# Patient Record
Sex: Male | Born: 1935 | Race: White | Hispanic: No | Marital: Married | Smoking: Former smoker
Health system: Southern US, Community
[De-identification: ages and names within clinical notes are randomized; demographics above are authoritative.]

## PROBLEM LIST (undated history)

## (undated) DIAGNOSIS — M545 Low back pain, unspecified: Secondary | ICD-10-CM

## (undated) DIAGNOSIS — M1990 Unspecified osteoarthritis, unspecified site: Secondary | ICD-10-CM

## (undated) DIAGNOSIS — I447 Left bundle-branch block, unspecified: Secondary | ICD-10-CM

## (undated) DIAGNOSIS — I5030 Unspecified diastolic (congestive) heart failure: Secondary | ICD-10-CM

## (undated) DIAGNOSIS — F329 Major depressive disorder, single episode, unspecified: Secondary | ICD-10-CM

## (undated) DIAGNOSIS — I779 Disorder of arteries and arterioles, unspecified: Secondary | ICD-10-CM

## (undated) DIAGNOSIS — I739 Peripheral vascular disease, unspecified: Secondary | ICD-10-CM

## (undated) DIAGNOSIS — C4490 Unspecified malignant neoplasm of skin, unspecified: Secondary | ICD-10-CM

## (undated) DIAGNOSIS — M67919 Unspecified disorder of synovium and tendon, unspecified shoulder: Secondary | ICD-10-CM

## (undated) DIAGNOSIS — R001 Bradycardia, unspecified: Secondary | ICD-10-CM

## (undated) DIAGNOSIS — K219 Gastro-esophageal reflux disease without esophagitis: Secondary | ICD-10-CM

## (undated) DIAGNOSIS — G5600 Carpal tunnel syndrome, unspecified upper limb: Secondary | ICD-10-CM

## (undated) DIAGNOSIS — R269 Unspecified abnormalities of gait and mobility: Secondary | ICD-10-CM

## (undated) DIAGNOSIS — N179 Acute kidney failure, unspecified: Secondary | ICD-10-CM

## (undated) DIAGNOSIS — I5032 Chronic diastolic (congestive) heart failure: Secondary | ICD-10-CM

## (undated) DIAGNOSIS — Z952 Presence of prosthetic heart valve: Secondary | ICD-10-CM

## (undated) DIAGNOSIS — E7800 Pure hypercholesterolemia, unspecified: Secondary | ICD-10-CM

## (undated) DIAGNOSIS — Z9889 Other specified postprocedural states: Secondary | ICD-10-CM

## (undated) DIAGNOSIS — M109 Gout, unspecified: Secondary | ICD-10-CM

## (undated) DIAGNOSIS — F0390 Unspecified dementia without behavioral disturbance: Secondary | ICD-10-CM

## (undated) DIAGNOSIS — G8929 Other chronic pain: Secondary | ICD-10-CM

## (undated) DIAGNOSIS — I10 Essential (primary) hypertension: Secondary | ICD-10-CM

## (undated) DIAGNOSIS — Z8673 Personal history of transient ischemic attack (TIA), and cerebral infarction without residual deficits: Secondary | ICD-10-CM

## (undated) DIAGNOSIS — R350 Frequency of micturition: Secondary | ICD-10-CM

## (undated) DIAGNOSIS — E559 Vitamin D deficiency, unspecified: Secondary | ICD-10-CM

## (undated) DIAGNOSIS — J439 Emphysema, unspecified: Secondary | ICD-10-CM

## (undated) DIAGNOSIS — F32A Depression, unspecified: Secondary | ICD-10-CM

## (undated) DIAGNOSIS — Z87891 Personal history of nicotine dependence: Secondary | ICD-10-CM

## (undated) DIAGNOSIS — I2510 Atherosclerotic heart disease of native coronary artery without angina pectoris: Secondary | ICD-10-CM

## (undated) DIAGNOSIS — R413 Other amnesia: Secondary | ICD-10-CM

## (undated) DIAGNOSIS — T4145XA Adverse effect of unspecified anesthetic, initial encounter: Secondary | ICD-10-CM

## (undated) HISTORY — PX: CARDIAC VALVE REPLACEMENT: SHX585

## (undated) HISTORY — PX: JOINT REPLACEMENT: SHX530

## (undated) HISTORY — DX: Presence of prosthetic heart valve: Z95.2

## (undated) HISTORY — DX: Disorder of arteries and arterioles, unspecified: I77.9

## (undated) HISTORY — DX: Peripheral vascular disease, unspecified: I73.9

## (undated) HISTORY — PX: BACK SURGERY: SHX140

## (undated) HISTORY — PX: ANTERIOR CERVICAL DECOMP/DISCECTOMY FUSION: SHX1161

---
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
## 1991-05-03 HISTORY — PX: MECHANICAL AORTIC VALVE REPLACEMENT: SHX2013

## 1997-09-04 ENCOUNTER — Encounter: Admission: RE | Admit: 1997-09-04 | Discharge: 1997-09-04 | Payer: Self-pay | Admitting: Family Medicine

## 1999-05-11 ENCOUNTER — Encounter: Admission: RE | Admit: 1999-05-11 | Discharge: 1999-05-11 | Payer: Self-pay | Admitting: Sports Medicine

## 1999-06-07 ENCOUNTER — Encounter: Admission: RE | Admit: 1999-06-07 | Discharge: 1999-06-07 | Payer: Self-pay | Admitting: Family Medicine

## 2001-10-27 ENCOUNTER — Emergency Department (HOSPITAL_COMMUNITY): Admission: EM | Admit: 2001-10-27 | Discharge: 2001-10-27 | Payer: Self-pay | Admitting: Emergency Medicine

## 2001-10-29 ENCOUNTER — Encounter: Admission: RE | Admit: 2001-10-29 | Discharge: 2001-10-29 | Payer: Self-pay | Admitting: Family Medicine

## 2001-11-05 ENCOUNTER — Encounter: Admission: RE | Admit: 2001-11-05 | Discharge: 2001-11-05 | Payer: Self-pay | Admitting: Family Medicine

## 2002-02-01 ENCOUNTER — Encounter: Admission: RE | Admit: 2002-02-01 | Discharge: 2002-02-01 | Payer: Self-pay | Admitting: Family Medicine

## 2002-04-12 ENCOUNTER — Encounter: Admission: RE | Admit: 2002-04-12 | Discharge: 2002-04-12 | Payer: Self-pay | Admitting: Family Medicine

## 2002-10-21 ENCOUNTER — Encounter: Admission: RE | Admit: 2002-10-21 | Discharge: 2002-10-21 | Payer: Self-pay | Admitting: Sports Medicine

## 2002-10-21 ENCOUNTER — Encounter: Admission: RE | Admit: 2002-10-21 | Discharge: 2002-10-21 | Payer: Self-pay | Admitting: Family Medicine

## 2002-10-21 ENCOUNTER — Encounter: Payer: Self-pay | Admitting: Sports Medicine

## 2002-11-15 ENCOUNTER — Encounter: Admission: RE | Admit: 2002-11-15 | Discharge: 2002-11-15 | Payer: Self-pay | Admitting: Family Medicine

## 2002-11-20 ENCOUNTER — Encounter: Admission: RE | Admit: 2002-11-20 | Discharge: 2002-11-20 | Payer: Self-pay | Admitting: Sports Medicine

## 2002-12-20 ENCOUNTER — Encounter: Admission: RE | Admit: 2002-12-20 | Discharge: 2002-12-20 | Payer: Self-pay | Admitting: Family Medicine

## 2002-12-30 ENCOUNTER — Encounter: Admission: RE | Admit: 2002-12-30 | Discharge: 2002-12-30 | Payer: Self-pay | Admitting: Family Medicine

## 2003-01-01 HISTORY — PX: CARPAL TUNNEL RELEASE: SHX101

## 2003-01-07 ENCOUNTER — Encounter: Admission: RE | Admit: 2003-01-07 | Discharge: 2003-01-07 | Payer: Self-pay | Admitting: Family Medicine

## 2003-01-14 ENCOUNTER — Encounter: Admission: RE | Admit: 2003-01-14 | Discharge: 2003-01-14 | Payer: Self-pay | Admitting: Family Medicine

## 2003-01-28 ENCOUNTER — Encounter: Payer: Self-pay | Admitting: Orthopedic Surgery

## 2003-01-28 ENCOUNTER — Encounter: Admission: RE | Admit: 2003-01-28 | Discharge: 2003-01-28 | Payer: Self-pay | Admitting: Orthopedic Surgery

## 2003-01-30 ENCOUNTER — Ambulatory Visit (HOSPITAL_BASED_OUTPATIENT_CLINIC_OR_DEPARTMENT_OTHER): Admission: RE | Admit: 2003-01-30 | Discharge: 2003-01-30 | Payer: Self-pay | Admitting: Orthopedic Surgery

## 2003-01-30 ENCOUNTER — Ambulatory Visit (HOSPITAL_COMMUNITY): Admission: RE | Admit: 2003-01-30 | Discharge: 2003-01-30 | Payer: Self-pay | Admitting: Orthopedic Surgery

## 2003-02-21 ENCOUNTER — Encounter: Admission: RE | Admit: 2003-02-21 | Discharge: 2003-02-21 | Payer: Self-pay | Admitting: Family Medicine

## 2003-05-05 ENCOUNTER — Encounter: Admission: RE | Admit: 2003-05-05 | Discharge: 2003-05-05 | Payer: Self-pay | Admitting: Sports Medicine

## 2003-05-05 ENCOUNTER — Encounter: Admission: RE | Admit: 2003-05-05 | Discharge: 2003-05-05 | Payer: Self-pay | Admitting: Pediatrics

## 2003-06-09 ENCOUNTER — Encounter: Admission: RE | Admit: 2003-06-09 | Discharge: 2003-06-09 | Payer: Self-pay | Admitting: Family Medicine

## 2003-08-29 ENCOUNTER — Encounter: Admission: RE | Admit: 2003-08-29 | Discharge: 2003-08-29 | Payer: Self-pay | Admitting: Family Medicine

## 2003-09-05 ENCOUNTER — Encounter: Admission: RE | Admit: 2003-09-05 | Discharge: 2003-09-05 | Payer: Self-pay | Admitting: Family Medicine

## 2003-09-15 ENCOUNTER — Encounter: Admission: RE | Admit: 2003-09-15 | Discharge: 2003-09-15 | Payer: Self-pay | Admitting: Family Medicine

## 2003-09-24 ENCOUNTER — Inpatient Hospital Stay (HOSPITAL_COMMUNITY): Admission: RE | Admit: 2003-09-24 | Discharge: 2003-09-27 | Payer: Self-pay | Admitting: Neurosurgery

## 2003-09-28 ENCOUNTER — Emergency Department (HOSPITAL_COMMUNITY): Admission: EM | Admit: 2003-09-28 | Discharge: 2003-09-28 | Payer: Self-pay | Admitting: Emergency Medicine

## 2003-09-30 ENCOUNTER — Encounter: Admission: RE | Admit: 2003-09-30 | Discharge: 2003-09-30 | Payer: Self-pay | Admitting: Family Medicine

## 2003-10-06 ENCOUNTER — Encounter: Admission: RE | Admit: 2003-10-06 | Discharge: 2003-10-06 | Payer: Self-pay | Admitting: Family Medicine

## 2003-10-28 ENCOUNTER — Emergency Department (HOSPITAL_COMMUNITY): Admission: EM | Admit: 2003-10-28 | Discharge: 2003-10-29 | Payer: Self-pay | Admitting: Emergency Medicine

## 2003-11-14 ENCOUNTER — Encounter: Admission: RE | Admit: 2003-11-14 | Discharge: 2003-12-16 | Payer: Self-pay | Admitting: Neurosurgery

## 2004-02-16 ENCOUNTER — Ambulatory Visit: Payer: Self-pay | Admitting: Family Medicine

## 2004-03-02 ENCOUNTER — Encounter: Admission: RE | Admit: 2004-03-02 | Discharge: 2004-03-30 | Payer: Self-pay | Admitting: Family Medicine

## 2004-03-03 ENCOUNTER — Ambulatory Visit: Payer: Self-pay | Admitting: *Deleted

## 2004-03-12 ENCOUNTER — Ambulatory Visit: Payer: Self-pay | Admitting: Cardiology

## 2004-04-02 ENCOUNTER — Ambulatory Visit: Payer: Self-pay | Admitting: Cardiology

## 2004-04-05 ENCOUNTER — Ambulatory Visit: Payer: Self-pay | Admitting: Family Medicine

## 2004-04-30 ENCOUNTER — Ambulatory Visit: Payer: Self-pay | Admitting: Internal Medicine

## 2004-05-10 ENCOUNTER — Ambulatory Visit: Payer: Self-pay | Admitting: Family Medicine

## 2004-05-28 ENCOUNTER — Ambulatory Visit: Payer: Self-pay | Admitting: Cardiology

## 2004-06-16 ENCOUNTER — Ambulatory Visit: Payer: Self-pay | Admitting: Cardiovascular Disease

## 2004-06-30 ENCOUNTER — Ambulatory Visit: Payer: Self-pay | Admitting: Cardiology

## 2004-07-09 ENCOUNTER — Ambulatory Visit: Payer: Self-pay | Admitting: Family Medicine

## 2004-07-21 ENCOUNTER — Ambulatory Visit: Payer: Self-pay | Admitting: Cardiology

## 2004-08-06 ENCOUNTER — Ambulatory Visit: Payer: Self-pay | Admitting: Family Medicine

## 2004-08-11 ENCOUNTER — Ambulatory Visit: Payer: Self-pay | Admitting: Internal Medicine

## 2004-08-25 ENCOUNTER — Ambulatory Visit: Payer: Self-pay | Admitting: Cardiology

## 2004-09-03 ENCOUNTER — Ambulatory Visit: Payer: Self-pay | Admitting: Family Medicine

## 2004-09-14 ENCOUNTER — Ambulatory Visit: Payer: Self-pay | Admitting: Family Medicine

## 2004-09-22 ENCOUNTER — Ambulatory Visit: Payer: Self-pay | Admitting: *Deleted

## 2004-10-20 ENCOUNTER — Ambulatory Visit: Payer: Self-pay | Admitting: *Deleted

## 2004-11-10 ENCOUNTER — Ambulatory Visit: Payer: Self-pay | Admitting: Cardiology

## 2004-12-08 ENCOUNTER — Ambulatory Visit: Payer: Self-pay | Admitting: Cardiology

## 2004-12-29 ENCOUNTER — Ambulatory Visit: Payer: Self-pay | Admitting: *Deleted

## 2005-01-11 ENCOUNTER — Ambulatory Visit: Payer: Self-pay | Admitting: Cardiology

## 2005-01-26 ENCOUNTER — Ambulatory Visit: Payer: Self-pay | Admitting: Cardiology

## 2005-01-31 ENCOUNTER — Encounter: Admission: RE | Admit: 2005-01-31 | Discharge: 2005-01-31 | Payer: Self-pay | Admitting: Family Medicine

## 2005-01-31 ENCOUNTER — Ambulatory Visit: Payer: Self-pay | Admitting: Family Medicine

## 2005-02-23 ENCOUNTER — Ambulatory Visit: Payer: Self-pay | Admitting: Cardiology

## 2005-03-23 ENCOUNTER — Ambulatory Visit: Payer: Self-pay | Admitting: Cardiology

## 2005-03-23 ENCOUNTER — Ambulatory Visit: Payer: Self-pay | Admitting: Family Medicine

## 2005-04-06 ENCOUNTER — Ambulatory Visit: Payer: Self-pay | Admitting: *Deleted

## 2005-04-15 ENCOUNTER — Ambulatory Visit: Payer: Self-pay | Admitting: Family Medicine

## 2005-04-27 ENCOUNTER — Ambulatory Visit: Payer: Self-pay | Admitting: *Deleted

## 2005-05-25 ENCOUNTER — Ambulatory Visit: Payer: Self-pay | Admitting: Cardiology

## 2005-06-21 ENCOUNTER — Ambulatory Visit: Payer: Self-pay | Admitting: Cardiology

## 2005-06-22 ENCOUNTER — Ambulatory Visit: Payer: Self-pay | Admitting: Cardiology

## 2005-07-06 ENCOUNTER — Ambulatory Visit: Payer: Self-pay | Admitting: Internal Medicine

## 2005-07-19 ENCOUNTER — Encounter: Admission: RE | Admit: 2005-07-19 | Discharge: 2005-07-19 | Payer: Self-pay | Admitting: Neurology

## 2005-07-27 ENCOUNTER — Ambulatory Visit: Payer: Self-pay | Admitting: *Deleted

## 2005-08-24 ENCOUNTER — Ambulatory Visit: Payer: Self-pay | Admitting: *Deleted

## 2005-09-21 ENCOUNTER — Ambulatory Visit: Payer: Self-pay | Admitting: Cardiology

## 2005-10-05 ENCOUNTER — Ambulatory Visit: Payer: Self-pay | Admitting: Cardiology

## 2005-10-07 ENCOUNTER — Encounter: Admission: RE | Admit: 2005-10-07 | Discharge: 2005-10-07 | Payer: Self-pay | Admitting: Sports Medicine

## 2005-10-07 ENCOUNTER — Ambulatory Visit: Payer: Self-pay | Admitting: Family Medicine

## 2005-11-01 ENCOUNTER — Ambulatory Visit: Payer: Self-pay | Admitting: Internal Medicine

## 2005-11-14 ENCOUNTER — Ambulatory Visit: Payer: Self-pay | Admitting: Cardiology

## 2005-11-30 ENCOUNTER — Ambulatory Visit: Payer: Self-pay | Admitting: *Deleted

## 2005-12-28 ENCOUNTER — Ambulatory Visit: Payer: Self-pay | Admitting: Cardiology

## 2006-01-11 ENCOUNTER — Ambulatory Visit: Payer: Self-pay | Admitting: Cardiovascular Disease

## 2006-01-13 ENCOUNTER — Ambulatory Visit: Payer: Self-pay | Admitting: Family Medicine

## 2006-02-01 ENCOUNTER — Ambulatory Visit: Payer: Self-pay | Admitting: Internal Medicine

## 2006-03-01 ENCOUNTER — Ambulatory Visit: Payer: Self-pay | Admitting: Cardiology

## 2006-03-15 ENCOUNTER — Ambulatory Visit: Payer: Self-pay | Admitting: Cardiology

## 2006-03-21 ENCOUNTER — Ambulatory Visit (HOSPITAL_COMMUNITY): Admission: RE | Admit: 2006-03-21 | Discharge: 2006-03-21 | Payer: Self-pay | Admitting: Family Medicine

## 2006-03-21 ENCOUNTER — Ambulatory Visit: Payer: Self-pay | Admitting: Family Medicine

## 2006-03-29 ENCOUNTER — Ambulatory Visit: Payer: Self-pay | Admitting: Cardiovascular Disease

## 2006-04-17 ENCOUNTER — Ambulatory Visit: Payer: Self-pay | Admitting: Cardiology

## 2006-04-21 ENCOUNTER — Ambulatory Visit: Payer: Self-pay | Admitting: Family Medicine

## 2006-05-01 ENCOUNTER — Ambulatory Visit: Payer: Self-pay | Admitting: Cardiovascular Disease

## 2006-05-11 ENCOUNTER — Ambulatory Visit: Payer: Self-pay | Admitting: Cardiology

## 2006-05-31 ENCOUNTER — Ambulatory Visit: Payer: Self-pay | Admitting: Cardiology

## 2006-06-01 ENCOUNTER — Ambulatory Visit: Payer: Self-pay | Admitting: Cardiology

## 2006-06-01 ENCOUNTER — Ambulatory Visit: Payer: Self-pay

## 2006-06-01 ENCOUNTER — Encounter: Payer: Self-pay | Admitting: Cardiology

## 2006-06-19 ENCOUNTER — Ambulatory Visit: Payer: Self-pay | Admitting: Family Medicine

## 2006-06-19 ENCOUNTER — Encounter: Admission: RE | Admit: 2006-06-19 | Discharge: 2006-06-19 | Payer: Self-pay | Admitting: Family Medicine

## 2006-06-29 ENCOUNTER — Ambulatory Visit: Payer: Self-pay | Admitting: *Deleted

## 2006-06-29 DIAGNOSIS — M719 Bursopathy, unspecified: Secondary | ICD-10-CM

## 2006-06-29 DIAGNOSIS — G20 Parkinson's disease: Secondary | ICD-10-CM

## 2006-06-29 DIAGNOSIS — M545 Low back pain, unspecified: Secondary | ICD-10-CM

## 2006-06-29 DIAGNOSIS — M67919 Unspecified disorder of synovium and tendon, unspecified shoulder: Secondary | ICD-10-CM | POA: Insufficient documentation

## 2006-06-29 DIAGNOSIS — C4490 Unspecified malignant neoplasm of skin, unspecified: Secondary | ICD-10-CM

## 2006-06-29 DIAGNOSIS — K219 Gastro-esophageal reflux disease without esophagitis: Secondary | ICD-10-CM

## 2006-06-29 DIAGNOSIS — Z87891 Personal history of nicotine dependence: Secondary | ICD-10-CM

## 2006-06-29 DIAGNOSIS — I10 Essential (primary) hypertension: Secondary | ICD-10-CM

## 2006-06-29 DIAGNOSIS — E7800 Pure hypercholesterolemia, unspecified: Secondary | ICD-10-CM

## 2006-06-29 DIAGNOSIS — E669 Obesity, unspecified: Secondary | ICD-10-CM | POA: Insufficient documentation

## 2006-06-29 DIAGNOSIS — G5600 Carpal tunnel syndrome, unspecified upper limb: Secondary | ICD-10-CM

## 2006-06-29 DIAGNOSIS — N400 Enlarged prostate without lower urinary tract symptoms: Secondary | ICD-10-CM

## 2006-06-29 DIAGNOSIS — M109 Gout, unspecified: Secondary | ICD-10-CM

## 2006-06-29 HISTORY — DX: Low back pain, unspecified: M54.50

## 2006-06-29 HISTORY — DX: Personal history of nicotine dependence: Z87.891

## 2006-06-29 HISTORY — DX: Carpal tunnel syndrome, unspecified upper limb: G56.00

## 2006-06-29 HISTORY — DX: Gastro-esophageal reflux disease without esophagitis: K21.9

## 2006-06-29 HISTORY — DX: Gout, unspecified: M10.9

## 2006-06-29 HISTORY — DX: Unspecified disorder of synovium and tendon, unspecified shoulder: M67.919

## 2006-06-29 HISTORY — DX: Unspecified malignant neoplasm of skin, unspecified: C44.90

## 2006-07-20 ENCOUNTER — Ambulatory Visit: Payer: Self-pay | Admitting: Cardiology

## 2006-07-25 ENCOUNTER — Ambulatory Visit: Payer: Self-pay | Admitting: Cardiology

## 2006-07-25 ENCOUNTER — Encounter: Admission: RE | Admit: 2006-07-25 | Discharge: 2006-07-25 | Payer: Self-pay | Admitting: Sports Medicine

## 2006-08-01 ENCOUNTER — Ambulatory Visit: Payer: Self-pay | Admitting: *Deleted

## 2006-08-09 ENCOUNTER — Ambulatory Visit: Payer: Self-pay | Admitting: Internal Medicine

## 2006-08-23 ENCOUNTER — Ambulatory Visit: Payer: Self-pay | Admitting: *Deleted

## 2006-09-18 ENCOUNTER — Ambulatory Visit: Payer: Self-pay | Admitting: Family Medicine

## 2006-09-18 DIAGNOSIS — M159 Polyosteoarthritis, unspecified: Secondary | ICD-10-CM

## 2006-09-20 ENCOUNTER — Encounter (INDEPENDENT_AMBULATORY_CARE_PROVIDER_SITE_OTHER): Payer: Self-pay | Admitting: Family Medicine

## 2006-09-20 ENCOUNTER — Ambulatory Visit: Payer: Self-pay | Admitting: Cardiology

## 2006-09-27 ENCOUNTER — Encounter: Admission: RE | Admit: 2006-09-27 | Discharge: 2006-10-31 | Payer: Self-pay | Admitting: Family Medicine

## 2006-10-11 ENCOUNTER — Ambulatory Visit: Payer: Self-pay | Admitting: Cardiology

## 2006-10-25 ENCOUNTER — Ambulatory Visit: Payer: Self-pay | Admitting: Internal Medicine

## 2006-11-02 ENCOUNTER — Encounter: Payer: Self-pay | Admitting: Family Medicine

## 2006-11-15 ENCOUNTER — Ambulatory Visit: Payer: Self-pay | Admitting: Internal Medicine

## 2006-11-17 ENCOUNTER — Telehealth: Payer: Self-pay | Admitting: *Deleted

## 2006-12-04 ENCOUNTER — Ambulatory Visit: Payer: Self-pay | Admitting: Internal Medicine

## 2006-12-15 ENCOUNTER — Ambulatory Visit: Payer: Self-pay | Admitting: Internal Medicine

## 2006-12-28 ENCOUNTER — Ambulatory Visit: Payer: Self-pay | Admitting: Cardiology

## 2007-01-25 ENCOUNTER — Ambulatory Visit: Payer: Self-pay | Admitting: Cardiology

## 2007-02-12 ENCOUNTER — Telehealth: Payer: Self-pay | Admitting: Family Medicine

## 2007-02-15 ENCOUNTER — Ambulatory Visit: Payer: Self-pay | Admitting: Cardiology

## 2007-03-14 ENCOUNTER — Ambulatory Visit: Payer: Self-pay | Admitting: Cardiology

## 2007-03-16 ENCOUNTER — Encounter: Payer: Self-pay | Admitting: Family Medicine

## 2007-04-06 ENCOUNTER — Ambulatory Visit: Payer: Self-pay | Admitting: Family Medicine

## 2007-04-06 LAB — CONVERTED CEMR LAB
Alkaline Phosphatase: 53 units/L (ref 39–117)
BUN: 41 mg/dL — ABNORMAL HIGH (ref 6–23)
CO2: 23 meq/L (ref 19–32)
Cholesterol: 163 mg/dL (ref 0–200)
Creatinine, Ser: 1.38 mg/dL (ref 0.40–1.50)
Glucose, Bld: 97 mg/dL (ref 70–99)
HCT: 43.5 % (ref 39.0–52.0)
HDL: 34 mg/dL — ABNORMAL LOW (ref >39)
Hemoglobin: 14.4 g/dL (ref 13.0–17.0)
MCHC: 33.1 g/dL (ref 30.0–36.0)
MCV: 95.4 fL (ref 78.0–100.0)
RBC: 4.56 M/uL (ref 4.22–5.81)
Total Bilirubin: 0.8 mg/dL (ref 0.3–1.2)
Total CHOL/HDL Ratio: 4.8
Total Protein: 7.4 g/dL (ref 6.0–8.3)
Triglycerides: 253 mg/dL — ABNORMAL HIGH (ref <150)
VLDL: 51 mg/dL — ABNORMAL HIGH (ref 0–40)
WBC: 7.6 10*3/uL (ref 4.0–10.5)

## 2007-04-11 ENCOUNTER — Ambulatory Visit: Payer: Self-pay | Admitting: Cardiovascular Disease

## 2007-05-10 ENCOUNTER — Ambulatory Visit: Payer: Self-pay | Admitting: Cardiology

## 2007-05-10 ENCOUNTER — Ambulatory Visit: Payer: Self-pay | Admitting: Internal Medicine

## 2007-05-14 ENCOUNTER — Telehealth: Payer: Self-pay | Admitting: Family Medicine

## 2007-05-16 ENCOUNTER — Ambulatory Visit: Payer: Self-pay | Admitting: Cardiology

## 2007-05-16 LAB — CONVERTED CEMR LAB
Albumin: 3.8 g/dL (ref 3.5–5.2)
Alkaline Phosphatase: 46 units/L (ref 39–117)
BUN: 30 mg/dL — ABNORMAL HIGH (ref 6–23)
Basophils Absolute: 0.1 10*3/uL (ref 0.0–0.1)
Creatinine, Ser: 1.4 mg/dL (ref 0.4–1.5)
Eosinophils Absolute: 0.5 10*3/uL (ref 0.0–0.6)
GFR calc Af Amer: 64 mL/min
GFR calc non Af Amer: 53 mL/min
HDL: 25.4 mg/dL — ABNORMAL LOW (ref >39.0)
Hemoglobin: 13.9 g/dL (ref 13.0–17.0)
LDL Cholesterol: 55 mg/dL (ref 0–99)
MCHC: 34.2 g/dL (ref 30.0–36.0)
MCV: 94.4 fL (ref 78.0–100.0)
Monocytes Absolute: 0.8 10*3/uL — ABNORMAL HIGH (ref 0.2–0.7)
Monocytes Relative: 10.5 % (ref 3.0–11.0)
Potassium: 5.2 meq/L — ABNORMAL HIGH (ref 3.5–5.1)
RDW: 13.6 % (ref 11.5–14.6)
Sodium: 138 meq/L (ref 135–145)
Total Bilirubin: 0.9 mg/dL (ref 0.3–1.2)
Total CHOL/HDL Ratio: 4.3

## 2007-05-25 ENCOUNTER — Ambulatory Visit: Payer: Self-pay | Admitting: Family Medicine

## 2007-05-25 DIAGNOSIS — F0390 Unspecified dementia without behavioral disturbance: Secondary | ICD-10-CM

## 2007-06-01 ENCOUNTER — Encounter: Payer: Self-pay | Admitting: Family Medicine

## 2007-06-07 ENCOUNTER — Ambulatory Visit: Payer: Self-pay | Admitting: Cardiology

## 2007-06-22 ENCOUNTER — Ambulatory Visit: Payer: Self-pay | Admitting: Family Medicine

## 2007-07-04 ENCOUNTER — Telehealth: Payer: Self-pay | Admitting: *Deleted

## 2007-07-05 ENCOUNTER — Ambulatory Visit: Payer: Self-pay | Admitting: Cardiovascular Disease

## 2007-07-30 ENCOUNTER — Encounter: Payer: Self-pay | Admitting: Family Medicine

## 2007-08-02 ENCOUNTER — Ambulatory Visit: Payer: Self-pay | Admitting: Internal Medicine

## 2007-08-17 ENCOUNTER — Telehealth: Payer: Self-pay | Admitting: Family Medicine

## 2007-08-30 ENCOUNTER — Ambulatory Visit: Payer: Self-pay | Admitting: Cardiology

## 2007-09-25 ENCOUNTER — Encounter: Payer: Self-pay | Admitting: Family Medicine

## 2007-09-26 ENCOUNTER — Encounter: Payer: Self-pay | Admitting: Family Medicine

## 2007-09-27 ENCOUNTER — Ambulatory Visit: Payer: Self-pay | Admitting: Cardiology

## 2007-10-08 ENCOUNTER — Ambulatory Visit: Payer: Self-pay | Admitting: Cardiology

## 2007-11-05 ENCOUNTER — Ambulatory Visit: Payer: Self-pay | Admitting: Internal Medicine

## 2007-11-13 LAB — CONVERTED CEMR LAB
PSA: 0.67 ng/mL
PSA: 0.67 ng/mL
PSA: 0.67 ng/mL
PSA: 0.67 ng/mL

## 2007-11-19 ENCOUNTER — Telehealth: Payer: Self-pay | Admitting: *Deleted

## 2007-12-03 ENCOUNTER — Ambulatory Visit: Payer: Self-pay | Admitting: Cardiology

## 2007-12-05 ENCOUNTER — Ambulatory Visit: Payer: Self-pay | Admitting: Family Medicine

## 2007-12-14 ENCOUNTER — Encounter: Payer: Self-pay | Admitting: Family Medicine

## 2007-12-14 LAB — CONVERTED CEMR LAB
OCCULT 1: NEGATIVE
OCCULT 2: NEGATIVE

## 2007-12-31 ENCOUNTER — Ambulatory Visit: Payer: Self-pay | Admitting: Cardiology

## 2008-01-15 ENCOUNTER — Ambulatory Visit: Payer: Self-pay | Admitting: Cardiology

## 2008-01-17 ENCOUNTER — Encounter: Payer: Self-pay | Admitting: Family Medicine

## 2008-01-22 ENCOUNTER — Encounter: Payer: Self-pay | Admitting: Family Medicine

## 2008-01-22 ENCOUNTER — Ambulatory Visit: Payer: Self-pay

## 2008-01-28 ENCOUNTER — Ambulatory Visit: Payer: Self-pay | Admitting: Cardiovascular Disease

## 2008-02-07 ENCOUNTER — Encounter: Admission: RE | Admit: 2008-02-07 | Discharge: 2008-02-07 | Payer: Self-pay | Admitting: General Surgery

## 2008-02-15 ENCOUNTER — Ambulatory Visit: Payer: Self-pay | Admitting: Cardiology

## 2008-02-25 ENCOUNTER — Ambulatory Visit: Payer: Self-pay | Admitting: Cardiology

## 2008-03-04 ENCOUNTER — Ambulatory Visit: Payer: Self-pay | Admitting: Internal Medicine

## 2008-03-20 ENCOUNTER — Encounter: Payer: Self-pay | Admitting: Family Medicine

## 2008-04-01 ENCOUNTER — Ambulatory Visit: Payer: Self-pay | Admitting: Cardiovascular Disease

## 2008-04-01 ENCOUNTER — Telehealth: Payer: Self-pay | Admitting: Family Medicine

## 2008-04-29 ENCOUNTER — Ambulatory Visit: Payer: Self-pay | Admitting: Cardiovascular Disease

## 2008-05-23 ENCOUNTER — Telehealth: Payer: Self-pay | Admitting: *Deleted

## 2008-05-27 ENCOUNTER — Ambulatory Visit: Payer: Self-pay | Admitting: Cardiology

## 2008-06-11 ENCOUNTER — Ambulatory Visit: Payer: Self-pay | Admitting: Family Medicine

## 2008-06-12 LAB — CONVERTED CEMR LAB
AST: 23 units/L (ref 0–37)
Albumin: 4.4 g/dL (ref 3.5–5.2)
Alkaline Phosphatase: 49 units/L (ref 39–117)
Glucose, Bld: 89 mg/dL (ref 70–99)
LDL Cholesterol: 167 mg/dL — ABNORMAL HIGH (ref 0–99)
MCHC: 33.9 g/dL (ref 30.0–36.0)
MCV: 93.1 fL (ref 78.0–100.0)
Platelets: 205 10*3/uL (ref 150–400)
Potassium: 5.4 meq/L — ABNORMAL HIGH (ref 3.5–5.3)
RDW: 14.3 % (ref 11.5–15.5)
Sodium: 138 meq/L (ref 135–145)
Total Bilirubin: 1 mg/dL (ref 0.3–1.2)
Total Protein: 7.5 g/dL (ref 6.0–8.3)
Triglycerides: 325 mg/dL — ABNORMAL HIGH (ref <150)
VLDL: 65 mg/dL — ABNORMAL HIGH (ref 0–40)

## 2008-06-24 ENCOUNTER — Ambulatory Visit: Payer: Self-pay | Admitting: Cardiovascular Disease

## 2008-07-08 ENCOUNTER — Ambulatory Visit: Payer: Self-pay | Admitting: Cardiology

## 2008-07-08 ENCOUNTER — Telehealth: Payer: Self-pay | Admitting: Family Medicine

## 2008-07-15 ENCOUNTER — Telehealth: Payer: Self-pay | Admitting: Family Medicine

## 2008-07-24 ENCOUNTER — Ambulatory Visit: Payer: Self-pay | Admitting: Family Medicine

## 2008-07-24 ENCOUNTER — Telehealth: Payer: Self-pay | Admitting: Family Medicine

## 2008-07-24 ENCOUNTER — Encounter: Payer: Self-pay | Admitting: Family Medicine

## 2008-07-25 LAB — CONVERTED CEMR LAB
CO2: 19 meq/L (ref 19–32)
Calcium: 9 mg/dL (ref 8.4–10.5)
Chloride: 108 meq/L (ref 96–112)
Creatinine, Ser: 1.63 mg/dL — ABNORMAL HIGH (ref 0.40–1.50)
Sodium: 138 meq/L (ref 135–145)

## 2008-07-29 ENCOUNTER — Ambulatory Visit: Payer: Self-pay | Admitting: Cardiology

## 2008-08-06 ENCOUNTER — Ambulatory Visit: Payer: Self-pay | Admitting: Family Medicine

## 2008-08-06 DIAGNOSIS — N183 Chronic kidney disease, stage 3 (moderate): Secondary | ICD-10-CM

## 2008-08-07 LAB — CONVERTED CEMR LAB
BUN: 46 mg/dL — ABNORMAL HIGH (ref 6–23)
CO2: 19 meq/L (ref 19–32)
Chloride: 110 meq/L (ref 96–112)
Creatinine, Ser: 1.87 mg/dL — ABNORMAL HIGH (ref 0.40–1.50)
Potassium: 5 meq/L (ref 3.5–5.3)

## 2008-08-12 ENCOUNTER — Ambulatory Visit: Payer: Self-pay | Admitting: Cardiology

## 2008-08-21 ENCOUNTER — Ambulatory Visit: Payer: Self-pay | Admitting: Internal Medicine

## 2008-08-27 ENCOUNTER — Ambulatory Visit: Payer: Self-pay | Admitting: Family Medicine

## 2008-08-27 LAB — CONVERTED CEMR LAB
BUN: 24 mg/dL — ABNORMAL HIGH (ref 6–23)
Chloride: 109 meq/L (ref 96–112)
Creatinine, Ser: 1.42 mg/dL (ref 0.40–1.50)
Glucose, Bld: 93 mg/dL (ref 70–99)
Potassium: 5.3 meq/L (ref 3.5–5.3)

## 2008-09-04 ENCOUNTER — Ambulatory Visit: Payer: Self-pay | Admitting: Cardiology

## 2008-09-18 ENCOUNTER — Ambulatory Visit: Payer: Self-pay | Admitting: Internal Medicine

## 2008-09-30 ENCOUNTER — Encounter: Payer: Self-pay | Admitting: *Deleted

## 2008-10-08 ENCOUNTER — Ambulatory Visit: Payer: Self-pay | Admitting: Family Medicine

## 2008-10-09 ENCOUNTER — Ambulatory Visit: Payer: Self-pay | Admitting: Cardiology

## 2008-10-20 ENCOUNTER — Encounter (INDEPENDENT_AMBULATORY_CARE_PROVIDER_SITE_OTHER): Payer: Self-pay | Admitting: Cardiology

## 2008-10-20 ENCOUNTER — Ambulatory Visit: Payer: Self-pay | Admitting: Internal Medicine

## 2008-10-20 LAB — CONVERTED CEMR LAB: POC INR: 1.9

## 2008-11-04 ENCOUNTER — Telehealth: Payer: Self-pay | Admitting: Family Medicine

## 2008-11-05 ENCOUNTER — Encounter: Payer: Self-pay | Admitting: *Deleted

## 2008-11-10 ENCOUNTER — Encounter (INDEPENDENT_AMBULATORY_CARE_PROVIDER_SITE_OTHER): Payer: Self-pay | Admitting: Cardiology

## 2008-11-10 ENCOUNTER — Ambulatory Visit: Payer: Self-pay | Admitting: Cardiology

## 2008-11-10 LAB — CONVERTED CEMR LAB
POC INR: 1.8
Prothrombin Time: 16.5 s

## 2008-11-24 ENCOUNTER — Ambulatory Visit: Payer: Self-pay | Admitting: Cardiology

## 2008-11-24 LAB — CONVERTED CEMR LAB
POC INR: 2
Prothrombin Time: 17.3 s

## 2008-12-05 ENCOUNTER — Ambulatory Visit: Payer: Self-pay | Admitting: Cardiology

## 2008-12-05 LAB — CONVERTED CEMR LAB: Prothrombin Time: 24.4 s

## 2008-12-19 ENCOUNTER — Ambulatory Visit: Payer: Self-pay | Admitting: Cardiology

## 2008-12-31 DIAGNOSIS — Z9889 Other specified postprocedural states: Secondary | ICD-10-CM

## 2008-12-31 HISTORY — DX: Other specified postprocedural states: Z98.890

## 2009-01-01 ENCOUNTER — Ambulatory Visit: Payer: Self-pay | Admitting: Cardiology

## 2009-01-09 ENCOUNTER — Ambulatory Visit: Payer: Self-pay | Admitting: Internal Medicine

## 2009-01-21 ENCOUNTER — Ambulatory Visit: Payer: Self-pay | Admitting: Family Medicine

## 2009-02-06 ENCOUNTER — Ambulatory Visit: Payer: Self-pay | Admitting: Internal Medicine

## 2009-02-10 ENCOUNTER — Telehealth: Payer: Self-pay | Admitting: *Deleted

## 2009-02-11 ENCOUNTER — Ambulatory Visit: Payer: Self-pay | Admitting: Family Medicine

## 2009-03-05 ENCOUNTER — Ambulatory Visit: Payer: Self-pay | Admitting: Internal Medicine

## 2009-03-05 LAB — CONVERTED CEMR LAB: POC INR: 2.9

## 2009-04-02 ENCOUNTER — Encounter (INDEPENDENT_AMBULATORY_CARE_PROVIDER_SITE_OTHER): Payer: Self-pay | Admitting: Cardiology

## 2009-04-02 ENCOUNTER — Ambulatory Visit: Payer: Self-pay | Admitting: Cardiovascular Disease

## 2009-04-14 ENCOUNTER — Encounter: Payer: Self-pay | Admitting: Family Medicine

## 2009-04-30 ENCOUNTER — Ambulatory Visit: Payer: Self-pay | Admitting: Internal Medicine

## 2009-05-27 ENCOUNTER — Ambulatory Visit: Payer: Self-pay | Admitting: Family Medicine

## 2009-05-28 ENCOUNTER — Ambulatory Visit: Payer: Self-pay | Admitting: Internal Medicine

## 2009-05-28 LAB — CONVERTED CEMR LAB
AST: 21 units/L (ref 0–37)
Albumin: 4.8 g/dL (ref 3.5–5.2)
BUN: 24 mg/dL — ABNORMAL HIGH (ref 6–23)
CO2: 24 meq/L (ref 19–32)
Calcium: 9.6 mg/dL (ref 8.4–10.5)
Chloride: 103 meq/L (ref 96–112)
Cholesterol: 140 mg/dL (ref 0–200)
Glucose, Bld: 92 mg/dL (ref 70–99)
HDL: 39 mg/dL — ABNORMAL LOW (ref >39)
POC INR: 3.1
Potassium: 4.8 meq/L (ref 3.5–5.3)

## 2009-06-25 ENCOUNTER — Ambulatory Visit: Payer: Self-pay | Admitting: Cardiology

## 2009-06-25 ENCOUNTER — Encounter: Payer: Self-pay | Admitting: Family Medicine

## 2009-06-26 ENCOUNTER — Telehealth (INDEPENDENT_AMBULATORY_CARE_PROVIDER_SITE_OTHER): Payer: Self-pay | Admitting: *Deleted

## 2009-07-16 ENCOUNTER — Ambulatory Visit: Payer: Self-pay | Admitting: Internal Medicine

## 2009-08-13 ENCOUNTER — Ambulatory Visit: Payer: Self-pay | Admitting: Internal Medicine

## 2009-08-28 ENCOUNTER — Ambulatory Visit: Payer: Self-pay | Admitting: Family Medicine

## 2009-09-10 ENCOUNTER — Ambulatory Visit: Payer: Self-pay | Admitting: Internal Medicine

## 2009-09-10 LAB — CONVERTED CEMR LAB: POC INR: 3

## 2009-09-14 ENCOUNTER — Telehealth: Payer: Self-pay | Admitting: *Deleted

## 2009-09-30 ENCOUNTER — Encounter: Payer: Self-pay | Admitting: Family Medicine

## 2009-10-08 ENCOUNTER — Ambulatory Visit: Payer: Self-pay | Admitting: Internal Medicine

## 2009-10-08 LAB — CONVERTED CEMR LAB: POC INR: 2.9

## 2009-10-23 ENCOUNTER — Ambulatory Visit: Payer: Self-pay | Admitting: Family Medicine

## 2009-11-05 ENCOUNTER — Ambulatory Visit: Payer: Self-pay | Admitting: Internal Medicine

## 2009-11-05 LAB — CONVERTED CEMR LAB: POC INR: 3.3

## 2009-11-09 ENCOUNTER — Telehealth: Payer: Self-pay | Admitting: Family Medicine

## 2009-11-11 ENCOUNTER — Ambulatory Visit: Payer: Self-pay | Admitting: Family Medicine

## 2009-11-20 ENCOUNTER — Ambulatory Visit: Payer: Self-pay | Admitting: Family Medicine

## 2009-11-20 DIAGNOSIS — M7660 Achilles tendinitis, unspecified leg: Secondary | ICD-10-CM

## 2009-12-03 ENCOUNTER — Ambulatory Visit: Payer: Self-pay | Admitting: Cardiovascular Disease

## 2009-12-31 ENCOUNTER — Ambulatory Visit: Payer: Self-pay | Admitting: Internal Medicine

## 2009-12-31 LAB — CONVERTED CEMR LAB: POC INR: 2.4

## 2010-01-11 ENCOUNTER — Encounter: Payer: Self-pay | Admitting: Family Medicine

## 2010-01-13 ENCOUNTER — Encounter: Payer: Self-pay | Admitting: Family Medicine

## 2010-01-18 ENCOUNTER — Encounter: Payer: Self-pay | Admitting: Cardiology

## 2010-01-19 ENCOUNTER — Encounter: Payer: Self-pay | Admitting: Cardiology

## 2010-01-20 ENCOUNTER — Ambulatory Visit: Payer: Self-pay | Admitting: Cardiology

## 2010-01-20 ENCOUNTER — Ambulatory Visit: Payer: Self-pay

## 2010-01-20 ENCOUNTER — Ambulatory Visit (HOSPITAL_COMMUNITY): Admission: RE | Admit: 2010-01-20 | Discharge: 2010-01-20 | Payer: Self-pay | Admitting: Cardiology

## 2010-01-20 ENCOUNTER — Encounter: Payer: Self-pay | Admitting: Cardiology

## 2010-01-20 DIAGNOSIS — I714 Abdominal aortic aneurysm, without rupture: Secondary | ICD-10-CM

## 2010-01-21 ENCOUNTER — Encounter: Payer: Self-pay | Admitting: Cardiology

## 2010-01-21 ENCOUNTER — Telehealth: Payer: Self-pay | Admitting: Cardiology

## 2010-01-25 ENCOUNTER — Telehealth (INDEPENDENT_AMBULATORY_CARE_PROVIDER_SITE_OTHER): Payer: Self-pay | Admitting: *Deleted

## 2010-01-28 ENCOUNTER — Ambulatory Visit: Payer: Self-pay | Admitting: Cardiology

## 2010-02-01 ENCOUNTER — Ambulatory Visit (HOSPITAL_BASED_OUTPATIENT_CLINIC_OR_DEPARTMENT_OTHER): Admission: RE | Admit: 2010-02-01 | Discharge: 2010-02-02 | Payer: Self-pay | Admitting: Urology

## 2010-02-01 HISTORY — PX: TRANSURETHRAL RESECTION OF PROSTATE: SHX73

## 2010-02-10 ENCOUNTER — Ambulatory Visit: Payer: Self-pay | Admitting: Cardiology

## 2010-02-24 ENCOUNTER — Ambulatory Visit: Payer: Self-pay | Admitting: Internal Medicine

## 2010-02-25 ENCOUNTER — Encounter: Payer: Self-pay | Admitting: Family Medicine

## 2010-03-10 ENCOUNTER — Ambulatory Visit: Payer: Self-pay | Admitting: Cardiovascular Disease

## 2010-03-10 LAB — CONVERTED CEMR LAB: POC INR: 2.9

## 2010-03-17 ENCOUNTER — Ambulatory Visit: Payer: Self-pay | Admitting: Family Medicine

## 2010-04-01 ENCOUNTER — Ambulatory Visit: Payer: Self-pay | Admitting: Cardiovascular Disease

## 2010-04-05 ENCOUNTER — Telehealth: Payer: Self-pay | Admitting: *Deleted

## 2010-04-14 ENCOUNTER — Encounter: Payer: Self-pay | Admitting: Family Medicine

## 2010-04-22 ENCOUNTER — Encounter: Payer: Self-pay | Admitting: Cardiology

## 2010-04-27 ENCOUNTER — Ambulatory Visit: Payer: Self-pay | Admitting: Cardiology

## 2010-04-29 ENCOUNTER — Ambulatory Visit: Payer: Self-pay | Admitting: Cardiology

## 2010-05-05 ENCOUNTER — Telehealth: Payer: Self-pay | Admitting: Family Medicine

## 2010-05-06 ENCOUNTER — Encounter: Payer: Self-pay | Admitting: Family Medicine

## 2010-05-11 ENCOUNTER — Observation Stay (HOSPITAL_COMMUNITY)
Admission: RE | Admit: 2010-05-11 | Discharge: 2010-05-12 | Payer: Self-pay | Source: Home / Self Care | Attending: Orthopedic Surgery | Admitting: Orthopedic Surgery

## 2010-05-11 ENCOUNTER — Encounter: Payer: Self-pay | Admitting: Cardiology

## 2010-05-17 ENCOUNTER — Ambulatory Visit: Admission: RE | Admit: 2010-05-17 | Discharge: 2010-05-17 | Payer: Self-pay | Source: Home / Self Care

## 2010-05-17 LAB — PROTIME-INR
INR: 1.2 (ref 0.00–1.49)
Prothrombin Time: 15.4 seconds — ABNORMAL HIGH (ref 11.6–15.2)

## 2010-05-17 LAB — APTT: aPTT: 44 seconds — ABNORMAL HIGH (ref 24–37)

## 2010-05-24 NOTE — Discharge Summary (Addendum)
[name], [name]:  [id number]  MEDICAL RECORD NO.:  [id number]          PATIENT TYPE:  INP  LOCATION:  1319                         FACILITY:  [facility]  PHYSICIAN:  Georges Lynch. Laronn Devonshire, M.D.DATE OF BIRTH:  December 17, 1931  DATE OF ADMISSION:  05/11/2010 DATE OF DISCHARGE:  05/12/2010                              DISCHARGE SUMMARY   ADMITTING DIAGNOSES:  Severely retracted left rotator cuff tear.  DISCHARGE DIAGNOSES:  Left rotator cuff tear, status post repair of left rotator cuff.  LABORATORY DATA:  Preoperative CBC revealed white count 11.7, hemoglobin 13.9, hematocrit 42.2, platelet count of 306.  Preoperative INR is 2.63. The patient was on Coumadin preoperatively.  Preoperative chemistry panel was unremarkable. Preoperative urinalysis was positive for urinary tract infection.  He has large leukocyte esterase, white blood cells too numerous to count, and few bacteria.  Preoperative PCR for MRSA and Staph aureus were both negative.  PROCEDURE:  On May 11, 2010, Juan Li was taken to the operating room by surgeon, Dr. Ranee Gosselin; assistant, Rozell Searing PA-C.  He underwent open repair of complex retracted degenerative rotator cuff tendon on the left as well as use of TissueMend graft on the left shoulder with 2 Stryker anchor as well as open acromionectomy of the left shoulder and closed manipulation of the left shoulder while the patient was under general anesthesia.  This procedure was performed under general anesthesia.  Routine orthopedic prep and drape were carried out.  The patient received 1 g of IV Ancef preoperatively. There were no complications with the procedure and he was returned to the recovery room in satisfactory condition.  HOSPITAL COURSE:  On May 11, 2010, Juan Li was admitted to [facility].  He underwent the above-stated procedure without complication.  After adequate time in the recovery room, he was  taken to the 5th floor for further recovery.  He was started back on his Coumadin, immediately following surgery.  He was placed on PCA, reduced dose Dilaudid, as well as muscle relaxant for pain control, and he was then in shoulder immobilizer.  On postoperative day #1, the patient was resting comfortably.  No complaints.  He states the pain was well controlled and he was able to rest the night before.  His Foley, his IV, his PCA were all discontinued.  On postoperative day #1 dressing was changed.  Wound was well approximated, no signs of infection.  He was evaluated by Physical Therapy and they discussed use of sling at all times.  Juan Li was discharged to home on postoperative day #1, May 12, 2010.  DISPOSITION:  To home on May 12, 2010.  MEDICATIONS ON DISCHARGE: 1. Tamsulosin. 2. Amlodipine. 3. Fish oil. 4. Namenda. 5. Metoprolol. 6. Omeprazole. 7. Hydrocodone/acetaminophen, he will discontinue. 8. Donepezil. 9. Cyclobenzaprine, he will discontinue. 10.Flomax. 11.Enalapril. 12.Warfarin. 13.Vytorin. 14.Avodart. 15.Lovenox. 16.Percocet. 17.Robaxin.  SPECIAL INSTRUCTIONS:  He was given an 80 mg injection, he will discontinue Lovenox and resume his Coumadin.  ACTIVITY:  He should wear the sling at all times.  He is not to move his shoulder.  He should only take it after shower  and even then should not lift the arm dropped to his side.  DIET:  No restrictions.  WOUND CARE:  Daily dressing change and for the first couple of days, he needs to cover the wound with saran wrap while in the shower, then put on a clean dressing following shower.  FOLLOWUP:  He will follow up with Dr. Darrelyn Li in the office 2 weeks from the day of surgery.  He should contact the office at [phone] to schedule this appointment.  CONDITION ON DISCHARGE:  Improving.     Rozell Searing, PAC   ______________________________ Georges Lynch Juan Li, M.D.    LD/MEDQ  D:   05/23/2010  T:  05/24/2010  Job:  [id number]  Electronically Signed by Rozell Searing  on 05/24/2010 09:20:50 AM Electronically Signed by Ranee Gosselin M.D. on 05/24/2010 11:50:45 AM

## 2010-05-25 ENCOUNTER — Encounter: Payer: Self-pay | Admitting: Family Medicine

## 2010-05-30 LAB — CONVERTED CEMR LAB
BUN: 23 mg/dL (ref 6–23)
CO2: 28 meq/L (ref 19–32)
Calcium: 9 mg/dL (ref 8.4–10.5)
Creatinine, Ser: 1.2 mg/dL (ref 0.4–1.5)
GFR calc non Af Amer: 62.72 mL/min (ref >60)
Glucose, Bld: 81 mg/dL (ref 70–99)

## 2010-06-01 NOTE — Assessment & Plan Note (Signed)
Summary: knee prob,df   Vital Signs:  Patient profile:   77 year old male Weight:      197.2 pounds BMI:     31.94 Temp:     97.9 degrees F Pulse rate:   77 / minute BP sitting:   148 / 59  (right arm)  Vitals Entered By: Doralee Albino MD (November 11, 2009 3:35 PM) CC: knee inj Is Patient Diabetic? No Pain Assessment Patient in pain? yes     Location: knee Intensity: 5   Primary Care Provider:  Doralee Albino MD  CC:  knee inj.  History of Present Illness: Osteoarthritis diffuse but particularly troubling to Rt knee.  Wants injection.  Never had knee injection, has had shoulders.  Habits & Providers  Alcohol-Tobacco-Diet     Tobacco Status: quit     Year Quit: 2001  Current Medications (verified): 1)  Avodart 0.5 Mg Caps (Dutasteride) .... Take 1 Capsule By Mouth Every Night 2)  Cyclobenzaprine Hcl 5 Mg Tabs (Cyclobenzaprine Hcl) .Marland Kitchen.. 1 Tablet By Mouth Every Night 3)  Enalapril Maleate 20 Mg Tabs (Enalapril Maleate) .... Take 1 Tablet By Mouth Twice A Day 4)  Hydrocodone-Acetaminophen 10-500 Mg Tabs (Hydrocodone-Acetaminophen) .... One By Mouth Four Times Daily. 5)  Metoprolol Tartrate 50 Mg  Tabs (Metoprolol Tartrate) .... One Tab By Mouth  Two Times A Day 6)  Vytorin 10-40 Mg Tabs (Ezetimibe-Simvastatin) .... Take 1 Tablet By Mouth Once A Day 7)  Omeprazole 20 Mg Cpdr (Omeprazole) .... One Cap Daily 8)  Flomax 0.4 Mg Cp24 (Tamsulosin Hcl) .... Take 1 Capsule By Mouth Daily 9)  Colchicine 0.6 Mg Tabs (Colchicine) .... One By Mouth Daily 10)  Amlodipine Besylate 5 Mg Tabs (Amlodipine Besylate) .... One By Mouth Daily 11)  Aricept 5 Mg Tabs (Donepezil Hcl) .... One By Mouth Daily 12)  Namenda 10 Mg Tabs (Memantine Hcl) .... One By Mouth Bid 13)  Coumadin 5 Mg Tabs (Warfarin Sodium) .... Use As Directed By Anticoagulation Clinic 14)  Capsaicin 0.025 % Crea (Capsaicin) .... Apply Twice Daily  Allergies (verified): No Known Drug Allergies  Social History: Smoking  Status:  quit  Physical Exam  General:  Well-developed,well-nourished,in no acute distress; alert,appropriate and cooperative throughout examination Extremities:  Rt knee crepitus, no effusion.  Ligaments intact. Injected with 40 mg Kenalog and 3 cc xylocaine. Patient tolerated well   Impression & Recommendations:  Problem # 1:  OSTEOARTHROSIS, GENERALIZED, MULTIPLE SITES (ICD-715.09)  Rt. Knee steroid injection His updated medication list for this problem includes:    Hydrocodone-acetaminophen 10-500 Mg Tabs (Hydrocodone-acetaminophen) ..... One by mouth four times daily.  Orders: [facility]- Est Level  3 ([address]) Injection, large joint- [facility] ([address])  Complete Medication List: 1)  Avodart 0.5 Mg Caps (Dutasteride) .... Take 1 capsule by mouth every night 2)  Cyclobenzaprine Hcl 5 Mg Tabs (Cyclobenzaprine hcl) .Marland Kitchen.. 1 tablet by mouth every night 3)  Enalapril Maleate 20 Mg Tabs (Enalapril maleate) .... Take 1 tablet by mouth twice a day 4)  Hydrocodone-acetaminophen 10-500 Mg Tabs (Hydrocodone-acetaminophen) .... One by mouth four times daily. 5)  Metoprolol Tartrate 50 Mg Tabs (Metoprolol tartrate) .... One tab by mouth  two times a day 6)  Vytorin 10-40 Mg Tabs (Ezetimibe-simvastatin) .... Take 1 tablet by mouth once a day 7)  Omeprazole 20 Mg Cpdr (Omeprazole) .... One cap daily 8)  Flomax 0.4 Mg Cp24 (Tamsulosin hcl) .... Take 1 capsule by mouth daily 9)  Colchicine 0.6 Mg Tabs (Colchicine) .... One by mouth  daily 10)  Amlodipine Besylate 5 Mg Tabs (Amlodipine besylate) .... One by mouth daily 11)  Aricept 5 Mg Tabs (Donepezil hcl) .... One by mouth daily 12)  Namenda 10 Mg Tabs (Memantine hcl) .... One by mouth bid 13)  Coumadin 5 Mg Tabs (Warfarin sodium) .... Use as directed by anticoagulation clinic 14)  Capsaicin 0.025 % Crea (Capsaicin) .... Apply twice daily

## 2010-06-01 NOTE — Miscellaneous (Signed)
Summary: Orders Update  Clinical Lists Changes  Problems: Added new problem of AAA (ICD-441.4) Orders: Added new Test order of Abdominal Aorta Duplex (Abd Aorta Duplex) - Signed 

## 2010-06-01 NOTE — Progress Notes (Signed)
Summary: refill  Phone Note Refill Request Call back at Home Phone [phone] Message from:  Patient  Refills Requested: Medication #1:  HYDROCODONE-ACETAMINOPHEN 10-500 MG TABS one by mouth four times daily.  Medication #2:  NAMENDA 10 MG TABS one by mouth bid  Medication #3:  AMLODIPINE BESYLATE 5 MG TABS one by mouth daily needs written rx to mail to pharmacy - pls call when ready  Initial call taken by: De Nurse,  Sep 14, 2009 11:00 AM  Follow-up for Phone Call        Done and Rxes place up front.  Please notify patient Follow-up by: Doralee Albino MD,  Sep 14, 2009 1:38 PM  Additional Follow-up for Phone Call Additional follow up Details #1::        pt notified Additional Follow-up by: Loralee Pacas CMA,  Sep 15, 2009 11:44 AM    New/Updated Medications: HYDROCODONE-ACETAMINOPHEN 10-500 MG TABS (HYDROCODONE-ACETAMINOPHEN) one by mouth four times daily. Prescriptions: AMLODIPINE BESYLATE 5 MG TABS (AMLODIPINE BESYLATE) one by mouth daily  #90 x 3   Entered and Authorized by:   Doralee Albino MD   Signed by:   Doralee Albino MD on 09/14/2009   Method used:   Print then Give to Patient   RxID:   [id number] NAMENDA 10 MG TABS (MEMANTINE HCL) one by mouth bid  #180 x 3   Entered and Authorized by:   Doralee Albino MD   Signed by:   Doralee Albino MD on 09/14/2009   Method used:   Print then Give to Patient   RxID:   [id number] HYDROCODONE-ACETAMINOPHEN 10-500 MG TABS (HYDROCODONE-ACETAMINOPHEN) one by mouth four times daily.  #360 x 0   Entered and Authorized by:   Doralee Albino MD   Signed by:   Doralee Albino MD on 09/14/2009   Method used:   Print then Give to Patient   RxID:   [id number]

## 2010-06-01 NOTE — Medication Information (Signed)
Summary: rov/sp  Anticoagulant Therapy  Managed by: Cloyde Reams, RN, BSN Referring MD: Charlies Constable MD PCP: Doralee Albino MD Supervising MD: Clifton James MD, Cristal Deer Indication 1: Aortic Valve Replacement (ICD-V43.3) Indication 2: St. Jude Valve Type (ICD-SJV) Lab Used: [facility] Site: [facility] INR POC 3.0 INR RANGE 2.5 - 3.5  Dietary changes: no    Health status changes: no    Bleeding/hemorrhagic complications: no    Recent/future hospitalizations: no    Any changes in medication regimen? no    Recent/future dental: no  Any missed doses?: no       Is patient compliant with meds? yes       Allergies: No Known Drug Allergies  Anticoagulation Management History:      The patient is taking warfarin and comes in today for a routine follow up visit.  Positive risk factors for bleeding include an age of 75 years or older.  The bleeding index is 'intermediate risk'.  Positive CHADS2 values include History of HTN and Age > 33 years old.  The start date was 09/04/1997.  Anticoagulation responsible provider: Clifton James MD, Cristal Deer.  INR POC: 3.0.  Cuvette Lot#: [id number].  Exp: 01/2011.    Anticoagulation Management Assessment/Plan:      The patient's current anticoagulation dose is Coumadin 5 mg tabs: Use as directed by anticoagulation clinic.  The target INR is 2.5 - 3.5.  The next INR is due 12/31/2009.  Anticoagulation instructions were given to patient.  Results were reviewed/authorized by Cloyde Reams, RN, BSN.  He was notified by Cloyde Reams RN.         Prior Anticoagulation Instructions: INR 3.3  Continue same dose of 1 tablet every day except 1/2 tablet on Friday.   Current Anticoagulation Instructions: INR 3.0  Continue on same dosage 1 tablet daily except 1/2 tablet on Fridays.  Recheck in 4 weeks.

## 2010-06-01 NOTE — Miscellaneous (Signed)
Summary: Vytorin rx changed  Clinical Lists Changes Received fax from [facility] re: simvasatin and amlodipine.  Rx for Vytorin 10/20 [facility].  Sarah Swaziland MD  January 13, 2010 11:55 AM  Medications: Added new medication of VYTORIN 10-20 MG TABS (EZETIMIBE-SIMVASTATIN) 1 by mouth at night for cholesterol.  Take instead of your higher dose of Vytorin. - Signed Rx of VYTORIN 10-20 MG TABS (EZETIMIBE-SIMVASTATIN) 1 by mouth at night for cholesterol.  Take instead of your higher dose of Vytorin.;  #30 x 0;  Signed;  Entered by: Sarah Swaziland MD;  Authorized by: Sarah Swaziland MD;  Method used: [facility]  [id number] [address] [place], [place]  [address], Ph: [phone] or [phone], Fax: [phone]    Prescriptions: VYTORIN 10-20 MG TABS (EZETIMIBE-SIMVASTATIN) 1 by mouth at night for cholesterol.  Take instead of your higher dose of Vytorin.  #30 x 0   Entered and Authorized by:   Sarah Swaziland MD   Signed by:   Sarah Swaziland MD on 01/13/2010   Method used:   [facility]  [address] [id number] ([facility]       [address]       [place], [place]  [address]       Ph: [phone] or [phone]       Fax: [phone]   RxID:   [id number]

## 2010-06-01 NOTE — Medication Information (Signed)
Summary: rov/ewj  Anticoagulant Therapy  Managed by: Bethena Midget, RN, BSN Referring MD: Charlies Constable MD PCP: Doralee Albino MD Supervising MD: Johney Frame MD, Fayrene Fearing Indication 1: Aortic Valve Replacement (ICD-V43.3) Indication 2: St. Jude Valve Type (ICD-SJV) Lab Used: [facility] Site: [facility] INR POC 3.0 INR RANGE 2.5 - 3.5  Dietary changes: no    Health status changes: no    Bleeding/hemorrhagic complications: no    Recent/future hospitalizations: no    Any changes in medication regimen? no    Recent/future dental: no  Any missed doses?: no       Is patient compliant with meds? yes       Allergies: No Known Drug Allergies  Anticoagulation Management History:      The patient is taking warfarin and comes in today for a routine follow up visit.  Positive risk factors for bleeding include an age of 75 years or older.  The bleeding index is 'intermediate risk'.  Positive CHADS2 values include History of HTN and Age > 64 years old.  The start date was 09/04/1997.  Anticoagulation responsible provider: Eliane Hammersmith MD, Fayrene Fearing.  INR POC: 3.0.  Cuvette Lot#: [id number].  Exp: 12/2010.    Anticoagulation Management Assessment/Plan:      The patient's current anticoagulation dose is Coumadin 5 mg tabs: Use as directed by anticoagulation clinic.  The target INR is 2.5 - 3.5.  The next INR is due 10/08/2009.  Anticoagulation instructions were given to patient.  Results were reviewed/authorized by Bethena Midget, RN, BSN.  He was notified by Bethena Midget, RN, BSN.         Prior Anticoagulation Instructions: INR 3.1  Continue on same dosage 1 tablet daily except 1/2 tablet on Fridays.  Recheck in 4 weeks.    Current Anticoagulation Instructions: INR 3.0 Continue 5mg s everyday except 2.5mg s on Fridays. Recheck in 4 weeks.

## 2010-06-01 NOTE — Medication Information (Signed)
Summary: rov/ewj  Anticoagulant Therapy  Managed by: Cloyde Reams, RN, BSN Referring MD: Charlies Constable MD PCP: Doralee Albino MD Supervising MD: Shirlee Latch MD, Dalton Indication 1: Aortic Valve Replacement (ICD-V43.3) Indication 2: St. Jude Valve Type (ICD-SJV) Lab Used: [facility] Site: [facility] INR POC 2.1 INR RANGE 2.5 - 3.5  Dietary changes: no    Health status changes: no    Bleeding/hemorrhagic complications: no    Recent/future hospitalizations: no    Any changes in medication regimen? no    Recent/future dental: no  Any missed doses?: yes     Details: Holding Coumadin at present, last dose 01/26/10 for procedure 02/01/10.   Is patient compliant with meds? yes      Comments: Lovenox teching done and instructions given.  1st dosage of 90mg  Lovenox given today in office. Senica Crall RN  January 28, 2010 10:55 AM   Allergies: No Known Drug Allergies  Anticoagulation Management History:      The patient is taking warfarin and comes in today for a routine follow up visit.  Positive risk factors for bleeding include an age of 75 years or older.  The bleeding index is 'intermediate risk'.  Positive CHADS2 values include History of HTN and Age > 61 years old.  The start date was 09/04/1997.  Anticoagulation responsible Paisley Grajeda: Shirlee Latch MD, Dalton.  INR POC: 2.1.  Cuvette Lot#: [id number].  Exp: 03/2011.    Anticoagulation Management Assessment/Plan:      The patient's current anticoagulation dose is Coumadin 5 mg tabs: Use as directed by anticoagulation clinic.  The target INR is 2.5 - 3.5.  The next INR is due 02/15/2010.  Anticoagulation instructions were given to patient.  Results were reviewed/authorized by Cloyde Reams, RN, BSN.  He was notified by Cloyde Reams RN.         Prior Anticoagulation Instructions: INR 2.4  Take 1.5 tablets today, then resume same dosage 1 tablet daily except 1/2 tablet on Fridays.  Recheck in 4 weeks.    Current Anticoagulation  Instructions: 9/27- Last dose of Coumadin 9/28- No Coumadin or Lovenox 9/29- Start Lovenox 90mg  subcutaneously two times a day (pt has appt scheduled at 10:30 to instruct on injection technique) 10/2- Take last lovenox injection in AM 10/3- Procedure Pt will resume Coumadin as directed by MD.    Medication Administration  Injection # 1:    Medication: Lovenox 90mg  Inj.    Diagnosis: HEART VALVE REPLACEMENT, HX OF (ICD-V15.1)    Route: SQ    Site: L abdomen    Exp Date: 07/2012    Lot #: [id number]    Mfr: sanofi Adventis    Patient tolerated injection without complications    Given by: Cloyde Reams RN (January 28, 2010 10:57 AM)

## 2010-06-01 NOTE — Medication Information (Signed)
Summary: rov/eac  Anticoagulant Therapy  Managed by: Cloyde Reams, RN, BSN Referring MD: Charlies Constable MD PCP: Doralee Albino MD Supervising MD: Gala Romney MD, Reuel Boom Indication 1: Aortic Valve Replacement (ICD-V43.3) Indication 2: St. Jude Valve Type (ICD-SJV) Lab Used: [facility] Site: [facility] INR POC 3.1 INR RANGE 2.5 - 3.5  Dietary changes: no    Health status changes: no    Bleeding/hemorrhagic complications: no    Recent/future hospitalizations: no    Any changes in medication regimen? no    Recent/future dental: no  Any missed doses?: no       Is patient compliant with meds? yes       Allergies (verified): No Known Drug Allergies  Anticoagulation Management History:      The patient is taking warfarin and comes in today for a routine follow up visit.  Positive risk factors for bleeding include an age of 61 years or older.  The bleeding index is 'intermediate risk'.  Positive CHADS2 values include History of HTN and Age > 20 years old.  The start date was 09/04/1997.  Anticoagulation responsible provider: Angeleigh Chiasson MD, Reuel Boom.  INR POC: 3.1.  Cuvette Lot#: [id number].  Exp: 08/2010.    Anticoagulation Management Assessment/Plan:      The patient's current anticoagulation dose is Coumadin 5 mg tabs: Use as directed by anticoagulation clinic.  The target INR is 2.5 - 3.5.  The next INR is due 09/10/2009.  Anticoagulation instructions were given to patient.  Results were reviewed/authorized by Cloyde Reams, RN, BSN.  He was notified by Cloyde Reams RN.         Prior Anticoagulation Instructions: INR 3.3  Continue taking 1/2 tablet on Friday and  1 tablet all other days.  Return to clinic in 4 weeks.   Current Anticoagulation Instructions: INR 3.1  Continue on same dosage 1 tablet daily except 1/2 tablet on Fridays.  Recheck in 4 weeks.

## 2010-06-01 NOTE — Miscellaneous (Signed)
Summary: avodart refill  Clinical Lists Changes Refilled via fax request Medications: Rx of AVODART 0.5 MG CAPS (DUTASTERIDE) Take 1 capsule by mouth every night;  #90 x 3;  Signed;  Entered by: Doralee Albino MD;  Authorized by: Doralee Albino MD;  Method used: Handwritten    Prescriptions: AVODART 0.5 MG CAPS (DUTASTERIDE) Take 1 capsule by mouth every night  #90 x 3   Entered and Authorized by:   Doralee Albino MD   Signed by:   Doralee Albino MD on 09/30/2009   Method used:   Handwritten   RxID:   [id number]

## 2010-06-01 NOTE — Assessment & Plan Note (Signed)
Summary: problem with heel,tcb   Vital Signs:  Patient profile:   77 year old male Height:      66 inches Weight:      199.1 pounds BMI:     32.25 Temp:     98.2 degrees F oral Pulse rate:   55 / minute BP sitting:   131 / 71  (left arm) Cuff size:   regular  Vitals Entered By: Loralee Pacas CMA (October 23, 2009 2:30 PM) CC: pain on right heel Pain Assessment Patient in pain? yes     Location: right heel   Primary Care Provider:  Doralee Albino MD  CC:  pain on right heel.  History of Present Illness: Rt achilles tendon pain.  Changed gait and is now using quad cane in Left hand.   also CO bilateral knee pain. On the good side, shoulders are OK right now.  Habits & Providers  Alcohol-Tobacco-Diet     Tobacco Status: never  Current Medications (verified): 1)  Avodart 0.5 Mg Caps (Dutasteride) .... Take 1 Capsule By Mouth Every Night 2)  Cyclobenzaprine Hcl 5 Mg Tabs (Cyclobenzaprine Hcl) .Marland Kitchen.. 1 Tablet By Mouth Every Night 3)  Enalapril Maleate 20 Mg Tabs (Enalapril Maleate) .... Take 1 Tablet By Mouth Twice A Day 4)  Hydrocodone-Acetaminophen 10-500 Mg Tabs (Hydrocodone-Acetaminophen) .... One By Mouth Four Times Daily. 5)  Metoprolol Tartrate 50 Mg  Tabs (Metoprolol Tartrate) .... One Tab By Mouth  Two Times A Day 6)  Vytorin 10-40 Mg Tabs (Ezetimibe-Simvastatin) .... Take 1 Tablet By Mouth Once A Day 7)  Omeprazole 20 Mg Cpdr (Omeprazole) .... One Cap Daily 8)  Flomax 0.4 Mg Cp24 (Tamsulosin Hcl) .... Take 1 Capsule By Mouth Daily 9)  Colchicine 0.6 Mg Tabs (Colchicine) .... One By Mouth Daily 10)  Amlodipine Besylate 5 Mg Tabs (Amlodipine Besylate) .... One By Mouth Daily 11)  Aricept 5 Mg Tabs (Donepezil Hcl) .... One By Mouth Daily 12)  Namenda 10 Mg Tabs (Memantine Hcl) .... One By Mouth Bid 13)  Coumadin 5 Mg Tabs (Warfarin Sodium) .... Use As Directed By Anticoagulation Clinic 14)  Capsaicin 0.025 % Crea (Capsaicin) .... Apply Twice Daily  Allergies  (verified): No Known Drug Allergies  Past History:  Past medical, surgical, family and social histories (including risk factors) reviewed, and no changes noted (except as noted below).  Past Medical History: Reviewed history from 12/31/2008 and no changes required. 1. mini mental status=29.5/30 on 04/05/04 2.  minimal CAD by cath 1993 3. needs SBE prophylaxis 4. 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 TUNA for BPH 8/05 & 7/09 6. Lumbar spine disease 7.Hyperlipidemia 8. Hypertension 9. Valvular heart disease status post St. Jude aortic valve       replacement for aortic stenosis in 1993.  10. Status post cervical spine surgery.  Past Surgical History: Reviewed history from 06/16/2008 and no changes required. Aortic valve replacement -  C spine surg - 08/31/2003  Lt carpal tunnel surg - 01/01/2003  Rt. Knee arthroscopy -  Family History: Reviewed history from 06/16/2008 and no changes required. - DM, Ca, HBP, + CAD, ETOHism The patient's father died at 65 of probable ruptured   abdominal aortic aneurysm.  His mother died at age 61 from a stroke.   Social History: Reviewed history from 06/16/2008 and no changes required. smokes- quit 4/05, restarted: quit 3/06; no regular exercise; works part time in [employer] Retired  Married   Physical Exam  General:  Well-developed,well-nourished,in no acute distress; alert,appropriate and cooperative  throughout examination Extremities:  Knees no effusion.  Ligaments appear intact.  Some crepitis with movement. Rt lower post leg tenderness near junction of achilles tendon and gastroc.  Poor calf flexibility.   Impression & Recommendations:  Problem # 1:  OSTEOARTHROSIS, GENERALIZED, MULTIPLE SITES (ICD-715.09)  Explained cannot do cortisone injection of achilles.  I can do knee injections but we must limit total annual steroid injections.  He has never used capsecium cream and will try.  Could try NSAID but would need to coordination with [place] for  coumadin dosing.  Also recommended calf stretching for his achilles tendonitis. His updated medication list for this problem includes:    Hydrocodone-acetaminophen 10-500 Mg Tabs (Hydrocodone-acetaminophen) ..... One by mouth four times daily.  Orders: [facility]- Est Level  3 (29562)  Complete Medication List: 1)  Avodart 0.5 Mg Caps (Dutasteride) .... Take 1 capsule by mouth every night 2)  Cyclobenzaprine Hcl 5 Mg Tabs (Cyclobenzaprine hcl) .Marland Kitchen.. 1 tablet by mouth every night 3)  Enalapril Maleate 20 Mg Tabs (Enalapril maleate) .... Take 1 tablet by mouth twice a day 4)  Hydrocodone-acetaminophen 10-500 Mg Tabs (Hydrocodone-acetaminophen) .... One by mouth four times daily. 5)  Metoprolol Tartrate 50 Mg Tabs (Metoprolol tartrate) .... One tab by mouth  two times a day 6)  Vytorin 10-40 Mg Tabs (Ezetimibe-simvastatin) .... Take 1 tablet by mouth once a day 7)  Omeprazole 20 Mg Cpdr (Omeprazole) .... One cap daily 8)  Flomax 0.4 Mg Cp24 (Tamsulosin hcl) .... Take 1 capsule by mouth daily 9)  Colchicine 0.6 Mg Tabs (Colchicine) .... One by mouth daily 10)  Amlodipine Besylate 5 Mg Tabs (Amlodipine besylate) .... One by mouth daily 11)  Aricept 5 Mg Tabs (Donepezil hcl) .... One by mouth daily 12)  Namenda 10 Mg Tabs (Memantine hcl) .... One by mouth bid 13)  Coumadin 5 Mg Tabs (Warfarin sodium) .... Use as directed by anticoagulation clinic 14)  Capsaicin 0.025 % Crea (Capsaicin) .... Apply twice daily  Patient Instructions: 1)  Use capsecium cream twice daily on the same joint.  Buy over the counter and buy the roll on type.

## 2010-06-01 NOTE — Miscellaneous (Signed)
Summary: med refill via fax request  Clinical Lists Changes  Medications: Changed medication from ENALAPRIL MALEATE 20 MG TABS (ENALAPRIL MALEATE) Take 1 tablet by mouth twice a day to ENALAPRIL MALEATE 20 MG TABS (ENALAPRIL MALEATE) Take 1 tablet by mouth twice a day - Signed Changed medication from HYDROCODONE-ACETAMINOPHEN 10-500 MG TABS (HYDROCODONE-ACETAMINOPHEN) one by mouth four times daily. to HYDROCODONE-ACETAMINOPHEN 10-500 MG TABS (HYDROCODONE-ACETAMINOPHEN) one by mouth four times daily. - Signed Changed medication from VYTORIN 10-40 MG TABS (EZETIMIBE-SIMVASTATIN) Take 1 tablet by mouth once a day to VYTORIN 10-40 MG TABS (EZETIMIBE-SIMVASTATIN) Take 1 tablet by mouth once a day - Signed Rx of ENALAPRIL MALEATE 20 MG TABS (ENALAPRIL MALEATE) Take 1 tablet by mouth twice a day;  #180 x 3;  Signed;  Entered by: Doralee Albino MD;  Authorized by: Doralee Albino MD;  Method used: Handwritten Rx of HYDROCODONE-ACETAMINOPHEN 10-500 MG TABS (HYDROCODONE-ACETAMINOPHEN) one by mouth four times daily.;  #360 x 3;  Signed;  Entered by: Doralee Albino MD;  Authorized by: Doralee Albino MD;  Method used: Handwritten Rx of VYTORIN 10-40 MG TABS (EZETIMIBE-SIMVASTATIN) Take 1 tablet by mouth once a day;  #90 x 3;  Signed;  Entered by: Doralee Albino MD;  Authorized by: Doralee Albino MD;  Method used: Handwritten    Prescriptions: VYTORIN 10-40 MG TABS (EZETIMIBE-SIMVASTATIN) Take 1 tablet by mouth once a day  #90 x 3   Entered and Authorized by:   Doralee Albino MD   Signed by:   Doralee Albino MD on 01/11/2010   Method used:   Handwritten   RxID:   [id number] HYDROCODONE-ACETAMINOPHEN 10-500 MG TABS (HYDROCODONE-ACETAMINOPHEN) one by mouth four times daily.  #360 x 3   Entered and Authorized by:   Doralee Albino MD   Signed by:   Doralee Albino MD on 01/11/2010   Method used:   Handwritten   RxID:   [id number] ENALAPRIL MALEATE 20 MG TABS (ENALAPRIL MALEATE) Take 1 tablet by  mouth twice a day  #180 x 3   Entered and Authorized by:   Doralee Albino MD   Signed by:   Doralee Albino MD on 01/11/2010   Method used:   Handwritten   RxID:   [id number]

## 2010-06-01 NOTE — Medication Information (Signed)
Summary: rov/sp  Anticoagulant Therapy  Managed by: Weston Brass, PharmD Referring MD: Charlies Constable MD PCP: Doralee Albino MD Supervising MD: Ladona Ridgel MD, Sharlot Gowda Indication 1: Aortic Valve Replacement (ICD-V43.3) Indication 2: St. Jude Valve Type (ICD-SJV) Lab Used: [facility] Site: [facility] INR POC 3.3 INR RANGE 2.5 - 3.5  Dietary changes: no    Health status changes: no    Bleeding/hemorrhagic complications: no    Recent/future hospitalizations: no    Any changes in medication regimen? no    Recent/future dental: no  Any missed doses?: no       Is patient compliant with meds? yes       Allergies: No Known Drug Allergies  Anticoagulation Management History:      The patient is taking warfarin and comes in today for a routine follow up visit.  Positive risk factors for bleeding include an age of 4 years or older.  The bleeding index is 'intermediate risk'.  Positive CHADS2 values include History of HTN and Age > 12 years old.  The start date was 09/04/1997.  Anticoagulation responsible provider: Ladona Ridgel MD, Sharlot Gowda.  INR POC: 3.3.  Cuvette Lot#: [id number].  Exp: 01/2011.    Anticoagulation Management Assessment/Plan:      The patient's current anticoagulation dose is Coumadin 5 mg tabs: Use as directed by anticoagulation clinic.  The target INR is 2.5 - 3.5.  The next INR is due 12/03/2009.  Anticoagulation instructions were given to patient.  Results were reviewed/authorized by Weston Brass, PharmD.  He was notified by Weston Brass PharmD.         Prior Anticoagulation Instructions: INR 2.9  Continue same dose of 1 tablet every day except 1/2 tablet on Friday.    Current Anticoagulation Instructions: INR 3.3  Continue same dose of 1 tablet every day except 1/2 tablet on Friday.

## 2010-06-01 NOTE — Assessment & Plan Note (Signed)
Summary: f/up,tcb   Vital Signs:  Patient profile:   77 year old male Height:      66 inches Weight:      200.6 pounds BMI:     32.49 Temp:     97.8 degrees F oral Pulse rate:   55 / minute BP sitting:   140 / 61  (left arm) Cuff size:   regular  Vitals Entered By: Gladstone Pih (August 28, 2009 2:53 PM) CC: F/U Is Patient Diabetic? No Pain Assessment Patient in pain? yes     Location: knee Intensity: 5 Type: aching Onset of pain  Chronic   Primary Care Provider:  Doralee Albino MD  CC:  F/U.  History of Present Illness: Rt knee pain.  Fell x 3.  Falling is typically a combination of getting up quickly, balance problem and knee pain/giving out on him.  He is using cane incorrectly and does not always have it with him  Habits & Providers  Alcohol-Tobacco-Diet     Tobacco Status: never  Current Medications (verified): 1)  Avodart 0.5 Mg Caps (Dutasteride) .... Take 1 Capsule By Mouth Every Night 2)  Cyclobenzaprine Hcl 5 Mg Tabs (Cyclobenzaprine Hcl) .Marland Kitchen.. 1 Tablet By Mouth Every Night 3)  Enalapril Maleate 20 Mg Tabs (Enalapril Maleate) .... Take 1 Tablet By Mouth Twice A Day 4)  Hydrocodone-Acetaminophen 10-500 Mg Tabs (Hydrocodone-Acetaminophen) .... One By Mouth Four Times Daily. 5)  Metoprolol Tartrate 50 Mg  Tabs (Metoprolol Tartrate) .... One Tab By Mouth  Two Times A Day 6)  Vytorin 10-40 Mg Tabs (Ezetimibe-Simvastatin) .... Take 1 Tablet By Mouth Once A Day 7)  Omeprazole 20 Mg Cpdr (Omeprazole) .... One Cap Daily 8)  Flomax 0.4 Mg Cp24 (Tamsulosin Hcl) .... Take 1 Capsule By Mouth Daily 9)  Colchicine 0.6 Mg Tabs (Colchicine) .... One By Mouth Daily 10)  Amlodipine Besylate 5 Mg Tabs (Amlodipine Besylate) .... One By Mouth Daily 11)  Aricept 5 Mg Tabs (Donepezil Hcl) .... One By Mouth Daily 12)  Namenda 10 Mg Tabs (Memantine Hcl) .... One By Mouth Bid 13)  Coumadin 5 Mg Tabs (Warfarin Sodium) .... Use As Directed By Anticoagulation Clinic  Allergies  (verified): No Known Drug Allergies  Past History:  Past medical, surgical, family and social histories (including risk factors) reviewed, and no changes noted (except as noted below).  Past Medical History: Reviewed history from 12/31/2008 and no changes required. 1. mini mental status=29.5/30 on 04/05/04 2.  minimal CAD by cath 1993 3. needs SBE prophylaxis 4. 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 TUNA for BPH 8/05 & 7/09 6. Lumbar spine disease 7.Hyperlipidemia 8. Hypertension 9. Valvular heart disease status post St. Jude aortic valve       replacement for aortic stenosis in 1993.  10. Status post cervical spine surgery.  Past Surgical History: Reviewed history from 06/16/2008 and no changes required. Aortic valve replacement -  C spine surg - 08/31/2003  Lt carpal tunnel surg - 01/01/2003  Rt. Knee arthroscopy -  Family History: Reviewed history from 06/16/2008 and no changes required. - DM, Ca, HBP, + CAD, ETOHism The patient's father died at 54 of probable ruptured   abdominal aortic aneurysm.  His mother died at age 50 from a stroke.   Social History: Reviewed history from 06/16/2008 and no changes required. smokes- quit 4/05, restarted: quit 3/06; no regular exercise; works part time in [employer] Retired  Married   Physical Exam  General:  Well-developed,well-nourished,in no acute distress; alert,appropriate and cooperative throughout  examination Extremities:  Rt. knee ligaments intact to provocative  testing. Neurologic:  gait is narrow and fast.  a bit reminiscent of the small steps of Parkinsons.     Impression & Recommendations:  Problem # 1:  OSTEOARTHROSIS, GENERALIZED, MULTIPLE SITES (ICD-715.09)  Osteo of Rt. knee, compounded by poor judgment (early dementia) and known lingering effects of cervical cord compression.  Major interventions were to discuss proper use of cane, slow gait down.  Offered PT but he decline.  "They have never helped me much." His updated  medication list for this problem includes:    Hydrocodone-acetaminophen 10-500 Mg Tabs (Hydrocodone-acetaminophen) ..... One by mouth four times daily.  Orders: [facility]- Est Level  3 (62130)  Complete Medication List: 1)  Avodart 0.5 Mg Caps (Dutasteride) .... Take 1 capsule by mouth every night 2)  Cyclobenzaprine Hcl 5 Mg Tabs (Cyclobenzaprine hcl) .Marland Kitchen.. 1 tablet by mouth every night 3)  Enalapril Maleate 20 Mg Tabs (Enalapril maleate) .... Take 1 tablet by mouth twice a day 4)  Hydrocodone-acetaminophen 10-500 Mg Tabs (Hydrocodone-acetaminophen) .... One by mouth four times daily. 5)  Metoprolol Tartrate 50 Mg Tabs (Metoprolol tartrate) .... One tab by mouth  two times a day 6)  Vytorin 10-40 Mg Tabs (Ezetimibe-simvastatin) .... Take 1 tablet by mouth once a day 7)  Omeprazole 20 Mg Cpdr (Omeprazole) .... One cap daily 8)  Flomax 0.4 Mg Cp24 (Tamsulosin hcl) .... Take 1 capsule by mouth daily 9)  Colchicine 0.6 Mg Tabs (Colchicine) .... One by mouth daily 10)  Amlodipine Besylate 5 Mg Tabs (Amlodipine besylate) .... One by mouth daily 11)  Aricept 5 Mg Tabs (Donepezil hcl) .... One by mouth daily 12)  Namenda 10 Mg Tabs (Memantine hcl) .... One by mouth bid 13)  Coumadin 5 Mg Tabs (Warfarin sodium) .... Use as directed by anticoagulation clinic  Other Orders: Tdap => 23yrs IM (86578) Admin 1st Vaccine (46962)   Prevention & Chronic Care Immunizations   Influenza vaccine: Fluvax MCR  (02/11/2009)   Influenza vaccine due: 06/11/2009    Tetanus booster: 08/28/2009: Tdap   Tetanus booster due: 06/02/2009    Pneumococcal vaccine: Done.  (04/01/2001)   Pneumococcal vaccine due: None    H. zoster vaccine: Not documented  Colorectal Screening   Hemoccult: normal  (12/14/2007)   Hemoccult action/deferral: Not indicated  (05/27/2009)   Hemoccult due: 12/13/2008    Colonoscopy: Not documented   Colonoscopy due: Not Indicated  Other Screening   PSA: 0.67  (11/13/2007)   PSA due due:  Not Indicated   Smoking status: never  (08/28/2009)  Lipids   Total Cholesterol: 140  (05/27/2009)   LDL: 68  (05/27/2009)   LDL Direct: Not documented   HDL: 39  (05/27/2009)   Triglycerides: 163  (05/27/2009)    SGOT (AST): 21  (05/27/2009)   SGPT (ALT): 15  (05/27/2009)   Alkaline phosphatase: 53  (05/27/2009)   Total bilirubin: 0.8  (05/27/2009)    Lipid flowsheet reviewed?: Yes   Progress toward LDL goal: At goal  Hypertension   Last Blood Pressure: 140 / 61  (08/28/2009)   Serum creatinine: 1.24  (05/27/2009)   Serum potassium 4.8  (05/27/2009)    Hypertension flowsheet reviewed?: Yes   Progress toward BP goal: At goal  Self-Management Support :   Personal Goals (by the next clinic visit) :      Personal blood pressure goal: 140/90  (01/21/2009)     Personal LDL goal: 100  (01/21/2009)  Hypertension self-management support: Written self-care plan  (05/27/2009)    Lipid self-management support: Written self-care plan  (05/27/2009)    Nursing Instructions: Give tetanus booster today    Immunizations Administered:  Tetanus Vaccine:    Vaccine Type: Tdap    Site: right deltoid    Mfr: GlaxoSmithKline    Dose: 0.5 ml    Route: IM    Given by: Gladstone Pih    Exp. Date: 07/25/2011    Lot #: [id number]    VIS given: 03/20/07 version given August 28, 2009.    Physician counseled: yes

## 2010-06-01 NOTE — Progress Notes (Signed)
Summary: Referral  Phone Note Call from Patient Call back at Home Phone [phone]   Reason for Call: Talk to Doctor Summary of Call: pt wants to be referred to an ortho doctor, he is having shoulder pain Initial call taken by: Knox Royalty,  April 05, 2010 11:25 AM  Follow-up for Phone Call        spoke with patint and he states pain is getting worse and he  has difficulty lifting arm now. will forward to Dr. Leveda Anna. Follow-up by: Theresia Lo RN,  April 05, 2010 12:11 PM  Additional Follow-up for Phone Call Additional follow up Details #1::        Called and LM that Ortho referral OK.  I will have nurse arrange Additional Follow-up by: Doralee Albino MD,  April 06, 2010 2:27 PM    Additional Follow-up for Phone Call Additional follow up Details #2::    pt anxious to be seen soon, he is having pain Follow-up by: Knox Royalty,  April 08, 2010 2:48 PM  Additional Follow-up for Phone Call Additional follow up Details #3:: Details for Additional Follow-up Action Taken: referral faxed to [facility] [phone] they will call and schedule appt with for pt.  spoke with his wife [place] and she will relate this information to him Additional Follow-up by: Loralee Pacas CMA,  April 09, 2010 9:12 AM

## 2010-06-01 NOTE — Letter (Signed)
Summary: [facility] - Office Note  [facility] - Office Note   Imported By: Marylou Mccoy 02/08/2010 07:43:27  _____________________________________________________________________  External Attachment:    Type:   Image     Comment:   External Document

## 2010-06-01 NOTE — Miscellaneous (Signed)
Summary: Med refill via fax request  Clinical Lists Changes  Medications: Changed medication from METOPROLOL TARTRATE 50 MG  TABS (METOPROLOL TARTRATE) one tab by mouth  two times a day to METOPROLOL TARTRATE 50 MG  TABS (METOPROLOL TARTRATE) one tab by mouth  two times a day - Signed Added new medication of CYCLOBENZAPRINE HCL 5 MG TABS (CYCLOBENZAPRINE HCL) one by mouth at bedtime - Signed Rx of METOPROLOL TARTRATE 50 MG  TABS (METOPROLOL TARTRATE) one tab by mouth  two times a day;  #180 x 3;  Signed;  Entered by: Doralee Albino MD;  Authorized by: Doralee Albino MD;  Method used: Handwritten Rx of CYCLOBENZAPRINE HCL 5 MG TABS (CYCLOBENZAPRINE HCL) one by mouth at bedtime;  #90 x 3;  Signed;  Entered by: Doralee Albino MD;  Authorized by: Doralee Albino MD;  Method used: Handwritten    Prescriptions: CYCLOBENZAPRINE HCL 5 MG TABS (CYCLOBENZAPRINE HCL) one by mouth at bedtime  #90 x 3   Entered and Authorized by:   Doralee Albino MD   Signed by:   Doralee Albino MD on 02/25/2010   Method used:   Handwritten   RxID:   [id number] METOPROLOL TARTRATE 50 MG  TABS (METOPROLOL TARTRATE) one tab by mouth  two times a day  #180 x 3   Entered and Authorized by:   Doralee Albino MD   Signed by:   Doralee Albino MD on 02/25/2010   Method used:   Handwritten   RxID:   [id number]

## 2010-06-01 NOTE — Medication Information (Signed)
Summary: rov/sel  Anticoagulant Therapy  Managed by: Bethena Midget, RN, BSN Referring MD: Charlies Constable MD PCP: Doralee Albino MD Supervising MD: Eden Emms MD, Theron Arista Indication 1: Aortic Valve Replacement (ICD-V43.3) Indication 2: St. Jude Valve Type (ICD-SJV) Lab Used: [facility] Site: [facility] INR POC 2.9 INR RANGE 2.5 - 3.5  Dietary changes: no    Health status changes: no    Bleeding/hemorrhagic complications: no    Recent/future hospitalizations: no    Any changes in medication regimen? no    Recent/future dental: no  Any missed doses?: no       Is patient compliant with meds? yes       Allergies: No Known Drug Allergies  Anticoagulation Management History:      The patient is taking warfarin and comes in today for a routine follow up visit.  Positive risk factors for bleeding include an age of 75 years or older.  The bleeding index is 'intermediate risk'.  Positive CHADS2 values include History of HTN and Age > 49 years old.  The start date was 09/04/1997.  Anticoagulation responsible provider: Eden Emms MD, Theron Arista.  INR POC: 2.9.  Cuvette Lot#: [id number].  Exp: 03/2011.    Anticoagulation Management Assessment/Plan:      The patient's current anticoagulation dose is Coumadin 5 mg tabs: Use as directed by anticoagulation clinic.  The target INR is 2.5 - 3.5.  The next INR is due 04/01/2010.  Anticoagulation instructions were given to patient.  Results were reviewed/authorized by Bethena Midget, RN, BSN.  He was notified by Bethena Midget, RN, BSN.         Prior Anticoagulation Instructions: INR 2.2  Take an extra 1/2 tablet today, then continue same dose of 1 tablet everyday except 1/2 tablet on Friday. Recheck in 2 weeks.   Current Anticoagulation Instructions: INR 2.9 Continue 5mg s everyday except 2.5mg s on Fridays. Recheck in 3 weeks.

## 2010-06-01 NOTE — Assessment & Plan Note (Signed)
Summary: not feeling well/have had a couple of falls/bmc   Vital Signs:  Patient profile:   77 year old male Weight:      195 pounds Temp:     98.5 degrees F oral Pulse rate:   61 / minute Pulse rhythm:   regular BP sitting:   145 / 75  (left arm) Cuff size:   regular  Vitals Entered By: Loralee Pacas CMA (March 17, 2010 2:51 PM) CC: falling,not felling well   Primary Care Provider:  Doralee Albino MD  CC:  falling and not felling well.  History of Present Illness: Larey Seat , various pains Lt shoulder worst.  Hx of rotator cuff syndrome in both shoulders. Rt. Knee seems to be giving out on him.   He is not very coordinated with cane and uses in wrong hand.  Instructed again but may need to go to a walker.  Habits & Providers  Alcohol-Tobacco-Diet     Tobacco Status: quit     Year Quit: 2001  Current Medications (verified): 1)  Avodart 0.5 Mg Caps (Dutasteride) .... Take 1 Capsule By Mouth Every Night 2)  Enalapril Maleate 20 Mg Tabs (Enalapril Maleate) .... Take 1 Tablet By Mouth Twice A Day 3)  Hydrocodone-Acetaminophen 10-500 Mg Tabs (Hydrocodone-Acetaminophen) .... One By Mouth Four Times Daily. 4)  Metoprolol Tartrate 50 Mg  Tabs (Metoprolol Tartrate) .... One Tab By Mouth  Two Times A Day 5)  Omeprazole 20 Mg Cpdr (Omeprazole) .... One Cap Daily 6)  Flomax 0.4 Mg Cp24 (Tamsulosin Hcl) .... Take 1 Capsule By Mouth Daily 7)  Amlodipine Besylate 5 Mg Tabs (Amlodipine Besylate) .... One By Mouth Daily 8)  Aricept 5 Mg Tabs (Donepezil Hcl) .... One By Mouth Daily 9)  Namenda 10 Mg Tabs (Memantine Hcl) .... One By Mouth Bid 10)  Coumadin 5 Mg Tabs (Warfarin Sodium) .... Use As Directed By Anticoagulation Clinic 11)  Vytorin 10-20 Mg Tabs (Ezetimibe-Simvastatin) .Marland Kitchen.. 1 By Mouth At Night For Cholesterol.  Take Instead of Your Higher Dose of Vytorin. 12)  Enoxaparin Sodium 100 Mg/ml Soln (Enoxaparin Sodium) .... Inject 90mg  Subcutaneously Two Times A Day As Directed 13)   Cyclobenzaprine Hcl 5 Mg Tabs (Cyclobenzaprine Hcl) .... One By Mouth At Bedtime  Allergies (verified): No Known Drug Allergies  Past History:  Past medical, surgical, family and social histories (including risk factors) reviewed, and no changes noted (except as noted below).  Past Medical History: Reviewed history from 01/19/2010 and no changes required. 1. mini mental status=29.5/30 on 04/05/04 2.  minimal CAD by cath 1993 3. needs SBE prophylaxis 4. 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 TUNA for BPH 8/05 & 7/09 6. Lumbar spine disease 7.Hyperlipidemia 8. Hypertension 9. Valvular heart disease status post St. Jude aortic valve       replacement for aortic stenosis in 1993.  10. Status post cervical spine surgery. 11.Gout 12.Gait disturbance  Past Surgical History: Aortic valve replacement -  C spine surg - 08/31/2003  Lt carpal tunnel surg - 01/01/2003  Rt. Knee arthroscopy -  CT HEAD WITHOUT CONTRAST prostrate surg TURP 10/11    Family History: Reviewed history from 01/19/2010 and no changes required.  The patient's father died at 47 of probable ruptured   abdominal aortic aneurysm.  His mother died at age 36 from a stroke.      Social History: Reviewed history from 01/19/2010 and no changes required.  The patient is married and has three children, but one  of his children passed away.  He is a previous smoker.  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   Physical Exam  General:  Well-developed,well-nourished,in no acute distress; alert,appropriate and cooperative throughout examination Msk:  Rt knee ligaments appear intact.  He has considerable crepitis.  Lt shoulder + impingement.  Injection done with 40 mg Kenalog and 3cc xylocaine with fair immediate relief.    Impression & Recommendations:  Problem # 1:  OSTEOARTHROSIS, GENERALIZED, MULTIPLE SITES (ICD-715.09)  Knee giving out causing falls.  Walker, may need ortho referral His updated medication list for this problem  includes:    Hydrocodone-acetaminophen 10-500 Mg Tabs (Hydrocodone-acetaminophen) ..... One by mouth four times daily.  Orders: [facility]- Est Level  3 (16109)  Problem # 2:  ROTATOR CUFF TENDONITIS (ICD-726.10)  Left shoulder injected.  Hopefully, he will be able to use cane in proper hand to support Rt. Knee  Orders: [facility]- Est Level  3 (60454) Injection, large joint- [facility] (09811)  Complete Medication List: 1)  Avodart 0.5 Mg Caps (Dutasteride) .... Take 1 capsule by mouth every night 2)  Enalapril Maleate 20 Mg Tabs (Enalapril maleate) .... Take 1 tablet by mouth twice a day 3)  Hydrocodone-acetaminophen 10-500 Mg Tabs (Hydrocodone-acetaminophen) .... One by mouth four times daily. 4)  Metoprolol Tartrate 50 Mg Tabs (Metoprolol tartrate) .... One tab by mouth  two times a day 5)  Omeprazole 20 Mg Cpdr (Omeprazole) .... One cap daily 6)  Flomax 0.4 Mg Cp24 (Tamsulosin hcl) .... Take 1 capsule by mouth daily 7)  Amlodipine Besylate 5 Mg Tabs (Amlodipine besylate) .... One by mouth daily 8)  Aricept 5 Mg Tabs (Donepezil hcl) .... One by mouth daily 9)  Namenda 10 Mg Tabs (Memantine hcl) .... One by mouth bid 10)  Coumadin 5 Mg Tabs (Warfarin sodium) .... Use as directed by anticoagulation clinic 11)  Vytorin 10-20 Mg Tabs (Ezetimibe-simvastatin) .Marland Kitchen.. 1 by mouth at night for cholesterol.  take instead of your higher dose of vytorin. 12)  Enoxaparin Sodium 100 Mg/ml Soln (Enoxaparin sodium) .... Inject 90mg  subcutaneously two times a day as directed 13)  Cyclobenzaprine Hcl 5 Mg Tabs (Cyclobenzaprine hcl) .... One by mouth at bedtime  Other Orders: Influenza Vaccine MCR [phone])   Orders Added: 1)  Influenza Vaccine MCR [00025] 2)  [facility]- Est Level  3 [29562] 3)  Injection, large joint- [facility] [20610]   Immunizations Administered:  Influenza Vaccine # 1:    Vaccine Type: Fluvax MCR    Site: right deltoid    Mfr: GlaxoSmithKline    Dose: 0.5 ml    Route: IM    Given by: Loralee Pacas  CMA    Exp. Date: 10/30/2010    Lot #: [id number]    VIS given: 11/24/09 version given March 17, 2010.  Flu Vaccine Consent Questions:    Do you have a history of severe allergic reactions to this vaccine? no    Any prior history of allergic reactions to egg and/or gelatin? no    Do you have a sensitivity to the preservative Thimersol? no    Do you have a past history of Guillan-Barre Syndrome? no    Do you currently have an acute febrile illness? no    Have you ever had a severe reaction to latex? no    Vaccine information given and explained to patient? yes   Immunizations Administered:  Influenza Vaccine # 1:    Vaccine Type: Fluvax MCR    Site: right deltoid    Mfr: GlaxoSmithKline  Dose: 0.5 ml    Route: IM    Given by: Loralee Pacas CMA    Exp. Date: 10/30/2010    Lot #: [id number]    VIS given: 11/24/09 version given March 17, 2010.

## 2010-06-01 NOTE — Medication Information (Signed)
Summary: rov-tp  Anticoagulant Therapy  Managed by: Bethena Midget, RN, BSN Referring MD: Charlies Constable MD PCP: Doralee Albino MD Supervising MD: Antoine Poche MD, Fayrene Fearing Indication 1: Aortic Valve Replacement (ICD-V43.3) Indication 2: St. Jude Valve Type (ICD-SJV) Lab Used: [facility] Site: [facility] INR POC 3.7 INR RANGE 2.5 - 3.5  Dietary changes: no    Health status changes: no    Bleeding/hemorrhagic complications: no    Recent/future hospitalizations: no    Any changes in medication regimen? no    Recent/future dental: no  Any missed doses?: no       Is patient compliant with meds? yes       Allergies: No Known Drug Allergies  Anticoagulation Management History:      The patient is taking warfarin and comes in today for a routine follow up visit.  Positive risk factors for bleeding include an age of 75 years or older.  The bleeding index is 'intermediate risk'.  Positive CHADS2 values include History of HTN and Age > 68 years old.  The start date was 09/04/1997.  Anticoagulation responsible provider: Antoine Poche MD, Fayrene Fearing.  INR POC: 3.7.  Cuvette Lot#: [id number].  Exp: 08/2010.    Anticoagulation Management Assessment/Plan:      The patient's current anticoagulation dose is Coumadin 5 mg tabs: Use as directed by anticoagulation clinic.  The target INR is 2.5 - 3.5.  The next INR is due 07/16/2009.  Anticoagulation instructions were given to patient.  Results were reviewed/authorized by Bethena Midget, RN, BSN.  He was notified by Bethena Midget, RN, BSN.         Prior Anticoagulation Instructions: Continue 5mg  daily except 2.5mg  on Fridays.  Current Anticoagulation Instructions: INR 3.7 Today take 2.5mg s, then resume 5mg s everyday except 2.5mg s on Fridays. Recheck in 3 weeks.

## 2010-06-01 NOTE — Medication Information (Signed)
Summary: rov/tm  Anticoagulant Therapy  Managed by: Eda Keys, PharmD Referring MD: Charlies Constable MD PCP: Doralee Albino MD Supervising MD: Ladona Ridgel MD, Sharlot Gowda Indication 1: Aortic Valve Replacement (ICD-V43.3) Indication 2: St. Jude Valve Type (ICD-SJV) Lab Used: [facility] Site: [facility] INR RANGE 2.5 - 3.5  Dietary changes: no    Health status changes: no    Bleeding/hemorrhagic complications: no    Recent/future hospitalizations: no    Any changes in medication regimen? no    Recent/future dental: no  Any missed doses?: no       Is patient compliant with meds? yes       Allergies: No Known Drug Allergies  Anticoagulation Management History:      The patient is taking warfarin and comes in today for a routine follow up visit.  Positive risk factors for bleeding include an age of 75 years or older.  The bleeding index is 'intermediate risk'.  Positive CHADS2 values include History of HTN and Age > 75 years old.  The start date was 09/04/1997.  Anticoagulation responsible provider: Ladona Ridgel MD, Sharlot Gowda.  Cuvette Lot#: [id number].  Exp: 08/2010.    Anticoagulation Management Assessment/Plan:      The patient's current anticoagulation dose is Coumadin 5 mg tabs: Use as directed by anticoagulation clinic.  The target INR is 2.5 - 3.5.  The next INR is due 08/13/2009.  Anticoagulation instructions were given to patient.  Results were reviewed/authorized by Eda Keys, PharmD.  He was notified by Eda Keys.         Prior Anticoagulation Instructions: INR 3.7 Today take 2.5mg s, then resume 5mg s everyday except 2.5mg s on Fridays. Recheck in 3 weeks.   Current Anticoagulation Instructions: INR 3.3  Continue taking 1/2 tablet on Friday and  1 tablet all other days.  Return to clinic in 4 weeks.

## 2010-06-01 NOTE — Progress Notes (Signed)
Summary: dose questions  Phone Note Call from Patient Call back at Home Phone [phone]   Caller: ;pt Reason for Call: Talk to Nurse Summary of Call: pt needs to speak with a nurse about what dose he needs to be on. Initial call taken by: Faythe Ghee,  January 25, 2010 10:44 AM  Follow-up for Phone Call        Pt taking hydrocodone/APAP for pain.  Wanted to know if he could continue taking this with Lovenox.  Told pt this would be okay.  Follow-up by: Weston Brass PharmD,  January 25, 2010 10:53 AM

## 2010-06-01 NOTE — Miscellaneous (Signed)
Summary: med refill  Clinical Lists Changes  Medications: Changed medication from HYDROCODONE-ACETAMINOPHEN 5-500 MG TABS (HYDROCODONE-ACETAMINOPHEN) Take 1 tablet by mouth four times a day to HYDROCODONE-ACETAMINOPHEN 5-500 MG TABS (HYDROCODONE-ACETAMINOPHEN) Take 1 tablet by mouth four times a day - Signed Rx of HYDROCODONE-ACETAMINOPHEN 5-500 MG TABS (HYDROCODONE-ACETAMINOPHEN) Take 1 tablet by mouth four times a day;  #360 x 3;  Signed;  Entered by: Doralee Albino MD;  Authorized by: Doralee Albino MD;  Method used: Handwritten    Prescriptions: HYDROCODONE-ACETAMINOPHEN 5-500 MG TABS (HYDROCODONE-ACETAMINOPHEN) Take 1 tablet by mouth four times a day  #360 x 3   Entered and Authorized by:   Doralee Albino MD   Signed by:   Doralee Albino MD on 04/14/2009   Method used:   Handwritten   RxID:   [id number]

## 2010-06-01 NOTE — Letter (Signed)
Summary: [facility] - Notification of Surgery  [facility] - Notification of Surgery   Imported By: Marylou Mccoy 02/08/2010 07:41:36  _____________________________________________________________________  External Attachment:    Type:   Image     Comment:   External Document

## 2010-06-01 NOTE — Medication Information (Signed)
Summary: rov/sel  Anticoagulant Therapy  Managed by: Weston Brass, PharmD Referring MD: Charlies Constable MD PCP: Doralee Albino MD Supervising MD: Ladona Ridgel MD, Sharlot Gowda Indication 1: Aortic Valve Replacement (ICD-V43.3) Indication 2: St. Jude Valve Type (ICD-SJV) Lab Used: [facility] Site: [facility] INR POC 2.2 INR RANGE 2.5 - 3.5  Dietary changes: no    Health status changes: no    Bleeding/hemorrhagic complications: no    Recent/future hospitalizations: no    Any changes in medication regimen? no    Recent/future dental: no  Any missed doses?: no       Is patient compliant with meds? yes       Allergies: No Known Drug Allergies  Anticoagulation Management History:      The patient is taking warfarin and comes in today for a routine follow up visit.  Positive risk factors for bleeding include an age of 20 years or older.  The bleeding index is 'intermediate risk'.  Positive CHADS2 values include History of HTN and Age > 69 years old.  The start date was 09/04/1997.  Anticoagulation responsible provider: Ladona Ridgel MD, Sharlot Gowda.  INR POC: 2.2.  Cuvette Lot#: [id number].  Exp: 03/2011.    Anticoagulation Management Assessment/Plan:      The patient's current anticoagulation dose is Coumadin 5 mg tabs: Use as directed by anticoagulation clinic.  The target INR is 2.5 - 3.5.  The next INR is due 03/10/2010.  Anticoagulation instructions were given to patient.  Results were reviewed/authorized by Weston Brass, PharmD.  He was notified by Ilean Skill D candidate.         Prior Anticoagulation Instructions: INR 1.2  Take 1 1/2 tablet today and tomorrow. Then continue taking same dose of 1 tablet everyday except 1/2 tablet on Friday. Recheck in 2 weeks.   Current Anticoagulation Instructions: INR 2.2  Take an extra 1/2 tablet today, then continue same dose of 1 tablet everyday except 1/2 tablet on Friday. Recheck in 2 weeks.

## 2010-06-01 NOTE — Progress Notes (Signed)
Summary: FYI (flag sent to [facility])  Phone Note From Other Clinic   Caller: Nurse Summary of Call: PER SELITA PT SURGERY IS SCHEDULED FOR OCT 3 @ 11:00 [facility] Initial call taken by: Edman Circle,  January 21, 2010 11:21 AM  Follow-up for Phone Call        Noted. Flag sent to Weston Brass- pharm D to assist with lovenox bridging. Sherri Rad, RN, BSN  January 21, 2010 5:03 PM   Additional Follow-up for Phone Call Additional follow up Details #1::        Per Dr. Juanda Chance office note, pt will be bridged prior to procedure but will not bridge afterwards (Having TURP- high bleeding risk).  Pt's weight- 89kg, CrCl- 64.    Spoke with pt.  He is aware of the following instructions:   9/27- Last dose of Coumadin 9/28- No Coumadin or Lovenox 9/29- Start Lovenox 90mg  subcutaneously two times a day (pt has appt scheduled at 10:30 to instruct on injection technique) 10/2- Take last lovenox injection in AM 10/3- Procedure Pt will resume Coumadin as directed by MD.  Additional Follow-up by: Weston Brass PharmD,  January 25, 2010 10:05 AM    New/Updated Medications: ENOXAPARIN SODIUM 100 MG/ML SOLN (ENOXAPARIN SODIUM) Inject 90mg  subcutaneously two times a day as directed Prescriptions: ENOXAPARIN SODIUM 100 MG/ML SOLN (ENOXAPARIN SODIUM) Inject 90mg  subcutaneously two times a day as directed  #7 syringes x 0   Entered by:   Weston Brass PharmD   Authorized by:   Lenoria Farrier, MD, [facility]   Signed by:   Weston Brass PharmD on 01/25/2010   Method used:   Electronically to        [facility]  [address] [id number] ([facility])       [address]       [place], [place]  [address]       Ph: [phone] or [phone]       Fax: [phone]   RxID:   [id number]

## 2010-06-01 NOTE — Assessment & Plan Note (Signed)
Summary: f/u visit/b mc   Vital Signs:  Patient profile:   77 year old male Height:      66 inches Weight:      194 pounds BMI:     31.43 Temp:     98.1 degrees F Pulse rate:   57 / minute BP sitting:   130 / 63  (left arm) Cuff size:   regular  Vitals Entered By: Dennison Nancy RN (November 20, 2009 3:09 PM) CC: right achilles tendon pain Is Patient Diabetic? No Pain Assessment Patient in pain? yes     Location: r. achielles tendon Intensity: 7   Primary Care Provider:  Doralee Albino MD  CC:  right achilles tendon pain.  History of Present Illness: Lt knee painful.  Known osteoarthritis.  Recently had rt knee cortisone injection and he was quite pleased with the results.  He now wants left knee injected. Rt. Achilles painful.  Has tried stretching exercises.  Habits & Providers  Alcohol-Tobacco-Diet     Tobacco Status: quit     Year Quit: 2001  Current Medications (verified): 1)  Avodart 0.5 Mg Caps (Dutasteride) .... Take 1 Capsule By Mouth Every Night 2)  Cyclobenzaprine Hcl 5 Mg Tabs (Cyclobenzaprine Hcl) .Marland Kitchen.. 1 Tablet By Mouth Every Night 3)  Enalapril Maleate 20 Mg Tabs (Enalapril Maleate) .... Take 1 Tablet By Mouth Twice A Day 4)  Hydrocodone-Acetaminophen 10-500 Mg Tabs (Hydrocodone-Acetaminophen) .... One By Mouth Four Times Daily. 5)  Metoprolol Tartrate 50 Mg  Tabs (Metoprolol Tartrate) .... One Tab By Mouth  Two Times A Day 6)  Vytorin 10-40 Mg Tabs (Ezetimibe-Simvastatin) .... Take 1 Tablet By Mouth Once A Day 7)  Omeprazole 20 Mg Cpdr (Omeprazole) .... One Cap Daily 8)  Flomax 0.4 Mg Cp24 (Tamsulosin Hcl) .... Take 1 Capsule By Mouth Daily 9)  Colchicine 0.6 Mg Tabs (Colchicine) .... One By Mouth Daily 10)  Amlodipine Besylate 5 Mg Tabs (Amlodipine Besylate) .... One By Mouth Daily 11)  Aricept 5 Mg Tabs (Donepezil Hcl) .... One By Mouth Daily 12)  Namenda 10 Mg Tabs (Memantine Hcl) .... One By Mouth Bid 13)  Coumadin 5 Mg Tabs (Warfarin Sodium) .... Use  As Directed By Anticoagulation Clinic 14)  Capsaicin 0.025 % Crea (Capsaicin) .... Apply Twice Daily  Allergies (verified): No Known Drug Allergies  Past History:  Past medical, surgical, family and social histories (including risk factors) reviewed, and no changes noted (except as noted below).  Past Medical History: Reviewed history from 12/31/2008 and no changes required. 1. mini mental status=29.5/30 on 04/05/04 2.  minimal CAD by cath 1993 3. needs SBE prophylaxis 4. 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 TUNA for BPH 8/05 & 7/09 6. Lumbar spine disease 7.Hyperlipidemia 8. Hypertension 9. Valvular heart disease status post St. Jude aortic valve       replacement for aortic stenosis in 1993.  10. Status post cervical spine surgery.  Past Surgical History: Reviewed history from 06/16/2008 and no changes required. Aortic valve replacement -  C spine surg - 08/31/2003  Lt carpal tunnel surg - 01/01/2003  Rt. Knee arthroscopy -  Family History: Reviewed history from 06/16/2008 and no changes required. - DM, Ca, HBP, + CAD, ETOHism The patient's father died at 71 of probable ruptured   abdominal aortic aneurysm.  His mother died at age 17 from a stroke.   Social History: Reviewed history from 06/16/2008 and no changes required. smokes- quit 4/05, restarted: quit 3/06; no regular exercise; works part time in [employer]  Retired  Married   Physical Exam  General:  Well-developed,well-nourished,in no acute distress; alert,appropriate and cooperative throughout examination Msk:  Lt knee crepitis.  No effusion.  Ligaments intact.  Injected with 40 mg Kenalog and 3 cc xylocaine.  Patient tolerated well  Rt achilles minor swelling 4 cc above insertion.  No bruising or skin changes.     Impression & Recommendations:  Problem # 1:  OSTEOARTHROSIS, GENERALIZED, MULTIPLE SITES (ICD-715.09)  His updated medication list for this problem includes:    Hydrocodone-acetaminophen 10-500 Mg Tabs  (Hydrocodone-acetaminophen) ..... One by mouth four times daily.  Orders: [facility]- Est Level  3 (16109) Injection, large joint- [facility] (60454)  Problem # 2:  ACHILLES TENDINITIS (ICD-726.71)  Cont stretching exercises and pain meds.  Add capsecium cream.  Orders: [facility]- Est Level  3 (09811)  Complete Medication List: 1)  Avodart 0.5 Mg Caps (Dutasteride) .... Take 1 capsule by mouth every night 2)  Cyclobenzaprine Hcl 5 Mg Tabs (Cyclobenzaprine hcl) .Marland Kitchen.. 1 tablet by mouth every night 3)  Enalapril Maleate 20 Mg Tabs (Enalapril maleate) .... Take 1 tablet by mouth twice a day 4)  Hydrocodone-acetaminophen 10-500 Mg Tabs (Hydrocodone-acetaminophen) .... One by mouth four times daily. 5)  Metoprolol Tartrate 50 Mg Tabs (Metoprolol tartrate) .... One tab by mouth  two times a day 6)  Vytorin 10-40 Mg Tabs (Ezetimibe-simvastatin) .... Take 1 tablet by mouth once a day 7)  Omeprazole 20 Mg Cpdr (Omeprazole) .... One cap daily 8)  Flomax 0.4 Mg Cp24 (Tamsulosin hcl) .... Take 1 capsule by mouth daily 9)  Colchicine 0.6 Mg Tabs (Colchicine) .... One by mouth daily 10)  Amlodipine Besylate 5 Mg Tabs (Amlodipine besylate) .... One by mouth daily 11)  Aricept 5 Mg Tabs (Donepezil hcl) .... One by mouth daily 12)  Namenda 10 Mg Tabs (Memantine hcl) .... One by mouth bid 13)  Coumadin 5 Mg Tabs (Warfarin sodium) .... Use as directed by anticoagulation clinic 14)  Capsaicin 0.025 % Crea (Capsaicin) .... Apply twice daily

## 2010-06-01 NOTE — Letter (Signed)
Summary: [facility] - Request for Surgical Clearance  [facility] - Request for Surgical Clearance   Imported By: Marylou Mccoy 02/08/2010 07:47:01  _____________________________________________________________________  External Attachment:    Type:   Image     Comment:   External Document

## 2010-06-01 NOTE — Progress Notes (Signed)
Summary: phn msg  Phone Note Call from Patient Call back at Home Phone [phone]   Caller: Patient Summary of Call: still having problems w/ knee and thinks he needs shot - made appt for Wed 7/13 Initial call taken by: De Nurse,  November 09, 2009 10:35 AM  Follow-up for Phone Call        noted and agree Follow-up by: Doralee Albino MD,  November 09, 2009 12:16 PM

## 2010-06-01 NOTE — Medication Information (Signed)
Summary: rov/tm  Anticoagulant Therapy  Managed by: Weston Brass, PharmD Referring MD: Charlies Constable MD PCP: Doralee Albino MD Supervising MD: Daleen Squibb MD, Maisie Fus Indication 1: Aortic Valve Replacement (ICD-V43.3) Indication 2: St. Jude Valve Type (ICD-SJV) Lab Used: [facility] Site: [facility] INR POC 1.2 INR RANGE 2.5 - 3.5  Dietary changes: no    Health status changes: yes       Details: Prostate surgery a week ago  Bleeding/hemorrhagic complications: yes       Details: blood in urine secondary to surgery  Recent/future hospitalizations: yes       Details: surgery  Any changes in medication regimen? yes       Details: Took a 3 day course of Cipro   Recent/future dental: no  Any missed doses?: yes     Details: Missed about 8 doses due to surgery.   Is patient compliant with meds? yes       Allergies: No Known Drug Allergies  Anticoagulation Management History:      The patient is taking warfarin and comes in today for a routine follow up visit.  Positive risk factors for bleeding include an age of 75 years or older.  The bleeding index is 'intermediate risk'.  Positive CHADS2 values include History of HTN and Age > 75 years old.  The start date was 09/04/1997.  Anticoagulation responsible provider: Daleen Squibb MD, Maisie Fus.  INR POC: 1.2.  Cuvette Lot#: [id number].  Exp: 03/2011.    Anticoagulation Management Assessment/Plan:      The patient's current anticoagulation dose is Coumadin 5 mg tabs: Use as directed by anticoagulation clinic.  The target INR is 2.5 - 3.5.  The next INR is due 02/24/2010.  Anticoagulation instructions were given to patient.  Results were reviewed/authorized by Weston Brass, PharmD.  He was notified by Ilean Skill D candidate.         Prior Anticoagulation Instructions: 9/27- Last dose of Coumadin 9/28- No Coumadin or Lovenox 9/29- Start Lovenox 90mg  subcutaneously two times a day (pt has appt scheduled at 10:30 to instruct on injection technique) 10/2-  Take last lovenox injection in AM 10/3- Procedure Pt will resume Coumadin as directed by MD.   Current Anticoagulation Instructions: INR 1.2  Take 1 1/2 tablet today and tomorrow. Then continue taking same dose of 1 tablet everyday except 1/2 tablet on Friday. Recheck in 2 weeks.

## 2010-06-01 NOTE — Assessment & Plan Note (Signed)
Summary: leg/shoulder pain,df   Vital Signs:  Patient profile:   77 year old male Height:      66 inches Weight:      206.8 pounds BMI:     33.50 Temp:     97.7 degrees F oral Pulse rate:   64 / minute BP sitting:   168 / 68  (left arm) Cuff size:   regular  Vitals Entered By: Gladstone Pih (May 27, 2009 3:12 PM) CC: C/O Bilat leg pain and left shoulder pain, arthritis Is Patient Diabetic? No Pain Assessment Patient in pain? yes      Intensity: 6 Type: dull Onset of pain  chronic arthritis pain   Primary Care Provider:  Doralee Albino MD  CC:  C/O Bilat leg pain and left shoulder pain and arthritis.  History of Present Illness: C/O pain in both knees and Lt shoulder. Also new skin lesions on arms Note wt increase.  Not exercising as much due to knee pain.  Habits & Providers  Alcohol-Tobacco-Diet     Tobacco Status: never  Current Medications (verified): 1)  Avodart 0.5 Mg Caps (Dutasteride) .... Take 1 Capsule By Mouth Every Night 2)  Cyclobenzaprine Hcl 5 Mg Tabs (Cyclobenzaprine Hcl) .Marland Kitchen.. 1 Tablet By Mouth Every Night 3)  Enalapril Maleate 20 Mg Tabs (Enalapril Maleate) .... Take 1 Tablet By Mouth Twice A Day 4)  Hydrocodone-Acetaminophen 10-500 Mg Tabs (Hydrocodone-Acetaminophen) .... One By Mouth Four Times Daily. 5)  Metoprolol Tartrate 50 Mg  Tabs (Metoprolol Tartrate) .... One Tab By Mouth  Two Times A Day 6)  Vytorin 10-40 Mg Tabs (Ezetimibe-Simvastatin) .... Take 1 Tablet By Mouth Once A Day 7)  Omeprazole 20 Mg Cpdr (Omeprazole) .... One Cap Daily 8)  Flomax 0.4 Mg Cp24 (Tamsulosin Hcl) .... Take 1 Capsule By Mouth Daily 9)  Colchicine 0.6 Mg Tabs (Colchicine) .... One By Mouth Daily 10)  Amlodipine Besylate 5 Mg Tabs (Amlodipine Besylate) .... One By Mouth Daily 11)  Aricept 5 Mg Tabs (Donepezil Hydrochloride) .Marland Kitchen.. 1 By Mouth Daily 12)  Namenda 10 Mg Tabs (Memantine Hcl) .... One By Mouth Bid 13)  Coumadin 5 Mg Tabs (Warfarin Sodium) .... Use As  Directed By Anticoagulation Clinic  Allergies (verified): No Known Drug Allergies  Past History:  Past medical, surgical, family and social histories (including risk factors) reviewed, and no changes noted (except as noted below).  Past Medical History: Reviewed history from 12/31/2008 and no changes required. 1. mini mental status=29.5/30 on 04/05/04 2.  minimal CAD by cath 1993 3. needs SBE prophylaxis 4. 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 TUNA for BPH 8/05 & 7/09 6. Lumbar spine disease 7.Hyperlipidemia 8. Hypertension 9. Valvular heart disease status post St. Jude aortic valve       replacement for aortic stenosis in 1993.  10. Status post cervical spine surgery.  Past Surgical History: Reviewed history from 06/16/2008 and no changes required. Aortic valve replacement -  C spine surg - 08/31/2003  Lt carpal tunnel surg - 01/01/2003  Rt. Knee arthroscopy -  Family History: Reviewed history from 06/16/2008 and no changes required. - DM, Ca, HBP, + CAD, ETOHism The patient's father died at 65 of probable ruptured   abdominal aortic aneurysm.  His mother died at age 66 from a stroke.   Social History: Reviewed history from 06/16/2008 and no changes required. smokes- quit 4/05, restarted: quit 3/06; no regular exercise; works part time in [employer] Retired  Married  Smoking Status:  never  Physical Exam  General:  Well-developed,well-nourished,in no acute distress; alert,appropriate and cooperative throughout examination Msk:  + crepitis both knees Lt shoulder pos impingement.   Impression & Recommendations:  Problem # 1:  OSTEOARTHROSIS, GENERALIZED, MULTIPLE SITES (ICD-715.09) Assessment Deteriorated  Increase pain med so he can be more active His updated medication list for this problem includes:    Hydrocodone-acetaminophen 10-500 Mg Tabs (Hydrocodone-acetaminophen) ..... One by mouth four times daily.  Orders: [facility]- Est  Level 4 (99214)  Problem # 2:  DEMENTIA  (ICD-294.8) Assessment: Improved  Mentating better on current meds  Orders: [facility]- Est  Level 4 (16109)  Problem # 3:  ROTATOR CUFF TENDONITIS (ICD-726.10) Assessment: Deteriorated  Inject shoulder.  Good immediate response  Orders: Injection, large joint- [facility] (20610)  Problem # 4:  HYPERTENSION, BENIGN SYSTEMIC (ICD-401.1) Assessment: Deteriorated  Increase activity and lose wt.  He has been successful before - see last visit. His updated medication list for this problem includes:    Enalapril Maleate 20 Mg Tabs (Enalapril maleate) .Marland Kitchen... Take 1 tablet by mouth twice a day    Metoprolol Tartrate 50 Mg Tabs (Metoprolol tartrate) ..... One tab by mouth  two times a day    Amlodipine Besylate 5 Mg Tabs (Amlodipine besylate) ..... One by mouth daily  Orders: [facility]- Est  Level 4 (99214)  Problem # 5:  HYPERCHOLESTEROLEMIA (ICD-272.0) Assessment: Unchanged Due for check His updated medication list for this problem includes:    Vytorin 10-40 Mg Tabs (Ezetimibe-simvastatin) .Marland Kitchen... Take 1 tablet by mouth once a day  Orders: Lipid-[facility] (60454-09811) Comp Met-[facility] (91478-29562) [facility]- Est  Level 4 (13086)  Problem # 6:  CHRONIC KIDNEY DISEASE STAGE II (MILD) (ICD-585.2)  Check creat.  Orders: [facility]- Est  Level 4 (99214)  Complete Medication List: 1)  Avodart 0.5 Mg Caps (Dutasteride) .... Take 1 capsule by mouth every night 2)  Cyclobenzaprine Hcl 5 Mg Tabs (Cyclobenzaprine hcl) .Marland Kitchen.. 1 tablet by mouth every night 3)  Enalapril Maleate 20 Mg Tabs (Enalapril maleate) .... Take 1 tablet by mouth twice a day 4)  Hydrocodone-acetaminophen 10-500 Mg Tabs (Hydrocodone-acetaminophen) .... One by mouth four times daily. 5)  Metoprolol Tartrate 50 Mg Tabs (Metoprolol tartrate) .... One tab by mouth  two times a day 6)  Vytorin 10-40 Mg Tabs (Ezetimibe-simvastatin) .... Take 1 tablet by mouth once a day 7)  Omeprazole 20 Mg Cpdr (Omeprazole) .... One cap daily 8)  Flomax 0.4 Mg Cp24 (Tamsulosin  hcl) .... Take 1 capsule by mouth daily 9)  Colchicine 0.6 Mg Tabs (Colchicine) .... One by mouth daily 10)  Amlodipine Besylate 5 Mg Tabs (Amlodipine besylate) .... One by mouth daily 11)  Aricept 5 Mg Tabs (Donepezil hydrochloride) .Marland Kitchen.. 1 by mouth daily 12)  Namenda 10 Mg Tabs (Memantine hcl) .... One by mouth bid 13)  Coumadin 5 Mg Tabs (Warfarin sodium) .... Use as directed by anticoagulation clinic Prescriptions: HYDROCODONE-ACETAMINOPHEN 10-500 MG TABS (HYDROCODONE-ACETAMINOPHEN) one by mouth four times daily.  #120 x 1   Entered and Authorized by:   Doralee Albino MD   Signed by:   Doralee Albino MD on 05/27/2009   Method used:   Handwritten   RxID:   [id number]    Prevention & Chronic Care Immunizations   Influenza vaccine: Fluvax MCR  (02/11/2009)   Influenza vaccine due: 06/11/2009    Tetanus booster: 06/03/1999: Done.   Tetanus booster due: 06/02/2009    Pneumococcal vaccine: Done.  (04/01/2001)   Pneumococcal vaccine due: None    H. zoster vaccine:  Not documented  Colorectal Screening   Hemoccult: normal  (12/14/2007)   Hemoccult action/deferral: Not indicated  (05/27/2009)   Hemoccult due: 12/13/2008    Colonoscopy: Not documented   Colonoscopy due: Not Indicated  Other Screening   PSA: 0.67  (11/13/2007)   PSA due due: Not Indicated   Smoking status: never  (05/27/2009)  Lipids   Total Cholesterol: 267  (06/11/2008)   LDL: 167  (06/11/2008)   LDL Direct: Not documented   HDL: 35  (06/11/2008)   Triglycerides: 325  (06/11/2008)    SGOT (AST): 23  (06/11/2008)   SGPT (ALT): 16  (06/11/2008) CMP ordered    Alkaline phosphatase: 49  (06/11/2008)   Total bilirubin: 1.0  (06/11/2008)    Lipid flowsheet reviewed?: Yes   Progress toward LDL goal: Deteriorated  Hypertension   Last Blood Pressure: 168 / 68  (05/27/2009)   Serum creatinine: 1.2  (01/01/2009)   Serum potassium 5.2  (01/01/2009) CMP ordered     Hypertension flowsheet reviewed?:  Yes   Progress toward BP goal: Deteriorated  Self-Management Support :   Personal Goals (by the next clinic visit) :      Personal blood pressure goal: 140/90  (01/21/2009)     Personal LDL goal: 100  (01/21/2009)    Hypertension self-management support: Written self-care plan  (05/27/2009)   Hypertension self-care plan printed.    Lipid self-management support: Written self-care plan  (05/27/2009)   Lipid self-care plan printed.

## 2010-06-01 NOTE — Medication Information (Signed)
Summary: rov/tm  Anticoagulant Therapy  Managed by: Weston Brass, PharmD Referring MD: Charlies Constable MD PCP: Doralee Albino MD Supervising MD: Tenny Craw MD, Gunnar Fusi Indication 1: Aortic Valve Replacement (ICD-V43.3) Indication 2: St. Jude Valve Type (ICD-SJV) Lab Used: [facility] Site: [facility] INR POC 2.9 INR RANGE 2.5 - 3.5  Dietary changes: no    Health status changes: no    Bleeding/hemorrhagic complications: no    Recent/future hospitalizations: no    Any changes in medication regimen? no    Recent/future dental: no  Any missed doses?: no       Is patient compliant with meds? yes       Allergies: No Known Drug Allergies  Anticoagulation Management History:      The patient is taking warfarin and comes in today for a routine follow up visit.  Positive risk factors for bleeding include an age of 40 years or older.  The bleeding index is 'intermediate risk'.  Positive CHADS2 values include History of HTN and Age > 28 years old.  The start date was 09/04/1997.  Anticoagulation responsible provider: Tenny Craw MD, Gunnar Fusi.  INR POC: 2.9.  Cuvette Lot#: [id number].  Exp: 12/2010.    Anticoagulation Management Assessment/Plan:      The patient's current anticoagulation dose is Coumadin 5 mg tabs: Use as directed by anticoagulation clinic.  The target INR is 2.5 - 3.5.  The next INR is due 11/05/2009.  Anticoagulation instructions were given to patient.  Results were reviewed/authorized by Weston Brass, PharmD.  He was notified by Weston Brass PharmD.         Prior Anticoagulation Instructions: INR 3.0 Continue 5mg s everyday except 2.5mg s on Fridays. Recheck in 4 weeks.   Current Anticoagulation Instructions: INR 2.9  Continue same dose of 1 tablet every day except 1/2 tablet on Friday.

## 2010-06-01 NOTE — Miscellaneous (Signed)
Summary: med list   Clinical Lists Changes  Medications: Changed medication from ARICEPT 5 MG TABS (DONEPEZIL HYDROCHLORIDE) 1 by mouth daily to ARICEPT 5 MG TABS (DONEPEZIL HCL) one by mouth daily

## 2010-06-01 NOTE — Miscellaneous (Signed)
  Clinical Lists Changes 

## 2010-06-01 NOTE — Progress Notes (Signed)
Summary: Rx Ques  Phone Note Call from Patient Call back at Home Phone [phone]   Caller: Patient Summary of Call: Says that the Aricept was denied and was checking on this.   Initial call taken by: Clydell Hakim,  June 26, 2009 12:09 PM  Follow-up for Phone Call        pharmacy states Rx is there ready to pick up. notified wife. she also was just wondering since he is taking the namenda twice daily should he take  aricept twice . explained that different meds  have certain ways they are to be taken and Aricept is once daily med. she voices understanding. Follow-up by: Theresia Lo RN,  June 26, 2009 12:38 PM

## 2010-06-01 NOTE — Medication Information (Signed)
Summary: rov/tm  Anticoagulant Therapy  Managed by: Bethena Midget, RN, BSN Referring MD: Charlies Constable MD PCP: Doralee Albino MD Supervising MD: Eden Emms MD, Theron Arista Indication 1: Aortic Valve Replacement (ICD-V43.3) Indication 2: St. Jude Valve Type (ICD-SJV) Lab Used: [facility] Site: [facility] INR POC 3.4 INR RANGE 2.5 - 3.5  Dietary changes: no    Health status changes: no    Bleeding/hemorrhagic complications: no    Recent/future hospitalizations: no    Any changes in medication regimen? no    Recent/future dental: no  Any missed doses?: no       Is patient compliant with meds? yes      Comments: Pt having alot of pain in left shoulder, saw PCP 2 weeks ago received Cortisone shot, MD stated if not better come back. Pt states he fell on shoulder twice in one week, that's how he injuried but not better thus will follow with PCP.   Allergies: No Known Drug Allergies  Anticoagulation Management History:      The patient is taking warfarin and comes in today for a routine follow up visit.  Positive risk factors for bleeding include an age of 75 years or older.  The bleeding index is 'intermediate risk'.  Positive CHADS2 values include History of HTN and Age > 46 years old.  The start date was 09/04/1997.  Anticoagulation responsible provider: Eden Emms MD, Theron Arista.  INR POC: 3.4.  Cuvette Lot#: [id number].  Exp: 04/2011.    Anticoagulation Management Assessment/Plan:      The patient's current anticoagulation dose is Coumadin 5 mg tabs: Use as directed by anticoagulation clinic.  The target INR is 2.5 - 3.5.  The next INR is due 04/29/2010.  Anticoagulation instructions were given to patient.  Results were reviewed/authorized by Bethena Midget, RN, BSN.  He was notified by Bethena Midget, RN, BSN.         Prior Anticoagulation Instructions: INR 2.9 Continue 5mg s everyday except 2.5mg s on Fridays. Recheck in 3 weeks.   Current Anticoagulation Instructions: INR 3.4 Continue 5mg s  everyday except 2.5mg s on Fridays. Recheck in4 weeks.

## 2010-06-01 NOTE — Medication Information (Signed)
Summary: ROV/LB  Anticoagulant Therapy  Managed by: Charolotte Eke, PharmD Referring MD: Charlies Constable MD PCP: Doralee Albino MD Supervising MD: Graciela Husbands MD, Viviann Spare Indication 1: Aortic Valve Replacement (ICD-V43.3) Indication 2: St. Jude Valve Type (ICD-SJV) Lab Used: [facility] Site: [facility] INR POC 3.1 INR RANGE 2.5 - 3.5  Dietary changes: no    Health status changes: no    Bleeding/hemorrhagic complications: no    Recent/future hospitalizations: no    Any changes in medication regimen? no    Recent/future dental: no  Any missed doses?: no       Is patient compliant with meds? yes       Current Medications (verified): 1)  Avodart 0.5 Mg Caps (Dutasteride) .... Take 1 Capsule By Mouth Every Night 2)  Cyclobenzaprine Hcl 5 Mg Tabs (Cyclobenzaprine Hcl) .Marland Kitchen.. 1 Tablet By Mouth Every Night 3)  Enalapril Maleate 20 Mg Tabs (Enalapril Maleate) .... Take 1 Tablet By Mouth Twice A Day 4)  Hydrocodone-Acetaminophen 10-500 Mg Tabs (Hydrocodone-Acetaminophen) .... One By Mouth Four Times Daily. 5)  Metoprolol Tartrate 50 Mg  Tabs (Metoprolol Tartrate) .... One Tab By Mouth  Two Times A Day 6)  Vytorin 10-40 Mg Tabs (Ezetimibe-Simvastatin) .... Take 1 Tablet By Mouth Once A Day 7)  Omeprazole 20 Mg Cpdr (Omeprazole) .... One Cap Daily 8)  Flomax 0.4 Mg Cp24 (Tamsulosin Hcl) .... Take 1 Capsule By Mouth Daily 9)  Colchicine 0.6 Mg Tabs (Colchicine) .... One By Mouth Daily 10)  Amlodipine Besylate 5 Mg Tabs (Amlodipine Besylate) .... One By Mouth Daily 11)  Aricept 5 Mg Tabs (Donepezil Hydrochloride) .Marland Kitchen.. 1 By Mouth Daily 12)  Namenda 10 Mg Tabs (Memantine Hcl) .... One By Mouth Bid 13)  Coumadin 5 Mg Tabs (Warfarin Sodium) .... Use As Directed By Anticoagulation Clinic  Allergies (verified): No Known Drug Allergies  Anticoagulation Management History:      The patient is taking warfarin and comes in today for a routine follow up visit.  Positive risk factors for bleeding  include an age of 14 years or older.  The bleeding index is 'intermediate risk'.  Positive CHADS2 values include History of HTN and Age > 90 years old.  The start date was 09/04/1997.  Anticoagulation responsible provider: Graciela Husbands MD, Viviann Spare.  INR POC: 3.1.  Cuvette Lot#: [id number].  Exp: 08/2010.    Anticoagulation Management Assessment/Plan:      The patient's current anticoagulation dose is Coumadin 5 mg tabs: Use as directed by anticoagulation clinic.  The target INR is 2.5 - 3.5.  The next INR is due 06/25/2009.  Anticoagulation instructions were given to patient.  Results were reviewed/authorized by Charolotte Eke, PharmD.  He was notified by Charolotte Eke, PharmD.         Prior Anticoagulation Instructions: INR 3.0  CONTINUE TAKING 1 TABLET DAILY EXCEPT TAKE 0.5 TABLETS ON FRIDAYS.  RECHECK IN 4 WEEKS.  Current Anticoagulation Instructions: Continue 5mg  daily except 2.5mg  on Fridays.

## 2010-06-01 NOTE — Medication Information (Signed)
Summary: rov/ewj  Anticoagulant Therapy  Managed by: Cloyde Reams, RN, BSN Referring MD: Charlies Constable MD PCP: Doralee Albino MD Supervising MD: Graciela Husbands MD, Viviann Spare Indication 1: Aortic Valve Replacement (ICD-V43.3) Indication 2: St. Jude Valve Type (ICD-SJV) Lab Used: [facility] Site: [facility] INR POC 2.4 INR RANGE 2.5 - 3.5  Dietary changes: no    Health status changes: no    Bleeding/hemorrhagic complications: no    Recent/future hospitalizations: no    Any changes in medication regimen? no    Recent/future dental: no  Any missed doses?: no       Is patient compliant with meds? yes       Allergies: No Known Drug Allergies  Anticoagulation Management History:      The patient is taking warfarin and comes in today for a routine follow up visit.  Positive risk factors for bleeding include an age of 75 years or older.  The bleeding index is 'intermediate risk'.  Positive CHADS2 values include History of HTN and Age > 75 years old.  The start date was 09/04/1997.  Anticoagulation responsible provider: Graciela Husbands MD, Viviann Spare.  INR POC: 2.4.  Cuvette Lot#: [id number].  Exp: 01/2011.    Anticoagulation Management Assessment/Plan:      The patient's current anticoagulation dose is Coumadin 5 mg tabs: Use as directed by anticoagulation clinic.  The target INR is 2.5 - 3.5.  The next INR is due 01/28/2010.  Anticoagulation instructions were given to patient.  Results were reviewed/authorized by Cloyde Reams, RN, BSN.  He was notified by Cloyde Reams RN.         Prior Anticoagulation Instructions: INR 3.0  Continue on same dosage 1 tablet daily except 1/2 tablet on Fridays.  Recheck in 4 weeks.    Current Anticoagulation Instructions: INR 2.4  Take 1.5 tablets today, then resume same dosage 1 tablet daily except 1/2 tablet on Fridays.  Recheck in 4 weeks.

## 2010-06-03 NOTE — Medication Information (Signed)
Summary: rov/tm  Anticoagulant Therapy  Managed by: Bethena Midget, RN, BSN Referring MD: Charlies Constable MD PCP: Doralee Albino MD Supervising MD: Jens Som MD, Arlys Christoph Indication 1: Aortic Valve Replacement (ICD-V43.3) Indication 2: St. Jude Valve Type (ICD-SJV) Lab Used: [facility] Site: [facility] INR POC 2.3 INR RANGE 2.5 - 3.5  Dietary changes: no    Health status changes: yes       Details: Left shoulder Rotator Cuff repair s/c for 05/11/10 Dr Cathlean Cower  Bleeding/hemorrhagic complications: no    Recent/future hospitalizations: no    Any changes in medication regimen? no    Recent/future dental: no  Any missed doses?: yes     Details: he states after fall he probably did get his dose mixed up  Is patient compliant with meds? yes      Comments: Fell at home approx 2 wks ago, that's when he tore rotator cuff.   Allergies: No Known Drug Allergies  Anticoagulation Management History:      The patient is taking warfarin and comes in today for a routine follow up visit.  Positive risk factors for bleeding include an age of 75 years or older.  The bleeding index is 'intermediate risk'.  Positive CHADS2 values include History of HTN and Age > 62 years old.  The start date was 09/04/1997.  Anticoagulation responsible provider: Jens Som MD, Arlys Jassen.  INR POC: 2.3.  Cuvette Lot#: [id number].  Exp: 06/2010.    Anticoagulation Management Assessment/Plan:      The patient's current anticoagulation dose is Coumadin 5 mg tabs: Use as directed by anticoagulation clinic.  The target INR is 2.5 - 3.5.  The next INR is due 05/17/2010.  Anticoagulation instructions were given to patient.  Results were reviewed/authorized by Bethena Midget, RN, BSN.  He was notified by Bethena Midget, RN, BSN.         Prior Anticoagulation Instructions: INR 3.4 Continue 5mg s everyday except 2.5mg s on Fridays. Recheck in4 weeks.   Current Anticoagulation Instructions: INR 2.3 Today take 1.5 pills then resume 1 pill  everyday except 1/2 pill on Friday.   Take last dose on 05/06/10 of coumadin, no more til after surgery!!! 05/07/10- No coumadin or Lovenox 05/08/10- Take 1 Lovenox 80mg  injection  at 9am, Then take Lovenox 80mg  injection at 9pm.  05/09/10-  Take 1 Lovenox 80mg  injection  at 9am, Then take Lovenox 80mg  injection at 9pm. 05/10/10- Take last Lovenox injection at 9am..... 05/11/10- Date of Surgery.  After Surgery doctor should give instruction about when to restart Coumadin and Lovenox.  Prescriptions: ENOXAPARIN SODIUM 80 MG/0.8ML SOLN (ENOXAPARIN SODIUM) Take 1 injection (80mg ) every 12 hours  #20 x 0   Entered by:   Bethena Midget, RN, BSN   Authorized by:   [name], MD   Signed by:   Bethena Midget, RN, BSN on 04/29/2010   Method used:   Electronically to        [facility]  [address] [id number] (retail)       [address]       [place], [place]  [address]       Ph: [phone] or [phone]       Fax: [phone]   RxID:   [id number]

## 2010-06-03 NOTE — Miscellaneous (Signed)
Summary: med refill via fax request  Clinical Lists Changes  Medications: Changed medication from HYDROCODONE-ACETAMINOPHEN 10-500 MG TABS (HYDROCODONE-ACETAMINOPHEN) one by mouth four times daily. to HYDROCODONE-ACETAMINOPHEN 10-500 MG TABS (HYDROCODONE-ACETAMINOPHEN) one by mouth four times daily. - Signed Rx of HYDROCODONE-ACETAMINOPHEN 10-500 MG TABS (HYDROCODONE-ACETAMINOPHEN) one by mouth four times daily.;  #360 x 3;  Signed;  Entered by: Doralee Albino MD;  Authorized by: Doralee Albino MD;  Method used: Handwritten    Prescriptions: HYDROCODONE-ACETAMINOPHEN 10-500 MG TABS (HYDROCODONE-ACETAMINOPHEN) one by mouth four times daily.  #360 x 3   Entered and Authorized by:   Doralee Albino MD   Signed by:   Doralee Albino MD on 05/06/2010   Method used:   Handwritten   RxID:   [id number]

## 2010-06-03 NOTE — Progress Notes (Signed)
  Phone Note Call from Patient   Caller: Spouse Call For: [phone] Summary of Call: Mrs. Ndiaye want to know why Mr. Eichinger is taking 3 bp meds.  Enalapril 20mg , Metroprolol 50mg  and Amlodipine 10mg .  Dr. Juanda Chance increased the latter from 72m to 10mg .  Vytorin will be okay to change to whatever you deem appropriate. Initial call taken by: Abundio Miu,  May 05, 2010 12:03 PM  Follow-up for Phone Call        Called and explained he needs the three meds to control his BP.  Given recent excellent cholesterol, will switch to simvastatin alone and recheck chol in three months. Follow-up by: Doralee Albino MD,  May 05, 2010 12:20 PM    New/Updated Medications: SIMVASTATIN 20 MG TABS (SIMVASTATIN) one by mouth at bedtime (for high cholesterol, replaces vytorin) Prescriptions: SIMVASTATIN 20 MG TABS (SIMVASTATIN) one by mouth at bedtime (for high cholesterol, replaces vytorin)  #90 x 3   Entered and Authorized by:   Doralee Albino MD   Signed by:   Doralee Albino MD on 05/05/2010   Method used:   [facility]  [id number]       [address]       [place], [place]  [address]       Ph: [phone] or [phone]       Fax: [phone]   RxID:   [id number]

## 2010-06-03 NOTE — Medication Information (Signed)
Summary: rov/tm  Anticoagulant Therapy  Managed by: Weston Brass, PharmD Referring MD: Charlies Constable MD PCP: Doralee Albino MD Supervising MD: Daleen Squibb MD, Maisie Fus Indication 1: Aortic Valve Replacement (ICD-V43.3) Indication 2: St. Jude Valve Type (ICD-SJV) Lab Used: [facility] Site: [facility] INR POC 2.1 INR RANGE 2.5 - 3.5  Dietary changes: no    Health status changes: no    Bleeding/hemorrhagic complications: no    Recent/future hospitalizations: yes       Details: Surgery on rotator cuff on 1/10, resumed coumadin on 1/11  Any changes in medication regimen? no    Recent/future dental: no  Any missed doses?: no       Is patient compliant with meds? yes       Allergies: No Known Drug Allergies  Anticoagulation Management History:      The patient is taking warfarin and comes in today for a routine follow up visit.  Positive risk factors for bleeding include an age of 75 years or older.  The bleeding index is 'intermediate risk'.  Positive CHADS2 values include History of HTN and Age > 14 years old.  The start date was 09/04/1997.  Anticoagulation responsible provider: Daleen Squibb MD, Maisie Fus.  INR POC: 2.1.  Cuvette Lot#: [id number].  Exp: 06/2011.    Anticoagulation Management Assessment/Plan:      The patient's current anticoagulation dose is Coumadin 5 mg tabs: Use as directed by anticoagulation clinic.  The target INR is 2.5 - 3.5.  The next INR is due 06/07/2010.  Anticoagulation instructions were given to patient.  Results were reviewed/authorized by Weston Brass, PharmD.  He was notified by Stephannie Peters, PharmD Candidate .         Prior Anticoagulation Instructions: INR 2.3 Today take 1.5 pills then resume 1 pill everyday except 1/2 pill on Friday.   Take last dose on 05/06/10 of coumadin, no more til after surgery!!! 05/07/10- No coumadin or Lovenox 05/08/10- Take 1 Lovenox 80mg  injection  at 9am, Then take Lovenox 80mg  injection at 9pm.  05/09/10-  Take 1 Lovenox 80mg  injection  at  9am, Then take Lovenox 80mg  injection at 9pm. 05/10/10- Take last Lovenox injection at 9am..... 05/11/10- Date of Surgery.  After Surgery doctor should give instruction about when to restart Coumadin and Lovenox.   Current Anticoagulation Instructions: INR 2.1  Coumadin 5 mg tablets - Continue 1 tablet every day except 1/2 tablet on Fridays

## 2010-06-03 NOTE — Assessment & Plan Note (Signed)
Summary: surgical clearance   Visit Type:  Follow-up Primary Provider:  Doralee Albino MD   History of Present Illness: The patient is 77 years and then returned today for a preoperative evaluation prior to shoulder surgery and for management of his prosthetic aortic valve. In 1993 he had a St. Jude aortic valve replacement. He had nonobstructive coronary disease at that time. He is done well since that time has had no recent chest pain shortness of breath or palpitations.  I saw him in  preop consultation for a TURP in September of 2011 and we did an echocardiogram after that visit.    We bridged him  with Lovenox for that surgery.  His other problems include hypertension and hyperlipidemia. He also has a positive family history for abdominal aortic aneurysm and had a ultrasound today which was negative.  His shoulder injury occurred as a result of the recent fall.  He has had no recent chest pain shortness of breath or palpitations.  Current Medications (verified): 1)  Avodart 0.5 Mg Caps (Dutasteride) .... Take 1 Capsule By Mouth Every Night 2)  Enalapril Maleate 20 Mg Tabs (Enalapril Maleate) .... Take 1 Tablet By Mouth Twice A Day 3)  Hydrocodone-Acetaminophen 10-500 Mg Tabs (Hydrocodone-Acetaminophen) .... One By Mouth Four Times Daily. 4)  Metoprolol Tartrate 50 Mg  Tabs (Metoprolol Tartrate) .... One Tab By Mouth  Two Times A Day 5)  Omeprazole 20 Mg Cpdr (Omeprazole) .... One Cap Daily 6)  Flomax 0.4 Mg Cp24 (Tamsulosin Hcl) .... Take 1 Capsule By Mouth Daily 7)  Amlodipine Besylate 5 Mg Tabs (Amlodipine Besylate) .... One By Mouth Daily 8)  Aricept 5 Mg Tabs (Donepezil Hcl) .... One By Mouth Daily 9)  Namenda 10 Mg Tabs (Memantine Hcl) .... One By Mouth Bid 10)  Coumadin 5 Mg Tabs (Warfarin Sodium) .... Use As Directed By Anticoagulation Clinic 11)  Vytorin 10-20 Mg Tabs (Ezetimibe-Simvastatin) .Marland Kitchen.. 1 By Mouth At Night For Cholesterol.  Take Instead of Your Higher Dose of  Vytorin. 12)  Enoxaparin Sodium 100 Mg/ml Soln (Enoxaparin Sodium) .... Inject 90mg  Subcutaneously Two Times A Day As Directed 13)  Cyclobenzaprine Hcl 5 Mg Tabs (Cyclobenzaprine Hcl) .... One By Mouth At Bedtime  Allergies (verified): No Known Drug Allergies  Past History:  Past Medical History: Reviewed history from 01/19/2010 and no changes required. 1. mini mental status=29.5/30 on 04/05/04 2.  minimal CAD by cath 1993 3. needs SBE prophylaxis 4. 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 TUNA for BPH 8/05 & 7/09 6. Lumbar spine disease 7.Hyperlipidemia 8. Hypertension 9. Valvular heart disease status post St. Jude aortic valve       replacement for aortic stenosis in 1993.  10. Status post cervical spine surgery. 11.Gout 12.Gait disturbance  Review of Systems       ROS is negative except as outlined in HPI.   Vital Signs:  Patient profile:   77 year old male Height:      66 inches Weight:      190 pounds BMI:     30.78 Pulse rate:   76 / minute BP sitting:   186 / 82  (left arm)  Vitals Entered By: Laurance Flatten CMA (April 22, 2010 3:22 PM)  Physical Exam  Additional Exam:  Gen. Well-nourished, in no distress   Neck: No JVD, thyroid not enlarged, no carotid bruits Lungs: No tachypnea, clear without rales, rhonchi or wheezes Cardiovascular: Rhythm regular, PMI not displaced,  normal prosthetic closure sound  normal, no murmurs or  gallops, no peripheral edema, pulses normal in all 4 extremities. Abdomen: BS normal, abdomen soft and non-tender without masses or organomegaly, no hepatosplenomegaly. MS: No deformities, no cyanosis or clubbing   Neuro:  No focal sns   Skin:  no lesions    Impression & Recommendations:  Problem # 1:  PREOPERATIVE EXAMINATION (ICD-V72.84) He is scheduled for shoulder surgery in the near future. His cardiac condition has been stable and I think he should be okay for surgery. He recently went through another surgery for his prostate. He bridging  with Lovenox because he has a prosthetic aortic valve. We will arrange this through our Coumadin clinic as soon as we know the date of surgery.  Problem # 2:  HEART VALVE REPLACEMENT, HX OF (ICD-V15.1) He Is status post aortic valve replacement with a St. Jude's valve. This is stable but does require bridging with Lovenox when we stop his Coumadin for his upcoming surgery.  Problem # 3:  HYPERTENSION, BENIGN SYSTEMIC (ICD-401.1) His blood pressure is quite elevated today. Will increase his amlodipine to 10 mg daily. We will recheck his blood pressure when he comes to the office for a protime check. His updated medication list for this problem includes:    Enalapril Maleate 20 Mg Tabs (Enalapril maleate) .Marland Kitchen... Take 1 tablet by mouth twice a day    Metoprolol Tartrate 50 Mg Tabs (Metoprolol tartrate) ..... One tab by mouth  two times a day    Amlodipine Besylate 10 Mg Tabs (Amlodipine besylate) .Marland Kitchen... Take one tablet by mouth daily  Patient Instructions: 1)  You are being cleared for your surgery and we will let Dr. Darrelyn Hillock know this. 2)  Please let us know when your surgery is scheduled for so we can set you up for lovenox bridging. 3)  Increase amlodipine to 10mg  once daily. 4)  You will need a blood pressure check with your next coumadin check on 04/29/10. Prescriptions: AMLODIPINE BESYLATE 10 MG TABS (AMLODIPINE BESYLATE) Take one tablet by mouth daily  #30 x 11   Entered by:   Sherri Rad, RN, BSN   Authorized by:   Lenoria Farrier, MD, [facility]   Signed by:   Sherri Rad, RN, BSN on 04/22/2010   Method used:   [facility]  [address] [id number]       [address]       [place], [place]  [address]       Ph: [phone] or [phone]       Fax: [phone]   RxID:   [id number]

## 2010-06-03 NOTE — Consult Note (Signed)
Summary: [facility]   Imported By: Bradly Bienenstock 04/30/2010 09:10:42  _____________________________________________________________________  External Attachment:    Type:   Image     Comment:   External Document

## 2010-06-03 NOTE — Assessment & Plan Note (Signed)
Summary: B2W- surgical clearance   Visit Type:  1 year follow up Primary Provider:  Doralee Albino MD  CC:  Surgical clearance. Prostate.  History of Present Illness: The patient is 77 years and then returned today for a preoperative evaluation prior to prostate surgery and for management of his prosthetic aortic valve. In 1993 he had a St. Jude aortic valve replacement. He had nonobstructive coronary disease at that time. He is done well since that time has had no recent chest pain shortness of breath or palpitations.  His other problems include hypertension and hyperlipidemia. He also has a positive family history for abdominal aortic aneurysm and had a ultrasound today which was negative.  He is refractory to medical therapy with BPH and is in need of a TURP. He is here for clearance today.  Current Medications (verified): 1)  Avodart 0.5 Mg Caps (Dutasteride) .... Take 1 Capsule By Mouth Every Night 2)  Enalapril Maleate 20 Mg Tabs (Enalapril Maleate) .... Take 1 Tablet By Mouth Twice A Day 3)  Hydrocodone-Acetaminophen 10-500 Mg Tabs (Hydrocodone-Acetaminophen) .... One By Mouth Four Times Daily. 4)  Metoprolol Tartrate 50 Mg  Tabs (Metoprolol Tartrate) .... One Tab By Mouth  Two Times A Day 5)  Omeprazole 20 Mg Cpdr (Omeprazole) .... One Cap Daily 6)  Flomax 0.4 Mg Cp24 (Tamsulosin Hcl) .... Take 1 Capsule By Mouth Daily 7)  Amlodipine Besylate 5 Mg Tabs (Amlodipine Besylate) .... One By Mouth Daily 8)  Aricept 5 Mg Tabs (Donepezil Hcl) .... One By Mouth Daily 9)  Namenda 10 Mg Tabs (Memantine Hcl) .... One By Mouth Bid 10)  Coumadin 5 Mg Tabs (Warfarin Sodium) .... Use As Directed By Anticoagulation Clinic 11)  Vytorin 10-20 Mg Tabs (Ezetimibe-Simvastatin) .Marland Kitchen.. 1 By Mouth At Night For Cholesterol.  Take Instead of Your Higher Dose of Vytorin.  Allergies (verified): No Known Drug Allergies  Past History:  Past Medical History: Reviewed history from 12/31/2008 and no changes  required. 1. mini mental status=29.5/30 on 04/05/04 2.  minimal CAD by cath 1993 3. needs SBE prophylaxis 4. 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 TUNA for BPH 8/05 & 7/09 6. Lumbar spine disease 7.Hyperlipidemia 8. Hypertension 9. Valvular heart disease status post St. Jude aortic valve       replacement for aortic stenosis in 1993.  10. Status post cervical spine surgery.  Family History: Reviewed history from 06/16/2008 and no changes required. - DM, Ca, HBP, + CAD, ETOHism The patient's father died at 65 of probable ruptured   abdominal aortic aneurysm.  His mother died at age 52 from a stroke.   Social History: Reviewed history from 06/16/2008 and no changes required. 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 Well-nourished, in no distress   Neck: No JVD, thyroid not enlarged, no carotid bruits Lungs: No tachypnea, clear without rales, rhonchi or wheezes Cardiovascular: Rhythm regular, PMI not displaced,  heart sounds  normal, no  murmurs or gallops, no peripheral edema, pulses normal in all 4 extremities. Abdomen: BS normal, abdomen soft and non-tender without masses or organomegaly, no hepatosplenomegaly. MS: No deformities, no cyanosis or clubbing   Neuro:  No focal sns   Skin:  no lesions    Impression & Recommendations:  Problem # 1:  PREOPERATIVE EXAMINATION (ICD-V72.84) Dr. Isabel Caprice has recommended a TURP. His cardiac condition is stable and has had no recent chest pain shortness of breath or palpitations. He has a St. Jude aortic prosthesis and the need to be off  Coumadin. He should return with Lovenox prior to surgery but after surgery he will probably need to be off for 7-10 days. He would be at some risk bowel thrombosis on with a St. Jude's valve and risk is much less than that of other valves. He understands this risk in her diet to proceed with the surgery.  Problem # 2:  HEART VALVE REPLACEMENT, HX OF (ICD-V15.1) He has a Paediatric nurse replacement. He has had no cardiac symptoms and this appears stable. He had an echocardiogram today. By my interpretation he had normal chamber sizes and normal LV function. His left ventricular wall thickness appeared slightly increased.  Problem # 3:  HYPERTENSION, BENIGN SYSTEMIC (ICD-401.1) This is well controlled on current medications. His updated medication list for this problem includes:    Enalapril Maleate 20 Mg Tabs (Enalapril maleate) .Marland Kitchen... Take 1 tablet by mouth twice a day    Metoprolol Tartrate 50 Mg Tabs (Metoprolol tartrate) ..... One tab by mouth  two times a day    Amlodipine Besylate 5 Mg Tabs (Amlodipine besylate) ..... One by mouth daily  Orders: EKG w/ Interpretation (93000)  Patient Instructions: 1)  We will send a note of clearance to Dr. Isabel Caprice. 2)  Please call us with your surgery date as soon as you find out. We will need to bridge you with lovenox prior to surgery. 3)  Your physician wants you to follow-up in: 1 year with Dr. Shirlee Latch.  You will receive a reminder letter in the mail two months in advance. If you don't receive a letter, please call our office to schedule the follow-up appointment. 4)  Your physician recommends that you continue on your current medications as directed. Please refer to the Current Medication list given to you today.

## 2010-06-03 NOTE — Letter (Signed)
[facility]: [facility]   Imported By: Marylou Mccoy 05/21/2010 16:00:49  _____________________________________________________________________  External Attachment:    Type:   Image     Comment:   External Document

## 2010-06-09 ENCOUNTER — Encounter: Payer: Self-pay | Admitting: Internal Medicine

## 2010-06-09 ENCOUNTER — Encounter (INDEPENDENT_AMBULATORY_CARE_PROVIDER_SITE_OTHER): Payer: Medicare Other

## 2010-06-09 DIAGNOSIS — I359 Nonrheumatic aortic valve disorder, unspecified: Secondary | ICD-10-CM

## 2010-06-09 DIAGNOSIS — Z7901 Long term (current) use of anticoagulants: Secondary | ICD-10-CM

## 2010-06-17 NOTE — Medication Information (Signed)
Summary: Coumadin Clinic  Anticoagulant Therapy  Managed by: Weston Brass, PharmD Referring MD: Charlies Constable MD PCP: Doralee Albino MD Supervising MD: Daleen Squibb MD, Maisie Fus Indication 1: Aortic Valve Replacement (ICD-V43.3) Indication 2: St. Jude Valve Type (ICD-SJV) Lab Used: [facility] Site: [facility] INR POC 2.9 INR RANGE 2.5 - 3.5  Dietary changes: no    Health status changes: no    Bleeding/hemorrhagic complications: no    Recent/future hospitalizations: no    Any changes in medication regimen? no    Recent/future dental: no  Any missed doses?: no       Is patient compliant with meds? yes       Allergies: No Known Drug Allergies  Anticoagulation Management History:      The patient is taking warfarin and comes in today for a routine follow up visit.  Positive risk factors for bleeding include an age of 67 years or older.  The bleeding index is 'intermediate risk'.  Positive CHADS2 values include History of HTN and Age > 16 years old.  The start date was 09/04/1997.  Anticoagulation responsible provider: Daleen Squibb MD, Maisie Fus.  INR POC: 2.9.  Cuvette Lot#: [id number].  Exp: 05/2011.    Anticoagulation Management Assessment/Plan:      The patient's current anticoagulation dose is Coumadin 5 mg tabs: Use as directed by anticoagulation clinic.  The target INR is 2.5 - 3.5.  The next INR is due 06/30/2010.  Anticoagulation instructions were given to patient.  Results were reviewed/authorized by Weston Brass, PharmD.  He was notified by Margot Chimes PharmD Candidate.         Prior Anticoagulation Instructions: INR 2.1  Coumadin 5 mg tablets - Continue 1 tablet every day except 1/2 tablet on Fridays   Current Anticoagulation Instructions: INR 2.9   Continue your current Coumadin dose of 1 tablet everyday except 1/2 tablet on Fridays.  Recheck INR in 3 weeks.

## 2010-06-17 NOTE — Consult Note (Signed)
[facility]   Imported By: De Nurse 05/28/2010 15:36:25  _____________________________________________________________________  External Attachment:    Type:   Image     Comment:   External Document

## 2010-06-28 DIAGNOSIS — Z952 Presence of prosthetic heart valve: Secondary | ICD-10-CM

## 2010-06-28 DIAGNOSIS — Z7901 Long term (current) use of anticoagulants: Secondary | ICD-10-CM

## 2010-06-28 DIAGNOSIS — I359 Nonrheumatic aortic valve disorder, unspecified: Secondary | ICD-10-CM | POA: Insufficient documentation

## 2010-06-28 DIAGNOSIS — I350 Nonrheumatic aortic (valve) stenosis: Secondary | ICD-10-CM

## 2010-06-30 ENCOUNTER — Encounter (INDEPENDENT_AMBULATORY_CARE_PROVIDER_SITE_OTHER): Payer: Medicare Other

## 2010-06-30 ENCOUNTER — Encounter: Payer: Self-pay | Admitting: Cardiovascular Disease

## 2010-06-30 DIAGNOSIS — Z7901 Long term (current) use of anticoagulants: Secondary | ICD-10-CM

## 2010-06-30 DIAGNOSIS — I359 Nonrheumatic aortic valve disorder, unspecified: Secondary | ICD-10-CM

## 2010-07-08 NOTE — Medication Information (Signed)
Summary: rov/cb  Anticoagulant Therapy  Managed by: Bethena Midget, RN, BSN Referring MD: Charlies Constable MD PCP: Doralee Albino MD Supervising MD: Excell Seltzer MD, Casimiro Needle Indication 1: Aortic Valve Replacement (ICD-V43.3) Indication 2: St. Jude Valve Type (ICD-SJV) Lab Used: [facility] Site: [facility] INR POC 1.8 INR RANGE 2.5 - 3.5  Dietary changes: no    Health status changes: no    Bleeding/hemorrhagic complications: no    Recent/future hospitalizations: no    Any changes in medication regimen? no    Recent/future dental: no  Any missed doses?: yes     Details: Possible miss dose last week.   Is patient compliant with meds? yes       Allergies: No Known Drug Allergies  Anticoagulation Management History:      The patient is taking warfarin and comes in today for a routine follow up visit.  Positive risk factors for bleeding include an age of 75 years or older.  The bleeding index is 'intermediate risk'.  Positive CHADS2 values include History of HTN and Age > 74 years old.  The start date was 09/04/1997.  Anticoagulation responsible provider: Excell Seltzer MD, Casimiro Needle.  INR POC: 1.8.  Cuvette Lot#: [id number].  Exp: 05/2011.    Anticoagulation Management Assessment/Plan:      The patient's current anticoagulation dose is Coumadin 5 mg tabs: Use as directed by anticoagulation clinic.  The target INR is 2.5 - 3.5.  The next INR is due 07/16/2010.  Anticoagulation instructions were given to patient.  Results were reviewed/authorized by Bethena Midget, RN, BSN.  He was notified by Bethena Midget, RN, BSN.         Prior Anticoagulation Instructions: INR 2.9   Continue your current Coumadin dose of 1 tablet everyday except 1/2 tablet on Fridays.  Recheck INR in 3 weeks.    Current Anticoagulation Instructions: INR 1.8 Today take extra 1/2 pill, on Tomorrow take 1.5 pills then resume 1 pill everyday except 1/2 pill on Fridays. Recheck in 2-3 weeks.

## 2010-07-12 LAB — COMPREHENSIVE METABOLIC PANEL
AST: 22 U/L (ref 0–37)
CO2: 27 mEq/L (ref 19–32)
Calcium: 9.7 mg/dL (ref 8.4–10.5)
Creatinine, Ser: 1.33 mg/dL (ref 0.4–1.5)
GFR calc Af Amer: >60 mL/min (ref >60)
GFR calc non Af Amer: 52 mL/min — ABNORMAL LOW (ref >60)
Glucose, Bld: 93 mg/dL (ref 70–99)
Total Protein: 7.8 g/dL (ref 6.0–8.3)

## 2010-07-12 LAB — URINALYSIS, ROUTINE W REFLEX MICROSCOPIC
Bilirubin Urine: NEGATIVE
Glucose, UA: NEGATIVE mg/dL
Ketones, ur: NEGATIVE mg/dL
Nitrite: NEGATIVE
Protein, ur: NEGATIVE mg/dL
Specific Gravity, Urine: 1.021 (ref 1.005–1.030)
Urobilinogen, UA: 0.2 mg/dL (ref 0.0–1.0)
pH: 5 (ref 5.0–8.0)

## 2010-07-12 LAB — DIFFERENTIAL
Lymphocytes Relative: 18 % (ref 12–46)
Lymphs Abs: 2.1 10*3/uL (ref 0.7–4.0)
Monocytes Relative: 9 % (ref 3–12)
Neutrophils Relative %: 70 % (ref 43–77)

## 2010-07-12 LAB — CBC
Hemoglobin: 13.9 g/dL (ref 13.0–17.0)
MCH: 31.1 pg (ref 26.0–34.0)
MCHC: 32.9 g/dL (ref 30.0–36.0)
RDW: 13.9 % (ref 11.5–15.5)

## 2010-07-12 LAB — URINE MICROSCOPIC-ADD ON

## 2010-07-12 LAB — PROTIME-INR
INR: 2.63 — ABNORMAL HIGH (ref 0.00–1.49)
Prothrombin Time: 28.2 seconds — ABNORMAL HIGH (ref 11.6–15.2)

## 2010-07-12 LAB — SURGICAL PCR SCREEN: Staphylococcus aureus: NEGATIVE

## 2010-07-12 LAB — APTT: aPTT: 57 seconds — ABNORMAL HIGH (ref 24–37)

## 2010-07-13 ENCOUNTER — Encounter: Payer: Self-pay | Admitting: Home Health Services

## 2010-07-14 LAB — DIFFERENTIAL
Basophils Absolute: 0 10*3/uL (ref 0.0–0.1)
Lymphocytes Relative: 13 % (ref 12–46)
Lymphs Abs: 1.1 10*3/uL (ref 0.7–4.0)
Monocytes Absolute: 0.4 10*3/uL (ref 0.1–1.0)
Neutro Abs: 7.4 10*3/uL (ref 1.7–7.7)

## 2010-07-14 LAB — CBC
HCT: 38.2 % — ABNORMAL LOW (ref 39.0–52.0)
Hemoglobin: 12.9 g/dL — ABNORMAL LOW (ref 13.0–17.0)
RBC: 3.89 MIL/uL — ABNORMAL LOW (ref 4.22–5.81)
RDW: 15.2 % (ref 11.5–15.5)
WBC: 9.1 10*3/uL (ref 4.0–10.5)

## 2010-07-14 LAB — POCT I-STAT, CHEM 8
Chloride: 108 mEq/L (ref 96–112)
HCT: 47 % (ref 39.0–52.0)
Hemoglobin: 16 g/dL (ref 13.0–17.0)
Potassium: 4.3 mEq/L (ref 3.5–5.1)
Sodium: 141 mEq/L (ref 135–145)

## 2010-07-14 LAB — BASIC METABOLIC PANEL
BUN: 14 mg/dL (ref 6–23)
GFR calc Af Amer: >60 mL/min (ref >60)
GFR calc non Af Amer: >60 mL/min (ref >60)
Potassium: 4.1 mEq/L (ref 3.5–5.1)
Sodium: 138 mEq/L (ref 135–145)

## 2010-07-16 ENCOUNTER — Encounter (INDEPENDENT_AMBULATORY_CARE_PROVIDER_SITE_OTHER): Payer: Medicare Other

## 2010-07-16 ENCOUNTER — Encounter: Payer: Self-pay | Admitting: Cardiology

## 2010-07-16 DIAGNOSIS — Z7901 Long term (current) use of anticoagulants: Secondary | ICD-10-CM

## 2010-07-16 DIAGNOSIS — Z954 Presence of other heart-valve replacement: Secondary | ICD-10-CM

## 2010-07-16 DIAGNOSIS — I359 Nonrheumatic aortic valve disorder, unspecified: Secondary | ICD-10-CM

## 2010-07-20 NOTE — Medication Information (Signed)
Summary: rov/tm  Anticoagulant Therapy  Managed by: Bethena Midget, RN, BSN Referring MD: Charlies Constable MD PCP: Doralee Albino MD Supervising MD: Daleen Squibb MD, Maisie Fus Indication 1: Aortic Valve Replacement (ICD-V43.3) Indication 2: St. Jude Valve Type (ICD-SJV) Lab Used: [facility] Site: [facility] INR POC 2.8 INR RANGE 2.5 - 3.5  Dietary changes: no    Health status changes: no    Bleeding/hemorrhagic complications: no    Recent/future hospitalizations: no    Any changes in medication regimen? no    Recent/future dental: no  Any missed doses?: no       Is patient compliant with meds? yes       Allergies: No Known Drug Allergies  Anticoagulation Management History:      The patient is taking warfarin and comes in today for a routine follow up visit.  Positive risk factors for bleeding include an age of 75 years or older.  The bleeding index is 'intermediate risk'.  Positive CHADS2 values include History of HTN and Age > 75 years old.  The start date was 09/04/1997.  Anticoagulation responsible provider: Daleen Squibb MD, Maisie Fus.  INR POC: 2.8.  Cuvette Lot#: [id number].  Exp: 07/2011.    Anticoagulation Management Assessment/Plan:      The patient's current anticoagulation dose is Coumadin 5 mg tabs: Use as directed by anticoagulation clinic.  The target INR is 2.5 - 3.5.  The next INR is due 08/13/2010.  Anticoagulation instructions were given to patient.  Results were reviewed/authorized by Bethena Midget, RN, BSN.  He was notified by Bethena Midget, RN, BSN.         Prior Anticoagulation Instructions: INR 1.8 Today take extra 1/2 pill, on Tomorrow take 1.5 pills then resume 1 pill everyday except 1/2 pill on Fridays. Recheck in 2-3 weeks.   Current Anticoagulation Instructions: INR 2.8 Continue 1 pill everyday except 1/2 pill on Fridays. Recheck in 4 weeks.

## 2010-08-10 ENCOUNTER — Encounter: Payer: Self-pay | Admitting: Home Health Services

## 2010-08-10 ENCOUNTER — Ambulatory Visit (INDEPENDENT_AMBULATORY_CARE_PROVIDER_SITE_OTHER): Payer: Medicare Other | Admitting: Home Health Services

## 2010-08-10 VITALS — BP 104/70 | HR 55 | Temp 98.0°F | Ht 65.5 in | Wt 184.0 lb

## 2010-08-10 DIAGNOSIS — Z0000 Encounter for general adult medical examination without abnormal findings: Secondary | ICD-10-CM

## 2010-08-10 NOTE — Progress Notes (Signed)
Patient here for annual wellness visit, patient reports: Risk Factors/Conditions needing evaluation or treatment: Patient is concerned about constant dizziness and has a fear of falling. Home Safety: Patient lives with wife.  Patient reports having smoke detectors and does not have adaptive equipment in the bathrooms.  Other Information: Corrective lens: Patient wears corrective lens for reading and reports seeing eye doctor as needed. Dentures: Patient has a partial and reports seeing dentist as needed. Memory: Patient denies memory loss.  Balance max value patientvalue  Sitting balance 1 1  Arise 2 1  Attempts to arise 2 2  Immediate standing balance 2 1  Standing balance 1 0  Nudge 2 1  Eyes closed 1 0  360 degree turn 1 1  Sitting down 2 1   Gait max value patient value  Initiation of gait 1 0  Step length-left 1 0  Step length-right 1 0  Step height-left 1 0  Step height-right 1 0  Step symmetry 1 1  Step continuity 1 1  Path 2 2  Trunk 2 1  Walking stance 1 0   Balance/Gait Score:12/26    Annual Wellness Visit Requirements Recorded Today In  Medical, family, social history Past Medical, Family, Social History Section  Current providers Care team  Current medications Medications  Wt, BP, Ht, BMI Vital signs  Hearing assessment (welcome visit) Declined  Tobacco, alcohol, illicit drug use History  ADL Nurse Assessment  Depression Screening Nurse Assessment  Cognitive impairment/Mini Mental Status Nurse Assessment/ Flowsheet  Fall Risk Nurse Assessment  Home Safety Progress Note  End of Life Planning (welcome visit) Social Documentation  Medicare preventative services Progress Note  Risk factors/conditions needing evaluation/treatment Progress Note  Personalized health advice Patient Instructions, goals, letter  Diet & Exercise Social Documentation  Emergency Contact Social Documentation  Seat Belts Social Documentation  Sun exposure/protection Social  Documentation    Medicare Prevention Plan: Recommended patient contact pharmacy for shingles vaccine.   Recommended Medicare Prevention Screenings Men over 45 Test For Frequency Date of Last- BOLD if needed  Colorectal Cancer 1-10 yrs Recommended if patient has symptoms  Prostate Cancer Never or yearly 7/09  Aortic Aneurysm Once if 65-75 with hx of smoking Pt is under care of cardiologist  Cholesterol 5 yrs 1/11  Diabetes yearly Non diabetic  HIV yearly declined  Influenza Shot yearly 11/11  Pneumonia Shot once 12/02  Zostavax Shot once recommended

## 2010-08-10 NOTE — Progress Notes (Signed)
  Subjective:    Patient ID: Juan Li, male    DOB: 1931/08/25, 77 y.o.   MRN: [id number]  HPI symptoms of dizziness and BP is quite low.  Will DC amlodipine. Reviewed and discussed with Arlys Marcin.  Agree with her documentation    Review of Systems     Objective:   Physical Exam        Assessment & Plan:

## 2010-08-10 NOTE — Patient Instructions (Addendum)
1. Eat vegetables 3-4 times a day. 2. Increase your physical activity around your home including bycycling and chair exercises. 3. Stop taking amlodipine (Norvasc) and monitor your blood pressure 2-3 times a week.  If blood pressure rises above 140/90 notify Dr. Leveda Anna.  4. Call pharmacy for shingles vaccine.  5. Start using riding lawnmower instead of push mower.

## 2010-08-13 ENCOUNTER — Ambulatory Visit (INDEPENDENT_AMBULATORY_CARE_PROVIDER_SITE_OTHER): Payer: Medicare Other | Admitting: *Deleted

## 2010-08-13 DIAGNOSIS — I359 Nonrheumatic aortic valve disorder, unspecified: Secondary | ICD-10-CM

## 2010-08-13 DIAGNOSIS — I350 Nonrheumatic aortic (valve) stenosis: Secondary | ICD-10-CM

## 2010-08-13 DIAGNOSIS — Z954 Presence of other heart-valve replacement: Secondary | ICD-10-CM

## 2010-08-13 DIAGNOSIS — Z952 Presence of prosthetic heart valve: Secondary | ICD-10-CM

## 2010-08-13 DIAGNOSIS — Z7901 Long term (current) use of anticoagulants: Secondary | ICD-10-CM

## 2010-09-01 ENCOUNTER — Other Ambulatory Visit: Payer: Self-pay | Admitting: Family Medicine

## 2010-09-01 MED ORDER — WARFARIN SODIUM 5 MG PO TABS: 5.0000 mg | ORAL_TABLET | ORAL | Status: DC

## 2010-09-01 MED ORDER — OMEPRAZOLE 20 MG PO CPDR: 20.0000 mg | DELAYED_RELEASE_CAPSULE | Freq: Every day | ORAL | Status: DC

## 2010-09-01 NOTE — Telephone Encounter (Signed)
Refilled via fax request. 

## 2010-09-03 ENCOUNTER — Ambulatory Visit (INDEPENDENT_AMBULATORY_CARE_PROVIDER_SITE_OTHER): Payer: Medicare Other | Admitting: *Deleted

## 2010-09-03 DIAGNOSIS — Z7901 Long term (current) use of anticoagulants: Secondary | ICD-10-CM

## 2010-09-03 DIAGNOSIS — I359 Nonrheumatic aortic valve disorder, unspecified: Secondary | ICD-10-CM

## 2010-09-03 DIAGNOSIS — I350 Nonrheumatic aortic (valve) stenosis: Secondary | ICD-10-CM

## 2010-09-03 DIAGNOSIS — Z952 Presence of prosthetic heart valve: Secondary | ICD-10-CM

## 2010-09-03 LAB — POCT INR: INR: 2.5

## 2010-09-14 NOTE — Assessment & Plan Note (Signed)
[facility]                            CARDIOLOGY OFFICE NOTE   Juan Li, Juan Li                      MRN:          [id number]  DATE:05/10/2007                            DOB:          1931/07/15    PRIMARY CARE PHYSICIAN:  Juan Li, M.D.   CLINICAL HISTORY:  This person is 77 years old and returns for follow-up  management of his valvular heart disease.  He had St. Jude aortic valve  replacement for aortic stenosis in 1993 and was found to have  nonobstructive coronary disease at that time.  He also has hypertension.   He says he has been doing quite well from the standpoint of his heart.  He has had no chest pain, shortness breath or palpitations.  His biggest  problem is related to gait disturbance.  He seen Dr. Jeral Li regarding  this and he has seen a neurologist at [facility] and had a CT scan  of MR of his neck and head,  but apparently no definite diagnosis has  been found.  He said they had some question whether he might have  Parkinson disease.   PAST MEDICAL HISTORY:  1. Hypertension.  2. Hyperlipidemia.  3. Gout.   CURRENT MEDICATIONS:  Coumadin, diclofenac enalapril, Avodart,  colchicine, Vytorin, aspirin, hydrochlorothiazide, and doxycycline.  I  am not sure of the reason for the doxycycline, but I think it may have  been started for prostatitis in the past.   PHYSICAL EXAMINATION:  VITAL SIGNS:  On examination, blood pressure  157/78 and pulse 80 and regular.  NECK:  There is no venous distention.  The carotid pulses were full  without bruits.  There was a scar in the left anterior neck from his  previous neck surgery.  CHEST:  Clear without rales or rhonchi.  CARDIAC:  Current rhythm was regular.  No murmurs or gallops.  There is  a normal prosthetic closure sound of the St. Jude aortic prosthesis.  ABDOMEN:  Soft with normal bowel sounds.  There is no  hepatosplenomegaly.  EXTREMITIES:  Peripheral pulses  are full.  Trace peripheral edema.   Electrocardiogram showed left axis deviation; was otherwise normal.   IMPRESSION:  1. Status post St. Jude aortic valve replacement for aortic stenosis      in 1993, stable.  2. Nonobstructive coronary artery disease with catheterization in      1993.  3. Hypertension not optimal control.  4. Maximal hyperlipidemia.  5. Her status post cervical spine surgery.  6. Lumbar spine disease.  7. Ataxia of uncertain etiology, status post workup at [facility].   RECOMMENDATIONS:  It seems Juan Li is doing well from a cardiac  standpoint.  His blood pressure is somewhat elevated.  Will plan to have  him come in for lab tests tomorrow.  Repeat blood pressure at that time  and decide if we need another medication.  He is on maximal doses of  enalapril and he is on hydrochlorothiazide.  Probably would add  amlodipine.  He was on Toprol in the past  but this was discontinued and  I have to check on the reasons for this.  Will stop doxycycline.  I will  plan to see him back in a year.  Will not make any changes unless we  need to adjust for his blood pressure lapse.   ADDENDUM:  He was on Toprol-XL at the time of his visit a year ago, but  this was discontinued some time in the interim and the reasons are not  clear.     Juan Elvera Lennox Juanda Chance, MD, [facility]  Electronically Signed    BRB/MedQ  DD: 05/10/2007  DT: 05/10/2007  Job #: [id number]   cc:   Juan Li, M.D.

## 2010-09-14 NOTE — Assessment & Plan Note (Signed)
[facility]                            CARDIOLOGY OFFICE NOTE   CLAUDELL, WOHLER                      MRN:          [id number]  DATE:01/15/2008                            DOB:          1931/08/05    REFERRING PHYSICIAN:  Lennie Muckle, MD   PRIMARY CARE PHYSICIAN:  Santiago Bumpers. Leveda Anna, MD with [facility].   CLINICAL HISTORY:  Juan Li is 77 years old and came today for  preoperative evaluation prior to removal of a probable basal cell cancer  under general anesthesia.   He has a St. Jude aortic valve replacement for aortic stenosis in 1993  and had nonobstructive coronary disease at that time.  He has done quite  well from a cardiac standpoint.  He has had no recent chest pain or  palpitations.  He does say that he gives out more easily than he used to  over the past 1-2 years.   PAST MEDICAL HISTORY:  Significant for hypertension, hyperlipidemia, and  gout.  He has also had a gait disturbance and has seen Dr. Jeral Fruit and he  has also seen a neurologist at [facility], and has had CTs and MRs  of his neck and head without a definitive diagnosis.   He was recently found to have a probable basal cell cancer in scalp and  there are plans to remove it with a probable skin graft under general  anesthesia by Dr. Freida Busman.   SOCIAL HISTORY:  The patient is married and has three children, but one  of his children passed away.  He is a previous smoker.  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   FAMILY HISTORY:  The patient's father died at 43 of probable ruptured  abdominal aortic aneurysm.  His mother died at age 44 from a stroke.   REVIEW OF SYSTEMS:  Positive for symptoms related to arthritis and  decreased exercise tolerance.   CURRENT MEDICATIONS:  1. Coumadin.  2. Enalapril 20 mg b.i.d.  3. Avodart 1 capsule daily.  4. Colchicine 0.6 daily.  5. Folic acid.  6. Vytorin 10/40 daily.  7. Hydrochlorothiazide 25 mg daily.  8.  Flomax.   PHYSICAL EXAMINATION:  VITALS:  Blood pressure is 160/70, pulse 61 and  regular.  NECK:  There was no venous distension.  The carotid pulses were full and  there were bilateral transmitted bruits.  CHEST:  Clear without rales or rhonchi.  HEART:  Cardiac rhythm was regular.  There were no murmurs or gallops.  ABDOMEN:  Soft with normal bowel sounds.  There was no  hepatosplenomegaly.  EXTREMITIES:  Peripheral pulses were full with no peripheral edema.  MUSCULOSKELETAL:  No deformities.  SKIN:  Warm and dry.  NEUROLOGIC:  No focal neurological signs.   An electrocardiogram today showed sinus rhythm with left axis deviation  and minimal voltage for LVH.   IMPRESSION:  1. Preoperative evaluation prior to resection of a probable basal cell      under general anesthesia.  2. Valvular heart disease status post St. Jude aortic valve  replacement for aortic stenosis in 1993.  3. Nonobstructive coronary disease at catheterization 1993.  4. Hypertension under optimal control.  5. Hyperlipidemia.  6. Status post cervical spine surgery.  7. Lumbar spine disease.   RECOMMENDATIONS:  There are several issues regarding Mr. Seeling  surgery.  Since this will be done under general anesthesia, I think we  need to evaluate him further for his cardiac risk and we have arranged  for him to have an adenosine rest stress Myoview scan.  He has a high-  risk profile for vascular disease and some of his exertional symptoms  could be related to an ischemic equivalent.  He also is on Coumadin for  his prosthetic St. Jude aortic valve.  I think with this valve, we can  stop Coumadin 4-5 days before the surgery without overlap and resume it  shortly after surgery.  With his prosthetic valve, I think he will need  subacute bacterial endocarditis prophylaxis.  We will check regarding  the appropriate antibiotics for surgery of the skin and make those  recommendations.     Bruce Elvera Lennox  Juanda Chance, MD, [facility]  Electronically Signed    BRB/MedQ  DD: 01/15/2008  DT: 01/16/2008  Job #: [id number]   cc:   Lennie Muckle, MD  Santiago Bumpers. Leveda Anna, M.D.

## 2010-09-17 NOTE — Assessment & Plan Note (Signed)
[facility]                            CARDIOLOGY OFFICE NOTE   BREN, STEERS                      MRN:          [id number]  DATE:05/31/2006                            DOB:          1931-07-23    PRIMARY CARE PHYSICIAN:  Santiago Bumpers. Leveda Anna, M.D.   CLINICAL HISTORY:  Mr. Merrick is now 77 years old and had a St. Jude  aortic valve replacement for aortic stenosis in 1993.  He was found to  have nonobstructive coronary artery disease at catheterization at that  time.  He also has hypertension.  He has done quite well with his heart  and has had no recent chest pain, shortness of breath or palpitations.   PAST MEDICAL HISTORY:  1. Hypertension.  2. Hyperlipidemia.  3. Gout.   Recently he has had difficulty with pain in his right groin and hip  area, mostly with walking.  He is told this is due to arthritis, but  does not have a clear definition of the problem.  When I saw him last  year, he was having trouble with his gait and we referred him to  neurology.  He was seen by Dr. Sunny Schlein. Sethi, but I do not have a  final report, and he said there was no specific treatment recommended.   CURRENT MEDICATIONS:  1. Coumadin.  2. Diclofenac.  3. Enalapril.  4. Toprol.  5. Avodart.  6. Colchicine.  7. Folic acid.  8. Vytorin.  9. Aspirin.  10.Famotidine.  11.Hydrochlorothiazide.  12.Doxycycline.   PHYSICAL EXAMINATION:  VITAL SIGNS:  Blood pressure 142/78, pulse 60 and  regular.  NECK:  No jugular venous distention.  Carotid pulses were full without  bruit s.  CHEST:  Clear without rales or rhonchi.  HEART:  Rhythm was regular.  There is a normal prosthetic closure sound.  I could hear no systolic or diastolic murmurs.  ABDOMEN:  Soft, normal bowel sounds.  There is no hepatosplenomegaly.  EXTREMITIES:  I checked the right femoral area and the right femoral  pulse was full and there were no bruits.  There is no peripheral edema.  The  pedal pulses are equal.   An electrocardiogram showed left axis deviation and was otherwise  normal.   IMPRESSION:  1. Status post St. Jude aortic valve replacement for aortic stenosis      in 1993, stable.  2. Nonobstructive coronary artery disease at catheterization in 1993.  3. Hypertension.  4. Hyperlipidemia.  5. Status post cervical spine surgery.  6. Lumbar spine disease.  7. Right groin pain, of uncertain etiology.   RECOMMENDATIONS:  I think that Mr. Canoy is doing well from the  standpoint of his heart.  I checked his right femoral pulse and  peripheral pulses, to see if his right groin pain might possibly be  related to circulation, but I do not think so.  He has not had an  echocardiogram since 1993, so we will plan to do an echocardiogram to re-  evaluate his prosthetic valve and LV function.   I will plan to see him back  in followup in one year.  Dr.  Leveda Anna is  managing his lipids and blood pressure.     Bruce Elvera Lennox Juanda Chance, MD, [facility]  Electronically Signed    BRB/MedQ  DD: 05/31/2006  DT: 05/31/2006  Job #: [id number]

## 2010-09-17 NOTE — H&P (Signed)
NAME:  Juan Li, PICHON                         ACCOUNT NO.:  [id number]   MEDICAL RECORD NO.:  [id number]                   PATIENT TYPE:  INP   LOCATION:  2892                                 FACILITY:  [facility]   PHYSICIAN:  Hilda Lias, M.D.                DATE OF BIRTH:  06-13-1931   DATE OF ADMISSION:  09/24/2003  DATE OF DISCHARGE:                                HISTORY & PHYSICAL   HISTORY OF PRESENT ILLNESS:  The patient is a gentleman who was seen in my  office a few days ago because of difficulty walking.  He had been  complaining of numbness all over his body and difficulty walking in a  straight line.  The patient had open heart surgery with a valve implant and  since then he has been on Coumadin.  He feels that the weakness is getting  worse.  He had MRI of the brain, MRI of the cervical spine, and he was sent  to [place] for evaluation.  He had carpal tunnel surgery done by Katy Fitch.  Sypher, M.D. a few months ago.   PAST MEDICAL HISTORY:  Valve replacement in the heart and carpal tunnel  surgery.   ALLERGIES:  No known drug allergies.   SOCIAL HISTORY:  Negative.   FAMILY HISTORY:  Unremarkable.   REVIEW OF SYSTEMS:  High blood pressure, increased urination, and neck pain.   PHYSICAL EXAMINATION:  GENERAL:  The patient presented to my office and he  was walking with a sure step. He has a tendency to hold himself against the  wall to prevent falling.  HEENT:  Normal.  NECK:  He is able to flex.  Extension __________.  LUNGS:  Clear.  HEART:  Heart sounds normal. There is a midline scar from previous surgery.  EXTREMITIES:  Grade I edema in the lower extremities.  NEUROLOGY:  Mental status normal.  Cranial nerves II-XII grossly intact.  Strength; he has weakness of the deltoid, biceps, and wrist extensor.  He  has increased tone, in the upper and lower muscles.  Reflexes; hyperreflexia  basically on the right side.   MRI showed that this gentleman has cervical  stenosis at the level of 3-4  with a foraminal stenosis at the level of 4-5 and 5-1.   IMPRESSION:  1. Cervical myelopathy with a large herniated disk and stenosis at the level     of 3-4.  2. Cervical spondylosis at multiple levels.   RECOMMENDATIONS:  The patient is being admitted for one-level anterior  cervical diskectomy.  The risks were explained to him and his wife which  include paralysis, no improvement whatsoever, need for further surgery,  hematoma because of the history of Coumadin, damage to the vocal cords,  damage to the esophagus, stroke, and all the risks associated with his  heart, especially being off Coumadin.  After surgery, the patient will be  going to the  intensive care unit.                                                Hilda Lias, M.D.    EB/MEDQ  D:  09/24/2003  T:  09/25/2003  Job:  [id number]

## 2010-09-17 NOTE — Discharge Summary (Signed)
NAME:  Juan Li, Juan Li                         ACCOUNT NO.:  [id number]   MEDICAL RECORD NO.:  [id number]                   PATIENT TYPE:  INP   LOCATION:  3039                                 FACILITY:  [facility]   PHYSICIAN:  Coletta Memos, M.D.                  DATE OF BIRTH:  1931-12-08   DATE OF ADMISSION:  09/24/2003  DATE OF DISCHARGE:  09/27/2003                                 DISCHARGE SUMMARY   ADMITTING DIAGNOSIS:  Cervical myelopathy secondary to cervical stenosis at  C3-4, C4-5.   PROCEDURE:  Anterior cervical diskectomy, C3-4, C4-5 for cervical  radiculopathy.   COMPLICATIONS:  None.   SURGEON:  Hilda Lias, M.D.   DISCHARGE DESTINATION:  Home.   The patient's incision is clean and dry without signs infection.  He has  good strength in the upper extremities and myelopathic gait, but he is  walking while here.  He will be discharged home and he was given medication  for pain.  He takes Coumadin per protocol and was given instructions by Dr.  Jeral Fruit when he should restart his Coumadin.  He will call the office and  make a followup appointment with Dr. Jeral Fruit.                                                Coletta Memos, M.D.    KC/MEDQ  D:  09/27/2003  T:  09/28/2003  Job:  [id number]

## 2010-09-17 NOTE — Op Note (Signed)
NAME:  ROSHAWN, AYALA                         ACCOUNT NO.:  [id number]   MEDICAL RECORD NO.:  [id number]                   PATIENT TYPE:  AMB   LOCATION:  DSC                                  FACILITY:  [facility]   PHYSICIAN:  Katy Fitch. Naaman Plummer., M.D.          DATE OF BIRTH:  [birth year]   DATE OF PROCEDURE:  01/30/2003  DATE OF DISCHARGE:                                 OPERATIVE REPORT   PREOPERATIVE DIAGNOSIS:  Entrapment neuropathy median nerve, left carpal  tunnel, with background history of valvular heart disease and chronic  Coumadin anticoagulation.   POSTOPERATIVE DIAGNOSIS:  Entrapment neuropathy median nerve, left carpal  tunnel, with background history of valvular heart disease and chronic  Coumadin anticoagulation.   OPERATION:  Release of left transverse carpal ligament.   SURGEON:  Katy Fitch. Sypher, M.D.   ASSISTANT:  None.   ANESTHESIA:  General by LMA.   ANESTHESIOLOGIST:  Dr. Jean Rosenthal   INDICATIONS FOR PROCEDURE:  Ersel Enslin is a 77 year old gentleman who was  referred for evaluation and management of a painful and numb left hand.  He  has a history of valvular heart disease status post replacement and chronic  Coumadin anticoagulation.  Clinical examination revealed signs of  significant entrapment neuropathy on the left at the level of the transverse  carpal ligament.  Electrodiagnostic studies confirm significant carpal  tunnel syndrome.  Due to a failure to respond to nonoperative measures, he  is brought to the operating room at this time for release of his left  transverse carpal ligament.  His Coumadin was discontinued six days prior to  surgery.  A prothrombin time obtained on January 28, 2003, revealed an INR  of 1.9.  His other laboratory studies were acceptable.  He was seen by Dr.  Juanda Chance and his colleagues for preoperative myocardial perfusion imaging  study and on January 22, 2003, was noted to have a low risk scan for  proceeding with  surgery.  He was released by the cardiologist to proceed  with either general or regional anesthesia at this time.  A follow up PT  today revealed that his INR had corrected.   PROCEDURE:  Romie Tay is brought to the operating room and placed on  supine position on the operating table.  Following induction of general  anesthesia, the left arm was prepped with Betadine solution and sterilely  draped.  A pneumatic tourniquet was applied to the proximal brachium.  Following exsanguination of the left arm with an Esmarch bandage, an  arterial tourniquet is inflated to 230 mmHg.  The procedure commenced with a  short incision in the line of the ring finger in the palm.  The subcutaneous  tissues were carefully divided revealing the palmar fascia.  This was split  longitudinally to the common sensory branches of the median nerve.  These  were followed back to the transverse carpal ligament which was carefully  isolated from  the median nerve.  The ligament was released along its ulnar  border extending into the distal forearm.  This widely opened the carpal  canal.  Bleeding  points along the margins of the released ligament were electrocauterized  with bipolar current followed by repair of the skin with intradermal 3-0  Prolene suture.  A compressive dressing was applied with a volar plaster  splint with the wrist in 5 degrees dorsiflexion.                                               Katy Fitch Naaman Plummer., M.D.    RVS/MEDQ  D:  01/30/2003  T:  01/30/2003  Job:  [id number]   cc:   Charlies Constable, M.D.   William A. [name], M.D.  [address] [place]  [place] [address]  Fax: [phone]

## 2010-09-17 NOTE — Op Note (Signed)
NAME:  Juan Li, Juan Li                         ACCOUNT NO.:  [id number]   MEDICAL RECORD NO.:  [id number]                   PATIENT TYPE:  INP   LOCATION:  3316                                 FACILITY:  [facility]   PHYSICIAN:  Hilda Lias, M.D.                DATE OF BIRTH:  1932-04-03   DATE OF PROCEDURE:  09/24/2003  DATE OF DISCHARGE:                                 OPERATIVE REPORT   PREOPERATIVE DIAGNOSIS:  C3-C4 and C4-C5 stenosis with cervical  radiculopathy, gliosis within the spinal cord, quadriparesis.   POSTOPERATIVE DIAGNOSIS:  C3-C4 and C4-C5 stenosis with cervical  radiculopathy, gliosis within the spinal cord, quadriparesis.   PROCEDURE:  Decompression at the level of C3-C4 and C4-C5, allograft and  plate from C3 to C%, microscope.   SURGEON:  Hilda Lias, M.D.   ASSISTANT:  Cristi Loron, M.D.   CLINICAL HISTORY:  The patient was admitted because of weakness and  difficult with balance.  X-ray showed that he has stenosis at the level of  C3-C4 and C4-C5.  Unfortunately, the patient was scheduled for one level  procedure but, according to him, wife, and my record, he needed two level.  The surgery was fully explained to him and his wife prior to surgery.   PROCEDURE:  The patient was taken to the OR and after intubation, which was  done in the neutral position, the neck was prepped with Betadine.  A  longitudinal incision was made down to the cervical spine.  X-rays showed  that we were at the level of 4-5.  The anterior ligament was opened at the  level of 4-5 and 5-6.  We brought the microscope into the area.  The patient  had quite a bit of degenerative disc disease with some calcifications of the  posterior ligament.  The incision was made and decompression of the spinal  cord at 3-4 and 4-5 was done.  Foraminotomy was achieved.  We could see the  spinal cord, especially at the level of 3-4, was pushed backward.  At the  end, the endplate was drilled  and allograft 7 mm in height was inserted.  This was followed by a plate using five screws.  Lateral C-spine showed good  position of the bone graft and the plate.  A drain was placed in the  precervical area.  The patient had been on Coumadin for a long time because  of heart disease.  Having good hemostasis, the area was irrigated and closed  with Vicryl and Steri-Strips.                                               Hilda Lias, M.D.    EB/MEDQ  D:  09/24/2003  T:  09/24/2003  Job:  [id number]

## 2010-09-20 ENCOUNTER — Other Ambulatory Visit: Payer: Self-pay | Admitting: Family Medicine

## 2010-09-20 NOTE — Telephone Encounter (Signed)
Refill request

## 2010-09-21 ENCOUNTER — Other Ambulatory Visit: Payer: Self-pay | Admitting: Family Medicine

## 2010-09-21 MED ORDER — TAMSULOSIN HCL 0.4 MG PO CAPS: 0.4000 mg | ORAL_CAPSULE | Freq: Every day | ORAL | Status: DC

## 2010-09-24 ENCOUNTER — Ambulatory Visit (INDEPENDENT_AMBULATORY_CARE_PROVIDER_SITE_OTHER): Payer: Medicare Other | Admitting: *Deleted

## 2010-09-24 DIAGNOSIS — I359 Nonrheumatic aortic valve disorder, unspecified: Secondary | ICD-10-CM

## 2010-09-24 DIAGNOSIS — Z952 Presence of prosthetic heart valve: Secondary | ICD-10-CM

## 2010-09-24 DIAGNOSIS — Z7901 Long term (current) use of anticoagulants: Secondary | ICD-10-CM

## 2010-09-24 DIAGNOSIS — I350 Nonrheumatic aortic (valve) stenosis: Secondary | ICD-10-CM

## 2010-09-24 LAB — POCT INR: INR: 2.9

## 2010-10-15 ENCOUNTER — Other Ambulatory Visit: Payer: Self-pay | Admitting: Family Medicine

## 2010-10-15 DIAGNOSIS — M545 Low back pain: Secondary | ICD-10-CM

## 2010-10-15 MED ORDER — CYCLOBENZAPRINE HCL 5 MG PO TABS: 5.0000 mg | ORAL_TABLET | Freq: Every day | ORAL | Status: DC

## 2010-10-15 MED ORDER — DUTASTERIDE 0.5 MG PO CAPS: 0.5000 mg | ORAL_CAPSULE | Freq: Every evening | ORAL | Status: DC

## 2010-10-15 NOTE — Assessment & Plan Note (Signed)
Refilled via fax request. 

## 2010-10-15 NOTE — Telephone Encounter (Signed)
Refilled via mail order fax request.

## 2010-10-22 ENCOUNTER — Other Ambulatory Visit: Payer: Self-pay | Admitting: Family Medicine

## 2010-10-22 ENCOUNTER — Encounter: Payer: Medicare Other | Admitting: *Deleted

## 2010-10-22 NOTE — Telephone Encounter (Signed)
Pt requesting rx for hydrocodone, MD is on vacation so pt is asking if another attending can write it?

## 2010-10-25 ENCOUNTER — Ambulatory Visit (INDEPENDENT_AMBULATORY_CARE_PROVIDER_SITE_OTHER): Payer: Medicare Other | Admitting: *Deleted

## 2010-10-25 DIAGNOSIS — Z952 Presence of prosthetic heart valve: Secondary | ICD-10-CM

## 2010-10-25 DIAGNOSIS — Z7901 Long term (current) use of anticoagulants: Secondary | ICD-10-CM

## 2010-10-25 DIAGNOSIS — I350 Nonrheumatic aortic (valve) stenosis: Secondary | ICD-10-CM

## 2010-10-25 DIAGNOSIS — I359 Nonrheumatic aortic valve disorder, unspecified: Secondary | ICD-10-CM

## 2010-10-25 MED ORDER — HYDROCODONE-ACETAMINOPHEN 10-500 MG PO TABS: 1.0000 | ORAL_TABLET | Freq: Four times a day (QID) | ORAL | Status: DC

## 2010-10-25 NOTE — Telephone Encounter (Signed)
Called, wants written Rx for 3 month supply because fills by mail order.  Rx left at front desk.

## 2010-11-19 ENCOUNTER — Ambulatory Visit (INDEPENDENT_AMBULATORY_CARE_PROVIDER_SITE_OTHER): Payer: Medicare Other | Admitting: *Deleted

## 2010-11-19 DIAGNOSIS — I350 Nonrheumatic aortic (valve) stenosis: Secondary | ICD-10-CM

## 2010-11-19 DIAGNOSIS — Z7901 Long term (current) use of anticoagulants: Secondary | ICD-10-CM

## 2010-11-19 DIAGNOSIS — I359 Nonrheumatic aortic valve disorder, unspecified: Secondary | ICD-10-CM

## 2010-11-19 DIAGNOSIS — Z952 Presence of prosthetic heart valve: Secondary | ICD-10-CM

## 2010-11-26 ENCOUNTER — Encounter: Payer: Medicare Other | Admitting: *Deleted

## 2010-12-02 ENCOUNTER — Other Ambulatory Visit: Payer: Self-pay | Admitting: Family Medicine

## 2010-12-02 DIAGNOSIS — I10 Essential (primary) hypertension: Secondary | ICD-10-CM

## 2010-12-02 DIAGNOSIS — F068 Other specified mental disorders due to known physiological condition: Secondary | ICD-10-CM

## 2010-12-02 MED ORDER — METOPROLOL TARTRATE 50 MG PO TABS: 50.0000 mg | ORAL_TABLET | Freq: Two times a day (BID) | ORAL | Status: DC

## 2010-12-02 MED ORDER — MEMANTINE HCL 10 MG PO TABS: 10.0000 mg | ORAL_TABLET | Freq: Two times a day (BID) | ORAL | Status: DC

## 2010-12-02 NOTE — Assessment & Plan Note (Signed)
Refilled via fax request. 

## 2010-12-17 ENCOUNTER — Ambulatory Visit (INDEPENDENT_AMBULATORY_CARE_PROVIDER_SITE_OTHER): Payer: Medicare Other | Admitting: *Deleted

## 2010-12-17 DIAGNOSIS — I350 Nonrheumatic aortic (valve) stenosis: Secondary | ICD-10-CM

## 2010-12-17 DIAGNOSIS — Z952 Presence of prosthetic heart valve: Secondary | ICD-10-CM

## 2010-12-17 DIAGNOSIS — Z7901 Long term (current) use of anticoagulants: Secondary | ICD-10-CM

## 2010-12-17 DIAGNOSIS — I359 Nonrheumatic aortic valve disorder, unspecified: Secondary | ICD-10-CM

## 2010-12-17 LAB — POCT INR: INR: 2.6

## 2010-12-20 ENCOUNTER — Other Ambulatory Visit: Payer: Self-pay | Admitting: Family Medicine

## 2010-12-20 NOTE — Telephone Encounter (Signed)
Refill request

## 2010-12-29 ENCOUNTER — Other Ambulatory Visit: Payer: Self-pay | Admitting: Family Medicine

## 2010-12-29 DIAGNOSIS — I10 Essential (primary) hypertension: Secondary | ICD-10-CM

## 2011-01-05 ENCOUNTER — Ambulatory Visit (INDEPENDENT_AMBULATORY_CARE_PROVIDER_SITE_OTHER): Payer: Medicare Other | Admitting: Family Medicine

## 2011-01-05 ENCOUNTER — Encounter: Payer: Self-pay | Admitting: Family Medicine

## 2011-01-05 VITALS — BP 174/68 | HR 55 | Temp 99.1°F | Ht 65.5 in | Wt 195.4 lb

## 2011-01-05 DIAGNOSIS — I10 Essential (primary) hypertension: Secondary | ICD-10-CM

## 2011-01-05 DIAGNOSIS — M159 Polyosteoarthritis, unspecified: Secondary | ICD-10-CM

## 2011-01-05 MED ORDER — HYDROCHLOROTHIAZIDE 12.5 MG PO TABS: 12.5000 mg | ORAL_TABLET | Freq: Every day | ORAL | Status: DC

## 2011-01-05 MED ORDER — ZOSTER VACCINE LIVE 19400 UNT/0.65ML ~~LOC~~ SOLR: 0.6500 mL | Freq: Once | SUBCUTANEOUS | Status: DC

## 2011-01-06 NOTE — Progress Notes (Signed)
  Subjective:    Patient ID: Juan Li, male    DOB: 1931-06-15, 77 y.o.   MRN: [id number]  HPI Patient with Rt knee pain.  Chronic issue and know DJD worsened by the abnormal gait which is a residual of his cervical myelopathy.  Unfortunately, due to Lt arm weakness, he is only able to use his cane in his Rt. Hand.  Does have central obesity and has had some wt loss over last few years which has helped.  Unfortunately, in the last 6 months, he has regained wt. BP noted to be up    Review of Systems     Objective:   Physical Exam Rt. Knee, some crepitus.  Ligaments intact.  After informed consent, injected knee with 2cc xylocaine and 2cc kenalog (40mg /cc) with good initial relief        Assessment & Plan:

## 2011-01-06 NOTE — Assessment & Plan Note (Signed)
First time I have done a knee injection on him.  He knows to watch for how long and how much improvement so as to gauge whether repeat injections are an option.

## 2011-01-06 NOTE — Assessment & Plan Note (Signed)
Not at goal.  Encouraged wt loss and added HCTZ

## 2011-01-14 ENCOUNTER — Encounter: Payer: Self-pay | Admitting: Family Medicine

## 2011-01-14 ENCOUNTER — Ambulatory Visit (INDEPENDENT_AMBULATORY_CARE_PROVIDER_SITE_OTHER): Payer: Medicare Other | Admitting: *Deleted

## 2011-01-14 DIAGNOSIS — Z952 Presence of prosthetic heart valve: Secondary | ICD-10-CM

## 2011-01-14 DIAGNOSIS — I350 Nonrheumatic aortic (valve) stenosis: Secondary | ICD-10-CM

## 2011-01-14 DIAGNOSIS — I359 Nonrheumatic aortic valve disorder, unspecified: Secondary | ICD-10-CM

## 2011-01-14 DIAGNOSIS — Z7901 Long term (current) use of anticoagulants: Secondary | ICD-10-CM

## 2011-01-14 NOTE — Progress Notes (Signed)
  Subjective:    Patient ID: Juan Li, male    DOB: 09-Oct-1931, 77 y.o.   MRN: [id number]  HPI Received uro consult from 01/13/11.  Stable from uro standpoint.  Clear urine and infection.  Does has some lower abd pain which I may need to investigate if persists.   Review of Systems     Objective:   Physical Exam        Assessment & Plan:

## 2011-02-11 ENCOUNTER — Ambulatory Visit (INDEPENDENT_AMBULATORY_CARE_PROVIDER_SITE_OTHER): Payer: Medicare Other | Admitting: *Deleted

## 2011-02-11 DIAGNOSIS — I359 Nonrheumatic aortic valve disorder, unspecified: Secondary | ICD-10-CM

## 2011-02-11 DIAGNOSIS — I350 Nonrheumatic aortic (valve) stenosis: Secondary | ICD-10-CM

## 2011-02-11 DIAGNOSIS — Z7901 Long term (current) use of anticoagulants: Secondary | ICD-10-CM

## 2011-02-11 DIAGNOSIS — Z23 Encounter for immunization: Secondary | ICD-10-CM

## 2011-02-11 DIAGNOSIS — Z952 Presence of prosthetic heart valve: Secondary | ICD-10-CM

## 2011-03-07 ENCOUNTER — Other Ambulatory Visit: Payer: Self-pay | Admitting: Family Medicine

## 2011-03-07 NOTE — Telephone Encounter (Signed)
Refill request

## 2011-03-11 ENCOUNTER — Ambulatory Visit (INDEPENDENT_AMBULATORY_CARE_PROVIDER_SITE_OTHER): Payer: Medicare Other | Admitting: *Deleted

## 2011-03-11 DIAGNOSIS — I359 Nonrheumatic aortic valve disorder, unspecified: Secondary | ICD-10-CM

## 2011-03-11 DIAGNOSIS — Z7901 Long term (current) use of anticoagulants: Secondary | ICD-10-CM

## 2011-03-21 ENCOUNTER — Other Ambulatory Visit: Payer: Self-pay | Admitting: Family Medicine

## 2011-03-21 MED ORDER — HYDROCODONE-ACETAMINOPHEN 10-500 MG PO TABS: 1.0000 | ORAL_TABLET | Freq: Four times a day (QID) | ORAL | Status: DC

## 2011-03-21 NOTE — Telephone Encounter (Signed)
Refilled with [facility] via fax request

## 2011-03-30 ENCOUNTER — Other Ambulatory Visit: Payer: Self-pay | Admitting: Family Medicine

## 2011-03-30 MED ORDER — HYDROCODONE-ACETAMINOPHEN 10-500 MG PO TABS: 1.0000 | ORAL_TABLET | Freq: Four times a day (QID) | ORAL | Status: DC | PRN

## 2011-03-30 NOTE — Telephone Encounter (Signed)
Pt wants to know if MD would be willing to write an rx for a few days supply of hydrocodone? Says he normally gets it by mail but it hasn't arrived yet and he is about out.

## 2011-03-30 NOTE — Telephone Encounter (Signed)
done

## 2011-04-04 ENCOUNTER — Ambulatory Visit: Payer: Medicare Other | Admitting: Cardiology

## 2011-04-20 ENCOUNTER — Encounter: Payer: Self-pay | Admitting: Cardiology

## 2011-04-20 ENCOUNTER — Ambulatory Visit (INDEPENDENT_AMBULATORY_CARE_PROVIDER_SITE_OTHER): Payer: Medicare Other | Admitting: Cardiology

## 2011-04-20 ENCOUNTER — Ambulatory Visit (INDEPENDENT_AMBULATORY_CARE_PROVIDER_SITE_OTHER): Payer: Medicare Other | Admitting: *Deleted

## 2011-04-20 VITALS — BP 136/68 | HR 52 | Ht 66.0 in | Wt 195.1 lb

## 2011-04-20 DIAGNOSIS — Z7901 Long term (current) use of anticoagulants: Secondary | ICD-10-CM

## 2011-04-20 DIAGNOSIS — I10 Essential (primary) hypertension: Secondary | ICD-10-CM

## 2011-04-20 DIAGNOSIS — I359 Nonrheumatic aortic valve disorder, unspecified: Secondary | ICD-10-CM

## 2011-04-20 DIAGNOSIS — E7800 Pure hypercholesterolemia, unspecified: Secondary | ICD-10-CM

## 2011-04-20 DIAGNOSIS — I350 Nonrheumatic aortic (valve) stenosis: Secondary | ICD-10-CM

## 2011-04-20 DIAGNOSIS — Z952 Presence of prosthetic heart valve: Secondary | ICD-10-CM

## 2011-04-20 LAB — HEPATIC FUNCTION PANEL
Alkaline Phosphatase: 58 U/L (ref 39–117)
Total Protein: 7.5 g/dL (ref 6.0–8.3)

## 2011-04-20 LAB — POCT INR: INR: 3.3

## 2011-04-20 LAB — BASIC METABOLIC PANEL
BUN: 24 mg/dL — ABNORMAL HIGH (ref 6–23)
Calcium: 9 mg/dL (ref 8.4–10.5)
GFR: 60.02 mL/min (ref >60.00)
Glucose, Bld: 93 mg/dL (ref 70–99)
Sodium: 140 mEq/L (ref 135–145)

## 2011-04-20 LAB — LIPID PANEL
Cholesterol: 251 mg/dL — ABNORMAL HIGH (ref 0–200)
HDL: 36.3 mg/dL — ABNORMAL LOW (ref >39.00)
Total CHOL/HDL Ratio: 7
VLDL: 46.6 mg/dL — ABNORMAL HIGH (ref 0.0–40.0)

## 2011-04-20 NOTE — Patient Instructions (Signed)
Take aspirin 81mg  daily.  Your physician recommends that you return for a FASTING lipid profile /liver profile/BMET 401.1  424.1  Your physician wants you to follow-up in: 1 year with Dr Shirlee Latch. Edsel Petrin 2013). You will receive a reminder letter in the mail two months in advance. If you don't receive a letter, please call our office to schedule the follow-up appointment.

## 2011-04-20 NOTE — Assessment & Plan Note (Signed)
No lipids recently.  He stopped Vytorin due to myalgias.  I will check his lipids today (essentially fasting) along with a BMET.

## 2011-04-20 NOTE — Assessment & Plan Note (Signed)
BP seems to be under reasonable control.

## 2011-04-20 NOTE — Assessment & Plan Note (Signed)
Mechanical aortic valve, well-seated on last echo.  No significant exertional dyspnea or chest pain.  No bleeding problems on coumadin.  According to most recent guidelines, he should be on ASA 81 mg daily in addition to warfarin with his mechanical valve.  I will have him start this.

## 2011-04-20 NOTE — Progress Notes (Signed)
PCP: Dr. Leveda Anna  77 yo with history of AS s/p St Jude AVR presents for cardiology followup.  He has been seen by Dr. Juanda Chance in the past and is seen by me for the first time today.  His valve was replaced in 1993.  Echo in 9/11 showed normal EF and a well-seated mechanical valve.  Cardiac cath in 1993 showed nonobstructive disease.  He has been started on Namendo and Aricept for some degree of dementia.  His wife says he has gotten lost when out driving locally before.  From a cardiac standpoint, he has been doing well.  He has a number of orthopedic limitations.  He walks with a cane because of knee and back pain.  He denies exertional dyspnea though he is not very active.  No chest pain.  No problems with bleeding on coumadin.    ECG: NSR at 52, inferior TW inversions  Labs (1/12): K 4.8, creatinine 1.3  PMH: 1. Dementia: On Aricept and Namenda.  Wife has not told him about this diagnosis.   2. Hyperlipidemia: myalgias with Vytorin 3. BPH 4. Osteoarthritis 5. Cervical myelopathy  6. Obesity 7. HTN 8. Aortic stenosis: St. Jude mechanical aortic valve placed in 1993. Echo (9/11) with EF 55-60%, mild LV hypertrophy, normally functioning mechanical aortic valve, moderate diastolic dysfunction, normal RV.   9. Cardiac cath in 1993 with nonobstructive disease.  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  11. Abdominal ultrasound in 12/11 did not show AAA 12. Gout 13. 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  Married.  Does not smoke.   FH: No premature CAD.   ROS: All systems reviewed and negative except as per HPI.   Current Outpatient Prescriptions  Medication Sig Dispense Refill  . amLODipine (NORVASC) 5 MG tablet Take 5 mg by mouth daily.        . cyclobenzaprine (FLEXERIL) 5 MG tablet Take 1 tablet (5 mg total) by mouth at bedtime.  90 tablet  3  . donepezil (ARICEPT) 5 MG tablet ONE BY MOUTH DAILY  90 tablet  0  . dutasteride (AVODART) 0.5 MG capsule Take 1 capsule (0.5 mg  total) by mouth every evening.  90 capsule  3  . enalapril (VASOTEC) 20 MG tablet TAKE 1 TABLET TWICE A DAY  180 tablet  3  . HYDROcodone-acetaminophen (LORTAB) 10-500 MG per tablet Take 1 tablet by mouth 4 (four) times daily.  360 tablet  1  . memantine (NAMENDA) 10 MG tablet Take 1 tablet (10 mg total) by mouth 2 (two) times daily.  180 tablet  3  . metoprolol (LOPRESSOR) 50 MG tablet Take 1 tablet (50 mg total) by mouth 2 (two) times daily.  180 tablet  3  . omeprazole (PRILOSEC) 20 MG capsule Take 1 capsule (20 mg total) by mouth daily.  90 capsule  3  . Tamsulosin HCl (FLOMAX) 0.4 MG CAPS Take 1 capsule (0.4 mg total) by mouth daily.  90 capsule  3  . warfarin (COUMADIN) 5 MG tablet Take 1 tablet (5 mg total) by mouth as directed. by anticoag clinic  100 tablet  3  . aspirin EC 81 MG tablet Take 1 tablet (81 mg total) by mouth daily.      Marland Kitchen HYDROcodone-acetaminophen (LORTAB 10) 10-500 MG per tablet Take 1 tablet by mouth every 6 (six) hours as needed for pain.  30 tablet  0  . DISCONTD: enalapril (VASOTEC) 5 MG tablet Take 5 mg by mouth 2 (two) times daily.  BP 136/68  Pulse 52  Ht 5\' 6"  (1.676 m)  Wt 88.506 kg (195 lb 1.9 oz)  BMI 31.49 kg/m2  SpO2 95% General: NAD, obese Neck: No JVD, no thyromegaly or thyroid nodule.  Lungs: Clear to auscultation bilaterally with normal respiratory effort. CV: Nondisplaced PMI.  Heart regular S1/S2, mechanical S2, no S3/S4, 2/6 SEM RUSB.  Trace ankle edema.  No carotid bruit.  Normal pedal pulses.  Abdomen: Soft, nontender, no hepatosplenomegaly, no distention.  Neurologic: Alert and oriented x 3.  Psych: Normal affect. Extremities: No clubbing or cyanosis.

## 2011-04-21 LAB — LDL CHOLESTEROL, DIRECT: Direct LDL: 176.5 mg/dL

## 2011-04-22 ENCOUNTER — Encounter: Payer: Medicare Other | Admitting: *Deleted

## 2011-04-25 ENCOUNTER — Telehealth: Payer: Self-pay | Admitting: *Deleted

## 2011-04-25 MED ORDER — PRAVASTATIN SODIUM 40 MG PO TABS: 40.0000 mg | ORAL_TABLET | Freq: Every evening | ORAL | Status: DC

## 2011-04-25 NOTE — Telephone Encounter (Signed)
Juan, Li - 04/20/11 More Detail >>      Juan Ancona, MD        Sent: Juan Li April 24, 2011 11:18 PM    To: Juan Krauss, RN          Result Note     LDL is very high.  He had myalgias with Vytorin.  Would suggest trying pravastatin 40 with coenzyme Q10 200 mg daily.     Attached to     LIPID PANEL (Order# [id number]); HEPATIC FUNCTION PANEL (Order# [id number]); BASIC METABOLIC PANEL (Order# [id number]); LDL CHOLESTEROL, DIRECT (Order# [id number])         Lipid panel      Status: Final result   MyChart: Not Released   Next appt with me: None   Dx: Essential hypertension, benign        Notes Recorded by Juan Krauss, RN on 04/25/2011 at 1:02 PM He agreed to start pravastatin 40 and coenzyme Q10 200mg  daily. Notes Recorded by Juan Krauss, RN on 04/25/2011 at 1:00 PM Discussed with pt. He is aware of Dr Juan Li recommendations. Notes Recorded by Juan Ancona, MD on 04/24/2011 at 11:18 PM LDL is very high.  He had myalgias with Vytorin.  Would suggest trying pravastatin 40 with coenzyme Q10 200 mg daily. Notes Recorded by Juan Krauss, RN on 04/21/2011 at 11:17 AM Preliminarily reviewed by Triage. Awaiting MD review and signature.         Range    5d ago    29yr ago    66yr ago    16yr ago    28yr ago        Cholesterol 0 - 200 mg/dL 846 (H)    962X, CM   528 (H)R, CM   110R, CM   163R, CM      Comments:        ATP III Classification       Desirable:  < 200 mg/dL               Borderline High:  200 - 239 mg/dL          High:  > = 413 mg/dL         Triglycerides 0.0 - 149.0 mg/dL 244.0 (H)    102 (H)R   325 (H)R   150 (H)R, CM   253 (H)R      Comments:        Normal:  <150 mg/dLBorderline High:  150 - 199 mg/dL         HDL >72.53 mg/dL 66.44 (L)    39 (L)R   35 (L)R   25.4 (L)R   34 (L)R       VLDL 0.0 - 40.0 mg/dL 03.4 (H)    74Q   65 (H)R   30R   51 (H)R       Total CHOL/HDL Ratio  7    3.6 Ratio   7.6 Ratio   4.3 CALC   4.8 Ratio      Comments:                       Men          Women1/2 Average Risk     3.4          3.3Average Risk          5.0          4.42X Average Risk  9.6          7.13X Average Risk          15.0          11.0                            Resulting Agency                Lab Flowsheet    Order Details View Encounter Lab and Collection Details Routing Result History      Specimen Collected: 04/20/11  3:01 PM Last Resulted: 04/20/11  5:46 PM        CM=Additional comments  R=Reference range differs from displayed range          Hepatic function panel      Status: Final result   MyChart: Not Released   Next appt with me: None   Dx: Aortic valve disorders; Essential hyp...        Notes Recorded by Juan Krauss, RN on 04/25/2011 at 1:02 PM He agreed to start pravastatin 40 and coenzyme Q10 200mg  daily. Notes Recorded by Juan Krauss, RN on 04/25/2011 at 1:00 PM Discussed with pt. He is aware of Dr Juan Li recommendations. Notes Recorded by Juan Ancona, MD on 04/24/2011 at 11:18 PM LDL is very high.  He had myalgias with Vytorin.  Would suggest trying pravastatin 40 with coenzyme Q10 200 mg daily. Notes Recorded by Juan Krauss, RN on 04/21/2011 at 11:17 AM Preliminarily reviewed by Triage. Awaiting MD review and signature.         Range    5d ago    7mo ago    69yr ago    15yr ago    20yr ago    32yr ago        Total Bilirubin 0.3 - 1.2 mg/dL 1.0    0.8   4.0N   0.2V   0.9R   0.8R       Bilirubin, Direct 0.0 - 0.3 mg/dL 0.1             2.5D          Alkaline Phosphatase 39 - 117 U/L 58    60   53R   49R   46R   53R       AST 0 - 37 U/L 25    22   21R   23R   25R   24R       ALT 0 - 53 U/L 17    19   15R   16R   21R   25R       Total Protein 6.0 - 8.3 g/dL 7.5    7.8   6.6Y   4.0H   7.1R   7.4R       Albumin 3.5 - 5.2 g/dL 4.1    3.5   4.7Q   2.5Z   3.8R   4.5R      Resulting Agency                  Lab Flowsheet    Order Details View Encounter Lab and Collection Details Routing  Result History      Specimen Collected: 04/20/11  3:01 PM Last Resulted: 04/20/11  5:46 PM        R=Reference range differs from displayed range          Basic metabolic panel  Status: Final result   MyChart: Not Released   Next appt with me: None   Dx: Aortic valve disorders; Essential hyp...        Notes Recorded by Juan Krauss, RN on 04/25/2011 at 1:02 PM He agreed to start pravastatin 40 and coenzyme Q10 200mg  daily. Notes Recorded by Juan Krauss, RN on 04/25/2011 at 1:00 PM Discussed with pt. He is aware of Dr Juan Li recommendations. Notes Recorded by Juan Ancona, MD on 04/24/2011 at 11:18 PM LDL is very high.  He had myalgias with Vytorin.  Would suggest trying pravastatin 40 with coenzyme Q10 200 mg daily. Notes Recorded by Juan Krauss, RN on 04/21/2011 at 11:17 AM Preliminarily reviewed by Triage. Awaiting MD review and signature.         Range    5d ago (04/20/11)    22mo ago (05/04/10)    85yr ago (02/02/10)    105yr ago (02/01/10)    33yr ago (05/27/09)    23yr ago (01/01/09)    39yr ago (08/27/08)        Sodium 135 - 145 mEq/L 140    138   138   141   138R   138R   141R       Potassium 3.5 - 5.1 mEq/L 4.5    4.8   4.1   4.3   4.8R   5.2 (H)R   5.3R       Chloride 96 - 112 mEq/L 108    104   109   108   103R   106R   109R       CO2 19 - 32 mEq/L 24    27   24       24R   28R   23R       Glucose, Bld 70 - 99 mg/dL 93    93   161 (H)   99   92R   81R   93R       BUN 6 - 23 mg/dL 24 (H)    20   14   24  (H)   24 (H)R   23R   24 (H)R       Creatinine, Ser 0.4 - 1.5 mg/dL 1.2    0.96   0.45   1.3   1.24R   1.2R   1.42R       Calcium 8.4 - 10.5 mg/dL 9.0    9.7   8.6      4.0J   9.0R   9.4R       GFR >60.00 mL/min 60.02                         Resulting Agency                    Lab Flowsheet    Order Details View Encounter Lab and Collection Details Routing Result History      Specimen Collected: 04/20/11  3:01 PM Last Resulted: 04/20/11  5:47 PM         R=Reference range differs from displayed range          LDL cholesterol, direct      Status: Final result   MyChart: Not Released   Next appt with me: None        Notes Recorded by Juan Krauss, RN on 04/25/2011 at 1:02 PM He agreed to start pravastatin 40 and coenzyme Q10 200mg   daily. Notes Recorded by Juan Krauss, RN on 04/25/2011 at 1:00 PM Discussed with pt. He is aware of Dr Juan Li recommendations. Notes Recorded by Juan Ancona, MD on 04/24/2011 at 11:18 PM LDL is very high.  He had myalgias with Vytorin.  Would suggest trying pravastatin 40 with coenzyme Q10 200 mg daily. Notes Recorded by Juan Krauss, RN on 04/21/2011 at 11:17 AM Preliminarily reviewed by Triage. Awaiting MD review and signature.         Value Range      Direct LDL 176.5 mg/dL    Comments:    Optimal:  <100 mg/dLNear or Above Optimal:  100-129 mg/dLBorderline High:  130-159 mg/dLHigh:  160-189 mg/dLVery High:  >190 mg/dL    Resulting Agency  [facility]      Lab Flowsheet    Order Details View Encounter Lab and Collection Details Routing Result History      Specimen Collected: 04/20/11  3:01 PM Last Resulted: 04/21/11 11:16 AM               Status of Other Orders           Lab Status Result Date Provider Status      EKG 12-Lead  Final result 04/20/2011 Open

## 2011-06-01 ENCOUNTER — Ambulatory Visit (INDEPENDENT_AMBULATORY_CARE_PROVIDER_SITE_OTHER): Payer: Medicare Other | Admitting: *Deleted

## 2011-06-01 DIAGNOSIS — I359 Nonrheumatic aortic valve disorder, unspecified: Secondary | ICD-10-CM

## 2011-06-01 DIAGNOSIS — Z954 Presence of other heart-valve replacement: Secondary | ICD-10-CM

## 2011-06-01 DIAGNOSIS — Z7901 Long term (current) use of anticoagulants: Secondary | ICD-10-CM

## 2011-06-01 DIAGNOSIS — I350 Nonrheumatic aortic (valve) stenosis: Secondary | ICD-10-CM

## 2011-06-01 DIAGNOSIS — Z952 Presence of prosthetic heart valve: Secondary | ICD-10-CM

## 2011-06-09 ENCOUNTER — Other Ambulatory Visit: Payer: Self-pay | Admitting: Family Medicine

## 2011-06-09 NOTE — Telephone Encounter (Signed)
Refill request

## 2011-06-15 ENCOUNTER — Other Ambulatory Visit: Payer: Self-pay | Admitting: *Deleted

## 2011-06-15 MED ORDER — PRAVASTATIN SODIUM 40 MG PO TABS: 40.0000 mg | ORAL_TABLET | Freq: Every evening | ORAL | Status: DC

## 2011-07-04 ENCOUNTER — Ambulatory Visit (INDEPENDENT_AMBULATORY_CARE_PROVIDER_SITE_OTHER): Payer: Medicare Other | Admitting: Family Medicine

## 2011-07-04 ENCOUNTER — Encounter: Payer: Self-pay | Admitting: Family Medicine

## 2011-07-04 VITALS — BP 152/60 | HR 60 | Temp 98.2°F | Ht 65.5 in | Wt 198.2 lb

## 2011-07-04 DIAGNOSIS — J069 Acute upper respiratory infection, unspecified: Secondary | ICD-10-CM

## 2011-07-04 DIAGNOSIS — I10 Essential (primary) hypertension: Secondary | ICD-10-CM | POA: Diagnosis not present

## 2011-07-04 NOTE — Progress Notes (Signed)
  Subjective:    Patient ID: Juan Li, male    DOB: [birth year], 79 y.o.   MRN: [id number]  HPI Discussed and examined patient with MS3, agree with documentation  Work in appt for 5 days of sore throat with rhinorrhea, cough.  No dyspnea, fever, emesis, diarrhea, chest pain, abdominal pain.  Has been taking OTC cold medications without relief.    HTN:  Unsure if he took his BP meds this morning, but likely did as he states his wife fills his pill boxes.  No chest pain, dyspnea.   Review of Systemssee HPI     Objective:   Physical Exam GEN: Alert & Oriented, No acute distress HEENT: [place]/AT. EOMI, PERRLA, no conjunctival injection or scleral icterus.  Bilateral tympanic membranes intact without erythema or effusion.  .  Nares without edema or rhinorrhea.  Oropharynx is with minimal erythema, no edema or exudates.  Small vesicle noted on left posterior orpharync. No anterior or posterior cervical lymphadenopathy. CV:  Regular Rate & Rhythm, no murmur Respiratory:  Normal work of breathing, CTAB         Assessment & Plan:

## 2011-07-04 NOTE — Progress Notes (Signed)
  Subjective:    Patient ID: Juan Li, male    DOB: [birth year], 78 y.o.   MRN: [id number]  HPI Juan Li is a 78 yo M with PMHx of htn and GERD to presents to PCP with complaints of sore throat for one week.  Pt reports that he has had irritated throat and ear congestion since the middle of last week.  Denies fever, chills, n/v/d, rash, cough, rhinorrhea, myalgias, hemoptysis, headache.  Denies SOB, wheezing, CP.  Pt reports that throat spray and throat lozenges do not help reduce throat pain.  Pt reports that he had a flu shot earlier this season.   Review of Systems  All other systems reviewed and are negative.       Objective:   Physical Exam  Constitutional: He appears well-developed. No distress.  HENT:  Right Ear: External ear and ear canal normal.  Left Ear: External ear and ear canal normal.  Nose: No mucosal edema or rhinorrhea.  Mouth/Throat: Mucous membranes are normal. No oropharyngeal exudate or posterior oropharyngeal edema.       Small vesicle on left posterior oropharynx  Eyes: Conjunctivae and EOM are normal. Pupils are equal, round, and reactive to light.  Neck: Normal range of motion. Neck supple.  Cardiovascular: Normal rate, regular rhythm and normal heart sounds.  Exam reveals no gallop and no friction rub.   No murmur heard. Pulmonary/Chest: Effort normal and breath sounds normal. He has no wheezes. He has no rales. He exhibits no tenderness.  Lymphadenopathy:    He has no cervical adenopathy.  Skin: Skin is warm and dry.   .Blood pressure 152/60, pulse 60, temperature 98.2 F (36.8 C), temperature source Oral, height 5' 5.5" (1.664 m), weight 198 lb 3.2 oz (89.903 kg).      Assessment & Plan:

## 2011-07-04 NOTE — Assessment & Plan Note (Addendum)
Elevated blood pressure, significantly lower on recheck., still above goal.  On metoprolol, lisinopril, and ??? HCTZ.  amlodipine discontinued due to low blood pressure and dizziness last year.  Advised follow-up with pcp once uri resolved for further monitoring of BP, advised not to take OTC decongestants, bring meds to all visits.

## 2011-07-04 NOTE — Patient Instructions (Signed)
  You have a viral respiratory infection. It should resolve on its own in about one week. You may take over the counter pain-relievers like Tylenol or Ibuprofen to help with throat pain. You may also gargle with saltwater, use throat lozenges, or throat spray. Seek medical attention if symptoms get worse. Please follow-up with your primary care provider in two weeks for blood pressure check.   Upper Respiratory Infection, Adult An upper respiratory infection (URI) is also known as the common cold. It is often caused by a type of germ (virus). Colds are easily spread (contagious). You can pass it to others by kissing, coughing, sneezing, or drinking out of the same glass. Usually, you get better in 1 or 2 weeks.  HOME CARE   Only take medicine as told by your doctor.   Use a warm mist humidifier or breathe in steam from a hot shower.   Drink enough water and fluids to keep your pee (urine) clear or pale yellow.   Get plenty of rest.   Return to work when your temperature is back to normal or as told by your doctor. You may use a face mask and wash your hands to stop your cold from spreading.  GET HELP RIGHT AWAY IF:   After the first few days, you feel you are getting worse.   You have questions about your medicine.   You have chills, shortness of breath, or brown or red spit (mucus).   You have yellow or brown snot (nasal discharge) or pain in the face, especially when you bend forward.   You have a fever, puffy (swollen) neck, pain when you swallow, or white spots in the back of your throat.   You have a bad headache, ear pain, sinus pain, or chest pain.   You have a high-pitched whistling sound when you breathe in and out (wheezing).   You have a lasting cough or cough up blood.   You have sore muscles or a stiff neck.  MAKE SURE YOU:   Understand these instructions.   Will watch your condition.   Will get help right away if you are not doing well or get worse.    Document Released: 10/05/2007 Document Revised: 04/07/2011 Document Reviewed: 08/23/2010 [facility] Patient Information 2012 [place], [place].

## 2011-07-04 NOTE — Assessment & Plan Note (Addendum)
Sore throat approx 5 days with no evidence of edema or bacterial infection.  In setting of viral uri- ? Coxsackievirus given one single small possible vesicle.  Advised supportive care, discussed red flags for return.

## 2011-07-12 DIAGNOSIS — M169 Osteoarthritis of hip, unspecified: Secondary | ICD-10-CM | POA: Diagnosis not present

## 2011-07-12 DIAGNOSIS — M1710 Unilateral primary osteoarthritis, unspecified knee: Secondary | ICD-10-CM | POA: Diagnosis not present

## 2011-07-13 ENCOUNTER — Ambulatory Visit (INDEPENDENT_AMBULATORY_CARE_PROVIDER_SITE_OTHER): Payer: Medicare Other | Admitting: Pharmacist

## 2011-07-13 DIAGNOSIS — Z954 Presence of other heart-valve replacement: Secondary | ICD-10-CM

## 2011-07-13 DIAGNOSIS — I350 Nonrheumatic aortic (valve) stenosis: Secondary | ICD-10-CM

## 2011-07-13 DIAGNOSIS — I359 Nonrheumatic aortic valve disorder, unspecified: Secondary | ICD-10-CM

## 2011-07-13 DIAGNOSIS — Z952 Presence of prosthetic heart valve: Secondary | ICD-10-CM

## 2011-07-13 DIAGNOSIS — Z7901 Long term (current) use of anticoagulants: Secondary | ICD-10-CM

## 2011-07-13 LAB — POCT INR: INR: 3.2

## 2011-07-15 ENCOUNTER — Telehealth: Payer: Self-pay | Admitting: Pharmacist

## 2011-07-15 NOTE — Telephone Encounter (Signed)
Message copied by Velda Shell on Fri Jul 15, 2011  4:50 PM ------      Message from: Laurey Morale      Created: Wed Jul 13, 2011 11:42 AM      Regarding: RE: Approval to come off coumadin       He does not have risk factors in addition to mechanical AVR, so by guidelines would not have to be bridged. Minimize time off coumadin.  Continue ASA 81      ----- Message -----         From: Mariane Masters, PHARMD         Sent: 07/13/2011  11:17 AM           To: Laurey Morale, MD      Subject: Approval to come off coumadin                            Dr. Shirlee Latch,            Mr. Bunte is scheduled to have steroid shots on Wed, March 20th with Dr. Homero Fellers [facility]) & was told that he would need to come off his coumadin.  Will he need to be bridged?

## 2011-07-15 NOTE — Telephone Encounter (Signed)
LMOM for him to hold Coumadin as directed by Dr. Daun Peacock.

## 2011-07-20 ENCOUNTER — Ambulatory Visit: Payer: Medicare Other | Admitting: Family Medicine

## 2011-07-20 DIAGNOSIS — M169 Osteoarthritis of hip, unspecified: Secondary | ICD-10-CM | POA: Diagnosis not present

## 2011-07-20 DIAGNOSIS — Z7901 Long term (current) use of anticoagulants: Secondary | ICD-10-CM | POA: Diagnosis not present

## 2011-07-27 ENCOUNTER — Ambulatory Visit (INDEPENDENT_AMBULATORY_CARE_PROVIDER_SITE_OTHER): Payer: Medicare Other | Admitting: Pharmacist

## 2011-07-27 DIAGNOSIS — Z952 Presence of prosthetic heart valve: Secondary | ICD-10-CM

## 2011-07-27 DIAGNOSIS — Z954 Presence of other heart-valve replacement: Secondary | ICD-10-CM | POA: Diagnosis not present

## 2011-07-27 DIAGNOSIS — I359 Nonrheumatic aortic valve disorder, unspecified: Secondary | ICD-10-CM

## 2011-07-27 DIAGNOSIS — Z7901 Long term (current) use of anticoagulants: Secondary | ICD-10-CM | POA: Diagnosis not present

## 2011-07-27 DIAGNOSIS — I350 Nonrheumatic aortic (valve) stenosis: Secondary | ICD-10-CM

## 2011-07-27 LAB — POCT INR: INR: 1.9

## 2011-08-09 ENCOUNTER — Encounter: Payer: Self-pay | Admitting: Home Health Services

## 2011-08-17 ENCOUNTER — Ambulatory Visit (INDEPENDENT_AMBULATORY_CARE_PROVIDER_SITE_OTHER): Payer: Medicare Other | Admitting: *Deleted

## 2011-08-17 DIAGNOSIS — Z7901 Long term (current) use of anticoagulants: Secondary | ICD-10-CM | POA: Diagnosis not present

## 2011-08-17 DIAGNOSIS — Z954 Presence of other heart-valve replacement: Secondary | ICD-10-CM

## 2011-08-17 DIAGNOSIS — I350 Nonrheumatic aortic (valve) stenosis: Secondary | ICD-10-CM

## 2011-08-17 DIAGNOSIS — I359 Nonrheumatic aortic valve disorder, unspecified: Secondary | ICD-10-CM

## 2011-08-17 DIAGNOSIS — Z952 Presence of prosthetic heart valve: Secondary | ICD-10-CM

## 2011-08-23 DIAGNOSIS — M1710 Unilateral primary osteoarthritis, unspecified knee: Secondary | ICD-10-CM | POA: Diagnosis not present

## 2011-08-26 ENCOUNTER — Other Ambulatory Visit: Payer: Self-pay | Admitting: Family Medicine

## 2011-08-29 ENCOUNTER — Other Ambulatory Visit: Payer: Self-pay | Admitting: Family Medicine

## 2011-08-29 DIAGNOSIS — M159 Polyosteoarthritis, unspecified: Secondary | ICD-10-CM

## 2011-08-29 MED ORDER — HYDROCODONE-ACETAMINOPHEN 10-500 MG PO TABS: 1.0000 | ORAL_TABLET | Freq: Four times a day (QID) | ORAL | Status: DC

## 2011-08-29 NOTE — Assessment & Plan Note (Signed)
Refilled pain med to [facility]

## 2011-09-02 ENCOUNTER — Ambulatory Visit (INDEPENDENT_AMBULATORY_CARE_PROVIDER_SITE_OTHER): Payer: Medicare Other | Admitting: Family Medicine

## 2011-09-02 ENCOUNTER — Encounter: Payer: Self-pay | Admitting: Family Medicine

## 2011-09-02 VITALS — BP 133/58 | HR 58 | Temp 97.5°F | Ht 65.5 in | Wt 199.6 lb

## 2011-09-02 DIAGNOSIS — R269 Unspecified abnormalities of gait and mobility: Secondary | ICD-10-CM | POA: Diagnosis not present

## 2011-09-02 DIAGNOSIS — E7800 Pure hypercholesterolemia, unspecified: Secondary | ICD-10-CM | POA: Diagnosis not present

## 2011-09-02 HISTORY — DX: Unspecified abnormalities of gait and mobility: R26.9

## 2011-09-02 NOTE — Progress Notes (Signed)
  Subjective:    Patient ID: Juan Li, male    DOB: [birth year], 78 y.o.   MRN: [id number]  HPI Patient feeling well at his baseline.  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    His visit was mainly to ask about an article he read about Enbrel injections (epidural?) to treat the residual effects of strokes.  He will be visiting his son in [place] near the physician who is doing this.  Reviewed the newspaper article and did a quick search.  This is an off label use of Enbrel which at the present stage must be called experimental.  Also, the treated group (no control group) all had strokes, not spinal cord injuries.  As such I could not recommend.         Review of Systems     Objective:   Physical Exam        Assessment & Plan:

## 2011-09-02 NOTE — Assessment & Plan Note (Signed)
From old c spine injury.  Could not recommend experimental use of enbrel.

## 2011-09-02 NOTE — Patient Instructions (Signed)
The injection in the paper is experimental for stroke.  You have not had a stroke, your damage was to your spinal cord.  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 Make sure you are taking all your meds.  Check the attached list with your prescriptions. I checked your cholesterol again.  Last time it was quite high.  Especially check that you are taking your pravastatin which lowers cholesterol. You have never received the shingles vaccine - zostavax.  Regular medicare does not pay but part D Medicare may pay.  I do recommend this vaccine but it costs about $250 if you have to pay yourself.  Please check into this and let me know if you want me to prescribe.

## 2011-09-02 NOTE — Assessment & Plan Note (Signed)
He thinks he is back on his meds.  Will check LDL and ask wife to verify med list.

## 2011-09-05 ENCOUNTER — Encounter: Payer: Self-pay | Admitting: Family Medicine

## 2011-09-14 ENCOUNTER — Ambulatory Visit (INDEPENDENT_AMBULATORY_CARE_PROVIDER_SITE_OTHER): Payer: Medicare Other | Admitting: *Deleted

## 2011-09-14 DIAGNOSIS — Z954 Presence of other heart-valve replacement: Secondary | ICD-10-CM

## 2011-09-14 DIAGNOSIS — Z7901 Long term (current) use of anticoagulants: Secondary | ICD-10-CM | POA: Diagnosis not present

## 2011-09-14 DIAGNOSIS — I359 Nonrheumatic aortic valve disorder, unspecified: Secondary | ICD-10-CM

## 2011-09-14 DIAGNOSIS — I350 Nonrheumatic aortic (valve) stenosis: Secondary | ICD-10-CM

## 2011-09-14 DIAGNOSIS — Z952 Presence of prosthetic heart valve: Secondary | ICD-10-CM

## 2011-09-14 LAB — POCT INR: INR: 2.6

## 2011-09-20 DIAGNOSIS — M1710 Unilateral primary osteoarthritis, unspecified knee: Secondary | ICD-10-CM | POA: Diagnosis not present

## 2011-09-21 ENCOUNTER — Other Ambulatory Visit: Payer: Self-pay | Admitting: Family Medicine

## 2011-09-27 DIAGNOSIS — M1710 Unilateral primary osteoarthritis, unspecified knee: Secondary | ICD-10-CM | POA: Diagnosis not present

## 2011-10-05 DIAGNOSIS — M1710 Unilateral primary osteoarthritis, unspecified knee: Secondary | ICD-10-CM | POA: Diagnosis not present

## 2011-10-14 ENCOUNTER — Ambulatory Visit (INDEPENDENT_AMBULATORY_CARE_PROVIDER_SITE_OTHER): Payer: Medicare Other | Admitting: *Deleted

## 2011-10-14 DIAGNOSIS — I359 Nonrheumatic aortic valve disorder, unspecified: Secondary | ICD-10-CM | POA: Diagnosis not present

## 2011-10-14 DIAGNOSIS — Z7901 Long term (current) use of anticoagulants: Secondary | ICD-10-CM | POA: Diagnosis not present

## 2011-10-14 DIAGNOSIS — I350 Nonrheumatic aortic (valve) stenosis: Secondary | ICD-10-CM

## 2011-10-14 DIAGNOSIS — Z954 Presence of other heart-valve replacement: Secondary | ICD-10-CM | POA: Diagnosis not present

## 2011-10-14 DIAGNOSIS — Z952 Presence of prosthetic heart valve: Secondary | ICD-10-CM

## 2011-11-11 ENCOUNTER — Ambulatory Visit (INDEPENDENT_AMBULATORY_CARE_PROVIDER_SITE_OTHER): Payer: Medicare Other | Admitting: Pharmacist

## 2011-11-11 DIAGNOSIS — Z952 Presence of prosthetic heart valve: Secondary | ICD-10-CM

## 2011-11-11 DIAGNOSIS — Z954 Presence of other heart-valve replacement: Secondary | ICD-10-CM | POA: Diagnosis not present

## 2011-11-11 DIAGNOSIS — I350 Nonrheumatic aortic (valve) stenosis: Secondary | ICD-10-CM

## 2011-11-11 DIAGNOSIS — I359 Nonrheumatic aortic valve disorder, unspecified: Secondary | ICD-10-CM

## 2011-11-11 DIAGNOSIS — Z7901 Long term (current) use of anticoagulants: Secondary | ICD-10-CM | POA: Diagnosis not present

## 2011-11-17 DIAGNOSIS — M1710 Unilateral primary osteoarthritis, unspecified knee: Secondary | ICD-10-CM | POA: Diagnosis not present

## 2011-11-24 ENCOUNTER — Other Ambulatory Visit: Payer: Self-pay | Admitting: Cardiology

## 2011-11-24 ENCOUNTER — Other Ambulatory Visit: Payer: Self-pay | Admitting: Family Medicine

## 2011-11-28 ENCOUNTER — Other Ambulatory Visit: Payer: Self-pay | Admitting: Family Medicine

## 2011-11-28 MED ORDER — TAMSULOSIN HCL 0.4 MG PO CAPS: 0.4000 mg | ORAL_CAPSULE | Freq: Every day | ORAL | Status: DC

## 2011-12-02 ENCOUNTER — Ambulatory Visit (INDEPENDENT_AMBULATORY_CARE_PROVIDER_SITE_OTHER): Payer: Medicare Other | Admitting: Pharmacist

## 2011-12-02 DIAGNOSIS — I359 Nonrheumatic aortic valve disorder, unspecified: Secondary | ICD-10-CM

## 2011-12-02 DIAGNOSIS — Z7901 Long term (current) use of anticoagulants: Secondary | ICD-10-CM | POA: Diagnosis not present

## 2011-12-02 DIAGNOSIS — Z952 Presence of prosthetic heart valve: Secondary | ICD-10-CM

## 2011-12-02 DIAGNOSIS — I350 Nonrheumatic aortic (valve) stenosis: Secondary | ICD-10-CM

## 2011-12-02 DIAGNOSIS — Z954 Presence of other heart-valve replacement: Secondary | ICD-10-CM | POA: Diagnosis not present

## 2011-12-02 LAB — POCT INR: INR: 3.7

## 2011-12-22 ENCOUNTER — Telehealth: Payer: Self-pay | Admitting: Family Medicine

## 2011-12-22 NOTE — Telephone Encounter (Signed)
Left vm for pt to return call, pt is due for AWV with Rosalita Chessman, sent letter to pt in April and was following up to see if pt wanted to schedule appt.

## 2011-12-23 ENCOUNTER — Ambulatory Visit (INDEPENDENT_AMBULATORY_CARE_PROVIDER_SITE_OTHER): Payer: Medicare Other | Admitting: *Deleted

## 2011-12-23 DIAGNOSIS — Z954 Presence of other heart-valve replacement: Secondary | ICD-10-CM

## 2011-12-23 DIAGNOSIS — I350 Nonrheumatic aortic (valve) stenosis: Secondary | ICD-10-CM

## 2011-12-23 DIAGNOSIS — I359 Nonrheumatic aortic valve disorder, unspecified: Secondary | ICD-10-CM | POA: Diagnosis not present

## 2011-12-23 DIAGNOSIS — Z7901 Long term (current) use of anticoagulants: Secondary | ICD-10-CM

## 2011-12-23 DIAGNOSIS — Z952 Presence of prosthetic heart valve: Secondary | ICD-10-CM

## 2011-12-30 ENCOUNTER — Other Ambulatory Visit: Payer: Self-pay | Admitting: Family Medicine

## 2012-01-04 ENCOUNTER — Ambulatory Visit (INDEPENDENT_AMBULATORY_CARE_PROVIDER_SITE_OTHER): Payer: Medicare Other | Admitting: Pharmacist

## 2012-01-04 DIAGNOSIS — Z7901 Long term (current) use of anticoagulants: Secondary | ICD-10-CM | POA: Diagnosis not present

## 2012-01-04 DIAGNOSIS — Z954 Presence of other heart-valve replacement: Secondary | ICD-10-CM

## 2012-01-04 DIAGNOSIS — I359 Nonrheumatic aortic valve disorder, unspecified: Secondary | ICD-10-CM | POA: Diagnosis not present

## 2012-01-04 DIAGNOSIS — I350 Nonrheumatic aortic (valve) stenosis: Secondary | ICD-10-CM

## 2012-01-04 DIAGNOSIS — Z952 Presence of prosthetic heart valve: Secondary | ICD-10-CM

## 2012-01-05 DIAGNOSIS — M1710 Unilateral primary osteoarthritis, unspecified knee: Secondary | ICD-10-CM | POA: Diagnosis not present

## 2012-01-10 ENCOUNTER — Ambulatory Visit (INDEPENDENT_AMBULATORY_CARE_PROVIDER_SITE_OTHER): Payer: Medicare Other | Admitting: Family Medicine

## 2012-01-10 ENCOUNTER — Encounter: Payer: Self-pay | Admitting: Family Medicine

## 2012-01-10 VITALS — BP 124/70 | HR 48 | Temp 97.7°F | Ht 65.5 in | Wt 197.0 lb

## 2012-01-10 DIAGNOSIS — Z23 Encounter for immunization: Secondary | ICD-10-CM

## 2012-01-10 DIAGNOSIS — R269 Unspecified abnormalities of gait and mobility: Secondary | ICD-10-CM

## 2012-01-10 NOTE — Progress Notes (Signed)
  Subjective:    Patient ID: Juan Li, male    DOB: February 01, 1932, 78 y.o.   MRN: [id number]  HPI Here to evaluate for peripheral neuropathy  Is concerned that people at church have suggested he may have peripheral neuropathy due to his abnormal gait.  Denies pain in feet, numbness.    Abdnomal gait is chronic issue, no recent falls but notes is unsteady.  Uses a pronged cane.    Is scheduled for knee replacement surgery next month  I have reviewed patient's  PMH, FH, and Social history and Medications as related to this visit. Parkinsons, chronic low back pain  Review of Systems    see HPI Objective:   Physical Exam GEN: Alert & Oriented, No acute distress CV:  Regular Rate & Rhythm, no murmur Respiratory:  Normal work of breathing, CTAB Abd:  + BS, soft, no tenderness to palpation MSK;  Kyphotic, slowed movement.  Neg rhomberg.  monifilament an proprioception normal.  COgwheeling of arms, left > right        Assessment & Plan:

## 2012-01-10 NOTE — Patient Instructions (Addendum)
You do not have neuropathy  I would recommend you do physical therapy after your knee surgery to help with your gait and balance  If you notice continued blood pressures above 140/90, please let us know so we can adjust your medicines

## 2012-01-10 NOTE — Assessment & Plan Note (Signed)
Chronic Gait abnormality multifactorial with chronic low back pain, knee arthritis, parkinsons all contributing. No significant change in functional status or pain.  Advised physical therapy- will defer until has knee replacement next month which will change mechanics and will also require physical therapy.  Will follow-up with PCP after surgery for follow-up of gait

## 2012-01-13 ENCOUNTER — Encounter: Payer: Self-pay | Admitting: Family Medicine

## 2012-01-13 ENCOUNTER — Ambulatory Visit (INDEPENDENT_AMBULATORY_CARE_PROVIDER_SITE_OTHER): Payer: Medicare Other | Admitting: Pharmacist

## 2012-01-13 DIAGNOSIS — I350 Nonrheumatic aortic (valve) stenosis: Secondary | ICD-10-CM

## 2012-01-13 DIAGNOSIS — I359 Nonrheumatic aortic valve disorder, unspecified: Secondary | ICD-10-CM

## 2012-01-13 DIAGNOSIS — Z954 Presence of other heart-valve replacement: Secondary | ICD-10-CM

## 2012-01-13 DIAGNOSIS — Z952 Presence of prosthetic heart valve: Secondary | ICD-10-CM

## 2012-01-13 DIAGNOSIS — Z7901 Long term (current) use of anticoagulants: Secondary | ICD-10-CM | POA: Diagnosis not present

## 2012-01-13 LAB — POCT INR: INR: 3.5

## 2012-01-16 ENCOUNTER — Other Ambulatory Visit: Payer: Self-pay | Admitting: Family Medicine

## 2012-01-17 ENCOUNTER — Encounter: Payer: Self-pay | Admitting: Family Medicine

## 2012-01-23 ENCOUNTER — Encounter: Payer: Self-pay | Admitting: Family Medicine

## 2012-01-24 ENCOUNTER — Other Ambulatory Visit: Payer: Self-pay | Admitting: Family Medicine

## 2012-01-24 ENCOUNTER — Other Ambulatory Visit: Payer: Self-pay | Admitting: *Deleted

## 2012-01-24 DIAGNOSIS — M159 Polyosteoarthritis, unspecified: Secondary | ICD-10-CM

## 2012-01-24 MED ORDER — HYDROCODONE-ACETAMINOPHEN 10-500 MG PO TABS: 1.0000 | ORAL_TABLET | Freq: Four times a day (QID) | ORAL | Status: DC

## 2012-01-24 NOTE — Telephone Encounter (Signed)
Rx for controled substances called

## 2012-01-25 ENCOUNTER — Other Ambulatory Visit: Payer: Self-pay | Admitting: Family Medicine

## 2012-01-27 ENCOUNTER — Ambulatory Visit (INDEPENDENT_AMBULATORY_CARE_PROVIDER_SITE_OTHER): Payer: Medicare Other

## 2012-01-27 DIAGNOSIS — Z7901 Long term (current) use of anticoagulants: Secondary | ICD-10-CM | POA: Diagnosis not present

## 2012-01-27 DIAGNOSIS — Z952 Presence of prosthetic heart valve: Secondary | ICD-10-CM

## 2012-01-27 DIAGNOSIS — Z954 Presence of other heart-valve replacement: Secondary | ICD-10-CM | POA: Diagnosis not present

## 2012-01-27 DIAGNOSIS — I359 Nonrheumatic aortic valve disorder, unspecified: Secondary | ICD-10-CM | POA: Diagnosis not present

## 2012-01-27 DIAGNOSIS — I350 Nonrheumatic aortic (valve) stenosis: Secondary | ICD-10-CM

## 2012-01-27 LAB — POCT INR: INR: 2

## 2012-02-01 ENCOUNTER — Encounter: Payer: Self-pay | Admitting: Cardiology

## 2012-02-01 ENCOUNTER — Ambulatory Visit (INDEPENDENT_AMBULATORY_CARE_PROVIDER_SITE_OTHER): Payer: Medicare Other | Admitting: Cardiology

## 2012-02-01 ENCOUNTER — Other Ambulatory Visit: Payer: Self-pay | Admitting: Orthopedic Surgery

## 2012-02-01 VITALS — BP 150/64 | HR 50 | Ht 65.5 in | Wt 197.4 lb

## 2012-02-01 DIAGNOSIS — Z954 Presence of other heart-valve replacement: Secondary | ICD-10-CM

## 2012-02-01 DIAGNOSIS — I359 Nonrheumatic aortic valve disorder, unspecified: Secondary | ICD-10-CM

## 2012-02-01 DIAGNOSIS — Z952 Presence of prosthetic heart valve: Secondary | ICD-10-CM

## 2012-02-01 DIAGNOSIS — I10 Essential (primary) hypertension: Secondary | ICD-10-CM | POA: Diagnosis not present

## 2012-02-01 DIAGNOSIS — E7800 Pure hypercholesterolemia, unspecified: Secondary | ICD-10-CM

## 2012-02-01 LAB — HEPATIC FUNCTION PANEL
AST: 25 U/L (ref 0–37)
Albumin: 4.3 g/dL (ref 3.5–5.2)
Alkaline Phosphatase: 46 U/L (ref 39–117)
Total Protein: 7.6 g/dL (ref 6.0–8.3)

## 2012-02-01 LAB — BASIC METABOLIC PANEL
CO2: 24 mEq/L (ref 19–32)
Calcium: 9.4 mg/dL (ref 8.4–10.5)
Chloride: 104 mEq/L (ref 96–112)
Creatinine, Ser: 1.6 mg/dL — ABNORMAL HIGH (ref 0.4–1.5)
Sodium: 137 mEq/L (ref 135–145)

## 2012-02-01 LAB — LIPID PANEL
Cholesterol: 202 mg/dL — ABNORMAL HIGH (ref 0–200)
Total CHOL/HDL Ratio: 6
Triglycerides: 176 mg/dL — ABNORMAL HIGH (ref 0.0–149.0)
VLDL: 35.2 mg/dL (ref 0.0–40.0)

## 2012-02-01 LAB — LDL CHOLESTEROL, DIRECT: Direct LDL: 135.5 mg/dL

## 2012-02-01 MED ORDER — BUPIVACAINE 0.25 % ON-Q PUMP SINGLE CATH 300ML: 300.0000 mL | INJECTION | Status: DC

## 2012-02-01 MED ORDER — DEXAMETHASONE SODIUM PHOSPHATE 10 MG/ML IJ SOLN: 10.0000 mg | Freq: Once | INTRAMUSCULAR | Status: DC

## 2012-02-01 NOTE — Progress Notes (Addendum)
Patient ID: Juan Li, male   DOB: 06/09/1931, 78 y.o.   MRN: [id number] PCP: Dr. Leveda Li  78 yo with history of AS s/p St Jude AVR presents for cardiology followup.  His valve was replaced in 1993.  Echo in 9/11 showed normal EF and a well-seated mechanical valve.  Cardiac cath in 1993 showed nonobstructive disease.  He has been started on Namendo and Aricept for some degree of dementia. From a cardiac standpoint, he has been doing well.  He has a number of orthopedic limitations.  He walks with a cane because of knee and back pain.  He denies exertional dyspnea though he is not very active.  He is able to climb a flight of steps.  He uses a 4-pronged cane due to balance trouble (thought to be related to spinal stenosis).  No chest pain.  No problems with bleeding on coumadin.  He has been talking with a surgeon about right TKR.  SBP 150 initially but 142 when I rechecked it.   ECG: NSR, HR 50  Labs (1/12): K 4.8, creatinine 1.3 Labs (12/12): creatinine 1.2, LDL 177 Labs (10/13): creatinine 1.6, LDL 135  PMH: 1. Dementia: On Aricept and Namenda.   2. Hyperlipidemia: myalgias with Vytorin 3. BPH 4. Osteoarthritis right knee 5. Cervical myelopathy  6. Obesity 7. HTN 8. Aortic stenosis: St. Jude mechanical aortic valve placed in 1993. Echo (9/11) with EF 55-60%, mild LV hypertrophy, normally functioning mechanical aortic valve, moderate diastolic dysfunction, normal RV.   9. Cardiac cath in 1993 with nonobstructive disease.  10. L-spine disease/spinal stenosis.  11. Abdominal ultrasound in 12/11 did not show AAA 12. Gout 13. Shoulder surgery 1/12 14. 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  Married.  Does not smoke.   FH: No premature CAD.   ROS: All systems reviewed and negative except as per HPI.   Current Outpatient Prescriptions  Medication Sig Dispense Refill  . amLODipine (NORVASC) 5 MG tablet Take 5 mg by mouth daily.        Marland Kitchen aspirin EC 81 MG tablet Take 1  tablet (81 mg total) by mouth daily.      . AVODART 0.5 MG capsule TAKE 1 CAPSULE AT NIGHT  90 capsule  3  . Coenzyme Q-10 200 MG CAPS Take by mouth.      . cyclobenzaprine (FLEXERIL) 5 MG tablet TAKE 1 TABLET EVERY NIGHT  90 tablet  3  . donepezil (ARICEPT) 5 MG tablet ONE BY MOUTH DAILY  90 tablet  3  . enalapril (VASOTEC) 20 MG tablet TAKE 1 TABLET TWICE A DAY  180 tablet  3  . hydrochlorothiazide (HYDRODIURIL) 12.5 MG tablet       . hydrochlorothiazide (HYDRODIURIL) 12.5 MG tablet TAKE 1 TABLET DAILY  90 tablet  3  . HYDROcodone-acetaminophen (LORTAB) 10-500 MG per tablet Take 1 tablet by mouth 4 (four) times daily.  360 tablet  1  . metoprolol (LOPRESSOR) 50 MG tablet TAKE 1 TABLET TWICE A DAY  180 tablet  3  . NAMENDA 10 MG tablet TAKE 1 TABLET TWICE A DAY  180 tablet  3  . omeprazole (PRILOSEC) 20 MG capsule Take 1 capsule (20 mg total) by mouth daily.  90 capsule  3  . pravastatin (PRAVACHOL) 40 MG tablet TAKE 1 TABLET EVERY EVENING  30 tablet  6  . Tamsulosin HCl (FLOMAX) 0.4 MG CAPS Take 1 capsule (0.4 mg total) by mouth daily.  90 capsule  3  .  warfarin (COUMADIN) 5 MG tablet TAKE AS DIRECTED  100 tablet  PRN   No current facility-administered medications for this visit.   Facility-Administered Medications Ordered in Other Visits  Medication Dose Route Frequency Provider Last Rate Last Dose  . DISCONTD: bupivacaine 0.25 % ON-Q pump SINGLE CATH 300 mL  300 mL Other Continuous Juan Perkins, PA      . DISCONTD: dexamethasone (DECADRON) injection 10 mg  10 mg Intravenous Once Juan Perkins, PA        BP 150/64  Pulse 50  Ht 5' 5.5" (1.664 m)  Wt 197 lb 6.4 oz (89.54 kg)  BMI 32.35 kg/m2 General: NAD, obese Neck: No JVD, no thyromegaly or thyroid nodule.  Lungs: Clear to auscultation bilaterally with normal respiratory effort. CV: Nondisplaced PMI.  Heart regular S1/S2, mechanical S2, no S3/S4, 1/6 SEM RUSB.  Trace ankle edema.  No carotid bruit.  Normal pedal  pulses.  Abdomen: Soft, nontender, no hepatosplenomegaly, no distention.  Neurologic: Alert and oriented x 3.  Psych: Normal affect. Extremities: No clubbing or cyanosis.   Assessment/Plan:  Aortic valve disorders  Mechanical aortic valve. No significant exertional dyspnea or chest pain. No bleeding problems on coumadin. Continue ASA 81 also.   As he is considering knee surgery, I will get an echo to reassess the valve and LV EF prior to procedure.  HYPERCHOLESTEROLEMIA  LDL better on pravastatin but still high.  Would like at least < 130.  Will increase pravastatin to 80 mg dailiy.  He has tolerated pravastatin without myalgias.   HYPERTENSION BP mildly elevated today.  Creatinine today was 1.6, which is higher than in the past.  I will have him decrease enalapril to 20 mg daily and increase amlodipine to 10 mg daily.  Repeat BMET in 2 wks.   Preoperative evaluation Plan for possible right TKR this month.  I think that he is of acceptable risk to undergo surgery.   Continue metoprolol peri-operatively.  When he comes off coumadin and INR < 2, would bridge with Lovenox or IV heparin given the mechanical valve.  I do not think he needs a pre-operative stress test.  He has not had significant dyspnea or chest pain and is able to climb a flight of steps without trouble.   Juan Li 02/01/2012

## 2012-02-01 NOTE — Progress Notes (Signed)
Preoperative surgical orders have been place into the Epic hospital system for Juan Li on 02/01/2012, 4:22 PM  by Patrica Duel for surgery on 02/20/12.  Preop Total Knee orders including Bupivacaine On-Q pump, IV Tylenol, and IV Decadron as long as there are no contraindications to the above medications. [name], PA-C

## 2012-02-01 NOTE — Patient Instructions (Addendum)
Your physician recommends that you have  a FASTING lipid profile /liver profile /BMET today.  Your physician has requested that you have an echocardiogram. Echocardiography is a painless test that uses sound waves to create images of your heart. It provides your doctor with information about the size and shape of your heart and how well your heart's chambers and valves are working. This procedure takes approximately one hour. There are no restrictions for this procedure.  Your physician wants you to follow-up in: 1 year with Dr Shirlee Latch. (October 2014). You will receive a reminder letter in the mail two months in advance. If you don't receive a letter, please call our office to schedule the follow-up appointment.

## 2012-02-02 ENCOUNTER — Telehealth: Payer: Self-pay

## 2012-02-02 DIAGNOSIS — I10 Essential (primary) hypertension: Secondary | ICD-10-CM

## 2012-02-02 DIAGNOSIS — E7800 Pure hypercholesterolemia, unspecified: Secondary | ICD-10-CM

## 2012-02-02 DIAGNOSIS — M1710 Unilateral primary osteoarthritis, unspecified knee: Secondary | ICD-10-CM | POA: Diagnosis not present

## 2012-02-02 MED ORDER — AMLODIPINE BESYLATE 10 MG PO TABS: 10.0000 mg | ORAL_TABLET | Freq: Every day | ORAL | Status: DC

## 2012-02-02 MED ORDER — PRAVASTATIN SODIUM 80 MG PO TABS: 80.0000 mg | ORAL_TABLET | Freq: Every day | ORAL | Status: DC

## 2012-02-02 MED ORDER — ENALAPRIL MALEATE 20 MG PO TABS: 20.0000 mg | ORAL_TABLET | Freq: Every day | ORAL | Status: DC

## 2012-02-02 NOTE — Telephone Encounter (Signed)
**Note De-Identified  Obfuscation** Pt. and his wife advised of lab results and Dr. Alford Highland recommendations, they both verbalized understanding. [facility]'s sent to [facility] on [address] to fill, pt. Aware. Labs will be drawn on 12-3./LV

## 2012-02-02 NOTE — Telephone Encounter (Signed)
**Note De-Identified Malia Corsi Obfuscation** Message copied by Demetrios Loll on Thu Feb 02, 2012  4:47 PM ------      Message from: Laurey Morale      Created: Wed Feb 01, 2012 10:32 PM       Would like to see LDL ideally < 130, best < 960.  Increase pravastatin to 80 mg daily with lipids/LFTs in 2 months.  Creatinine is higher.  Would have him decrease enalapril to 20 mg daily and increase amlodipine to 10 mg daily.

## 2012-02-03 ENCOUNTER — Telehealth: Payer: Self-pay | Admitting: Cardiology

## 2012-02-03 ENCOUNTER — Other Ambulatory Visit: Payer: Self-pay | Admitting: *Deleted

## 2012-02-03 MED ORDER — AMLODIPINE BESYLATE 10 MG PO TABS: 5.0000 mg | ORAL_TABLET | Freq: Every day | ORAL | Status: DC

## 2012-02-03 NOTE — Telephone Encounter (Signed)
Patient wife returning nurse call [phone], she also has questions about medication increase for upcoming procedure.

## 2012-02-03 NOTE — Telephone Encounter (Signed)
Ms slager calling to say her husbands med list not correct--the (PCP)--Had d/ced his amlodipine in April of this year and did not take off med list--dr mclean wanted him to increase amlodipine to 10mg  qd--she does not have any left--and she thinks he is taking enough medication now --metoprolol,  HCTZ, and enalapril--she would like a call back as to wether he needs to start amlodipine again--advised i would pass message along to anne and dr Shirlee Latch and they will give her a call back

## 2012-02-03 NOTE — Telephone Encounter (Signed)
His BP was already high and I had him decrease enalapril from 40=>20 at last appointment due to elevated creatinine.  His BP may be quite high with lower enalapril and no other changes.  He really ought to at least start on amlodipine 5 mg daily.

## 2012-02-03 NOTE — Telephone Encounter (Signed)
Spoke with ms Almendariz and advised pt needs to add amlodipine 5 mg 1 poqd to med list--[facility] called [facility] [address] and wife states she will start med

## 2012-02-03 NOTE — Telephone Encounter (Signed)
[name] PA from Dr. Clide Dales office Orthopedic surgeon called  Regarding pt having orthopedic surgery on 10 /21/13 and want to let Dr. Shirlee Latch know and to make sure  this is okay . Pt will get off coumadin 5 days prior to surgery and will be Lovenox   Bridge qd until surgery and then pt will be put back on coumadin. I let Kenard Gower PA know that pt was seen in the office 02/01/12 by Dr. Shirlee Latch.

## 2012-02-03 NOTE — Telephone Encounter (Signed)
Pt having surgery on 10/21 and Kenard Gower the PA wanted to give a fyi and make sure this is ok with Dr.Mclean  Pt will get off coumadin 5 days prior to surgery and he will be Lovanox 40mg  qd bridged until surgery and then they will put him back on his coumadin

## 2012-02-05 NOTE — Telephone Encounter (Signed)
Please refax note to orthopedic surgeon's office in which issues with surgery are discussed.

## 2012-02-06 ENCOUNTER — Telehealth: Payer: Self-pay | Admitting: *Deleted

## 2012-02-06 NOTE — Telephone Encounter (Signed)
Left message on patients answering machine, we will do lovenox briding when patient comes in for coumadin clinic appt on 02/10/2012, he is for TKR on 02/20/2012  Wt--89.54 [place]--1.6 Age--2 CC--48.18 lovenox 80 mg bid  Addison Lank, RN

## 2012-02-06 NOTE — Telephone Encounter (Signed)
Message copied by Carmela Hurt on Mon Feb 06, 2012  9:36 AM ------      Message from: [name]      Created: Mon Feb 06, 2012  7:15 AM       He needs to be bridged with lovenox when he holds coumadin for upcoming surgery.

## 2012-02-06 NOTE — Telephone Encounter (Signed)
Note resent to Dr Despina Hick.

## 2012-02-07 ENCOUNTER — Telehealth: Payer: Self-pay | Admitting: Cardiology

## 2012-02-07 DIAGNOSIS — E7800 Pure hypercholesterolemia, unspecified: Secondary | ICD-10-CM

## 2012-02-07 NOTE — Telephone Encounter (Signed)
New Problem:    Patient called in returning Anne's call possibly in regards to his upcomming surgery on 02/20/12.  Please call back.

## 2012-02-07 NOTE — Telephone Encounter (Signed)
Message copied by Burnell Blanks on Tue Feb 07, 2012 11:24 AM ------      Message from: Laurey Morale      Created: Wed Feb 01, 2012 10:32 PM       Would like to see LDL ideally < 130, best < 161.  Increase pravastatin to 80 mg daily with lipids/LFTs in 2 months.  Creatinine is higher.  Would have him decrease enalapril to 20 mg daily and increase amlodipine to 10 mg daily.

## 2012-02-07 NOTE — Telephone Encounter (Signed)
Advised patient, verbalized understanding. Patient will be in hospital with knee surgery in 2 weeks at [facility]. Will forward to Thurston Hole so she can look for labs (bmet) in [place]. Did schedule 2 month lipid/LFT's labs

## 2012-02-08 ENCOUNTER — Ambulatory Visit (INDEPENDENT_AMBULATORY_CARE_PROVIDER_SITE_OTHER): Payer: Medicare Other | Admitting: Pharmacist

## 2012-02-08 ENCOUNTER — Ambulatory Visit (HOSPITAL_COMMUNITY): Payer: Medicare Other | Attending: Cardiovascular Disease | Admitting: Radiology

## 2012-02-08 DIAGNOSIS — E7800 Pure hypercholesterolemia, unspecified: Secondary | ICD-10-CM

## 2012-02-08 DIAGNOSIS — I359 Nonrheumatic aortic valve disorder, unspecified: Secondary | ICD-10-CM

## 2012-02-08 DIAGNOSIS — Z7901 Long term (current) use of anticoagulants: Secondary | ICD-10-CM

## 2012-02-08 DIAGNOSIS — Z954 Presence of other heart-valve replacement: Secondary | ICD-10-CM | POA: Diagnosis not present

## 2012-02-08 DIAGNOSIS — I350 Nonrheumatic aortic (valve) stenosis: Secondary | ICD-10-CM

## 2012-02-08 DIAGNOSIS — I10 Essential (primary) hypertension: Secondary | ICD-10-CM | POA: Diagnosis not present

## 2012-02-08 DIAGNOSIS — Z952 Presence of prosthetic heart valve: Secondary | ICD-10-CM

## 2012-02-08 MED ORDER — ENOXAPARIN SODIUM 80 MG/0.8ML ~~LOC~~ SOLN: 80.0000 mg | Freq: Two times a day (BID) | SUBCUTANEOUS | Status: DC

## 2012-02-08 NOTE — Patient Instructions (Signed)
10/16- Last dose of Coumadin  10/17- No Coumadin or Lovenox 10/18- Lovenox 80mg  in AM and PM 10/19- Lovenox 80mg  in AM and PM 10/20- Lovenox 80mg  in AM only 10/21- Day of Procedure.  Once you are discharged from rehab, please call us to follow up with your INR.

## 2012-02-08 NOTE — Progress Notes (Signed)
Echocardiogram performed.  

## 2012-02-09 ENCOUNTER — Encounter (HOSPITAL_COMMUNITY): Payer: Self-pay | Admitting: Pharmacy Technician

## 2012-02-14 ENCOUNTER — Telehealth: Payer: Self-pay | Admitting: *Deleted

## 2012-02-14 ENCOUNTER — Telehealth: Payer: Self-pay | Admitting: Cardiology

## 2012-02-14 NOTE — Telephone Encounter (Signed)
I will forward to [facility] 

## 2012-02-14 NOTE — Telephone Encounter (Signed)
See additional phone note from today.  Lelon Perla, RN spoke with pt to clarify instructions.

## 2012-02-14 NOTE — Telephone Encounter (Signed)
Talked with pt at length regarding when to stop coumadin and begin lovenox injections. Pt found last visit note at coumadin clinic with dosage instructions regarding Lovenox and went over those with pt and he states he does understand directions now

## 2012-02-14 NOTE — Telephone Encounter (Signed)
Pt has surgery 10-21, and needs to know when to start lovanox shots? pls call

## 2012-02-15 ENCOUNTER — Ambulatory Visit (HOSPITAL_COMMUNITY)
Admission: RE | Admit: 2012-02-15 | Discharge: 2012-02-15 | Disposition: A | Discharge status: Home / Self Care | Payer: Medicare Other | Source: Ambulatory Visit | Attending: Orthopedic Surgery | Admitting: Orthopedic Surgery

## 2012-02-15 ENCOUNTER — Encounter (HOSPITAL_COMMUNITY): Payer: Self-pay

## 2012-02-15 ENCOUNTER — Encounter (HOSPITAL_COMMUNITY)
Admission: RE | Admit: 2012-02-15 | Discharge: 2012-02-15 | Disposition: A | Discharge status: Home / Self Care | Payer: Medicare Other | Source: Ambulatory Visit | Attending: Orthopedic Surgery | Admitting: Orthopedic Surgery

## 2012-02-15 DIAGNOSIS — I10 Essential (primary) hypertension: Secondary | ICD-10-CM | POA: Diagnosis not present

## 2012-02-15 DIAGNOSIS — Z01812 Encounter for preprocedural laboratory examination: Secondary | ICD-10-CM | POA: Diagnosis not present

## 2012-02-15 DIAGNOSIS — Z87891 Personal history of nicotine dependence: Secondary | ICD-10-CM | POA: Diagnosis not present

## 2012-02-15 DIAGNOSIS — Z01818 Encounter for other preprocedural examination: Secondary | ICD-10-CM | POA: Insufficient documentation

## 2012-02-15 DIAGNOSIS — T8859XA Other complications of anesthesia, initial encounter: Secondary | ICD-10-CM

## 2012-02-15 DIAGNOSIS — M1990 Unspecified osteoarthritis, unspecified site: Secondary | ICD-10-CM

## 2012-02-15 DIAGNOSIS — I2510 Atherosclerotic heart disease of native coronary artery without angina pectoris: Secondary | ICD-10-CM

## 2012-02-15 DIAGNOSIS — E7800 Pure hypercholesterolemia, unspecified: Secondary | ICD-10-CM

## 2012-02-15 HISTORY — DX: Atherosclerotic heart disease of native coronary artery without angina pectoris: I25.10

## 2012-02-15 HISTORY — DX: Unspecified osteoarthritis, unspecified site: M19.90

## 2012-02-15 HISTORY — DX: Adverse effect of unspecified anesthetic, initial encounter: T41.45XA

## 2012-02-15 HISTORY — DX: Essential (primary) hypertension: I10

## 2012-02-15 HISTORY — DX: Pure hypercholesterolemia, unspecified: E78.00

## 2012-02-15 HISTORY — DX: Other complications of anesthesia, initial encounter: T88.59XA

## 2012-02-15 HISTORY — PX: SHOULDER OPEN ROTATOR CUFF REPAIR: SHX2407

## 2012-02-15 LAB — COMPREHENSIVE METABOLIC PANEL
ALT: 11 U/L (ref 0–53)
AST: 21 U/L (ref 0–37)
Albumin: 4.2 g/dL (ref 3.5–5.2)
Alkaline Phosphatase: 59 U/L (ref 39–117)
BUN: 23 mg/dL (ref 6–23)
Chloride: 103 mEq/L (ref 96–112)
Potassium: 4.6 mEq/L (ref 3.5–5.1)
Sodium: 138 mEq/L (ref 135–145)
Total Bilirubin: 1 mg/dL (ref 0.3–1.2)

## 2012-02-15 LAB — URINALYSIS, ROUTINE W REFLEX MICROSCOPIC
Bilirubin Urine: NEGATIVE
Glucose, UA: NEGATIVE mg/dL
Hgb urine dipstick: NEGATIVE
Specific Gravity, Urine: 1.021 (ref 1.005–1.030)
pH: 6.5 (ref 5.0–8.0)

## 2012-02-15 LAB — SURGICAL PCR SCREEN: Staphylococcus aureus: NEGATIVE

## 2012-02-15 LAB — CBC
HCT: 43.5 % (ref 39.0–52.0)
RDW: 13.6 % (ref 11.5–15.5)
WBC: 8.1 10*3/uL (ref 4.0–10.5)

## 2012-02-15 LAB — APTT: aPTT: 42 seconds — ABNORMAL HIGH (ref 24–37)

## 2012-02-15 LAB — PROTIME-INR: Prothrombin Time: 20.9 seconds — ABNORMAL HIGH (ref 11.6–15.2)

## 2012-02-15 NOTE — Pre-Procedure Instructions (Signed)
02-15-12 EKG/Echo( 02-01-12) Epic. CXR done today. W. Kennon Portela

## 2012-02-15 NOTE — Patient Instructions (Addendum)
Juan Li  02/15/2012   Your procedure is scheduled on:10-21--2013  Report to [facility] at    0515    AM..  Call this number if you have problems the morning of surgery: [phone]  Or Presurgical Testing [phone](Darius Lundberg)   Remember:   Do not eat food:After Midnight.  Use Lovenox injections as directed. Take last dose 10-Juan-13 Sunday AM. Stop Coumadin as directed and Aspirin.  Take these medicines the morning of surgery with A SIP OF WATER: Hydrocodone. Omeprazole. Flomax. Amlodipine. Namenda. Metoprolol. .Pravastatin.   Do not wear jewelry, make-up or nail polish.  Do not wear lotions, powders, or perfumes. You may wear deodorant.  Do not shave 48 hours prior to surgery.(face and neck okay, no shaving of legs)  Do not bring valuables to the hospital.  Contacts, dentures or bridgework may not be worn into surgery.  Leave suitcase in the car. After surgery it may be brought to your room.  For patients admitted to the hospital, checkout time is 11:00 AM the day of discharge.   Patients discharged the day of surgery will not be allowed to drive home. Must have responsible person with you x 24 hours once discharged.  Name and phone number of your driver:Juan Li, spouse [phone]/ [phone]   Special Instructions: CHG Shower Use Special Wash: see special instruction sheet.(avoid face and genitals)   Please read over the following fact sheets that you were given: MRSA Information, Blood Transfusion fact sheet, Incentive Spirometry Instruction.

## 2012-02-17 ENCOUNTER — Telehealth: Payer: Self-pay | Admitting: Cardiology

## 2012-02-17 NOTE — Telephone Encounter (Signed)
Spoke with pt.  He started taking Lovenox on 10/16 rather than 10/18 and wants to know what to do.  Explained to pt this is fine.  He verified that he took all of his Coumadin out of his pill box.  He is going to be 2 injections short on Lovenox but will call the pharmacy today and pick up the refill from the original prescription.

## 2012-02-17 NOTE — Telephone Encounter (Signed)
New Problem:    Patient called in because he believes that he messed his medications up and would like someone to take a look at them with him.  Please call back.

## 2012-02-19 ENCOUNTER — Other Ambulatory Visit: Payer: Self-pay | Admitting: Orthopedic Surgery

## 2012-02-19 NOTE — H&P (Signed)
Juan Li  DOB: February 11, 1932 Married / Language: English / Race: White Male  Date of Admission:  02/20/12  Chief Complaint:  Right Knee Pain  History of Present Illness The patient is a 78 year old male who comes in for a preoperative History and Physical. The patient is scheduled for a right total knee arthroplasty to be performed by Dr. Gus Li. [name], MD at [facility] on 02/20/2012. The patient is a 78 year old male who is being followed for their right knee pain and osteoarthritis. Symptoms reported today include: pain and swelling. The patient feels that they are doing poorly and report their pain level to be moderate to severe. The following medication has been used for pain control: Hydrocodone. Juan Li comes in for evaluation of his right knee. He has been having more and more discomfort in his knee and got a cortisone shot prior to his surgery. He has not had any significant new trauma, but did twist his knee a little bit and it seemed to be bothering him a little more since. No new catching, locking, or giving way. He has tried the gel shots and they helped very little. He is now ready to procedd with knee replacement. They have been treated conservatively in the past for the above stated problem and despite conservative measures, they continue to have progressive pain and severe functional limitations and dysfunction. They have failed non-operative management including home exercise, medications, and injections. It is felt that they would benefit from undergoing total joint replacement. Risks and benefits of the procedure have been discussed with the patient and they elect to proceed with surgery. There are no active contraindications to surgery such as ongoing infection or rapidly progressive neurological disease.   Problem List/Past Medical Osteoarthritis, Hip (715.35)  Allergies No Known Drug Allergies   Family History Hypertension. mother and  father   Social History Pain Contract. no Marital status. married Illicit drug use. no Children. 2 Exercise. Exercises weekly; does other Number of flights of stairs before winded. greater than 5 Living situation. live with spouse Tobacco / smoke exposure. no Current work status. retired Alcohol use. current drinker; drinks beer; only occasionally per week Drug/Alcohol Rehab (Previously). no Tobacco use. former smoker; smoke(d) 1 pack(s) per day Drug/Alcohol Rehab (Currently). no   Medication History AmLODIPine Besylate (5MG  Tablet, Oral) Active. Aspirin EC (81MG  Tablet DR, Oral) Active. Avodart (0.5MG  Capsule, Oral) Active. Co Q10 (200MG  Capsule, Oral) Active. Cyclobenzaprine HCl (5MG  Tablet, Oral) Active. Donepezil HCl (5MG  Tablet, Oral) Active. Enalapril Maleate (20MG  Tablet, Oral) Active. Hydrochlorothiazide (12.5MG  Capsule, Oral) Active. Hydrocodone-Acetaminophen (10-500MG  Tablet, Oral) Active. Metoprolol Tartrate (50MG  Tablet, Oral) Active. Namenda (10MG  Tablet, Oral) Active. Omeprazole (20MG  Capsule DR, Oral) Active. Pravastatin Sodium (40MG  Tablet, Oral) Active. Tamsulosin HCl (0.4MG  Capsule, Oral) Active. Warfarin Sodium (5MG  Tablet, Oral) Active.   Past Surgical History Arthroscopy of Knee. right Rotator Cuff Repair. left Carpal Tunnel Repair. left Prostatectomy; Transurethral Aortic Valve Replacement   Medical History Prostate Disease. BPH Rheumatoid Arthritis High blood pressure Coronary artery disease Valvular heart disease Hypercholesterolemia Chronic kidney disease Dementia. Mild Carpal Tunnel Syndrome Compression Cervical Myelopathy Gouty Arthropathy Obesity Gastroesophageal Reflux Achilles Tendonitis Aortic Stenosis Chronic Gait Disturbance  Review of Systems General:Not Present- Chills, Fever, Night Sweats, Fatigue, Weight Gain, Weight Loss and Memory Loss. Skin:Not Present- Hives, Itching, Rash, Eczema and  Lesions. HEENT:Not Present- Tinnitus, Headache, Double Vision, Visual Loss, Hearing Loss and Dentures. Respiratory:Not Present- Shortness of breath with exertion, Shortness of breath at rest, Allergies,  Coughing up blood and Chronic Cough. Cardiovascular:Not Present- Chest Pain, Racing/skipping heartbeats, Difficulty Breathing Lying Down, Murmur, Swelling and Palpitations. Gastrointestinal:Not Present- Bloody Stool, Heartburn, Abdominal Pain, Vomiting, Nausea, Constipation, Diarrhea, Difficulty Swallowing, Jaundice and Loss of appetitie. Male Genitourinary:Not Present- Urinary frequency, Blood in Urine, Weak urinary stream, Discharge, Flank Pain, Incontinence, Painful Urination, Urgency, Urinary Retention and Urinating at Night. Musculoskeletal:Present- Joint Pain. Not Present- Muscle Weakness, Muscle Pain, Joint Swelling, Back Pain, Morning Stiffness and Spasms. Neurological:Not Present- Tremor, Dizziness, Blackout spells, Paralysis, Difficulty with balance and Weakness. Psychiatric:Not Present- Insomnia.   Vitals Weight: 197 lb Height: 66 in Body Surface Area: 2.04 m Body Mass Index: 31.8 kg/m Pulse: 52 (Regular) Resp.: 12 (Unlabored) BP: 146/78 (Sitting, Right Arm, Standard)   Physical Exam The physical exam findings are as follows:  [name] is a 78 year old male with continued knee pain. Patient is accompanied today by his wife Juan Li.   General Mental Status - Alert, cooperative and good historian. General Appearance- pleasant. Not in acute distress. Orientation- Oriented X3. Build & Nutrition- Well nourished and Well developed.   Head and Neck Head- normocephalic, atraumatic . Neck Global Assessment- supple. no bruit auscultated on the right (he does have radiation of the loud mechanical click to the carotids bialterally) and no bruit auscultated on the left (he does have radiation of the loud mechanical click to the carotids  bialterally).   Eye Pupil- Bilateral- PERR Motion- Bilateral- EOMI. wears reading glasses  Chest and Lung Exam Auscultation: Breath sounds:- clear at anterior chest wall and - clear at posterior chest wall. Adventitious sounds:- No Adventitious sounds.   Cardiovascular Auscultation:Rhythm- Regular rate and rhythm. Heart Sounds- S1 WNL and S2 WNL (loud mechanical click). Murmurs & Other Heart Sounds:Auscultation of the heart reveals - No Murmurs.   Abdomen Inspection:Contour- Generalized mild distention. Palpation/Percussion:Tenderness- Abdomen is non-tender to palpation. Rigidity (guarding)- Abdomen is soft. Auscultation:Auscultation of the abdomen reveals - Bowel sounds normal.   Male Genitourinary Not done, not pertinent to present illness  Musculoskeletal He is alert and oriented in no apparent distress. Evaluation of his hip, flexion 100, rotation in 20, out 30, abduction 30 without discomfort. Right knee shows varus deformity. There is no effusion. There is marked crepitus on range of motion. He is tender medial greater than lateral with no instability.  RADIOGRAPHS: AP pelvis, lateral of the right hip do show moderate degenerative change but he is not fully bone on bone. AP both knees and lateral of the right show bone on bone change in the medial and patellofemoral compartments.  Assessment & Plan Osteoarthrosis NOS, lower leg (715.96) Impression: Right Knee  Note: Patient is for a right total knee replacement by Dr. Lequita Halt.  Plan is to go to SNF.  The patient is taking coumadin and will stop 5 days prior to surgery. He will be given a Lovenox bridge preoperatively. He will be placed back on his coumadin postop along with a Lovenox bridge again.  PCP - Dr. Judy Pimple - Patient has been seen preoperatively and felt to be stable for surgery.  He is at a risk for post op delirium due to his history of mild dementia.  He also has chronic  gait disturbance due to a compression cervical myelopathy with chronic leg weakness and numbness. Cardiology - Dr. [name] Urology - Dr. Barron Alvine  Signed electronically by Roberts Gaudy, PA-C

## 2012-02-20 ENCOUNTER — Encounter (HOSPITAL_COMMUNITY): Payer: Self-pay | Admitting: Anesthesiology

## 2012-02-20 ENCOUNTER — Inpatient Hospital Stay (HOSPITAL_COMMUNITY): Payer: Medicare Other | Admitting: Anesthesiology

## 2012-02-20 ENCOUNTER — Inpatient Hospital Stay (HOSPITAL_COMMUNITY)
Admission: RE | Admit: 2012-02-20 | Discharge: 2012-02-23 | DRG: 470 | Disposition: A | Discharge status: Skilled Nursing Facility | Payer: Medicare Other | Source: Ambulatory Visit | Attending: Orthopedic Surgery | Admitting: Orthopedic Surgery

## 2012-02-20 ENCOUNTER — Encounter (HOSPITAL_COMMUNITY): Payer: Self-pay | Admitting: *Deleted

## 2012-02-20 ENCOUNTER — Encounter (HOSPITAL_COMMUNITY)
Admission: RE | Disposition: A | Discharge status: Skilled Nursing Facility | Payer: Self-pay | Source: Ambulatory Visit | Attending: Orthopedic Surgery

## 2012-02-20 DIAGNOSIS — D62 Acute posthemorrhagic anemia: Secondary | ICD-10-CM | POA: Diagnosis not present

## 2012-02-20 DIAGNOSIS — D500 Iron deficiency anemia secondary to blood loss (chronic): Secondary | ICD-10-CM | POA: Diagnosis not present

## 2012-02-20 DIAGNOSIS — R269 Unspecified abnormalities of gait and mobility: Secondary | ICD-10-CM | POA: Diagnosis present

## 2012-02-20 DIAGNOSIS — R29898 Other symptoms and signs involving the musculoskeletal system: Secondary | ICD-10-CM | POA: Diagnosis present

## 2012-02-20 DIAGNOSIS — I10 Essential (primary) hypertension: Secondary | ICD-10-CM | POA: Diagnosis present

## 2012-02-20 DIAGNOSIS — Z954 Presence of other heart-valve replacement: Secondary | ICD-10-CM | POA: Diagnosis not present

## 2012-02-20 DIAGNOSIS — M4712 Other spondylosis with myelopathy, cervical region: Secondary | ICD-10-CM | POA: Diagnosis present

## 2012-02-20 DIAGNOSIS — IMO0002 Reserved for concepts with insufficient information to code with codable children: Secondary | ICD-10-CM | POA: Diagnosis not present

## 2012-02-20 DIAGNOSIS — E871 Hypo-osmolality and hyponatremia: Secondary | ICD-10-CM | POA: Diagnosis not present

## 2012-02-20 DIAGNOSIS — Z96659 Presence of unspecified artificial knee joint: Secondary | ICD-10-CM | POA: Diagnosis not present

## 2012-02-20 DIAGNOSIS — Z5189 Encounter for other specified aftercare: Secondary | ICD-10-CM | POA: Diagnosis not present

## 2012-02-20 DIAGNOSIS — M25569 Pain in unspecified knee: Secondary | ICD-10-CM | POA: Diagnosis not present

## 2012-02-20 DIAGNOSIS — M1710 Unilateral primary osteoarthritis, unspecified knee: Principal | ICD-10-CM | POA: Diagnosis present

## 2012-02-20 DIAGNOSIS — M1990 Unspecified osteoarthritis, unspecified site: Secondary | ICD-10-CM | POA: Diagnosis not present

## 2012-02-20 DIAGNOSIS — F0390 Unspecified dementia without behavioral disturbance: Secondary | ICD-10-CM | POA: Diagnosis not present

## 2012-02-20 DIAGNOSIS — M179 Osteoarthritis of knee, unspecified: Secondary | ICD-10-CM | POA: Diagnosis present

## 2012-02-20 DIAGNOSIS — I2510 Atherosclerotic heart disease of native coronary artery without angina pectoris: Secondary | ICD-10-CM | POA: Diagnosis not present

## 2012-02-20 DIAGNOSIS — E785 Hyperlipidemia, unspecified: Secondary | ICD-10-CM | POA: Diagnosis not present

## 2012-02-20 HISTORY — PX: TOTAL KNEE ARTHROPLASTY: SHX125

## 2012-02-20 LAB — GLUCOSE, CAPILLARY: Glucose-Capillary: 84 mg/dL (ref 70–99)

## 2012-02-20 LAB — TYPE AND SCREEN
ABO/RH(D): B POS
Antibody Screen: NEGATIVE

## 2012-02-20 LAB — PROTIME-INR
INR: 1.13 (ref 0.00–1.49)
Prothrombin Time: 14.3 seconds (ref 11.6–15.2)

## 2012-02-20 SURGERY — ARTHROPLASTY, KNEE, TOTAL
Anesthesia: Spinal | Site: Knee | Laterality: Right | Wound class: Clean

## 2012-02-20 MED ORDER — CEFAZOLIN SODIUM-DEXTROSE 2-3 GM-% IV SOLR
2.0000 g | Freq: Four times a day (QID) | INTRAVENOUS | Status: AC
  Administered 2012-02-20 (×2): 2 g via INTRAVENOUS
  Filled 2012-02-20 (×2): qty 50

## 2012-02-20 MED ORDER — BUPIVACAINE ON-Q PAIN PUMP (FOR ORDER SET NO CHG)
INJECTION | Status: DC
  Filled 2012-02-20: qty 1

## 2012-02-20 MED ORDER — WARFARIN - PHARMACIST DOSING INPATIENT: Freq: Every day | Status: DC

## 2012-02-20 MED ORDER — FENTANYL CITRATE 0.05 MG/ML IJ SOLN
INTRAMUSCULAR | Status: DC | PRN
  Administered 2012-02-20: 100 ug via INTRAVENOUS

## 2012-02-20 MED ORDER — FLEET ENEMA 7-19 GM/118ML RE ENEM: 1.0000 | ENEMA | Freq: Once | RECTAL | Status: AC | PRN

## 2012-02-20 MED ORDER — ONDANSETRON HCL 4 MG/2ML IJ SOLN
4.0000 mg | Freq: Four times a day (QID) | INTRAMUSCULAR | Status: DC | PRN
  Administered 2012-02-20: 4 mg via INTRAVENOUS
  Filled 2012-02-20: qty 2

## 2012-02-20 MED ORDER — DEXAMETHASONE 6 MG PO TABS
10.0000 mg | ORAL_TABLET | Freq: Every day | ORAL | Status: AC
  Administered 2012-02-21: 10 mg via ORAL
  Filled 2012-02-20: qty 1

## 2012-02-20 MED ORDER — BISACODYL 10 MG RE SUPP: 10.0000 mg | Freq: Every day | RECTAL | Status: DC | PRN

## 2012-02-20 MED ORDER — METHOCARBAMOL 100 MG/ML IJ SOLN
500.0000 mg | Freq: Four times a day (QID) | INTRAVENOUS | Status: DC | PRN
  Administered 2012-02-20: 500 mg via INTRAVENOUS
  Filled 2012-02-20: qty 5

## 2012-02-20 MED ORDER — PANTOPRAZOLE SODIUM 40 MG PO TBEC
40.0000 mg | DELAYED_RELEASE_TABLET | Freq: Every day | ORAL | Status: DC
  Filled 2012-02-20: qty 1

## 2012-02-20 MED ORDER — INSULIN ASPART 100 UNIT/ML ~~LOC~~ SOLN: 0.0000 [IU] | Freq: Three times a day (TID) | SUBCUTANEOUS | Status: DC

## 2012-02-20 MED ORDER — METHOCARBAMOL 500 MG PO TABS
500.0000 mg | ORAL_TABLET | Freq: Four times a day (QID) | ORAL | Status: DC | PRN
  Administered 2012-02-21 – 2012-02-23 (×4): 500 mg via ORAL
  Filled 2012-02-20 (×4): qty 1

## 2012-02-20 MED ORDER — LACTATED RINGERS IV SOLN: INTRAVENOUS | Status: DC

## 2012-02-20 MED ORDER — OXYCODONE HCL 5 MG PO TABS
5.0000 mg | ORAL_TABLET | ORAL | Status: DC | PRN
Start: 2012-02-20 — End: 2012-02-23
  Administered 2012-02-20 – 2012-02-21 (×6): 10 mg via ORAL
  Administered 2012-02-22 – 2012-02-23 (×2): 5 mg via ORAL
  Filled 2012-02-20 (×3): qty 2
  Filled 2012-02-20: qty 1
  Filled 2012-02-20 (×2): qty 2
  Filled 2012-02-20: qty 1
  Filled 2012-02-20: qty 2

## 2012-02-20 MED ORDER — ONDANSETRON HCL 4 MG/2ML IJ SOLN
INTRAMUSCULAR | Status: DC | PRN
  Administered 2012-02-20: 4 mg via INTRAVENOUS

## 2012-02-20 MED ORDER — TAMSULOSIN HCL 0.4 MG PO CAPS
0.4000 mg | ORAL_CAPSULE | Freq: Every day | ORAL | Status: DC
  Administered 2012-02-21 – 2012-02-23 (×3): 0.4 mg via ORAL
  Filled 2012-02-20 (×4): qty 1

## 2012-02-20 MED ORDER — HYDROCHLOROTHIAZIDE 12.5 MG PO CAPS
12.5000 mg | ORAL_CAPSULE | Freq: Every morning | ORAL | Status: DC
  Administered 2012-02-21 – 2012-02-23 (×3): 12.5 mg via ORAL
  Filled 2012-02-20 (×3): qty 1

## 2012-02-20 MED ORDER — ACETAMINOPHEN 10 MG/ML IV SOLN: INTRAVENOUS | Status: DC | PRN

## 2012-02-20 MED ORDER — MORPHINE SULFATE 2 MG/ML IJ SOLN
1.0000 mg | INTRAMUSCULAR | Status: DC | PRN
  Administered 2012-02-20 (×5): 2 mg via INTRAVENOUS
  Filled 2012-02-20 (×6): qty 1

## 2012-02-20 MED ORDER — PROMETHAZINE HCL 25 MG/ML IJ SOLN: 6.2500 mg | INTRAMUSCULAR | Status: DC | PRN

## 2012-02-20 MED ORDER — SODIUM CHLORIDE 0.9 % IV SOLN
INTRAVENOUS | Status: DC
  Administered 2012-02-20 (×2): via INTRAVENOUS

## 2012-02-20 MED ORDER — BUPIVACAINE 0.25 % ON-Q PUMP SINGLE CATH 300ML
INJECTION | Status: DC | PRN
  Administered 2012-02-20: 300 mL

## 2012-02-20 MED ORDER — MIDAZOLAM HCL 5 MG/5ML IJ SOLN
INTRAMUSCULAR | Status: DC | PRN
  Administered 2012-02-20 (×2): 1 mg via INTRAVENOUS

## 2012-02-20 MED ORDER — METOCLOPRAMIDE HCL 10 MG PO TABS: 5.0000 mg | ORAL_TABLET | Freq: Three times a day (TID) | ORAL | Status: DC | PRN

## 2012-02-20 MED ORDER — HYDROMORPHONE HCL PF 1 MG/ML IJ SOLN: 0.2500 mg | INTRAMUSCULAR | Status: DC | PRN

## 2012-02-20 MED ORDER — ACETAMINOPHEN 325 MG PO TABS: 650.0000 mg | ORAL_TABLET | Freq: Four times a day (QID) | ORAL | Status: DC | PRN

## 2012-02-20 MED ORDER — ENOXAPARIN SODIUM 30 MG/0.3ML ~~LOC~~ SOLN
30.0000 mg | Freq: Two times a day (BID) | SUBCUTANEOUS | Status: DC
  Administered 2012-02-21 – 2012-02-23 (×5): 30 mg via SUBCUTANEOUS
  Filled 2012-02-20 (×8): qty 0.3

## 2012-02-20 MED ORDER — ACETAMINOPHEN 10 MG/ML IV SOLN
1000.0000 mg | Freq: Four times a day (QID) | INTRAVENOUS | Status: AC
  Administered 2012-02-20 – 2012-02-21 (×4): 1000 mg via INTRAVENOUS
  Filled 2012-02-20 (×6): qty 100

## 2012-02-20 MED ORDER — DEXAMETHASONE SODIUM PHOSPHATE 10 MG/ML IJ SOLN
10.0000 mg | Freq: Every day | INTRAMUSCULAR | Status: AC
  Filled 2012-02-20: qty 1

## 2012-02-20 MED ORDER — WARFARIN SODIUM 7.5 MG PO TABS
7.5000 mg | ORAL_TABLET | Freq: Once | ORAL | Status: AC
  Administered 2012-02-20: 7.5 mg via ORAL
  Filled 2012-02-20: qty 1

## 2012-02-20 MED ORDER — MEPERIDINE HCL 50 MG/ML IJ SOLN: 6.2500 mg | INTRAMUSCULAR | Status: DC | PRN

## 2012-02-20 MED ORDER — DUTASTERIDE 0.5 MG PO CAPS
0.5000 mg | ORAL_CAPSULE | Freq: Every day | ORAL | Status: DC
  Administered 2012-02-20 – 2012-02-22 (×3): 0.5 mg via ORAL
  Filled 2012-02-20 (×4): qty 1

## 2012-02-20 MED ORDER — MEMANTINE HCL 5 MG PO TABS
5.0000 mg | ORAL_TABLET | Freq: Two times a day (BID) | ORAL | Status: DC
  Administered 2012-02-20 – 2012-02-23 (×6): 5 mg via ORAL
  Filled 2012-02-20 (×8): qty 1

## 2012-02-20 MED ORDER — CHLORHEXIDINE GLUCONATE 4 % EX LIQD
60.0000 mL | Freq: Once | CUTANEOUS | Status: DC
  Filled 2012-02-20: qty 60

## 2012-02-20 MED ORDER — METOCLOPRAMIDE HCL 5 MG/ML IJ SOLN: 5.0000 mg | Freq: Three times a day (TID) | INTRAMUSCULAR | Status: DC | PRN

## 2012-02-20 MED ORDER — TRAMADOL HCL 50 MG PO TABS
50.0000 mg | ORAL_TABLET | Freq: Four times a day (QID) | ORAL | Status: DC | PRN
  Administered 2012-02-21 – 2012-02-22 (×3): 100 mg via ORAL
  Filled 2012-02-20 (×3): qty 2

## 2012-02-20 MED ORDER — ENALAPRIL MALEATE 20 MG PO TABS
20.0000 mg | ORAL_TABLET | Freq: Every morning | ORAL | Status: DC
  Administered 2012-02-21 – 2012-02-23 (×3): 20 mg via ORAL
  Filled 2012-02-20 (×4): qty 1

## 2012-02-20 MED ORDER — SODIUM CHLORIDE 0.9 % IV SOLN: INTRAVENOUS | Status: DC

## 2012-02-20 MED ORDER — MENTHOL 3 MG MT LOZG: 1.0000 | LOZENGE | OROMUCOSAL | Status: DC | PRN

## 2012-02-20 MED ORDER — SODIUM CHLORIDE 0.9 % IR SOLN
Status: DC | PRN
  Administered 2012-02-20: 1000 mL

## 2012-02-20 MED ORDER — EPHEDRINE SULFATE 50 MG/ML IJ SOLN
INTRAMUSCULAR | Status: DC | PRN
  Administered 2012-02-20 (×3): 5 mg via INTRAVENOUS

## 2012-02-20 MED ORDER — PHENOL 1.4 % MT LIQD: 1.0000 | OROMUCOSAL | Status: DC | PRN

## 2012-02-20 MED ORDER — CEFAZOLIN SODIUM-DEXTROSE 2-3 GM-% IV SOLR
2.0000 g | INTRAVENOUS | Status: AC
  Administered 2012-02-20: 2 g via INTRAVENOUS

## 2012-02-20 MED ORDER — ONDANSETRON HCL 4 MG PO TABS: 4.0000 mg | ORAL_TABLET | Freq: Four times a day (QID) | ORAL | Status: DC | PRN

## 2012-02-20 MED ORDER — BUPIVACAINE IN DEXTROSE 0.75-8.25 % IT SOLN
INTRATHECAL | Status: DC | PRN
  Administered 2012-02-20: 1.5 mL via INTRATHECAL

## 2012-02-20 MED ORDER — DONEPEZIL HCL 5 MG PO TABS
5.0000 mg | ORAL_TABLET | Freq: Every morning | ORAL | Status: DC
  Administered 2012-02-21 – 2012-02-23 (×3): 5 mg via ORAL
  Filled 2012-02-20 (×3): qty 1

## 2012-02-20 MED ORDER — DIPHENHYDRAMINE HCL 12.5 MG/5ML PO ELIX: 12.5000 mg | ORAL_SOLUTION | ORAL | Status: DC | PRN

## 2012-02-20 MED ORDER — PROPOFOL 10 MG/ML IV EMUL
INTRAVENOUS | Status: DC | PRN
  Administered 2012-02-20: 75 ug/kg/min via INTRAVENOUS

## 2012-02-20 MED ORDER — AMLODIPINE BESYLATE 10 MG PO TABS
10.0000 mg | ORAL_TABLET | Freq: Every morning | ORAL | Status: DC
  Administered 2012-02-21 – 2012-02-23 (×3): 10 mg via ORAL
  Filled 2012-02-20 (×3): qty 1

## 2012-02-20 MED ORDER — POLYETHYLENE GLYCOL 3350 17 G PO PACK: 17.0000 g | PACK | Freq: Every day | ORAL | Status: DC | PRN

## 2012-02-20 MED ORDER — SIMVASTATIN 20 MG PO TABS
20.0000 mg | ORAL_TABLET | Freq: Every day | ORAL | Status: DC
  Administered 2012-02-20 – 2012-02-21 (×2): 20 mg via ORAL
  Filled 2012-02-20 (×4): qty 1

## 2012-02-20 MED ORDER — ACETAMINOPHEN 650 MG RE SUPP: 650.0000 mg | Freq: Four times a day (QID) | RECTAL | Status: DC | PRN

## 2012-02-20 MED ORDER — 0.9 % SODIUM CHLORIDE (POUR BTL) OPTIME
TOPICAL | Status: DC | PRN
  Administered 2012-02-20: 1000 mL

## 2012-02-20 MED ORDER — LACTATED RINGERS IV SOLN
INTRAVENOUS | Status: DC | PRN
  Administered 2012-02-20 (×3): via INTRAVENOUS

## 2012-02-20 MED ORDER — METOPROLOL TARTRATE 50 MG PO TABS
50.0000 mg | ORAL_TABLET | Freq: Two times a day (BID) | ORAL | Status: DC
  Administered 2012-02-20 – 2012-02-23 (×6): 50 mg via ORAL
  Filled 2012-02-20 (×7): qty 1

## 2012-02-20 MED ORDER — ACETAMINOPHEN 10 MG/ML IV SOLN
1000.0000 mg | Freq: Once | INTRAVENOUS | Status: AC
  Administered 2012-02-20: 1000 mg via INTRAVENOUS

## 2012-02-20 MED ORDER — DOCUSATE SODIUM 100 MG PO CAPS
100.0000 mg | ORAL_CAPSULE | Freq: Two times a day (BID) | ORAL | Status: DC
  Administered 2012-02-20 – 2012-02-23 (×6): 100 mg via ORAL

## 2012-02-20 SURGICAL SUPPLY — 52 items
BAG SPEC THK2 15X12 ZIP CLS (MISCELLANEOUS) ×1
BAG ZIPLOCK 12X15 (MISCELLANEOUS) ×2 IMPLANT
BANDAGE ELASTIC 6 VELCRO ST LF (GAUZE/BANDAGES/DRESSINGS) ×2 IMPLANT
BANDAGE ESMARK 6X9 LF (GAUZE/BANDAGES/DRESSINGS) ×1 IMPLANT
BLADE SAG 18X100X1.27 (BLADE) ×2 IMPLANT
BLADE SAW SGTL 11.0X1.19X90.0M (BLADE) ×2 IMPLANT
BNDG CMPR 9X6 STRL LF SNTH (GAUZE/BANDAGES/DRESSINGS) ×1
BNDG ESMARK 6X9 LF (GAUZE/BANDAGES/DRESSINGS) ×2
BOWL SMART MIX CTS (DISPOSABLE) ×2 IMPLANT
CATH KIT ON-Q SILVERSOAK 5 (CATHETERS) ×1 IMPLANT
CATH KIT ON-Q SILVERSOAK 5IN (CATHETERS) ×2 IMPLANT
CEMENT HV SMART SET (Cement) ×4 IMPLANT
CLOTH BEACON ORANGE TIMEOUT ST (SAFETY) ×2 IMPLANT
CUFF TOURN SGL QUICK 34 (TOURNIQUET CUFF) ×2
CUFF TRNQT CYL 34X4X40X1 (TOURNIQUET CUFF) ×1 IMPLANT
DRAPE EXTREMITY T 121X128X90 (DRAPE) ×2 IMPLANT
DRAPE POUCH INSTRU U-SHP 10X18 (DRAPES) ×2 IMPLANT
DRAPE U-SHAPE 47X51 STRL (DRAPES) ×2 IMPLANT
DRSG ADAPTIC 3X8 NADH LF (GAUZE/BANDAGES/DRESSINGS) ×2 IMPLANT
DRSG PAD ABDOMINAL 8X10 ST (GAUZE/BANDAGES/DRESSINGS) ×1 IMPLANT
DURAPREP 26ML APPLICATOR (WOUND CARE) ×2 IMPLANT
ELECT REM PT RETURN 9FT ADLT (ELECTROSURGICAL) ×2
ELECTRODE REM PT RTRN 9FT ADLT (ELECTROSURGICAL) ×1 IMPLANT
EVACUATOR 1/8 PVC DRAIN (DRAIN) ×2 IMPLANT
FACESHIELD LNG OPTICON STERILE (SAFETY) ×11 IMPLANT
GLOVE BIO SURGEON STRL SZ8 (GLOVE) ×2 IMPLANT
GLOVE BIOGEL PI IND STRL 8 (GLOVE) ×2 IMPLANT
GLOVE BIOGEL PI INDICATOR 8 (GLOVE) ×1
GLOVE SURG SS PI 6.5 STRL IVOR (GLOVE) ×4 IMPLANT
GOWN STRL NON-REIN LRG LVL3 (GOWN DISPOSABLE) ×4 IMPLANT
GOWN STRL REIN XL XLG (GOWN DISPOSABLE) ×2 IMPLANT
HANDPIECE INTERPULSE COAX TIP (DISPOSABLE) ×2
IMMOBILIZER KNEE 20 (SOFTGOODS) ×2
IMMOBILIZER KNEE 20 THIGH 36 (SOFTGOODS) ×1 IMPLANT
KIT BASIN OR (CUSTOM PROCEDURE TRAY) ×2 IMPLANT
MANIFOLD NEPTUNE II (INSTRUMENTS) ×2 IMPLANT
NS IRRIG 1000ML POUR BTL (IV SOLUTION) ×2 IMPLANT
PACK TOTAL JOINT (CUSTOM PROCEDURE TRAY) ×2 IMPLANT
PADDING CAST COTTON 6X4 STRL (CAST SUPPLIES) ×6 IMPLANT
POSITIONER SURGICAL ARM (MISCELLANEOUS) ×2 IMPLANT
SET HNDPC FAN SPRY TIP SCT (DISPOSABLE) ×1 IMPLANT
SPONGE GAUZE 4X4 12PLY (GAUZE/BANDAGES/DRESSINGS) ×2 IMPLANT
STRIP CLOSURE SKIN 1/2X4 (GAUZE/BANDAGES/DRESSINGS) ×4 IMPLANT
SUCTION FRAZIER 12FR DISP (SUCTIONS) ×2 IMPLANT
SUT MNCRL AB 4-0 PS2 18 (SUTURE) ×2 IMPLANT
SUT VIC AB 2-0 CT1 27 (SUTURE) ×6
SUT VIC AB 2-0 CT1 TAPERPNT 27 (SUTURE) ×3 IMPLANT
SUT VLOC 180 0 24IN GS25 (SUTURE) ×2 IMPLANT
TOWEL OR 17X26 10 PK STRL BLUE (TOWEL DISPOSABLE) ×4 IMPLANT
TRAY FOLEY CATH 14FRSI W/METER (CATHETERS) ×2 IMPLANT
WATER STERILE IRR 1500ML POUR (IV SOLUTION) ×3 IMPLANT
WRAP KNEE MAXI GEL POST OP (GAUZE/BANDAGES/DRESSINGS) ×3 IMPLANT

## 2012-02-20 NOTE — H&P (View-Only) (Signed)
Juan Li  DOB: 05/04/1931 Married / Language: English / Race: White Male  Date of Admission:  02/20/12  Chief Complaint:  Right Knee Pain  History of Present Illness The patient is a 78 year old male who comes in for a preoperative History and Physical. The patient is scheduled for a right total knee arthroplasty to be performed by Dr. Frank V. [name], MD at [facility] on 02/20/2012. The patient is a 78 year old male who is being followed for their right knee pain and osteoarthritis. Symptoms reported today include: pain and swelling. The patient feels that they are doing poorly and report their pain level to be moderate to severe. The following medication has been used for pain control: Hydrocodone. Juan Li comes in for evaluation of his right knee. He has been having more and more discomfort in his knee and got a cortisone shot prior to his surgery. He has not had any significant new trauma, but did twist his knee a little bit and it seemed to be bothering him a little more since. No new catching, locking, or giving way. He has tried the gel shots and they helped very little. He is now ready to procedd with knee replacement. They have been treated conservatively in the past for the above stated problem and despite conservative measures, they continue to have progressive pain and severe functional limitations and dysfunction. They have failed non-operative management including home exercise, medications, and injections. It is felt that they would benefit from undergoing total joint replacement. Risks and benefits of the procedure have been discussed with the patient and they elect to proceed with surgery. There are no active contraindications to surgery such as ongoing infection or rapidly progressive neurological disease.   Problem List/Past Medical Osteoarthritis, Hip (715.35)  Allergies No Known Drug Allergies   Family History Hypertension. mother and  father   Social History Pain Contract. no Marital status. married Illicit drug use. no Children. 2 Exercise. Exercises weekly; does other Number of flights of stairs before winded. greater than 5 Living situation. live with spouse Tobacco / smoke exposure. no Current work status. retired Alcohol use. current drinker; drinks beer; only occasionally per week Drug/Alcohol Rehab (Previously). no Tobacco use. former smoker; smoke(d) 1 pack(s) per day Drug/Alcohol Rehab (Currently). no   Medication History AmLODIPine Besylate (5MG Tablet, Oral) Active. Aspirin EC (81MG Tablet DR, Oral) Active. Avodart (0.5MG Capsule, Oral) Active. Co Q10 (200MG Capsule, Oral) Active. Cyclobenzaprine HCl (5MG Tablet, Oral) Active. Donepezil HCl (5MG Tablet, Oral) Active. Enalapril Maleate (20MG Tablet, Oral) Active. Hydrochlorothiazide (12.5MG Capsule, Oral) Active. Hydrocodone-Acetaminophen (10-500MG Tablet, Oral) Active. Metoprolol Tartrate (50MG Tablet, Oral) Active. Namenda (10MG Tablet, Oral) Active. Omeprazole (20MG Capsule DR, Oral) Active. Pravastatin Sodium (40MG Tablet, Oral) Active. Tamsulosin HCl (0.4MG Capsule, Oral) Active. Warfarin Sodium (5MG Tablet, Oral) Active.   Past Surgical History Arthroscopy of Knee. right Rotator Cuff Repair. left Carpal Tunnel Repair. left Prostatectomy; Transurethral Aortic Valve Replacement   Medical History Prostate Disease. BPH Rheumatoid Arthritis High blood pressure Coronary artery disease Valvular heart disease Hypercholesterolemia Chronic kidney disease Dementia. Mild Carpal Tunnel Syndrome Compression Cervical Myelopathy Gouty Arthropathy Obesity Gastroesophageal Reflux Achilles Tendonitis Aortic Stenosis Chronic Gait Disturbance  Review of Systems General:Not Present- Chills, Fever, Night Sweats, Fatigue, Weight Gain, Weight Loss and Memory Loss. Skin:Not Present- Hives, Itching, Rash, Eczema and  Lesions. HEENT:Not Present- Tinnitus, Headache, Double Vision, Visual Loss, Hearing Loss and Dentures. Respiratory:Not Present- Shortness of breath with exertion, Shortness of breath at rest, Allergies,   Coughing up blood and Chronic Cough. Cardiovascular:Not Present- Chest Pain, Racing/skipping heartbeats, Difficulty Breathing Lying Down, Murmur, Swelling and Palpitations. Gastrointestinal:Not Present- Bloody Stool, Heartburn, Abdominal Pain, Vomiting, Nausea, Constipation, Diarrhea, Difficulty Swallowing, Jaundice and Loss of appetitie. Male Genitourinary:Not Present- Urinary frequency, Blood in Urine, Weak urinary stream, Discharge, Flank Pain, Incontinence, Painful Urination, Urgency, Urinary Retention and Urinating at Night. Musculoskeletal:Present- Joint Pain. Not Present- Muscle Weakness, Muscle Pain, Joint Swelling, Back Pain, Morning Stiffness and Spasms. Neurological:Not Present- Tremor, Dizziness, Blackout spells, Paralysis, Difficulty with balance and Weakness. Psychiatric:Not Present- Insomnia.   Vitals Weight: 197 lb Height: 66 in Body Surface Area: 2.04 m Body Mass Index: 31.8 kg/m Pulse: 52 (Regular) Resp.: 12 (Unlabored) BP: 146/78 (Sitting, Right Arm, Standard)   Physical Exam The physical exam findings are as follows:  [name] is a 79 year old male with continued knee pain. Patient is accompanied today by his wife Juan Li.   General Mental Status - Alert, cooperative and good historian. General Appearance- pleasant. Not in acute distress. Orientation- Oriented X3. Build & Nutrition- Well nourished and Well developed.   Head and Neck Head- normocephalic, atraumatic . Neck Global Assessment- supple. no bruit auscultated on the right (he does have radiation of the loud mechanical click to the carotids bialterally) and no bruit auscultated on the left (he does have radiation of the loud mechanical click to the carotids  bialterally).   Eye Pupil- Bilateral- PERR Motion- Bilateral- EOMI. wears reading glasses  Chest and Lung Exam Auscultation: Breath sounds:- clear at anterior chest wall and - clear at posterior chest wall. Adventitious sounds:- No Adventitious sounds.   Cardiovascular Auscultation:Rhythm- Regular rate and rhythm. Heart Sounds- S1 WNL and S2 WNL (loud mechanical click). Murmurs & Other Heart Sounds:Auscultation of the heart reveals - No Murmurs.   Abdomen Inspection:Contour- Generalized mild distention. Palpation/Percussion:Tenderness- Abdomen is non-tender to palpation. Rigidity (guarding)- Abdomen is soft. Auscultation:Auscultation of the abdomen reveals - Bowel sounds normal.   Male Genitourinary Not done, not pertinent to present illness  Musculoskeletal He is alert and oriented in no apparent distress. Evaluation of his hip, flexion 100, rotation in 20, out 30, abduction 30 without discomfort. Right knee shows varus deformity. There is no effusion. There is marked crepitus on range of motion. He is tender medial greater than lateral with no instability.  RADIOGRAPHS: AP pelvis, lateral of the right hip do show moderate degenerative change but he is not fully bone on bone. AP both knees and lateral of the right show bone on bone change in the medial and patellofemoral compartments.  Assessment & Plan Osteoarthrosis NOS, lower leg (715.96) Impression: Right Knee  Note: Patient is for a right total knee replacement by Dr. [name].  Plan is to go to SNF.  The patient is taking coumadin and will stop 5 days prior to surgery. He will be given a Lovenox bridge preoperatively. He will be placed back on his coumadin postop along with a Lovenox bridge again.  PCP - Dr. Willam [name] - Patient has been seen preoperatively and felt to be stable for surgery.  He is at a risk for post op delirium due to his history of mild dementia.  He also has chronic  gait disturbance due to a compression cervical myelopathy with chronic leg weakness and numbness. Cardiology - Dr. Dalton McLean Urology - Dr. David Grapey  Signed electronically by DREW L PERKINS, PA-C 

## 2012-02-20 NOTE — Anesthesia Preprocedure Evaluation (Addendum)
Anesthesia Evaluation  Patient identified by MRN, date of birth, ID band Patient awake    Reviewed: Allergy & Precautions, H&P , NPO status , Patient's Chart, lab work & pertinent test results  History of Anesthesia Complications (+) PROLONGED EMERGENCE and Emergence Delirium  Airway Mallampati: II TM Distance: >3 FB Neck ROM: Full    Dental No notable dental hx. (+) Edentulous Upper and Upper Dentures   Pulmonary neg pulmonary ROS,  breath sounds clear to auscultation  Pulmonary exam normal       Cardiovascular hypertension, Pt. on medications + CAD negative cardio ROS  + Valvular Problems/Murmurs (s/p AVR) Rhythm:Regular Rate:Normal     Neuro/Psych Mild dementianegative neurological ROS  negative psych ROS   GI/Hepatic negative GI ROS, Neg liver ROS,   Endo/Other  negative endocrine ROS  Renal/GU Renal InsufficiencyRenal diseasenegative Renal ROS  negative genitourinary   Musculoskeletal negative musculoskeletal ROS (+)   Abdominal   Peds negative pediatric ROS (+)  Hematology negative hematology ROS (+)   Anesthesia Other Findings   Reproductive/Obstetrics negative OB ROS                          Anesthesia Physical Anesthesia Plan  ASA: III  Anesthesia Plan: General   Post-op Pain Management:    Induction: Intravenous  Airway Management Planned: Simple Face Mask  Additional Equipment:   Intra-op Plan:   Post-operative Plan: Extubation in OR  Informed Consent: I have reviewed the patients History and Physical, chart, labs and discussed the procedure including the risks, benefits and alternatives for the proposed anesthesia with the patient or authorized representative who has indicated his/her understanding and acceptance.   Dental advisory given  Plan Discussed with: CRNA  Anesthesia Plan Comments:         Anesthesia Quick Evaluation

## 2012-02-20 NOTE — Progress Notes (Signed)
Utilization review completed.  

## 2012-02-20 NOTE — Plan of Care (Signed)
Problem: Consults Goal: Diagnosis- Total Joint Replacement Right total knee     

## 2012-02-20 NOTE — Op Note (Signed)
Pre-operative diagnosis- Osteoarthritis  Right knee(s)  Post-operative diagnosis- Osteoarthritis Right knee(s)  Procedure-  Right  Total Knee Arthroplasty  Surgeon- [name]. Euan Wandler, MD  Assistant- Dimitri Ped, PA-C   Anesthesia-  Spinal EBL-* No blood loss amount entered *  Drains Hemovac  Tourniquet time-  Total Tourniquet Time Documented: Thigh (Right) - 35 minutes   Complications- None  Condition-PACU - hemodynamically stable.   Brief Clinical Note  Juan Li is a 78 y.o. year old male with end stage OA of his right knee with progressively worsening pain and dysfunction. He has constant pain, with activity and at rest and significant functional deficits with difficulties even with ADLs. He has had extensive non-op management including analgesics, injections of cortisone and viscosupplements, and home exercise program, but remains in significant pain with significant dysfunction. Radiographs show bone on bone arthritis medial and patellofemoral. He presents now for right Total Knee Arthroplasty.    Procedure in detail---   The patient is brought into the operating room and positioned supine on the operating table. After successful administration of  Spinal,   a tourniquet is placed high on the  Right thigh(s) and the lower extremity is prepped and draped in the usual sterile fashion. Time out is performed by the operating team and then the  Right lower extremity is wrapped in Esmarch, knee flexed and the tourniquet inflated to 300 mmHg.       A midline incision is made with a ten blade through the subcutaneous tissue to the level of the extensor mechanism. A fresh blade is used to make a medial parapatellar arthrotomy. Soft tissue over the proximal medial tibia is subperiosteally elevated to the joint line with a knife and into the semimembranosus bursa with a Cobb elevator. Soft tissue over the proximal lateral tibia is elevated with attention being paid to avoiding the  patellar tendon on the tibial tubercle. The patella is everted, knee flexed 90 degrees and the ACL and PCL are removed. Findings are bone on bone medial and patellofemoral with large medial osteophytes.        The drill is used to create a starting hole in the distal femur and the canal is thoroughly irrigated with sterile saline to remove the fatty contents. The 5 degree Right  valgus alignment guide is placed into the femoral canal and the distal femoral cutting block is pinned to remove 11 mm off the distal femur. Resection is made with an oscillating saw.      The tibia is subluxed forward and the menisci are removed. The extramedullary alignment guide is placed referencing proximally at the medial aspect of the tibial tubercle and distally along the second metatarsal axis and tibial crest. The block is pinned to remove 2mm off the more deficient medial  side. Resection is made with an oscillating saw. Size 3is the most appropriate size for the tibia and the proximal tibia is prepared with the modular drill and keel punch for that size.      The femoral sizing guide is placed and size 3 is most appropriate. Rotation is marked off the epicondylar axis and confirmed by creating a rectangular flexion gap at 90 degrees. The size 3 cutting block is pinned in this rotation and the anterior, posterior and chamfer cuts are made with the oscillating saw. The intercondylar block is then placed and that cut is made.      Trial size 3 tibial component, trial size 3 posterior stabilized femur and a 10  mm posterior stabilized rotating platform insert trial is placed. Full extension is achieved with excellent varus/valgus and anterior/posterior balance throughout full range of motion. The patella is everted and thickness measured to be 24  mm. Free hand resection is taken to 14 mm, a 38 template is placed, lug holes are drilled, trial patella is placed, and it tracks normally. Osteophytes are removed off the posterior  femur with the trial in place. All trials are removed and the cut bone surfaces prepared with pulsatile lavage. Cement is mixed and once ready for implantation, the size 3 tibial implant, size  3 posterior stabilized femoral component, and the size 38 patella are cemented in place and the patella is held with the clamp. The trial insert is placed and the knee held in full extension. All extruded cement is removed and once the cement is hard the permanent 10 mm posterior stabilized rotating platform insert is placed into the tibial tray.      The wound is copiously irrigated with saline solution and the extensor mechanism closed over a hemovac drain with #1 PDS suture. The tourniquet is released for a total tourniquet time of 35  minutes. Flexion against gravity is 140 degrees and the patella tracks normally. Subcutaneous tissue is closed with 2.0 vicryl and subcuticular with running 4.0 Monocryl. The catheter for the Marcaine pain pump is placed and the pump is initiated. The incision is cleaned and dried and steri-strips and a bulky sterile dressing are applied. The limb is placed into a knee immobilizer and the patient is awakened and transported to recovery in stable condition.      Please note that a surgical assistant was a medical necessity for this procedure in order to perform it in a safe and expeditious manner. Surgical assistant was necessary to retract the ligaments and vital neurovascular structures to prevent injury to them and also necessary for proper positioning of the limb to allow for anatomic placement of the prosthesis.   [name] [name], MD    02/20/2012, 8:12 AM

## 2012-02-20 NOTE — Interval H&P Note (Signed)
History and Physical Interval Note:  02/20/2012 6:48 AM  Juan Li  has presented today for surgery, with the diagnosis of Osteoarthritis of the Right Knee  The various methods of treatment have been discussed with the patient and family. After consideration of risks, benefits and other options for treatment, the patient has consented to  Procedure(s) (LRB) with comments: TOTAL KNEE ARTHROPLASTY (Right) as a surgical intervention .  The patient's history has been reviewed, patient examined, no change in status, stable for surgery.  I have reviewed the patient's chart and labs.  Questions were answered to the patient's satisfaction.     Loanne Drilling

## 2012-02-20 NOTE — Anesthesia Postprocedure Evaluation (Signed)
  Anesthesia Post-op Note  Patient: Juan Li  Procedure(s) Performed: Procedure(s) (LRB): TOTAL KNEE ARTHROPLASTY (Right)  Patient Location: PACU  Anesthesia Type: Spinal  Level of Consciousness: awake and alert   Airway and Oxygen Therapy: Patient Spontanous Breathing  Post-op Pain: mild  Post-op Assessment: Post-op Vital signs reviewed, Patient's Cardiovascular Status Stable, Respiratory Function Stable, Patent Airway and No signs of Nausea or vomiting  Post-op Vital Signs: stable  Complications: No apparent anesthesia complications

## 2012-02-20 NOTE — Transfer of Care (Signed)
Immediate Anesthesia Transfer of Care Note  Patient: Juan Li  Procedure(s) Performed: Procedure(s) (LRB) with comments: TOTAL KNEE ARTHROPLASTY (Right)  Patient Location: PACU  Anesthesia Type: Regional  Level of Consciousness: awake, alert  and oriented  Airway & Oxygen Therapy: Patient Spontanous Breathing and Patient connected to face mask oxygen  Post-op Assessment: Report given to PACU RN and Post -op Vital signs reviewed and stable  Post vital signs: Reviewed and stable  Complications: No apparent anesthesia complications

## 2012-02-20 NOTE — Progress Notes (Signed)
ANTICOAGULATION CONSULT NOTE - Initial Consult  Pharmacy Consult for Warfarin Indication: Mechanical Prosthetic Aortic Valve  No Known Allergies  Patient Measurements:   Vital Signs: Temp: 97.3 F (36.3 C) (10/21 1032) BP: 129/63 mmHg (10/21 1032) Pulse Rate: 51  (10/21 1015)  Labs:  Basename 02/20/12 0615  HGB --  HCT --  PLT --  APTT --  LABPROT 14.3  INR 1.13  HEPARINUNFRC --  CREATININE --  CKTOTAL --  CKMB --  TROPONINI --    The CrCl is unknown because both a height and weight (above a minimum accepted value) are required for this calculation.   Medical History: Past Medical History  Diagnosis Date  . Heart murmur   . Complication of anesthesia 02-15-12    very confused, and very slow to awaken after anesthesia  . Coronary artery disease 02-15-12    Heart valve replaced   . Hypercholesterolemia 02-15-12  . Hypertension   . Arthritis 02-15-12    osteoarthritis,right knee, pain right wrist.,    Medications:  Scheduled:    . acetaminophen  1,000 mg Intravenous Once  . acetaminophen  1,000 mg Intravenous Q6H  . amLODipine  10 mg Oral q morning - 10a  .  ceFAZolin (ANCEF) IV  2 g Intravenous 60 min Pre-Op  .  ceFAZolin (ANCEF) IV  2 g Intravenous Q6H  . dexamethasone  10 mg Oral Daily   Or  . dexamethasone  10 mg Intravenous Daily  . docusate sodium  100 mg Oral BID  . donepezil  5 mg Oral q morning - 10a  . dutasteride  0.5 mg Oral QHS  . enalapril  20 mg Oral q morning - 10a  . enoxaparin (LOVENOX) injection  30 mg Subcutaneous Q12H  . hydrochlorothiazide  12.5 mg Oral q morning - 10a  . insulin aspart  0-15 Units Subcutaneous TID WC  . memantine  5 mg Oral BID  . metoprolol  50 mg Oral BID  . pantoprazole  40 mg Oral Daily  . simvastatin  20 mg Oral q1800  . Tamsulosin HCl  0.4 mg Oral Daily  . DISCONTD: chlorhexidine  60 mL Topical Once   Infusions:    . sodium chloride 100 mL/hr at 02/20/12 0950  . bupivacaine ON-Q pain pump    .  DISCONTD: sodium chloride    . DISCONTD: lactated ringers    . DISCONTD: lactated ringers      Assessment:  78 y/o M on chronic warfarin for [employer] AVR; anticoagulation was interrupted for R TKA, which was performed this AM.    According to records from [facility] (02/08/12), patient's most recent warfarin maintenance dosage was 5mg  daily except 2.5mg  on Mondays and Fridays.  Last dose of warfarin taken 10/16.  Lovenox 80mg  SQ q12h was used for preoperative bridging, last dose AM of 10/20.  To resume warfarin today and prophylactic-dose Lovenox tomorrow per orders from orthopedics attending service.   Goal of Therapy:  INR 2.5 - 3.5 (per records from cardiology) Monitor platelets by anticoagulation protocol: Yes   Plan:  1. Warfarin 7.5mg  PO x1 tonight. 2. Lovenox 30mg  SQ q12h starting tomorrow AM (as per orders from ortho attending). 3. Follow PT/INR daily.  Hope Budds, PharmD, BCPS Pager: [phone] 02/20/2012,10:47 AM

## 2012-02-20 NOTE — Anesthesia Procedure Notes (Signed)
Spinal  Patient location during procedure: OR End time: 02/20/2012 7:18 AM Staffing CRNA/Resident: Enriqueta Shutter Performed by: resident/CRNA  Preanesthetic Checklist Completed: patient identified, site marked, surgical consent, pre-op evaluation, timeout performed, IV checked, risks and benefits discussed and monitors and equipment checked Spinal Block Patient position: sitting Prep: Betadine Patient monitoring: heart rate, continuous pulse ox and blood pressure Location: L3-4 Injection technique: single-shot Needle Needle type: Spinocan  Needle gauge: 22 G Needle length: 9 cm Assessment Sensory level: T6 Additional Notes Expiration date of kit checked and confirmed. Patient tolerated procedure well, without complications.

## 2012-02-21 ENCOUNTER — Encounter (HOSPITAL_COMMUNITY): Payer: Self-pay | Admitting: Orthopedic Surgery

## 2012-02-21 DIAGNOSIS — E871 Hypo-osmolality and hyponatremia: Secondary | ICD-10-CM | POA: Diagnosis not present

## 2012-02-21 DIAGNOSIS — D500 Iron deficiency anemia secondary to blood loss (chronic): Secondary | ICD-10-CM | POA: Diagnosis not present

## 2012-02-21 LAB — BASIC METABOLIC PANEL
BUN: 12 mg/dL (ref 6–23)
Calcium: 8.5 mg/dL (ref 8.4–10.5)
Chloride: 100 mEq/L (ref 96–112)
Creatinine, Ser: 1.14 mg/dL (ref 0.50–1.35)
GFR calc Af Amer: 69 mL/min — ABNORMAL LOW (ref >90)
GFR calc non Af Amer: 60 mL/min — ABNORMAL LOW (ref >90)

## 2012-02-21 LAB — CBC
HCT: 37.2 % — ABNORMAL LOW (ref 39.0–52.0)
MCHC: 34.7 g/dL (ref 30.0–36.0)
MCV: 91.9 fL (ref 78.0–100.0)
Platelets: 180 10*3/uL (ref 150–400)
RDW: 13.2 % (ref 11.5–15.5)
WBC: 12.1 10*3/uL — ABNORMAL HIGH (ref 4.0–10.5)

## 2012-02-21 LAB — PROTIME-INR: Prothrombin Time: 14.4 seconds (ref 11.6–15.2)

## 2012-02-21 MED ORDER — WARFARIN SODIUM 7.5 MG PO TABS
7.5000 mg | ORAL_TABLET | Freq: Once | ORAL | Status: AC
  Administered 2012-02-21: 7.5 mg via ORAL
  Filled 2012-02-21: qty 1

## 2012-02-21 MED ORDER — NON FORMULARY: 20.0000 mg | Freq: Every day | Status: DC

## 2012-02-21 MED ORDER — OMEPRAZOLE 20 MG PO CPDR
20.0000 mg | DELAYED_RELEASE_CAPSULE | Freq: Every day | ORAL | Status: DC
  Administered 2012-02-21 – 2012-02-23 (×3): 20 mg via ORAL
  Filled 2012-02-21 (×3): qty 1

## 2012-02-21 NOTE — Progress Notes (Signed)
OT Note:  Pt plans STSNF for rehab.  Will defer OT eval to that venue.  [place], OTR/L [phone] 02/21/2012

## 2012-02-21 NOTE — Progress Notes (Signed)
Clinical Social Work Department BRIEF PSYCHOSOCIAL ASSESSMENT 02/21/2012  Patient:  DEMETRI, GOSHERT     Account Number:  [id number]     Admit date:  02/20/2012  Clinical Social Worker:  Candie Chroman  Date/Time:  02/21/2012 12:07 PM  Referred by:  Physician  Date Referred:  02/21/2012 Referred for  SNF Placement   Other Referral:   Interview type:  Patient Other interview type:    PSYCHOSOCIAL DATA Living Status:  WIFE Admitted from facility:   Level of care:   Primary support name:  [place] Primary support relationship to patient:  SPOUSE Degree of support available:   supportive    CURRENT CONCERNS Current Concerns  Post-Acute Placement   Other Concerns:    SOCIAL WORK ASSESSMENT / PLAN Pt is a 78 yr old gentleman living at home prior to hospitalization. CSW met with pt / spouse to assist with d/c planning. Pt has made prior plans to have ST Rehab at [facility] following hospital d/c. [facility] has been contacted and d/c plan has been confirmed. CSW will continue to follow to assist with d/c planning needs.   Assessment/plan status:  Psychosocial Support/Ongoing Assessment of Needs Other assessment/ plan:   Information/referral to community resources:   None needed at this time.    PATIENT'S/FAMILY'S RESPONSE TO PLAN OF CARE: Pt is looking forward to feeling better and having rehab at [facility].   Cori Razor LCSW [phone]

## 2012-02-21 NOTE — Progress Notes (Signed)
ANTICOAGULATION CONSULT NOTE   Pharmacy Consult for Warfarin Indication: Mechanical Prosthetic Aortic Valve  No Known Allergies  Patient Measurements: Height: 5\' 6"  (167.6 cm) Weight: 197 lb (89.359 kg) IBW/kg (Calculated) : 63.8  Vital Signs: Temp: 98.4 F (36.9 C) (10/22 0528) Temp src: Oral (10/22 0528) BP: 156/76 mmHg (10/22 0528) Pulse Rate: 60  (10/22 0528)  Labs:  Basename 02/21/12 0425 02/20/12 0615  HGB 12.9* --  HCT 37.2* --  PLT 180 --  APTT -- --  LABPROT 14.4 14.3  INR 1.14 1.13  HEPARINUNFRC -- --  CREATININE 1.14 --  CKTOTAL -- --  CKMB -- --  TROPONINI -- --    Estimated Creatinine Clearance: 55.9 ml/min (by C-G formula based on Cr of 1.14).   Medical History: Past Medical History  Diagnosis Date  . Heart murmur   . Complication of anesthesia 02-15-12    very confused, and very slow to awaken after anesthesia  . Coronary artery disease 02-15-12    Heart valve replaced   . Hypercholesterolemia 02-15-12  . Hypertension   . Arthritis 02-15-12    osteoarthritis,right knee, pain right wrist.,    Medications:  Scheduled:     . acetaminophen  1,000 mg Intravenous Q6H  . amLODipine  10 mg Oral q morning - 10a  .  ceFAZolin (ANCEF) IV  2 g Intravenous Q6H  . dexamethasone  10 mg Oral Daily   Or  . dexamethasone  10 mg Intravenous Daily  . docusate sodium  100 mg Oral BID  . donepezil  5 mg Oral q morning - 10a  . dutasteride  0.5 mg Oral QHS  . enalapril  20 mg Oral q morning - 10a  . enoxaparin (LOVENOX) injection  30 mg Subcutaneous Q12H  . hydrochlorothiazide  12.5 mg Oral q morning - 10a  . memantine  5 mg Oral BID  . metoprolol  50 mg Oral BID  . pantoprazole  40 mg Oral Daily  . simvastatin  20 mg Oral q1800  . Tamsulosin HCl  0.4 mg Oral Daily  . warfarin  7.5 mg Oral ONCE-1800  . Warfarin - Pharmacist Dosing Inpatient   Does not apply q1800  . DISCONTD: chlorhexidine  60 mL Topical Once  . DISCONTD: insulin aspart  0-15 Units  Subcutaneous TID WC   Infusions:     . sodium chloride 100 mL/hr at 02/20/12 2206  . bupivacaine ON-Q pain pump    . DISCONTD: sodium chloride    . DISCONTD: lactated ringers    . DISCONTD: lactated ringers     Inpatient warfarin doses administered: 10/21 -  7.5mg   Assessment:  78 y/o M on chronic warfarin for St Jude's AVR; anticoagulation was interrupted for R TKA, which was performed 10/21.    According to records from [facility] (02/08/12), patient's most recent warfarin maintenance dosage was 5mg  daily except 2.5mg  on Mondays and Fridays.  Last dose of warfarin taken 10/16.  Lovenox 80mg  SQ q12h was used for preoperative bridging, last dose AM of 10/20.  Warfarin re-initiation in progress (resumed 10/21); beginning prophylactic-dose Lovenox today per orders from orthopedics attending service.   Goal of Therapy:  INR 2.5 - 3.5 (per records from cardiology) Monitor platelets by anticoagulation protocol: Yes   Plan:  1. Repeat warfarin 7.5mg  PO x1 tonight. 2. Begin Lovenox 30mg  SQ q12h starting this AM (as per orders from ortho attending). 3. Follow PT/INR daily.  Embry Huss, Ky Barban, PharmD, BCPS Pager: [phone] 02/21/2012,7:45 AM

## 2012-02-21 NOTE — Progress Notes (Signed)
Clinical Social Work Department CLINICAL SOCIAL WORK PLACEMENT NOTE 02/21/2012  Patient:  Juan Li, Juan Li  Account Number:  [id number] Admit date:  02/20/2012  Clinical Social Worker:  Cori Razor, LCSW  Date/time:  02/21/2012 12:48 PM  Clinical Social Work is seeking post-discharge placement for this patient at the following level of care:   SKILLED NURSING   (*CSW will update this form in Epic as items are completed)     Patient/family provided with [facility] Department of Clinical Social Work's list of facilities offering this level of care within the geographic area requested by the patient (or if unable, by the patient's family).    Patient/family informed of their freedom to choose among providers that offer the needed level of care, that participate in Medicare, Medicaid or managed care program needed by the patient, have an available bed and are willing to accept the patient.    Patient/family informed of [facility]' ownership interest in [facility], as well as of the fact that they are under no obligation to receive care at this facility.  PASARR submitted to EDS on 02/20/2012 PASARR number received from EDS on 02/20/2012  FL2 transmitted to all facilities in geographic area requested by pt/family on  02/21/2012 FL2 transmitted to all facilities within larger geographic area on   Patient informed that his/her managed care company has contracts with or will negotiate with  certain facilities, including the following:     Patient/family informed of bed offers received:  02/21/2012 Patient chooses bed at [facility] Physician recommends and patient chooses bed at    Patient to be transferred to [facility] on   Patient to be transferred to facility by   The following physician request were entered in Epic:   Additional Comments:  Cori Razor LCSW [phone]

## 2012-02-21 NOTE — Evaluation (Signed)
Physical Therapy Evaluation Patient Details Name: Juan Li MRN: [id number] DOB: 07/16/1931 Today's Date: 02/21/2012 Time: 7425-9563 PT Time Calculation (min): 32 min  PT Assessment / Plan / Recommendation Clinical Impression  Pt s/p R TKR presents with decreased R LE strength/ROM limiting functional mobility    PT Assessment  Patient needs continued PT services    Follow Up Recommendations  Post acute inpatient    Does the patient have the potential to tolerate intense rehabilitation   No, Recommend SNF  Barriers to Discharge Decreased caregiver support      Equipment Recommendations  None recommended by PT    Recommendations for Other Services OT consult   Frequency 7X/week    Precautions / Restrictions Precautions Precautions: Knee Required Braces or Orthoses: Knee Immobilizer - Right Knee Immobilizer - Right: Discontinue once straight leg raise with < 10 degree lag Restrictions Weight Bearing Restrictions: No Other Position/Activity Restrictions: WBAT   Pertinent Vitals/Pain It hurts a good bit - RN aware, pt premedicated, cold packs provided      Mobility  Bed Mobility Bed Mobility: Supine to Sit Supine to Sit: 3: Mod assist Details for Bed Mobility Assistance: cues for sequence and for use of UEs and L LE to self assist.  Assist for R LE and to scoot to EOB Transfers Transfers: Sit to Stand;Stand to Sit Sit to Stand: 1: +2 Total assist Sit to Stand: Patient Percentage: 70% Stand to Sit: 1: +2 Total assist Stand to Sit: Patient Percentage: 60% Details for Transfer Assistance: cues for use of UEs and for LE management Ambulation/Gait Ambulation/Gait Assistance: 1: +2 Total assist Ambulation/Gait: Patient Percentage: 70% Ambulation Distance (Feet): 7 Feet Assistive device: Rolling walker Ambulation/Gait Assistance Details: cues for posture, increased UE WB, sequence and position from RW Gait Pattern: Step-to pattern    Shoulder Instructions       Exercises Total Joint Exercises Ankle Circles/Pumps: Both;AROM;Supine;15 reps Quad Sets: AROM;10 reps;Both;Supine Heel Slides: AAROM;10 reps;Right;Supine Straight Leg Raises: AAROM;10 reps;Right;Supine   PT Diagnosis: Difficulty walking  PT Problem List: Decreased strength;Decreased range of motion;Decreased activity tolerance;Decreased mobility;Pain;Decreased knowledge of use of DME PT Treatment Interventions: DME instruction;Gait training;Stair training;Functional mobility training;Therapeutic activities;Therapeutic exercise;Patient/family education   PT Goals Acute Rehab PT Goals PT Goal Formulation: With patient Time For Goal Achievement: 02/27/12 Potential to Achieve Goals: Fair Pt will go Supine/Side to Sit: with supervision PT Goal: Supine/Side to Sit - Progress: Goal set today Pt will go Sit to Supine/Side: with supervision PT Goal: Sit to Supine/Side - Progress: Goal set today Pt will go Sit to Stand: with supervision PT Goal: Sit to Stand - Progress: Goal set today Pt will go Stand to Sit: with supervision PT Goal: Stand to Sit - Progress: Goal set today Pt will Ambulate: 51 - 150 feet;with supervision;with rolling walker PT Goal: Ambulate - Progress: Goal set today  Visit Information  Last PT Received On: 02/21/12 Assistance Needed: +2    Subjective Data  Subjective: I have to use the bathroom Patient Stated Goal: Resume previous lifestyle with decreased pain   Prior Functioning  Home Living Lives With: Spouse Home Adaptive Equipment: Walker - rolling Prior Function Level of Independence: Independent with assistive device(s) Able to Take Stairs?: Yes Communication Communication: No difficulties    Cognition  Overall Cognitive Status: Appears within functional limits for tasks assessed/performed Arousal/Alertness: Awake/alert Orientation Level: Appears intact for tasks assessed Behavior During Session: [facility] for tasks performed    Extremity/Trunk Assessment  Right Upper Extremity Assessment RUE ROM/Strength/Tone: [facility]  for tasks assessed Left Upper Extremity Assessment LUE ROM/Strength/Tone: [facility] for tasks assessed Right Lower Extremity Assessment RLE ROM/Strength/Tone: Deficits RLE ROM/Strength/Tone Deficits: 2+/5 quads with AAROM at knee -15 - 35 Left Lower Extremity Assessment LLE ROM/Strength/Tone: WFL for tasks assessed   Balance    End of Session PT - End of Session Equipment Utilized During Treatment: Right knee immobilizer Activity Tolerance: Patient tolerated treatment well Patient left: in chair;with call bell/phone within reach Nurse Communication: Mobility status  GP     Prathik Aman 02/21/2012, 12:34 PM

## 2012-02-21 NOTE — Progress Notes (Signed)
   Subjective: 1 Day Post-Op Procedure(s) (LRB): TOTAL KNEE ARTHROPLASTY (Right) Patient reports pain as mild.   Patient seen in rounds with Dr. Lequita Halt. Patient is well, and has had no acute complaints or problems We will start therapy today.  Plan is to go Skilled nursing facility after hospital stay.  Objective: Vital signs in last 24 hours: Temp:  [96.3 F (35.7 C)-99 F (37.2 C)] 98.4 F (36.9 C) (10/22 0528) Pulse Rate:  [50-69] 60  (10/22 0528) Resp:  [13-18] 18  (10/22 0528) BP: (103-165)/(46-76) 156/76 mmHg (10/22 0528) SpO2:  [93 %-100 %] 97 % (10/22 0528) Weight:  [89.359 kg (197 lb)] 89.359 kg (197 lb) (10/21 1030)  Intake/Output from previous day:  Intake/Output Summary (Last 24 hours) at 02/21/12 0813 Last data filed at 02/21/12 0455  Gross per 24 hour  Intake 4773.33 ml  Output   3520 ml  Net 1253.33 ml    Intake/Output this shift: UOP 1250  Labs:  Basename 02/21/12 0425  HGB 12.9*    Basename 02/21/12 0425  WBC 12.1*  RBC 4.05*  HCT 37.2*  PLT 180    Basename 02/21/12 0425  NA 133*  K 4.4  CL 100  CO2 26  BUN 12  CREATININE 1.14  GLUCOSE 127*  CALCIUM 8.5    Basename 02/21/12 0425 02/20/12 0615  LABPT -- --  INR 1.14 1.13    EXAM General - Patient is Alert and Appropriate Extremity - Neurovascular intact Sensation intact distally Dorsiflexion/Plantar flexion intact Dressing - dressing C/D/I Motor Function - intact, moving foot and toes well on exam.  Hemovac pulled without difficulty.  Past Medical History  Diagnosis Date  . Heart murmur   . Complication of anesthesia 02-15-12    very confused, and very slow to awaken after anesthesia  . Coronary artery disease 02-15-12    Heart valve replaced   . Hypercholesterolemia 02-15-12  . Hypertension   . Arthritis 02-15-12    osteoarthritis,right knee, pain right wrist.,    Assessment/Plan: 1 Day Post-Op Procedure(s) (LRB): TOTAL KNEE ARTHROPLASTY (Right) Principal  Problem:  *OA (osteoarthritis) of knee Active Problems:  Postop Hyponatremia  Expected Postop Blood loss anemia  Estimated Body mass index is 31.80 kg/(m^2) as calculated from the following:   Height as of this encounter: 5\' 6" (1.676 m).   Weight as of this encounter: 197 lb(89.359 kg). Advance diet Up with therapy Discharge to SNF He is at a risk for post op delirium due to his history of mild dementia. 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 Monitor for changes. DVT Prophylaxis - Lovenox and Coumadin Continue Lovenox injections until the INR is therapeutic at or greater than 2.0.  When INR reaches the therapeutic level of equal to or greater than 2.0, the patient may discontinue the Lovenox injections. He was given a Lovenox bridge preoperatively. He was placed back on his coumadin postop along with a Lovenox bridge again. Weight-Bearing as tolerated to right leg No vaccines. D/C O2 and Pulse OX and try on Room [address]  Patrica Duel 02/21/2012, 8:13 AM

## 2012-02-21 NOTE — Progress Notes (Signed)
Physical Therapy Treatment Patient Details Name: Juan Li MRN: [id number] DOB: [birth year] Today's Date: 02/21/2012 Time: 4098-1191 PT Time Calculation (min): 29 min  PT Assessment / Plan / Recommendation Comments on Treatment Session  Pt attempted use of urinal in standing with no results - nursing aware    Follow Up Recommendations  Post acute inpatient     Does the patient have the potential to tolerate intense rehabilitation  No, Recommend SNF  Barriers to Discharge        Equipment Recommendations  None recommended by PT    Recommendations for Other Services OT consult  Frequency 7X/week   Plan      Precautions / Restrictions Precautions Precautions: Knee Required Braces or Orthoses: Knee Immobilizer - Right Knee Immobilizer - Right: Discontinue once straight leg raise with < 10 degree lag Restrictions Weight Bearing Restrictions: No Other Position/Activity Restrictions: WBAT   Pertinent Vitals/Pain     Mobility  Bed Mobility Bed Mobility: Sit to Supine Sit to Supine: 1: +2 Total assist Sit to Supine: Patient Percentage: 60% Details for Bed Mobility Assistance: cues for sequence and for use of UEs and L LE to self assist.  Assist for R LE and trunk.  MOd assist to roll side to side for hygiene and positioning Transfers Transfers: Sit to Stand;Stand to Sit Sit to Stand: 1: +2 Total assist;From chair/3-in-1 Sit to Stand: Patient Percentage: 60% Stand to Sit: 1: +2 Total assist;To bed Stand to Sit: Patient Percentage: 60% Details for Transfer Assistance: cues for use of UEs and for LE management Ambulation/Gait Ambulation/Gait Assistance: 1: +2 Total assist Ambulation/Gait: Patient Percentage: 60% Ambulation Distance (Feet): 15 Feet Assistive device: Rolling walker Ambulation/Gait Assistance Details: cues for posture, sequence, position from RW, stride length Gait Pattern: Step-to pattern    Exercises     PT Diagnosis:    PT Problem List:   PT  Treatment Interventions:     PT Goals Acute Rehab PT Goals PT Goal Formulation: With patient Time For Goal Achievement: 02/27/12 Potential to Achieve Goals: Fair Pt will go Supine/Side to Sit: with supervision PT Goal: Supine/Side to Sit - Progress: Goal set today Pt will go Sit to Supine/Side: with supervision PT Goal: Sit to Supine/Side - Progress: Goal set today Pt will go Sit to Stand: with supervision PT Goal: Sit to Stand - Progress: Goal set today Pt will go Stand to Sit: with supervision PT Goal: Stand to Sit - Progress: Goal set today Pt will Ambulate: 51 - 150 feet;with supervision;with rolling walker PT Goal: Ambulate - Progress: Goal set today  Visit Information  Last PT Received On: 02/21/12 Assistance Needed: +2    Subjective Data  Subjective: I have to use the bathroom Patient Stated Goal: Resume previous lifestyle with decreased pain   Cognition  Overall Cognitive Status: Appears within functional limits for tasks assessed/performed Arousal/Alertness: Awake/alert Orientation Level: Appears intact for tasks assessed Behavior During Session: [facility] for tasks performed    Balance     End of Session PT - End of Session Equipment Utilized During Treatment: Right knee immobilizer Activity Tolerance: Patient tolerated treatment well Patient left: with call bell/phone within reach;in bed Nurse Communication: Mobility status   GP     Satara Virella 02/21/2012, 3:31 PM

## 2012-02-22 LAB — BASIC METABOLIC PANEL
CO2: 25 mEq/L (ref 19–32)
Calcium: 9.7 mg/dL (ref 8.4–10.5)
Creatinine, Ser: 1.1 mg/dL (ref 0.50–1.35)
GFR calc Af Amer: 72 mL/min — ABNORMAL LOW (ref >90)
GFR calc non Af Amer: 62 mL/min — ABNORMAL LOW (ref >90)
Sodium: 133 mEq/L — ABNORMAL LOW (ref 135–145)

## 2012-02-22 LAB — CBC
MCH: 31.9 pg (ref 26.0–34.0)
MCHC: 35.2 g/dL (ref 30.0–36.0)
MCV: 90.5 fL (ref 78.0–100.0)
Platelets: 201 10*3/uL (ref 150–400)
RBC: 4.23 MIL/uL (ref 4.22–5.81)
RDW: 13.2 % (ref 11.5–15.5)

## 2012-02-22 LAB — PROTIME-INR
INR: 1.61 — ABNORMAL HIGH (ref 0.00–1.49)
Prothrombin Time: 18.6 seconds — ABNORMAL HIGH (ref 11.6–15.2)

## 2012-02-22 MED ORDER — WARFARIN SODIUM 5 MG PO TABS
5.0000 mg | ORAL_TABLET | Freq: Once | ORAL | Status: AC
  Administered 2012-02-22: 5 mg via ORAL
  Filled 2012-02-22: qty 1

## 2012-02-22 NOTE — Progress Notes (Signed)
   Subjective: 2 Days Post-Op Procedure(s) (LRB): TOTAL KNEE ARTHROPLASTY (Right) Patient reports pain as mild.   Patient seen in rounds for Dr. Lequita Halt.  He was confused to his location hi location this morning upon waking up at beginning of the visit but reoriented quickly and knew person, time and events.  Appropriated for the visit.  Doing fairly well and day two. Patient is well, and has had no acute complaints or problems Plan is to go Skilled nursing facility after hospital stay.  Objective: Vital signs in last 24 hours: Temp:  [97.3 F (36.3 C)-98.4 F (36.9 C)] 98.4 F (36.9 C) (10/23 0449) Pulse Rate:  [57-72] 69  (10/23 0449) Resp:  [16-18] 16  (10/23 0449) BP: (138-172)/(52-79) 172/58 mmHg (10/23 0449) SpO2:  [93 %-99 %] 99 % (10/23 0449)  Intake/Output from previous day:  Intake/Output Summary (Last 24 hours) at 02/22/12 0850 Last data filed at 02/21/12 1832  Gross per 24 hour  Intake  400.5 ml  Output    625 ml  Net -224.5 ml     Labs:  Basename 02/22/12 0420 02/21/12 0425  HGB 13.5 12.9*    Basename 02/22/12 0420 02/21/12 0425  WBC 17.7* 12.1*  RBC 4.23 4.05*  HCT 38.3* 37.2*  PLT 201 180    Basename 02/22/12 0420 02/21/12 0425  NA 133* 133*  K 4.0 4.4  CL 97 100  CO2 25 26  BUN 16 12  CREATININE 1.10 1.14  GLUCOSE 131* 127*  CALCIUM 9.7 8.5    Basename 02/22/12 0420 02/21/12 0425  LABPT -- --  INR 1.61* 1.14    EXAM General - Patient is Alert, Appropriate and ReOriented to place immediately. Extremity - Neurovascular intact Sensation intact distally Dorsiflexion/Plantar flexion intact No cellulitis present Dressing/Incision - clean, dry, no drainage, healing Motor Function - intact, moving foot and toes well on exam.   Past Medical History  Diagnosis Date  . Heart murmur   . Complication of anesthesia 02-15-12    very confused, and very slow to awaken after anesthesia  . Coronary artery disease 02-15-12    Heart valve replaced    . Hypercholesterolemia 02-15-12  . Hypertension   . Arthritis 02-15-12    osteoarthritis,right knee, pain right wrist.,    Assessment/Plan: 2 Days Post-Op Procedure(s) (LRB): TOTAL KNEE ARTHROPLASTY (Right) Principal Problem:  *OA (osteoarthritis) of knee Active Problems:  Postop Hyponatremia  Expected Postop Blood loss anemia  Estimated Body mass index is 31.80 kg/(m^2) as calculated from the following:   Height as of this encounter: 5\' 6" (1.676 m).   Weight as of this encounter: 197 lb(89.359 kg). Up with therapy Plan for discharge tomorrow Discharge to SNF He is at a risk for post op delirium due to his history of mild dementia. 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  Monitor for changes. DVT Prophylaxis - Lovenox and Coumadin - INR is 1.61 Continue Lovenox injections until the INR is therapeutic at or greater than 2.0. When INR reaches the therapeutic level of equal to or greater than 2.0, the patient may discontinue the Lovenox injections.  He was given a Lovenox bridge preoperatively. He was placed back on his coumadin postop along with a Lovenox bridge again. Weight-Bearing as tolerated to right leg  Continue with therapy today and likely transfer over to SNF tomorrow.  Philopater Mucha 02/22/2012, 8:50 AM

## 2012-02-22 NOTE — Progress Notes (Signed)
ANTICOAGULATION CONSULT NOTE   Pharmacy Consult for Warfarin Indication: Mechanical Prosthetic Aortic Valve  No Known Allergies  Patient Measurements: Height: 5\' 6"  (167.6 cm) Weight: 197 lb (89.359 kg) IBW/kg (Calculated) : 63.8  Vital Signs: Temp: 98.4 F (36.9 C) (10/23 0449) BP: 172/58 mmHg (10/23 0449) Pulse Rate: 69  (10/23 0449)  Labs:  Basename 02/22/12 0420 02/21/12 0425 02/20/12 0615  HGB 13.5 12.9* --  HCT 38.3* 37.2* --  PLT 201 180 --  APTT -- -- --  LABPROT 18.6* 14.4 14.3  INR 1.61* 1.14 1.13  HEPARINUNFRC -- -- --  CREATININE 1.10 1.14 --  CKTOTAL -- -- --  CKMB -- -- --  TROPONINI -- -- --    Estimated Creatinine Clearance: 57.9 ml/min (by C-G formula based on Cr of 1.1).   Medical History: Past Medical History  Diagnosis Date  . Heart murmur   . Complication of anesthesia 02-15-12    very confused, and very slow to awaken after anesthesia  . Coronary artery disease 02-15-12    Heart valve replaced   . Hypercholesterolemia 02-15-12  . Hypertension   . Arthritis 02-15-12    osteoarthritis,right knee, pain right wrist.,    Medications:  Scheduled:     . amLODipine  10 mg Oral q morning - 10a  . docusate sodium  100 mg Oral BID  . donepezil  5 mg Oral q morning - 10a  . dutasteride  0.5 mg Oral QHS  . enalapril  20 mg Oral q morning - 10a  . enoxaparin (LOVENOX) injection  30 mg Subcutaneous Q12H  . hydrochlorothiazide  12.5 mg Oral q morning - 10a  . memantine  5 mg Oral BID  . metoprolol  50 mg Oral BID  . omeprazole  20 mg Oral Daily  . simvastatin  20 mg Oral q1800  . Tamsulosin HCl  0.4 mg Oral Daily  . warfarin  7.5 mg Oral ONCE-1800  . Warfarin - Pharmacist Dosing Inpatient   Does not apply q1800   Infusions:     . DISCONTD: sodium chloride Stopped (02/21/12 1610)   Inpatient warfarin doses administered: 10/21 -  7.5mg  10/22 - 7.5mg   Assessment:  78 y/o M on chronic warfarin for St Jude's AVR; anticoagulation was  interrupted for R TKA, which was performed 10/21.    According to records from [facility] (02/08/12), patient's most recent warfarin maintenance dosage was 5mg  daily except 2.5mg  on Mondays and Fridays.  Last dose of warfarin taken 10/16.  Lovenox 80mg  SQ q12h was used for preoperative bridging, last dose AM of 10/20.  Warfarin re-initiation in progress (resumed 10/21); beginning prophylactic-dose Lovenox today per orders from orthopedics attending service.  INR subtherapeutic still as expected but now rising   Goal of Therapy:  INR 2.5 - 3.5 (per records from cardiology) Monitor platelets by anticoagulation protocol: Yes   Plan:  1. Warfarin 5mg  PO x1 tonight. 2. Continue Lovenox 30mg  SQ q12h (as per orders from ortho attending). 3. Follow PT/INR daily.   Hessie Knows, PharmD, BCPS Pager [phone] 02/22/2012 12:49 PM

## 2012-02-22 NOTE — Progress Notes (Signed)
Physical Therapy Treatment Patient Details Name: Juan Li MRN: [id number] DOB: 1932/03/23 Today's Date: 02/22/2012 Time: 1345-1410 PT Time Calculation (min): 25 min  PT Assessment / Plan / Recommendation Comments on Treatment Session  POD #2 pm session.  Amb in hallway then assisted back to bed for TKR TE's and CPM.    Follow Up Recommendations   (skilled nursing)     Does the patient have the potential to tolerate intense rehabilitation     Barriers to Discharge        Equipment Recommendations       Recommendations for Other Services    Frequency 7X/week   Plan Discharge plan remains appropriate    Precautions / Restrictions Precautions Precautions: Knee Precaution Comments: Instructed pt on KI use for amb Required Braces or Orthoses: Knee Immobilizer - Right Knee Immobilizer - Right: Discontinue once straight leg raise with < 10 degree lag Restrictions Weight Bearing Restrictions: No Other Position/Activity Restrictions: Pt instructed on WBAT    Pertinent Vitals/Pain C/o 8/10 R knee pain meds requested ICE applied    Mobility  Bed Mobility Bed Mobility: Sit to Supine Sit to Supine: 2: Max assist Details for Bed Mobility Assistance: Assisted back to bed for CPM  Transfers Transfers: Sit to Stand;Stand to Sit Sit to Stand: 2: Max assist;From chair/3-in-1 Stand to Sit: 2: Max assist;To bed Details for Transfer Assistance: 75% VC's on proper tech and hand placement. 100% VC's to complete turns prior to reaching for chair and sitting.  Ambulation/Gait Ambulation/Gait Assistance: 1: +2 Total assist Ambulation/Gait: Patient Percentage: 70% Ambulation Distance (Feet): 30 Feet Assistive device: Rolling walker Ambulation/Gait Assistance Details: 50% VC's on proper walker to self distance and upright posture.  Gait Pattern: Step-to pattern;Decreased stance time - right;Decreased stride length;Trunk flexed Gait velocity: decreased    Exercises Total Joint  Exercises Ankle Circles/Pumps: AROM;Both;10 reps;Supine Quad Sets: AROM;Both;10 reps;Supine Gluteal Sets: AROM;Both;10 reps;Supine Towel Squeeze: AROM;Both;10 reps;Supine Heel Slides: AAROM;Right;10 reps;Supine Hip ABduction/ADduction: AAROM;Right;10 reps;Supine Straight Leg Raises: AAROM;Right;10 reps;Supine    PT Goals   progressing    Visit Information  Last PT Received On: 02/22/12 Assistance Needed: +2 (for gait safety)    Subjective Data      Cognition       Balance     End of Session PT - End of Session Equipment Utilized During Treatment: Gait belt;Right knee immobilizer Activity Tolerance: Patient limited by fatigue;Patient limited by pain Patient left: in bed;with call bell/phone within reach;with family/visitor present Nurse Communication:  (pain meds)  Felecia Shelling  PTA [facility]  Acute  Rehab Pager     [phone]

## 2012-02-22 NOTE — Progress Notes (Signed)
Physical Therapy Treatment Patient Details Name: Juan Li MRN: [id number] DOB: 1931-11-26 Today's Date: 02/22/2012 Time: 1191-4782 PT Time Calculation (min): 26 min  PT Assessment / Plan / Recommendation Comments on Treatment Session  POD #2 am session R TKR.  Assisted pt from recliner to BR then amb back to recliner.  Very unsteady gait, ataxic and MAX VC's on sequencing.  Pt plans to D/C to SNF.    Follow Up Recommendations   (skilled nursing)     Does the patient have the potential to tolerate intense rehabilitation     Barriers to Discharge        Equipment Recommendations       Recommendations for Other Services    Frequency 7X/week   Plan Discharge plan remains appropriate    Precautions / Restrictions Precautions Precautions: Knee Precaution Comments: Instructed pt on KI use for amb Required Braces or Orthoses: Knee Immobilizer - Right Knee Immobilizer - Right: Discontinue once straight leg raise with < 10 degree lag Restrictions Weight Bearing Restrictions: No Other Position/Activity Restrictions: Pt instructed on WBAT   Pertinent Vitals/Pain C/o 8/10 R knee pain    Mobility  Bed Mobility Bed Mobility: Not assessed Details for Bed Mobility Assistance: Pt OOB in recliner  Transfers Transfers: Sit to Stand;Stand to Sit Sit to Stand: 2: Max assist;From chair/3-in-1;From toilet Stand to Sit: 2: Max assist;To chair/3-in-1;To toilet Details for Transfer Assistance: 75% VC's on proper tech and hand placement.  100% VC's to complete turns prior to reaching for chair and sitting.  Ambulation/Gait Ambulation/Gait Assistance: 2: Max assist Ambulation Distance (Feet): 24 Feet (12' x 2 to and from BR) Assistive device: Rolling walker Ambulation/Gait Assistance Details: 50% VC's on proper walker to self distance and upright posture. Gait Pattern: Step-to pattern;Decreased stance time - right;Trunk flexed Gait velocity: decreased    PT Goals progressing  slowly    Visit Information  Last PT Received On: 02/22/12 Assistance Needed: +2 (safety)    Subjective Data      Cognition       Balance     End of Session PT - End of Session Equipment Utilized During Treatment: Gait belt;Right knee immobilizer Activity Tolerance: Patient limited by fatigue;Patient limited by pain Patient left: in chair;with call bell/phone within reach Nurse Communication: Mobility status   Felecia Shelling  PTA [facility]  Acute  Rehab Pager     [phone]

## 2012-02-23 DIAGNOSIS — I10 Essential (primary) hypertension: Secondary | ICD-10-CM | POA: Diagnosis not present

## 2012-02-23 DIAGNOSIS — F0390 Unspecified dementia without behavioral disturbance: Secondary | ICD-10-CM | POA: Diagnosis not present

## 2012-02-23 DIAGNOSIS — D531 Other megaloblastic anemias, not elsewhere classified: Secondary | ICD-10-CM | POA: Diagnosis not present

## 2012-02-23 DIAGNOSIS — D62 Acute posthemorrhagic anemia: Secondary | ICD-10-CM | POA: Diagnosis not present

## 2012-02-23 DIAGNOSIS — M1990 Unspecified osteoarthritis, unspecified site: Secondary | ICD-10-CM | POA: Diagnosis not present

## 2012-02-23 DIAGNOSIS — Z954 Presence of other heart-valve replacement: Secondary | ICD-10-CM | POA: Diagnosis not present

## 2012-02-23 DIAGNOSIS — E785 Hyperlipidemia, unspecified: Secondary | ICD-10-CM | POA: Diagnosis not present

## 2012-02-23 DIAGNOSIS — E039 Hypothyroidism, unspecified: Secondary | ICD-10-CM | POA: Diagnosis not present

## 2012-02-23 DIAGNOSIS — Z96659 Presence of unspecified artificial knee joint: Secondary | ICD-10-CM | POA: Diagnosis not present

## 2012-02-23 DIAGNOSIS — Z5189 Encounter for other specified aftercare: Secondary | ICD-10-CM | POA: Diagnosis not present

## 2012-02-23 DIAGNOSIS — M25569 Pain in unspecified knee: Secondary | ICD-10-CM | POA: Diagnosis not present

## 2012-02-23 DIAGNOSIS — M1710 Unilateral primary osteoarthritis, unspecified knee: Secondary | ICD-10-CM | POA: Diagnosis not present

## 2012-02-23 DIAGNOSIS — I2510 Atherosclerotic heart disease of native coronary artery without angina pectoris: Secondary | ICD-10-CM | POA: Diagnosis not present

## 2012-02-23 LAB — CBC
Hemoglobin: 12.5 g/dL — ABNORMAL LOW (ref 13.0–17.0)
MCH: 32.5 pg (ref 26.0–34.0)
MCHC: 35.6 g/dL (ref 30.0–36.0)
RDW: 13.6 % (ref 11.5–15.5)

## 2012-02-23 LAB — PROTIME-INR
INR: 1.78 — ABNORMAL HIGH (ref 0.00–1.49)
Prothrombin Time: 20.1 seconds — ABNORMAL HIGH (ref 11.6–15.2)

## 2012-02-23 MED ORDER — ENOXAPARIN SODIUM 30 MG/0.3ML ~~LOC~~ SOLN: 30.0000 mg | Freq: Two times a day (BID) | SUBCUTANEOUS | Status: DC

## 2012-02-23 MED ORDER — WARFARIN SODIUM 5 MG PO TABS: 5.0000 mg | ORAL_TABLET | Freq: Once | ORAL | Status: DC

## 2012-02-23 MED ORDER — TRAMADOL HCL 50 MG PO TABS: 50.0000 mg | ORAL_TABLET | Freq: Four times a day (QID) | ORAL | Status: DC | PRN

## 2012-02-23 MED ORDER — OXYCODONE HCL 5 MG PO TABS: 5.0000 mg | ORAL_TABLET | ORAL | Status: DC | PRN

## 2012-02-23 MED ORDER — METHOCARBAMOL 500 MG PO TABS: 500.0000 mg | ORAL_TABLET | Freq: Four times a day (QID) | ORAL | Status: DC | PRN

## 2012-02-23 MED ORDER — DSS 100 MG PO CAPS: 100.0000 mg | ORAL_CAPSULE | Freq: Two times a day (BID) | ORAL | Status: DC

## 2012-02-23 MED ORDER — ACETAMINOPHEN 325 MG PO TABS: 650.0000 mg | ORAL_TABLET | Freq: Four times a day (QID) | ORAL | Status: DC | PRN

## 2012-02-23 MED ORDER — ONDANSETRON HCL 4 MG PO TABS: 4.0000 mg | ORAL_TABLET | Freq: Four times a day (QID) | ORAL | Status: DC | PRN

## 2012-02-23 MED ORDER — BISACODYL 10 MG RE SUPP: 10.0000 mg | Freq: Every day | RECTAL | Status: DC | PRN

## 2012-02-23 MED ORDER — POLYETHYLENE GLYCOL 3350 17 G PO PACK: 17.0000 g | PACK | Freq: Every day | ORAL | Status: DC | PRN

## 2012-02-23 NOTE — Progress Notes (Signed)
Physical Therapy Treatment Patient Details Name: Juan Li MRN: [id number] DOB: 07-16-1931 Today's Date: 02/23/2012 Time: 4132-4401 PT Time Calculation (min): 23 min  PT Assessment / Plan / Recommendation Comments on Treatment Session  Pt c/o muscle spasms - RN notified and supplied Muscle Relaxor    Follow Up Recommendations  Post acute inpatient     Does the patient have the potential to tolerate intense rehabilitation  No, Recommend SNF  Barriers to Discharge        Equipment Recommendations  None recommended by PT    Recommendations for Other Services OT consult  Frequency 7X/week   Plan Discharge plan remains appropriate    Precautions / Restrictions Precautions Precautions: Knee Required Braces or Orthoses: Knee Immobilizer - Right Knee Immobilizer - Right: Discontinue once straight leg raise with < 10 degree lag Restrictions Weight Bearing Restrictions: No Other Position/Activity Restrictions: WBAT   Pertinent Vitals/Pain 6/10; premedicated; Muscle relaxer requested and received, ice packs provided.    Mobility       Exercises Total Joint Exercises Ankle Circles/Pumps: AROM;Both;Supine;20 reps Quad Sets: AROM;Both;Supine;20 reps Heel Slides: AAROM;Right;Supine;20 reps Straight Leg Raises: AAROM;Right;Supine;20 reps   PT Diagnosis:    PT Problem List:   PT Treatment Interventions:     PT Goals Acute Rehab PT Goals PT Goal Formulation: With patient Time For Goal Achievement: 02/27/12 Potential to Achieve Goals: Fair  Visit Information  Last PT Received On: 02/23/12 Assistance Needed: +1    Subjective Data  Subjective: I'll do what you need me to do - you're the boss Patient Stated Goal: Resume previous lifestyle with decreased pain   Cognition  Overall Cognitive Status: Appears within functional limits for tasks assessed/performed Arousal/Alertness: Awake/alert Orientation Level: Appears intact for tasks assessed Behavior During Session:  [facility] for tasks performed    Balance     End of Session     GP     Arin Vanosdol 02/23/2012, 12:28 PM

## 2012-02-23 NOTE — Discharge Summary (Signed)
Physician Discharge Summary   Patient ID: Juan Li MRN: [id number] DOB/AGE: April 01, 1932 78 y.o.  Admit date: 02/20/2012 Discharge date: 02/23/2012  Primary Diagnosis: Osteoarthritis Right knee   Admission Diagnoses:  Past Medical History  Diagnosis Date  . Heart murmur   . Complication of anesthesia 02-15-12    very confused, and very slow to awaken after anesthesia  . Coronary artery disease 02-15-12    Heart valve replaced   . Hypercholesterolemia 02-15-12  . Hypertension   . Arthritis 02-15-12    osteoarthritis,right knee, pain right wrist.,   Discharge Diagnoses:   Principal Problem:  *OA (osteoarthritis) of knee Active Problems:  Postop Hyponatremia  Expected Postop Blood loss anemia  Estimated Body mass index is 31.80 kg/(m^2) as calculated from the following:   Height as of this encounter: 5\' 6" (1.676 m).   Weight as of this encounter: 197 lb(89.359 kg).  Classification of overweight in adults according to BMI (WHO, 1998)   Procedure:  Procedure(s) (LRB): TOTAL KNEE ARTHROPLASTY (Right)   Consults: None  HPI: Juan Li is a 78 y.o. year old male with end stage OA of his right knee with progressively worsening pain and dysfunction. He has constant pain, with activity and at rest and significant functional deficits with difficulties even with ADLs. He has had extensive non-op management including analgesics, injections of cortisone and viscosupplements, and home exercise program, but remains in significant pain with significant dysfunction. Radiographs show bone on bone arthritis medial and patellofemoral. He presents now for right Total Knee Arthroplasty.   Laboratory Data: Admission on 02/20/2012  Component Date Value Range Status  . Prothrombin Time 02/20/2012 14.3  11.6 - 15.2 seconds Final  . INR 02/20/2012 1.13  0.00 - 1.49 Final  . ABO/RH(D) 02/20/2012 B POS   Final  . Antibody Screen 02/20/2012 NEG   Final  . Sample Expiration 02/20/2012  02/23/2012   Final  . ABO/RH(D) 02/20/2012 B POS   Final  . Glucose-Capillary 02/20/2012 84  70 - 99 mg/dL Final  . Comment 1 78/29/5621 Notify RN   Final  . WBC 02/21/2012 12.1* 4.0 - 10.5 K/uL Final  . RBC 02/21/2012 4.05* 4.22 - 5.81 MIL/uL Final  . Hemoglobin 02/21/2012 12.9* 13.0 - 17.0 g/dL Final  . HCT 30/86/5784 37.2* 39.0 - 52.0 % Final  . MCV 02/21/2012 91.9  78.0 - 100.0 fL Final  . MCH 02/21/2012 31.9  26.0 - 34.0 pg Final  . MCHC 02/21/2012 34.7  30.0 - 36.0 g/dL Final  . RDW 69/62/9528 13.2  11.5 - 15.5 % Final  . Platelets 02/21/2012 180  150 - 400 K/uL Final  . Sodium 02/21/2012 133* 135 - 145 mEq/L Final  . Potassium 02/21/2012 4.4  3.5 - 5.1 mEq/L Final  . Chloride 02/21/2012 100  96 - 112 mEq/L Final  . CO2 02/21/2012 26  19 - 32 mEq/L Final  . Glucose, Bld 02/21/2012 127* 70 - 99 mg/dL Final  . BUN 41/32/4401 12  6 - 23 mg/dL Final  . Creatinine, Ser 02/21/2012 1.14  0.50 - 1.35 mg/dL Final  . Calcium 02/72/5366 8.5  8.4 - 10.5 mg/dL Final  . GFR calc non Af Amer 02/21/2012 60* >90 mL/min Final  . GFR calc Af Amer 02/21/2012 69* >90 mL/min Final   Comment:                                 The eGFR  has been calculated                          using the CKD EPI equation.                          This calculation has not been                          validated in all clinical                          situations.                          eGFR's persistently                          <90 mL/min signify                          possible Chronic Kidney Disease.  Marland Kitchen Prothrombin Time 02/21/2012 14.4  11.6 - 15.2 seconds Final  . INR 02/21/2012 1.14  0.00 - 1.49 Final  . WBC 02/22/2012 17.7* 4.0 - 10.5 K/uL Final  . RBC 02/22/2012 4.23  4.22 - 5.81 MIL/uL Final  . Hemoglobin 02/22/2012 13.5  13.0 - 17.0 g/dL Final  . HCT 11/91/4782 38.3* 39.0 - 52.0 % Final  . MCV 02/22/2012 90.5  78.0 - 100.0 fL Final  . MCH 02/22/2012 31.9  26.0 - 34.0 pg Final  . MCHC 02/22/2012 35.2   30.0 - 36.0 g/dL Final  . RDW 95/62/1308 13.2  11.5 - 15.5 % Final  . Platelets 02/22/2012 201  150 - 400 K/uL Final  . Sodium 02/22/2012 133* 135 - 145 mEq/L Final  . Potassium 02/22/2012 4.0  3.5 - 5.1 mEq/L Final  . Chloride 02/22/2012 97  96 - 112 mEq/L Final  . CO2 02/22/2012 25  19 - 32 mEq/L Final  . Glucose, Bld 02/22/2012 131* 70 - 99 mg/dL Final  . BUN 65/78/4696 16  6 - 23 mg/dL Final  . Creatinine, Ser 02/22/2012 1.10  0.50 - 1.35 mg/dL Final  . Calcium 29/52/8413 9.7  8.4 - 10.5 mg/dL Final  . GFR calc non Af Amer 02/22/2012 62* >90 mL/min Final  . GFR calc Af Amer 02/22/2012 72* >90 mL/min Final   Comment:                                 The eGFR has been calculated                          using the CKD EPI equation.                          This calculation has not been                          validated in all clinical                          situations.  eGFR's persistently                          <90 mL/min signify                          possible Chronic Kidney Disease.  Marland Kitchen Prothrombin Time 02/22/2012 18.6* 11.6 - 15.2 seconds Final  . INR 02/22/2012 1.61* 0.00 - 1.49 Final  . WBC 02/23/2012 16.5* 4.0 - 10.5 K/uL Final  . RBC 02/23/2012 3.85* 4.22 - 5.81 MIL/uL Final  . Hemoglobin 02/23/2012 12.5* 13.0 - 17.0 g/dL Final  . HCT 16/01/9603 35.1* 39.0 - 52.0 % Final  . MCV 02/23/2012 91.2  78.0 - 100.0 fL Final  . MCH 02/23/2012 32.5  26.0 - 34.0 pg Final  . MCHC 02/23/2012 35.6  30.0 - 36.0 g/dL Final  . RDW 54/12/8117 13.6  11.5 - 15.5 % Final  . Platelets 02/23/2012 234  150 - 400 K/uL Final  . Prothrombin Time 02/23/2012 20.1* 11.6 - 15.2 seconds Final  . INR 02/23/2012 1.78* 0.00 - 1.49 Final  Hospital Outpatient Visit on 02/15/2012  Component Date Value Range Status  . MRSA, PCR 02/15/2012 NEGATIVE  NEGATIVE Final  . Staphylococcus aureus 02/15/2012 NEGATIVE  NEGATIVE Final   Comment:                                 The Xpert  SA Assay (FDA                          approved for NASAL specimens                          in patients over 54 years of age),                          is one component of                          a comprehensive surveillance                          program.  Test performance has                          been validated by [employer] for patients greater                          than or equal to 76 year old.                          It is not intended                          to diagnose infection nor to                          guide or monitor treatment.  Marland Kitchen aPTT 02/15/2012 42* 24 - 37 seconds  Final   Comment:                                 IF BASELINE aPTT IS ELEVATED,                          SUGGEST PATIENT RISK ASSESSMENT                          BE USED TO DETERMINE APPROPRIATE                          ANTICOAGULANT THERAPY.  . WBC 02/15/2012 8.1  4.0 - 10.5 K/uL Final  . RBC 02/15/2012 4.68  4.22 - 5.81 MIL/uL Final  . Hemoglobin 02/15/2012 15.2  13.0 - 17.0 g/dL Final  . HCT 21/30/8657 43.5  39.0 - 52.0 % Final  . MCV 02/15/2012 92.9  78.0 - 100.0 fL Final  . MCH 02/15/2012 32.5  26.0 - 34.0 pg Final  . MCHC 02/15/2012 34.9  30.0 - 36.0 g/dL Final  . RDW 84/69/6295 13.6  11.5 - 15.5 % Final  . Platelets 02/15/2012 206  150 - 400 K/uL Final  . Sodium 02/15/2012 138  135 - 145 mEq/L Final  . Potassium 02/15/2012 4.6  3.5 - 5.1 mEq/L Final  . Chloride 02/15/2012 103  96 - 112 mEq/L Final  . CO2 02/15/2012 26  19 - 32 mEq/L Final  . Glucose, Bld 02/15/2012 89  70 - 99 mg/dL Final  . BUN 28/41/3244 23  6 - 23 mg/dL Final  . Creatinine, Ser 02/15/2012 1.21  0.50 - 1.35 mg/dL Final  . Calcium 05/04/7251 10.0  8.4 - 10.5 mg/dL Final  . Total Protein 02/15/2012 7.7  6.0 - 8.3 g/dL Final  . Albumin 66/44/0347 4.2  3.5 - 5.2 g/dL Final  . AST 42/59/5638 21  0 - 37 U/L Final  . ALT 02/15/2012 11  0 - 53 U/L Final  . Alkaline Phosphatase 02/15/2012 59  39  - 117 U/L Final  . Total Bilirubin 02/15/2012 1.0  0.3 - 1.2 mg/dL Final  . GFR calc non Af Amer 02/15/2012 56* >90 mL/min Final  . GFR calc Af Amer 02/15/2012 64* >90 mL/min Final   Comment:                                 The eGFR has been calculated                          using the CKD EPI equation.                          This calculation has not been                          validated in all clinical                          situations.                          eGFR's persistently                          <  90 mL/min signify                          possible Chronic Kidney Disease.  Marland Kitchen Prothrombin Time 02/15/2012 20.9* 11.6 - 15.2 seconds Final  . INR 02/15/2012 1.88* 0.00 - 1.49 Final  . Color, Urine 02/15/2012 YELLOW  YELLOW Final  . APPearance 02/15/2012 CLEAR  CLEAR Final  . Specific Gravity, Urine 02/15/2012 1.021  1.005 - 1.030 Final  . pH 02/15/2012 6.5  5.0 - 8.0 Final  . Glucose, UA 02/15/2012 NEGATIVE  NEGATIVE mg/dL Final  . Hgb urine dipstick 02/15/2012 NEGATIVE  NEGATIVE Final  . Bilirubin Urine 02/15/2012 NEGATIVE  NEGATIVE Final  . Ketones, ur 02/15/2012 NEGATIVE  NEGATIVE mg/dL Final  . Protein, ur 19/14/7829 NEGATIVE  NEGATIVE mg/dL Final  . Urobilinogen, UA 02/15/2012 1.0  0.0 - 1.0 mg/dL Final  . Nitrite 56/21/3086 NEGATIVE  NEGATIVE Final  . Leukocytes, UA 02/15/2012 NEGATIVE  NEGATIVE Final   MICROSCOPIC NOT DONE ON URINES WITH NEGATIVE PROTEIN, BLOOD, LEUKOCYTES, NITRITE, OR GLUCOSE <1000 mg/dL.  Anti-coag visit on 02/08/2012  Component Date Value Range Status  . INR 02/08/2012 1.1   Final  Office Visit on 02/01/2012  Component Date Value Range Status  . Cholesterol 02/01/2012 202* 0 - 200 mg/dL Final   ATP III Classification       Desirable:  < 200 mg/dL               Borderline High:  200 - 239 mg/dL          High:  > = 578 mg/dL  . Triglycerides 02/01/2012 176.0* 0.0 - 149.0 mg/dL Final   Normal:  <469 mg/dLBorderline High:  150 - 199 mg/dL  . HDL  02/01/2012 36.40* >39.00 mg/dL Final  . VLDL 62/95/2841 35.2  0.0 - 40.0 mg/dL Final  . Total CHOL/HDL Ratio 02/01/2012 6   Final                  Men          Women1/2 Average Risk     3.4          3.3Average Risk          5.0          4.42X Average Risk          9.6          7.13X Average Risk          15.0          11.0                      . Total Bilirubin 02/01/2012 1.3* 0.3 - 1.2 mg/dL Final  . Bilirubin, Direct 02/01/2012 0.2  0.0 - 0.3 mg/dL Final  . Alkaline Phosphatase 02/01/2012 46  39 - 117 U/L Final  . AST 02/01/2012 25  0 - 37 U/L Final  . ALT 02/01/2012 13  0 - 53 U/L Final  . Total Protein 02/01/2012 7.6  6.0 - 8.3 g/dL Final  . Albumin 32/44/0102 4.3  3.5 - 5.2 g/dL Final  . Sodium 72/53/6644 137  135 - 145 mEq/L Final  . Potassium 02/01/2012 4.5  3.5 - 5.1 mEq/L Final  . Chloride 02/01/2012 104  96 - 112 mEq/L Final  . CO2 02/01/2012 24  19 - 32 mEq/L Final  . Glucose, Bld 02/01/2012 102* 70 - 99 mg/dL Final  . BUN 03/47/4259 29*  6 - 23 mg/dL Final  . Creatinine, Ser 02/01/2012 1.6* 0.4 - 1.5 mg/dL Final  . Calcium 66/44/0347 9.4  8.4 - 10.5 mg/dL Final  . GFR 42/59/5638 44.00* >60.00 mL/min Final  . Direct LDL 02/01/2012 135.5   Final   Optimal:  <100 mg/dLNear or Above Optimal:  100-129 mg/dLBorderline High:  130-159 mg/dLHigh:  160-189 mg/dLVery High:  >190 mg/dL  Anti-coag visit on 75/64/3329  Component Date Value Range Status  . INR 01/27/2012 2.0   Final  Anti-coag visit on 01/13/2012  Component Date Value Range Status  . INR 01/13/2012 3.5   Final  Anti-coag visit on 01/04/2012  Component Date Value Range Status  . INR 01/04/2012 4.0   Final     X-Rays:Dg Chest 2 View  02/15/2012  *RADIOLOGY REPORT*  Clinical Data: Preoperative assessment right total knee replacement, hypertension, former smoker  CHEST - 2 VIEW  Comparison: 02/01/2010  Findings: Normal heart size post median sternotomy. Mediastinal contours and pulmonary vascularity normal. Lungs clear. No  pleural effusion or pneumothorax. Prior cervical spine fusion with mild scattered end plate spur formation thoracic spine. Probable right nipple shadow, also noted on a prior study of 02/07/2008.  IMPRESSION: No acute abnormalities.   Original Report Authenticated By: Lollie Marrow, M.D.     EKG: Orders placed in visit on 02/01/12  . EKG 12-LEAD     Hospital Course:  Juan Li is a 78 y.o. who was admitted to [facility]. They were brought to the operating room on 02/20/2012 and underwent Procedure(s): TOTAL KNEE ARTHROPLASTY.  Patient tolerated the procedure well and was later transferred to the recovery room and then to the orthopaedic floor for postoperative care.  They were given PO and IV analgesics for pain control following their surgery.  They were given 24 hours of postoperative antibiotics of  Anti-infectives     Start     Dose/Rate Route Frequency Ordered Stop   02/20/12 1400   ceFAZolin (ANCEF) IVPB 2 g/50 mL premix        2 g 100 mL/hr over 30 Minutes Intravenous 4 times per day 02/20/12 1040 02/20/12 1732   02/20/12 0557   ceFAZolin (ANCEF) IVPB 2 g/50 mL premix        2 g 100 mL/hr over 30 Minutes Intravenous 60 min pre-op 02/20/12 0557 02/20/12 0720         and started on DVT prophylaxis in the form of Lovenox and Coumadin.   He was given a Lovenox bridge preoperatively. He was placed back on his coumadin postop along with a Lovenox bridge again. Continue Lovenox injections until the INR is therapeutic at or greater than 2.0. When INR reaches the therapeutic level of equal to or greater than 2.0, the patient may discontinue the Lovenox injections.  PT and OT were ordered for total joint protocol.  Discharge planning consulted to help with postop disposition and equipment needs.  Patient had a good night on the evening of surgery and started to get up OOB with therapy on day one and walked 7 feet. Hemovac drain was pulled without difficulty.  He was confused to  his location on the morning of day two upon waking up at beginning of the visit but reoriented quickly and knew person, time and events. Appropriate for the visit. Did fairly well and day two. Continued to work with therapy into day two.  Dressing was changed on day two and the incision was healing well.  By day three,  the patient had progressed with therapy and meeting their goals.  Incision was healing well.  Patient was seen in rounds by Dr. Lequita Halt and was ready to go the SNF, [facility].  He was at a risk for post op delirium due to his history of mild dementia as per his medical physician but did very well post with the exception of the one episode on morning of day two. 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 He showed progress daily with therapy and it was felt that he was an excellent candidate for the skilled rehab.  Discharge Medications: Prior to Admission medications   Medication Sig Start Date End Date Taking? Authorizing Provider  amLODipine (NORVASC) 10 MG tablet Take 10 mg by mouth every morning.  02/03/12  Yes Laurey Morale, MD  donepezil (ARICEPT) 5 MG tablet Take 5 mg by mouth every morning.   Yes Historical Provider, MD  dutasteride (AVODART) 0.5 MG capsule Take 0.5 mg by mouth every evening.    Yes Historical Provider, MD  hydrochlorothiazide (HYDRODIURIL) 12.5 MG tablet Take 12.5 mg by mouth every morning.  05/26/11  Yes Historical Provider, MD  memantine (NAMENDA) 5 MG tablet Take 5 mg by mouth 2 (two) times daily.   Yes Historical Provider, MD  metoprolol (LOPRESSOR) 50 MG tablet Take 50 mg by mouth 2 (two) times daily.   Yes Historical Provider, MD  omeprazole (PRILOSEC) 20 MG capsule Take 1 capsule (20 mg total) by mouth daily. 09/01/10  Yes Sanjuana Letters, MD  pravastatin (PRAVACHOL) 40 MG tablet Take 40 mg by mouth 2 (two) times daily.   Yes Historical Provider, MD  acetaminophen (TYLENOL)  325 MG tablet Take 2 tablets (650 mg total) by mouth every 6 (six) hours as needed (or Fever >/= 101). 02/23/12   Nobuko Gsell Julien Girt, PA  aspirin EC 81 MG tablet Take 81 mg by mouth every morning.  04/20/11   Laurey Morale, MD  bisacodyl (DULCOLAX) 10 MG suppository Place 1 suppository (10 mg total) rectally daily as needed. 02/23/12   Irene Collings, PA  docusate sodium 100 MG CAPS Take 100 mg by mouth 2 (two) times daily. 02/23/12   Kimberley Speece, PA  enalapril (VASOTEC) 20 MG tablet Take 20 mg by mouth every morning.    Historical Provider, MD  enoxaparin (LOVENOX) 30 MG/0.3ML injection Inject 0.3 mLs (30 mg total) into the skin every 12 (twelve) hours. Continue Lovenox injections until the INR is therapeutic at or greater than 2.0.  When INR reaches the therapeutic level of equal to or greater than 2.0, the patient may discontinue the Lovenox injections. 02/23/12   Willis Kuipers Julien Girt, PA  methocarbamol (ROBAXIN) 500 MG tablet Take 1 tablet (500 mg total) by mouth every 6 (six) hours as needed. 02/23/12   Theia Dezeeuw, PA  ondansetron (ZOFRAN) 4 MG tablet Take 1 tablet (4 mg total) by mouth every 6 (six) hours as needed for nausea. 02/23/12   Shyonna Carlin, PA  oxyCODONE (OXY IR/ROXICODONE) 5 MG immediate release tablet Take 1-2 tablets (5-10 mg total) by mouth every 3 (three) hours as needed. 02/23/12   Keion Neels, PA  polyethylene glycol (MIRALAX / GLYCOLAX) packet Take 17 g by mouth daily as needed. 02/23/12   Garlan Drewes Julien Girt, PA  Tamsulosin HCl (FLOMAX) 0.4 MG CAPS Take 1 capsule (0.4 mg total) by mouth daily. 11/28/11   Sanjuana Letters, MD  traMADol (ULTRAM) 50 MG tablet Take  1-2 tablets (50-100 mg total) by mouth every 6 (six) hours as needed (Mild Pain). 02/23/12   Vesta Wheeland Julien Girt, PA  warfarin (COUMADIN) 5 MG tablet Take 1 tablet (5 mg total) by mouth one time only at 6 PM. Take Coumadin for three weeks for postoperative protocol and then the  patient may resume their previous Coumadin home regimen.  The dose may need to be adjusted based upon the INR.  Please follow the INR and titrate Coumadin dose for a therapeutic range between 2.0 and 3.0 INR.  After completing the three weeks of Coumadin, the patient may resume their previous Coumadin home regimen. 02/23/12   Leatta Alewine Julien Girt, PA    Diet: Cardiac diet Activity:WBAT Follow-up:in 2 weeks Disposition - Skilled nursing facility - [facility] Discharged Condition: good   Discharge Orders    Future Appointments: Provider: Department: Dept Phone: Center:   04/03/2012 10:10 AM [facility] [phone] [facility]   04/10/2012 10:40 AM [facility] [phone] [facility]   04/20/2012 9:45 AM Laurey Morale, MD [facility] [phone] [facility]     Future Orders Please Complete By Expires   Diet - low sodium heart healthy      Call MD / Call 911      Comments:   If you experience chest pain or shortness of breath, CALL 911 and be transported to the hospital emergency room.  If you develope a fever above 101 F, pus (white drainage) or increased drainage or redness at the wound, or calf pain, call your surgeon's office.   Discharge instructions      Comments:   Pick up stool softner and laxative for home. Do not submerge incision under water. May shower. Continue to use ice for pain and swelling from surgery.  Take Coumadin for three weeks for postoperative protocol and then the patient may resume their previous Coumadin home regimen.  The dose may need to be adjusted based upon the INR.  Please follow the INR and titrate Coumadin dose for a therapeutic range between 2.0 and 3.0 INR.  After completing the three weeks of Coumadin, the patient may resume their previous Coumadin home regimen.  Continue Lovenox injections until the INR is therapeutic at or greater than 2.0.  When INR reaches the therapeutic  level of equal to or greater than 2.0, the patient may discontinue the Lovenox injections.  When discharged from the skilled rehab facility, please have the facility set up the patient's Home Health Physical Therapy prior to being released.  Also provide the patient with their medications at time of release from the facility to include their pain medication, the muscle relaxants, and their blood thinner medication.  If the patient is still at the rehab facility at time of follow up appointment, please also assist the patient in arranging follow up appointment in our office and any transportation needs.    Constipation Prevention      Comments:   Drink plenty of fluids.  Prune juice may be helpful.  You may use a stool softener, such as Colace (over the counter) 100 mg twice a day.  Use MiraLax (over the counter) for constipation as needed.   Increase activity slowly as tolerated      Patient may shower      Comments:   You may shower without a dressing once there is no drainage.  Do not wash over the wound.  If drainage remains, do not shower until drainage stops.   Weight  bearing as tolerated      Driving restrictions      Comments:   No driving until released by the physician.   Lifting restrictions      Comments:   No lifting until released by the physician.   TED hose      Comments:   Use stockings (TED hose) for 3 weeks on both leg(s).  You may remove them at night for sleeping.   Change dressing      Comments:   Change dressing daily with sterile 4 x 4 inch gauze dressing and apply TED hose. Do not submerge the incision under water.   Do not put a pillow under the knee. Place it under the heel.      Do not sit on low chairs, stoools or toilet seats, as it may be difficult to get up from low surfaces          Medication List     As of 02/23/2012  7:40 AM    STOP taking these medications         cyclobenzaprine 5 MG tablet   Commonly known as: FLEXERIL      fish oil-omega-3  fatty acids 1000 MG capsule      HYDROcodone-acetaminophen 10-500 MG per tablet   Commonly known as: LORTAB      TAKE these medications         acetaminophen 325 MG tablet   Commonly known as: TYLENOL   Take 2 tablets (650 mg total) by mouth every 6 (six) hours as needed (or Fever >/= 101).      amLODipine 10 MG tablet   Commonly known as: NORVASC   Take 10 mg by mouth every morning.      aspirin EC 81 MG tablet   Take 81 mg by mouth every morning.      bisacodyl 10 MG suppository   Commonly known as: DULCOLAX   Place 1 suppository (10 mg total) rectally daily as needed.      donepezil 5 MG tablet   Commonly known as: ARICEPT   Take 5 mg by mouth every morning.      DSS 100 MG Caps   Take 100 mg by mouth 2 (two) times daily.      dutasteride 0.5 MG capsule   Commonly known as: AVODART   Take 0.5 mg by mouth every evening.      enalapril 20 MG tablet   Commonly known as: VASOTEC   Take 20 mg by mouth every morning.      enoxaparin 30 MG/0.3ML injection   Commonly known as: LOVENOX   Inject 0.3 mLs (30 mg total) into the skin every 12 (twelve) hours. Continue Lovenox injections until the INR is therapeutic at or greater than 2.0.  When INR reaches the therapeutic level of equal to or greater than 2.0, the patient may discontinue the Lovenox injections.      hydrochlorothiazide 12.5 MG tablet   Commonly known as: HYDRODIURIL   Take 12.5 mg by mouth every morning.      memantine 5 MG tablet   Commonly known as: NAMENDA   Take 5 mg by mouth 2 (two) times daily.      methocarbamol 500 MG tablet   Commonly known as: ROBAXIN   Take 1 tablet (500 mg total) by mouth every 6 (six) hours as needed.      metoprolol 50 MG tablet   Commonly known as: LOPRESSOR   Take 50 mg by mouth  2 (two) times daily.      omeprazole 20 MG capsule   Commonly known as: PRILOSEC   Take 1 capsule (20 mg total) by mouth daily.      ondansetron 4 MG tablet   Commonly known as: ZOFRAN    Take 1 tablet (4 mg total) by mouth every 6 (six) hours as needed for nausea.      oxyCODONE 5 MG immediate release tablet   Commonly known as: Oxy IR/ROXICODONE   Take 1-2 tablets (5-10 mg total) by mouth every 3 (three) hours as needed.      polyethylene glycol packet   Commonly known as: MIRALAX / GLYCOLAX   Take 17 g by mouth daily as needed.      pravastatin 40 MG tablet   Commonly known as: PRAVACHOL   Take 40 mg by mouth 2 (two) times daily.      Tamsulosin HCl 0.4 MG Caps   Commonly known as: FLOMAX   Take 1 capsule (0.4 mg total) by mouth daily.      traMADol 50 MG tablet   Commonly known as: ULTRAM   Take 1-2 tablets (50-100 mg total) by mouth every 6 (six) hours as needed (Mild Pain).      warfarin 5 MG tablet   Commonly known as: COUMADIN   Take 1 tablet (5 mg total) by mouth one time only at 6 PM. Take Coumadin for three weeks for postoperative protocol and then the patient may resume their previous Coumadin home regimen.  The dose may need to be adjusted based upon the INR.  Please follow the INR and titrate Coumadin dose for a therapeutic range between 2.0 and 3.0 INR.  After completing the three weeks of Coumadin, the patient may resume their previous Coumadin home regimen.           Follow-up Information    Follow up with Loanne Drilling, MD. Schedule an appointment as soon as possible for a visit in 2 weeks.   Contact information:   [address], [address] [place] [address] [phone]          Signed: Patrica Duel 02/23/2012, 7:40 AM

## 2012-02-23 NOTE — Progress Notes (Signed)
   Subjective: 3 Days Post-Op Procedure(s) (LRB): TOTAL KNEE ARTHROPLASTY (Right) Patient reports pain as mild.   Patient seen in rounds with Dr. Lequita Halt. Patient is well, but has had some minor complaints of pain in the knee, requiring pain medications Patient is ready to go [employer].  Objective: Vital signs in last 24 hours: Temp:  [97.9 F (36.6 C)-98.3 F (36.8 C)] 98.3 F (36.8 C) (10/24 0515) Pulse Rate:  [72-83] 72  (10/24 0515) Resp:  [16-20] 20  (10/24 0515) BP: (148-164)/(67-73) 164/72 mmHg (10/24 0515) SpO2:  [8 %-99 %] 8 % (10/24 0515)  Intake/Output from previous day:  Intake/Output Summary (Last 24 hours) at 02/23/12 0727 Last data filed at 02/23/12 0649  Gross per 24 hour  Intake   1080 ml  Output    940 ml  Net    140 ml    Labs:  Basename 02/23/12 0450 02/22/12 0420 02/21/12 0425  HGB 12.5* 13.5 12.9*    Basename 02/23/12 0450 02/22/12 0420  WBC 16.5* 17.7*  RBC 3.85* 4.23  HCT 35.1* 38.3*  PLT 234 201    Basename 02/22/12 0420 02/21/12 0425  NA 133* 133*  K 4.0 4.4  CL 97 100  CO2 25 26  BUN 16 12  CREATININE 1.10 1.14  GLUCOSE 131* 127*  CALCIUM 9.7 8.5    Basename 02/23/12 0450 02/22/12 0420  LABPT -- --  INR 1.78* 1.61*    EXAM: General - Patient is Alert, Appropriate and Oriented Extremity - Neurovascular intact Sensation intact distally Dorsiflexion/Plantar flexion intact No cellulitis present Incision - clean, dry, no drainage, healing Motor Function - intact, moving foot and toes well on exam.   Assessment/Plan: 3 Days Post-Op Procedure(s) (LRB): TOTAL KNEE ARTHROPLASTY (Right) Procedure(s) (LRB): TOTAL KNEE ARTHROPLASTY (Right) Past Medical History  Diagnosis Date  . Heart murmur   . Complication of anesthesia 02-15-12    very confused, and very slow to awaken after anesthesia  . Coronary artery disease 02-15-12    Heart valve replaced   . Hypercholesterolemia 02-15-12  . Hypertension   . Arthritis 02-15-12     osteoarthritis,right knee, pain right wrist.,   Principal Problem:  *OA (osteoarthritis) of knee Active Problems:  Postop Hyponatremia  Expected Postop Blood loss anemia  Estimated Body mass index is 31.80 kg/(m^2) as calculated from the following:   Height as of this encounter: 5\' 6" (1.676 m).   Weight as of this encounter: 197 lb(89.359 kg). Discharge to SNF Diet - Cardiac diet Follow up - in 2 weeks Activity - WBAT Disposition - Skilled nursing facility Condition Upon Discharge - Good D/C Meds - See DC Summary DVT Prophylaxis - Lovenox and Coumadin, INR 1.78 Continue Lovenox injections until the INR is therapeutic at or greater than 2.0. When INR reaches the therapeutic level of equal to or greater than 2.0, the patient may discontinue the Lovenox injections.  He was given a Lovenox bridge preoperatively. He was placed back on his coumadin postop along with a Lovenox bridge again. Continue with therapy today and transfer over to SNF later today.   Iyahna Obriant 02/23/2012, 7:27 AM

## 2012-02-23 NOTE — Care Management Note (Signed)
    Page 1 of 2   02/23/2012     11:19:31 AM   CARE MANAGEMENT NOTE 02/23/2012  Patient:  Juan Li, Juan Li   Account Number:  [id number]  Date Initiated:  02/21/2012  Documentation initiated by:  Colleen Can  Subjective/Objective Assessment:   dx osteoarthritis right knee; total knee replacemnt     Action/Plan:   SNF rehab arrangemnt made prior to admission   Anticipated DC Date:  02/23/2012   Anticipated DC Plan:  SKILLED NURSING FACILITY  In-house referral  Clinical Social Worker      DC Planning Services  CM consult      [facility] Choice  NA   Choice offered to / List presented to:  NA   DME arranged  NA      DME agency  NA     HH arranged  NA      HH agency  NA   Status of service:  Completed, signed off Medicare Important Message given?  NA - LOS <3 / Initial given by admissions (If response is "NO", the following Medicare IM given date fields will be blank) Date Medicare IM given:   Date Additional Medicare IM given:    Discharge Disposition:  SKILLED NURSING FACILITY  Per UR Regulation:  Reviewed for med. necessity/level of care/duration of stay  If discussed at Long Length of Stay Meetings, dates discussed:    Comments:

## 2012-02-23 NOTE — Progress Notes (Signed)
Clinical Social Work Department CLINICAL SOCIAL WORK PLACEMENT NOTE 02/23/2012  Patient:  Juan Li, Juan Li  Account Number:  [id number] Admit date:  02/20/2012  Clinical Social Worker:  Cori Razor, LCSW  Date/time:  02/21/2012 12:48 PM  Clinical Social Work is seeking post-discharge placement for this patient at the following level of care:   SKILLED NURSING   (*CSW will update this form in Epic as items are completed)     Patient/family provided with [facility] Department of Clinical Social Work's list of facilities offering this level of care within the geographic area requested by the patient (or if unable, by the patient's family).    Patient/family informed of their freedom to choose among providers that offer the needed level of care, that participate in Medicare, Medicaid or managed care program needed by the patient, have an available bed and are willing to accept the patient.    Patient/family informed of [facility]' ownership interest in [facility], as well as of the fact that they are under no obligation to receive care at this facility.  PASARR submitted to EDS on 02/20/2012 PASARR number received from EDS on 02/20/2012  FL2 transmitted to all facilities in geographic area requested by pt/family on  02/21/2012 FL2 transmitted to all facilities within larger geographic area on   Patient informed that his/her managed care company has contracts with or will negotiate with  certain facilities, including the following:     Patient/family informed of bed offers received:  02/21/2012 Patient chooses bed at [facility] Physician recommends and patient chooses bed at    Patient to be transferred to [facility] on   Patient to be transferred to facility by   The following physician request were entered in Epic:   Additional Comments:  Cori Razor LCSW [phone]

## 2012-02-29 DIAGNOSIS — I2510 Atherosclerotic heart disease of native coronary artery without angina pectoris: Secondary | ICD-10-CM | POA: Diagnosis not present

## 2012-02-29 DIAGNOSIS — I10 Essential (primary) hypertension: Secondary | ICD-10-CM | POA: Diagnosis not present

## 2012-02-29 DIAGNOSIS — D62 Acute posthemorrhagic anemia: Secondary | ICD-10-CM | POA: Diagnosis not present

## 2012-03-27 ENCOUNTER — Ambulatory Visit (INDEPENDENT_AMBULATORY_CARE_PROVIDER_SITE_OTHER): Payer: Medicare Other

## 2012-03-27 VITALS — BP 108/57 | HR 72 | Ht 65.5 in | Wt 196.0 lb

## 2012-03-27 DIAGNOSIS — Z96659 Presence of unspecified artificial knee joint: Secondary | ICD-10-CM | POA: Diagnosis not present

## 2012-03-27 DIAGNOSIS — R5383 Other fatigue: Secondary | ICD-10-CM

## 2012-03-27 DIAGNOSIS — Z954 Presence of other heart-valve replacement: Secondary | ICD-10-CM | POA: Diagnosis not present

## 2012-03-27 DIAGNOSIS — Z952 Presence of prosthetic heart valve: Secondary | ICD-10-CM

## 2012-03-27 DIAGNOSIS — R5381 Other malaise: Secondary | ICD-10-CM | POA: Diagnosis not present

## 2012-03-27 NOTE — Progress Notes (Signed)
Can see if he can get in to see me earlier or see PA.

## 2012-03-27 NOTE — Progress Notes (Signed)
Pt's wife walked into office and stated that the pt. was "not feeling well" this morning and she wanted Dr. Shirlee Latch to see him.   Pt's wife states that pt. was released from nursing home on Friday after having right TKR done on 10-21 with Dr. Lequita Halt. Pt's wife states that pt's BP has been going up and down but she does not know what his BP is b/c she nor the pt. has been monitoring it. The pt. denies CP, sob, edema, dizziness or nausea and states he feels fine. His BP is 108/57 and HR is 72 at this time. Pt. Is scheduled to see Dr. Shirlee Latch on 04/20/12.  Pt. and his wife are advised to monitor his BP and to call office to report abnormal BP's and any s/s. They both verbalized understanding.

## 2012-03-28 NOTE — Progress Notes (Signed)
**Note De-Identified  Obfuscation** Pt. states that the appt. he has already scheduled is fine with him.

## 2012-04-03 ENCOUNTER — Other Ambulatory Visit: Payer: Medicare Other

## 2012-04-04 ENCOUNTER — Ambulatory Visit (INDEPENDENT_AMBULATORY_CARE_PROVIDER_SITE_OTHER): Payer: Medicare Other | Admitting: Family Medicine

## 2012-04-04 ENCOUNTER — Encounter: Payer: Self-pay | Admitting: Family Medicine

## 2012-04-04 VITALS — BP 131/78 | HR 78 | Temp 97.9°F | Ht 65.0 in | Wt 184.4 lb

## 2012-04-04 DIAGNOSIS — I10 Essential (primary) hypertension: Secondary | ICD-10-CM

## 2012-04-04 DIAGNOSIS — M109 Gout, unspecified: Secondary | ICD-10-CM | POA: Diagnosis not present

## 2012-04-04 DIAGNOSIS — Z7901 Long term (current) use of anticoagulants: Secondary | ICD-10-CM

## 2012-04-04 LAB — PROTIME-INR: INR: 1.91 — ABNORMAL HIGH (ref <1.50)

## 2012-04-04 MED ORDER — ZOSTER VACCINE LIVE 19400 UNT/0.65ML ~~LOC~~ SOLR: 0.6500 mL | Freq: Once | SUBCUTANEOUS | Status: DC

## 2012-04-04 NOTE — Patient Instructions (Addendum)
I sent the prescription for the shingles vaccine to the pharmacy.  Please let me know when you get it. I will call tomorrow with the blood work results.

## 2012-04-06 NOTE — Progress Notes (Signed)
  Subjective:    Patient ID: Juan Li, male    DOB: 03-27-1932, 78 y.o.   MRN: [id number]  HPI  Needs PT/INR check.  Goal is 2-3.  Generally followed by cards but missed appointment  Abrupt onset of right wrist pain.  States severe.  Came after sudden wrist movement.  Does have hx of gout but only one documented episode.     Review of Systems     Objective:   Physical Exam Wrist is diffusely and exquisitely tender.       Assessment & Plan:

## 2012-04-06 NOTE — Assessment & Plan Note (Signed)
Good control, but watch BP off HCTZ, which is stopped due to gout.

## 2012-04-06 NOTE — Assessment & Plan Note (Signed)
Uric acid high.  Called 2 days later and wrist improving.  Will DC HCTZ and keep an eye on BP.

## 2012-04-06 NOTE — Assessment & Plan Note (Signed)
Near goal, but has run low consistently.  Increase dose to 7.5 mg 2 days per week and 5 mg other days.  FU PT/INR in one week.

## 2012-04-10 ENCOUNTER — Other Ambulatory Visit (INDEPENDENT_AMBULATORY_CARE_PROVIDER_SITE_OTHER): Payer: Medicare Other

## 2012-04-10 ENCOUNTER — Ambulatory Visit: Payer: Medicare Other | Admitting: *Deleted

## 2012-04-10 ENCOUNTER — Ambulatory Visit (INDEPENDENT_AMBULATORY_CARE_PROVIDER_SITE_OTHER): Payer: Medicare Other

## 2012-04-10 VITALS — BP 122/56

## 2012-04-10 DIAGNOSIS — Z954 Presence of other heart-valve replacement: Secondary | ICD-10-CM | POA: Diagnosis not present

## 2012-04-10 DIAGNOSIS — I10 Essential (primary) hypertension: Secondary | ICD-10-CM

## 2012-04-10 DIAGNOSIS — Z952 Presence of prosthetic heart valve: Secondary | ICD-10-CM

## 2012-04-10 DIAGNOSIS — I350 Nonrheumatic aortic (valve) stenosis: Secondary | ICD-10-CM

## 2012-04-10 DIAGNOSIS — Z7901 Long term (current) use of anticoagulants: Secondary | ICD-10-CM

## 2012-04-10 DIAGNOSIS — E7800 Pure hypercholesterolemia, unspecified: Secondary | ICD-10-CM

## 2012-04-10 DIAGNOSIS — I359 Nonrheumatic aortic valve disorder, unspecified: Secondary | ICD-10-CM

## 2012-04-10 LAB — LIPID PANEL
Cholesterol: 142 mg/dL (ref 0–200)
VLDL: 38.4 mg/dL (ref 0.0–40.0)

## 2012-04-10 LAB — HEPATIC FUNCTION PANEL
ALT: 20 U/L (ref 0–53)
AST: 20 U/L (ref 0–37)
Albumin: 3.5 g/dL (ref 3.5–5.2)

## 2012-04-10 NOTE — Progress Notes (Signed)
Patient dropped in for BP check today . He has just had labs drawn at cardiologist. He wonders when he has appointment for Coumadin Clinic at cardiology. I called and was told he has not had coumadin checked since 01/2012. PT/INR was checked in our office last week with instruction to have checked again in one week. He will call cardiology to schedule a time for oday or tomorrow.     BP checked manually using regular adult cuff. BP 122/56 pulse 56.

## 2012-04-20 ENCOUNTER — Encounter: Payer: Self-pay | Admitting: Cardiology

## 2012-04-20 ENCOUNTER — Ambulatory Visit (INDEPENDENT_AMBULATORY_CARE_PROVIDER_SITE_OTHER): Payer: Medicare Other | Admitting: Cardiology

## 2012-04-20 ENCOUNTER — Ambulatory Visit (INDEPENDENT_AMBULATORY_CARE_PROVIDER_SITE_OTHER): Payer: Medicare Other | Admitting: *Deleted

## 2012-04-20 VITALS — BP 130/52 | HR 57 | Ht 65.0 in | Wt 191.4 lb

## 2012-04-20 DIAGNOSIS — Z954 Presence of other heart-valve replacement: Secondary | ICD-10-CM

## 2012-04-20 DIAGNOSIS — Z7901 Long term (current) use of anticoagulants: Secondary | ICD-10-CM | POA: Diagnosis not present

## 2012-04-20 DIAGNOSIS — I359 Nonrheumatic aortic valve disorder, unspecified: Secondary | ICD-10-CM | POA: Diagnosis not present

## 2012-04-20 DIAGNOSIS — I350 Nonrheumatic aortic (valve) stenosis: Secondary | ICD-10-CM

## 2012-04-20 DIAGNOSIS — R609 Edema, unspecified: Secondary | ICD-10-CM

## 2012-04-20 DIAGNOSIS — Z952 Presence of prosthetic heart valve: Secondary | ICD-10-CM

## 2012-04-20 DIAGNOSIS — E7800 Pure hypercholesterolemia, unspecified: Secondary | ICD-10-CM

## 2012-04-20 DIAGNOSIS — I10 Essential (primary) hypertension: Secondary | ICD-10-CM | POA: Diagnosis not present

## 2012-04-20 LAB — POCT INR: INR: 3.1

## 2012-04-20 NOTE — Progress Notes (Signed)
Patient ID: Juan Li, male   DOB: 12-Jan-1932, 78 y.o.   MRN: [id number] PCP: Dr. Leveda Anna  78 yo with history of AS s/p St Jude AVR presents for cardiology followup.  His valve was replaced in 1993.  Echo in 10/13 showed normal EF and a well-seated mechanical valve.  Cardiac cath in 1993 showed nonobstructive disease.  He has been started on Namendo and Aricept for some degree of dementia. From a cardiac standpoint, he seems to have been doing well. He had right TKR without complication in 10/13 and was in rehab for a short time afterwards.  He is now home.  He is still having pain and instability in the right knee and is walking with a cane.  He denies exertional dyspnea though he is not very active.  No chest pain.  No problems with bleeding on coumadin.  BP seems to be under good control.  Of note, HCTZ was recently stopped because of a gout flare.  He has some swelling in the ankles that seems worse to me but he has not really noticed it.  His weight, however, is down 6 lbs.   Patient tripped and fell while coming in the office this morning.  He abraded his hand but otherwise seems to have had no significant injury.   Labs (1/12): K 4.8, creatinine 1.3 Labs (12/12): creatinine 1.2, LDL 177 Labs (10/13): creatinine 1.6 => 1.1, LDL 135 Labs (12/13): LDL 74, HDL 30  PMH: 1. Dementia: On Aricept and Namenda.   2. Hyperlipidemia: myalgias with Vytorin, tolerating pravastatin 3. BPH 4. Osteoarthritis right knee, s/p TKR 10/13.  5. Cervical myelopathy  6. Obesity 7. HTN 8. Aortic stenosis: St. Jude mechanical aortic valve placed in 1993. Echo (9/11) with EF 55-60%, mild LV hypertrophy, normally functioning mechanical aortic valve, moderate diastolic dysfunction, normal RV.   9. Cardiac cath in 1993 with nonobstructive disease.  10. L-spine disease/spinal stenosis.  11. Abdominal ultrasound in 12/11 did not show AAA 12. Gout 13. Shoulder surgery 1/12 14. 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  Married.  Does not smoke.   FH: No premature CAD.    Current Outpatient Prescriptions  Medication Sig Dispense Refill  . acetaminophen (TYLENOL) 325 MG tablet Take 2 tablets (650 mg total) by mouth every 6 (six) hours as needed (or Fever >/= 101).  60 tablet  0  . amLODipine (NORVASC) 10 MG tablet Take 10 mg by mouth every morning.       Marland Kitchen aspirin EC 81 MG tablet Take 81 mg by mouth every morning.       . bisacodyl (DULCOLAX) 10 MG suppository Place 1 suppository (10 mg total) rectally daily as needed.  12 suppository  0  . docusate sodium 100 MG CAPS Take 100 mg by mouth 2 (two) times daily.  60 capsule  0  . donepezil (ARICEPT) 5 MG tablet Take 5 mg by mouth every morning.      . dutasteride (AVODART) 0.5 MG capsule Take 0.5 mg by mouth every evening.       . enalapril (VASOTEC) 20 MG tablet Take 20 mg by mouth every morning.      . hydrochlorothiazide (HYDRODIURIL) 12.5 MG tablet       . HYDROcodone-acetaminophen (VICODIN) 5-500 MG per tablet Take 1 tablet by mouth every 6 (six) hours as needed.      . memantine (NAMENDA) 5 MG tablet Take 5 mg by mouth 2 (two) times daily.      Marland Kitchen  methocarbamol (ROBAXIN) 500 MG tablet Take 1 tablet (500 mg total) by mouth every 6 (six) hours as needed.  80 tablet  0  . metoprolol (LOPRESSOR) 50 MG tablet Take 50 mg by mouth 2 (two) times daily.      Marland Kitchen omeprazole (PRILOSEC) 20 MG capsule Take 1 capsule (20 mg total) by mouth daily.  90 capsule  3  . ondansetron (ZOFRAN) 4 MG tablet Take 1 tablet (4 mg total) by mouth every 6 (six) hours as needed for nausea.  40 tablet  0  . polyethylene glycol (MIRALAX / GLYCOLAX) packet Take 17 g by mouth daily as needed.  14 each  0  . pravastatin (PRAVACHOL) 40 MG tablet Take 40 mg by mouth 2 (two) times daily.      . Tamsulosin HCl (FLOMAX) 0.4 MG CAPS Take 1 capsule (0.4 mg total) by mouth daily.  90 capsule  3  . warfarin (COUMADIN) 5 MG tablet Take 1 tablet (5 mg total) by mouth one time  only at 6 PM. Take Coumadin for three weeks for postoperative protocol and then the patient may resume their previous Coumadin home regimen.  The dose may need to be adjusted based upon the INR.  Please follow the INR and titrate Coumadin dose for a therapeutic range between 2.0 and 3.0 INR.  After completing the three weeks of Coumadin, the patient may resume their previous Coumadin home regimen.  30 tablet  0  . zoster vaccine live, PF, (ZOSTAVAX) [address] UNT/0.65ML injection Inject 19,400 Units into the skin once.  1 each  0    BP 130/52  Pulse 57  Ht 5\' 5"  (1.651 m)  Wt 191 lb 6.4 oz (86.818 kg)  BMI 31.85 kg/m2  SpO2 97% General: NAD, obese Neck: No JVD, no thyromegaly or thyroid nodule.  Lungs: Clear to auscultation bilaterally with normal respiratory effort. CV: Nondisplaced PMI.  Heart regular S1/S2, mechanical S2, no S3/S4, 1/6 SEM RUSB.  1+ edema 1/3 up lower legs bilaterally.  No carotid bruit.  Normal pedal pulses.  Abdomen: Soft, nontender, no hepatosplenomegaly, no distention.  Neurologic: Alert and oriented x 3.  Psych: Normal affect. Extremities: No clubbing or cyanosis.   Assessment/Plan:  Aortic valve disorders  Mechanical aortic valve. No significant exertional dyspnea or chest pain. No bleeding problems on coumadin. Continue ASA 81 also.   Valve appeared normal on 10/13 echo.   HYPERCHOLESTEROLEMIA  Tolerating pravastatin.  Good LDL recently.  HYPERTENSION BP seems to be well-controlled on current regimen.  He did, of note, recently stop HCTZ for gout.  Edema Patient has significant ankle edema.  Suspect this is increased since stopping HCTZ.  No JVD or exertional dyspnea and lungs clear, so suspect venous insufficiency is playing a role.  I asked him to cut back on sodium intake (1500 mg daily) and to wear support hose.  I do not think a diuretic is necessary at this point.  Will continue to monitor.   [name] 04/20/2012 10:35 AM

## 2012-04-20 NOTE — Patient Instructions (Addendum)
Use support stockings to help the swelling in your feet and legs. Put them on in the morning and take them off at night. I have given you a prescription for them.  Limit you sodium /salt  intake to 1500mg  daily.  Your physician wants you to follow-up in: 6 months with Dr Shirlee Latch. (June 2014). You will receive a reminder letter in the mail two months in advance. If you don't receive a letter, please call our office to schedule the follow-up appointment.

## 2012-04-28 ENCOUNTER — Other Ambulatory Visit: Payer: Self-pay | Admitting: Cardiology

## 2012-05-08 DIAGNOSIS — M1710 Unilateral primary osteoarthritis, unspecified knee: Secondary | ICD-10-CM | POA: Diagnosis not present

## 2012-05-11 ENCOUNTER — Ambulatory Visit (INDEPENDENT_AMBULATORY_CARE_PROVIDER_SITE_OTHER): Payer: Medicare Other | Admitting: *Deleted

## 2012-05-11 DIAGNOSIS — I359 Nonrheumatic aortic valve disorder, unspecified: Secondary | ICD-10-CM

## 2012-05-11 DIAGNOSIS — I350 Nonrheumatic aortic (valve) stenosis: Secondary | ICD-10-CM

## 2012-05-11 DIAGNOSIS — Z7901 Long term (current) use of anticoagulants: Secondary | ICD-10-CM

## 2012-05-11 DIAGNOSIS — M1710 Unilateral primary osteoarthritis, unspecified knee: Secondary | ICD-10-CM | POA: Diagnosis not present

## 2012-05-11 DIAGNOSIS — Z952 Presence of prosthetic heart valve: Secondary | ICD-10-CM

## 2012-05-11 DIAGNOSIS — Z954 Presence of other heart-valve replacement: Secondary | ICD-10-CM | POA: Diagnosis not present

## 2012-05-11 LAB — POCT INR: INR: 2.8

## 2012-05-15 DIAGNOSIS — M1710 Unilateral primary osteoarthritis, unspecified knee: Secondary | ICD-10-CM | POA: Diagnosis not present

## 2012-05-18 ENCOUNTER — Other Ambulatory Visit: Payer: Self-pay | Admitting: Family Medicine

## 2012-05-18 DIAGNOSIS — M1710 Unilateral primary osteoarthritis, unspecified knee: Secondary | ICD-10-CM | POA: Diagnosis not present

## 2012-05-22 DIAGNOSIS — M1710 Unilateral primary osteoarthritis, unspecified knee: Secondary | ICD-10-CM | POA: Diagnosis not present

## 2012-05-25 DIAGNOSIS — M1710 Unilateral primary osteoarthritis, unspecified knee: Secondary | ICD-10-CM | POA: Diagnosis not present

## 2012-05-29 DIAGNOSIS — M1710 Unilateral primary osteoarthritis, unspecified knee: Secondary | ICD-10-CM | POA: Diagnosis not present

## 2012-05-31 DIAGNOSIS — Z96659 Presence of unspecified artificial knee joint: Secondary | ICD-10-CM | POA: Diagnosis not present

## 2012-06-08 ENCOUNTER — Ambulatory Visit (INDEPENDENT_AMBULATORY_CARE_PROVIDER_SITE_OTHER): Payer: Medicare Other | Admitting: *Deleted

## 2012-06-08 DIAGNOSIS — Z7901 Long term (current) use of anticoagulants: Secondary | ICD-10-CM

## 2012-06-08 DIAGNOSIS — I350 Nonrheumatic aortic (valve) stenosis: Secondary | ICD-10-CM

## 2012-06-08 DIAGNOSIS — Z952 Presence of prosthetic heart valve: Secondary | ICD-10-CM

## 2012-06-08 DIAGNOSIS — I359 Nonrheumatic aortic valve disorder, unspecified: Secondary | ICD-10-CM

## 2012-06-08 DIAGNOSIS — Z954 Presence of other heart-valve replacement: Secondary | ICD-10-CM | POA: Diagnosis not present

## 2012-06-12 ENCOUNTER — Other Ambulatory Visit: Payer: Self-pay | Admitting: Family Medicine

## 2012-06-22 ENCOUNTER — Ambulatory Visit (INDEPENDENT_AMBULATORY_CARE_PROVIDER_SITE_OTHER): Payer: Medicare Other | Admitting: *Deleted

## 2012-06-22 LAB — POCT INR: INR: 2.8

## 2012-07-13 ENCOUNTER — Ambulatory Visit (INDEPENDENT_AMBULATORY_CARE_PROVIDER_SITE_OTHER): Payer: Medicare Other | Admitting: *Deleted

## 2012-07-13 DIAGNOSIS — Z7901 Long term (current) use of anticoagulants: Secondary | ICD-10-CM | POA: Diagnosis not present

## 2012-07-13 LAB — POCT INR: INR: 3.6

## 2012-07-21 DIAGNOSIS — S6990XA Unspecified injury of unspecified wrist, hand and finger(s), initial encounter: Secondary | ICD-10-CM | POA: Diagnosis not present

## 2012-07-21 DIAGNOSIS — S62309A Unspecified fracture of unspecified metacarpal bone, initial encounter for closed fracture: Secondary | ICD-10-CM | POA: Diagnosis not present

## 2012-07-21 DIAGNOSIS — S63269A Dislocation of metacarpophalangeal joint of unspecified finger, initial encounter: Secondary | ICD-10-CM | POA: Diagnosis not present

## 2012-07-21 DIAGNOSIS — S62319A Displaced fracture of base of unspecified metacarpal bone, initial encounter for closed fracture: Secondary | ICD-10-CM | POA: Diagnosis not present

## 2012-07-21 DIAGNOSIS — R296 Repeated falls: Secondary | ICD-10-CM | POA: Diagnosis not present

## 2012-07-21 DIAGNOSIS — M7989 Other specified soft tissue disorders: Secondary | ICD-10-CM | POA: Diagnosis not present

## 2012-07-21 DIAGNOSIS — M79609 Pain in unspecified limb: Secondary | ICD-10-CM | POA: Diagnosis not present

## 2012-07-21 DIAGNOSIS — S6980XA Other specified injuries of unspecified wrist, hand and finger(s), initial encounter: Secondary | ICD-10-CM | POA: Diagnosis not present

## 2012-07-22 DIAGNOSIS — S6990XA Unspecified injury of unspecified wrist, hand and finger(s), initial encounter: Secondary | ICD-10-CM | POA: Diagnosis not present

## 2012-07-22 DIAGNOSIS — S62309A Unspecified fracture of unspecified metacarpal bone, initial encounter for closed fracture: Secondary | ICD-10-CM | POA: Diagnosis not present

## 2012-07-22 DIAGNOSIS — M79609 Pain in unspecified limb: Secondary | ICD-10-CM | POA: Diagnosis not present

## 2012-07-24 DIAGNOSIS — S62309A Unspecified fracture of unspecified metacarpal bone, initial encounter for closed fracture: Secondary | ICD-10-CM | POA: Diagnosis not present

## 2012-07-29 ENCOUNTER — Other Ambulatory Visit: Payer: Self-pay | Admitting: Family Medicine

## 2012-07-30 ENCOUNTER — Other Ambulatory Visit: Payer: Self-pay | Admitting: Cardiology

## 2012-07-31 DIAGNOSIS — S62309A Unspecified fracture of unspecified metacarpal bone, initial encounter for closed fracture: Secondary | ICD-10-CM | POA: Diagnosis not present

## 2012-08-10 ENCOUNTER — Ambulatory Visit (INDEPENDENT_AMBULATORY_CARE_PROVIDER_SITE_OTHER): Payer: Medicare Other | Admitting: Pharmacist

## 2012-08-10 DIAGNOSIS — Z954 Presence of other heart-valve replacement: Secondary | ICD-10-CM | POA: Diagnosis not present

## 2012-08-10 DIAGNOSIS — Z952 Presence of prosthetic heart valve: Secondary | ICD-10-CM

## 2012-08-10 DIAGNOSIS — I350 Nonrheumatic aortic (valve) stenosis: Secondary | ICD-10-CM

## 2012-08-10 DIAGNOSIS — I359 Nonrheumatic aortic valve disorder, unspecified: Secondary | ICD-10-CM | POA: Diagnosis not present

## 2012-08-10 DIAGNOSIS — Z7901 Long term (current) use of anticoagulants: Secondary | ICD-10-CM | POA: Diagnosis not present

## 2012-08-14 ENCOUNTER — Telehealth: Payer: Self-pay | Admitting: Cardiology

## 2012-08-14 DIAGNOSIS — S62309A Unspecified fracture of unspecified metacarpal bone, initial encounter for closed fracture: Secondary | ICD-10-CM | POA: Diagnosis not present

## 2012-08-15 ENCOUNTER — Ambulatory Visit (INDEPENDENT_AMBULATORY_CARE_PROVIDER_SITE_OTHER): Payer: Medicare Other | Admitting: Cardiology

## 2012-08-15 ENCOUNTER — Ambulatory Visit (INDEPENDENT_AMBULATORY_CARE_PROVIDER_SITE_OTHER)
Admission: RE | Admit: 2012-08-15 | Discharge: 2012-08-15 | Disposition: A | Discharge status: Home / Self Care | Payer: Medicare Other | Source: Ambulatory Visit | Attending: Cardiology | Admitting: Cardiology

## 2012-08-15 ENCOUNTER — Encounter: Payer: Self-pay | Admitting: Cardiology

## 2012-08-15 VITALS — BP 158/60 | HR 64 | Ht 67.0 in | Wt 205.0 lb

## 2012-08-15 DIAGNOSIS — Z952 Presence of prosthetic heart valve: Secondary | ICD-10-CM

## 2012-08-15 DIAGNOSIS — I359 Nonrheumatic aortic valve disorder, unspecified: Secondary | ICD-10-CM

## 2012-08-15 DIAGNOSIS — R0989 Other specified symptoms and signs involving the circulatory and respiratory systems: Secondary | ICD-10-CM

## 2012-08-15 DIAGNOSIS — R0600 Dyspnea, unspecified: Secondary | ICD-10-CM | POA: Insufficient documentation

## 2012-08-15 DIAGNOSIS — R0602 Shortness of breath: Secondary | ICD-10-CM | POA: Diagnosis not present

## 2012-08-15 DIAGNOSIS — Z954 Presence of other heart-valve replacement: Secondary | ICD-10-CM | POA: Diagnosis not present

## 2012-08-15 DIAGNOSIS — F17200 Nicotine dependence, unspecified, uncomplicated: Secondary | ICD-10-CM | POA: Diagnosis not present

## 2012-08-15 DIAGNOSIS — I10 Essential (primary) hypertension: Secondary | ICD-10-CM | POA: Diagnosis not present

## 2012-08-15 DIAGNOSIS — J40 Bronchitis, not specified as acute or chronic: Secondary | ICD-10-CM | POA: Diagnosis not present

## 2012-08-15 LAB — CBC WITH DIFFERENTIAL/PLATELET
Basophils Absolute: 0 10*3/uL (ref 0.0–0.1)
Eosinophils Absolute: 0.2 10*3/uL (ref 0.0–0.7)
Lymphocytes Relative: 25.8 % (ref 12.0–46.0)
MCHC: 32.7 g/dL (ref 30.0–36.0)
Neutrophils Relative %: 61 % (ref 43.0–77.0)
RDW: 15.7 % — ABNORMAL HIGH (ref 11.5–14.6)

## 2012-08-15 LAB — TSH: TSH: 1.67 u[IU]/mL (ref 0.35–5.50)

## 2012-08-15 LAB — BASIC METABOLIC PANEL
Chloride: 108 mEq/L (ref 96–112)
Potassium: 4.6 mEq/L (ref 3.5–5.1)

## 2012-08-15 MED ORDER — FUROSEMIDE 20 MG PO TABS: ORAL_TABLET | ORAL | Status: DC

## 2012-08-15 NOTE — Assessment & Plan Note (Signed)
We will check some labs, and have him see Dr. Shirlee Latch early next week.  He looks as though he could be mildly volume overloaded.  We will get a CXR today and start furosemide at 20mg  per day for three days only, then resume HCTZ which Dr. Shirlee Latch had him stop.  His weight is up fifteen pounds from the fall, and he has some LE edema.  In addition, his BP is up from before.  We will get additional labs and have him seen in early fu.  He will call if there is any change in status.

## 2012-08-15 NOTE — Patient Instructions (Addendum)
Your physician recommends that you schedule a follow-up appointment next week with Dr. Shirlee Latch  Your physician has recommended you make the following change in your medication:  Stop taking hydrochlorothiazide for 3 days.  Take furosemide 20 mg by mouth daily for 3 days.  Weigh yourself every morning and record weights.  Bring these readings to appointment with Dr.McLean  Have chest X-ray done today at [facility] office on [facility].

## 2012-08-15 NOTE — Assessment & Plan Note (Signed)
The valve sounds do no sound muffled, and do sound crisp.  He is on warfarin.

## 2012-08-15 NOTE — Progress Notes (Signed)
HPI:  This patient is a former patient of Dr. Regino Schultze. The patient was seen at the orthopedic office, and was being evaluated for a problem with his hand. He became short of breath, and there was some concern over his symptoms. They arranged for him to be seen here today. Overall he feels pretty good. He's not had a recurrence that he had at that time.  However he has gained quite a bit of weight. He does feel a little bit bloated.  The patient has had prior AVR.  At the time of his cath, per Dr. Juanda Chance note (no cath report available), he had non obstructive disease.  He specifically denies any chest pain.  He has gained some weight recently, and though he does not add salt, he is not certain that his salt intake has not been increased.    Current Outpatient Prescriptions  Medication Sig Dispense Refill  . acetaminophen (TYLENOL) 325 MG tablet Take 2 tablets (650 mg total) by mouth every 6 (six) hours as needed (or Fever >/= 101).  60 tablet  0  . amLODipine (NORVASC) 10 MG tablet Take 10 mg by mouth every morning.       Marland Kitchen aspirin EC 81 MG tablet Take 81 mg by mouth every morning.       . bisacodyl (DULCOLAX) 10 MG suppository Place 1 suppository (10 mg total) rectally daily as needed.  12 suppository  0  . docusate sodium 100 MG CAPS Take 100 mg by mouth 2 (two) times daily.  60 capsule  0  . donepezil (ARICEPT) 5 MG tablet TAKE 1 TABLET BY MOUTH EVERY DAY  90 tablet  3  . dutasteride (AVODART) 0.5 MG capsule Take 0.5 mg by mouth every evening.       . enalapril (VASOTEC) 20 MG tablet TAKE 1 TABLET TWICE A DAY  180 tablet  3  . hydrochlorothiazide (HYDRODIURIL) 12.5 MG tablet       . HYDROcodone-acetaminophen (VICODIN) 5-500 MG per tablet Take 1 tablet by mouth every 6 (six) hours as needed.      . memantine (NAMENDA) 5 MG tablet Take 5 mg by mouth 2 (two) times daily.      . methocarbamol (ROBAXIN) 500 MG tablet Take 1 tablet (500 mg total) by mouth every 6 (six) hours as needed.  80 tablet  0   . metoprolol (LOPRESSOR) 50 MG tablet Take 50 mg by mouth 2 (two) times daily.      Marland Kitchen omeprazole (PRILOSEC) 20 MG capsule Take 1 capsule (20 mg total) by mouth daily.  90 capsule  3  . ondansetron (ZOFRAN) 4 MG tablet Take 1 tablet (4 mg total) by mouth every 6 (six) hours as needed for nausea.  40 tablet  0  . polyethylene glycol (MIRALAX / GLYCOLAX) packet Take 17 g by mouth daily as needed.  14 each  0  . pravastatin (PRAVACHOL) 40 MG tablet Take 2 tablets (80 mg total) by mouth daily.  60 tablet  2  . Tamsulosin HCl (FLOMAX) 0.4 MG CAPS Take 1 capsule (0.4 mg total) by mouth daily.  90 capsule  3  . warfarin (COUMADIN) 5 MG tablet Take 1 tablet (5 mg total) by mouth one time only at 6 PM. Take Coumadin for three weeks for postoperative protocol and then the patient may resume their previous Coumadin home regimen.  The dose may need to be adjusted based upon the INR.  Please follow the INR and titrate Coumadin dose for a  therapeutic range between 2.0 and 3.0 INR.  After completing the three weeks of Coumadin, the patient may resume their previous Coumadin home regimen.  30 tablet  0  . zoster vaccine live, PF, (ZOSTAVAX) [address] UNT/0.65ML injection Inject 19,400 Units into the skin once.  1 each  0  . furosemide (LASIX) 20 MG tablet Take one tablet by mouth daily for 3 days  3 tablet  0   No current facility-administered medications for this visit.    No Known Allergies  Past Medical History  Diagnosis Date  . Heart murmur   . Complication of anesthesia 02-15-12    very confused, and very slow to awaken after anesthesia  . Coronary artery disease 02-15-12    Heart valve replaced   . Hypercholesterolemia 02-15-12  . Hypertension   . Arthritis 02-15-12    osteoarthritis,right knee, pain right wrist.,    Past Surgical History  Procedure Laterality Date  . Prostate surgery    . Cardiac valve replacement    . Shoulder open rotator cuff repair  02-15-12    left  . Total knee  arthroplasty  02/20/2012    Procedure: TOTAL KNEE ARTHROPLASTY;  Surgeon: Loanne Drilling, MD;  Location: [facility];  Service: Orthopedics;  Laterality: Right;    Family History  Problem Relation Age of Onset  . Heart disease Mother     History   Social History  . Marital Status: Married    Spouse Name: [place]    Number of Children: 3  . Years of Education: N/A   Occupational History  . 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 Smoking status: Former Smoker    Quit date: 08/10/1995  . Smokeless tobacco: Never Used  . Alcohol Use: Yes     Comment: 1-2x a year  . Drug Use: No  . Sexually Active: Yes   Other Topics Concern  . Not on file   Social History Narrative   Health Care POA: Wife, [place]   Emergency Contact: wife, [place], [phone] (c)   End of Life Plan:   Who lives with you: Patient lives with wife.   Any pets:    Diet: Patient has a varied diet of protein, starch, and vegetables.   Exercise: Patient does not have a regular exercise plan, afraid of falling.   Seatbelts: Patient reports wearing seatbelt when in vehicle.    Sun Exposure/Protection: Patient does not wear sun protection.   Hobbies: Patient likes to do yard work.          ROS: Please see the HPI.  All other systems reviewed and negative.  PHYSICAL EXAM:  BP 158/60  Pulse 64  Ht 5\' 7"  (1.702 m)  Wt 205 lb (92.987 kg)  BMI 32.1 kg/m2  SpO2 98%  General: Well developed, well nourished, in no acute distress. Head:  Normocephalic and atraumatic. Neck: no JVD Lungs: Clear to auscultation and percussion. Heart: Normal S1 and S2.  Crisp VS.  No DM.   Abdomen:  Normal bowel sounds; soft; non tender; no organomegaly Pulses: Pulses normal in all 4 extremities. Extremities: No clubbing or cyanosis. Bilateral pitting edema.   Neurologic: Alert and oriented x 3.  EKG:  NSR.  LVH with repolarization abnormality.  Compared to prior tracings, ST and T  changes are more prominent in the lateral leads.    ECHO 10/13  Study Conclusions  - Left ventricle: The cavity size was normal. Wall thickness was  normal. Systolic function was vigorous. The estimated ejection fraction was in the range of 65% to 70%. - Aortic valve: Moderately calcified annulus. There was mild stenosis. Trivial regurgitation. - Mitral valve: Moderately calcified annulus. - Left atrium: The atrium was moderately dilated. - Right atrium: The atrium was moderately dilated.    ASSESSMENT AND PLAN:  Patient has had shortness of breath.  His ECG shows some T changes, but in setting of 15 lb weight gain, rise in BP, ? Fluid overload.  We will adjust his meds and get him back for follow up early.    1.  Furosemide times three days. 2.  Early fu with Dr. Shirlee Latch 3.  Repeat labs. 4.  May need repeat echo study to assess valve, but sounds are crisp on exam.   5.  Repeat ECG when he returns.

## 2012-08-21 ENCOUNTER — Ambulatory Visit (INDEPENDENT_AMBULATORY_CARE_PROVIDER_SITE_OTHER): Payer: Medicare Other | Admitting: Cardiology

## 2012-08-21 ENCOUNTER — Other Ambulatory Visit: Payer: Self-pay | Admitting: Cardiology

## 2012-08-21 ENCOUNTER — Encounter: Payer: Self-pay | Admitting: Cardiology

## 2012-08-21 VITALS — BP 150/60 | HR 60 | Ht 67.0 in | Wt 202.4 lb

## 2012-08-21 DIAGNOSIS — I509 Heart failure, unspecified: Secondary | ICD-10-CM

## 2012-08-21 DIAGNOSIS — R0989 Other specified symptoms and signs involving the circulatory and respiratory systems: Secondary | ICD-10-CM | POA: Diagnosis not present

## 2012-08-21 DIAGNOSIS — Z9889 Other specified postprocedural states: Secondary | ICD-10-CM | POA: Diagnosis not present

## 2012-08-21 DIAGNOSIS — I5032 Chronic diastolic (congestive) heart failure: Secondary | ICD-10-CM

## 2012-08-21 DIAGNOSIS — E7800 Pure hypercholesterolemia, unspecified: Secondary | ICD-10-CM | POA: Diagnosis not present

## 2012-08-21 DIAGNOSIS — R0609 Other forms of dyspnea: Secondary | ICD-10-CM

## 2012-08-21 DIAGNOSIS — I359 Nonrheumatic aortic valve disorder, unspecified: Secondary | ICD-10-CM | POA: Diagnosis not present

## 2012-08-21 HISTORY — DX: Chronic diastolic (congestive) heart failure: I50.32

## 2012-08-21 MED ORDER — FUROSEMIDE 40 MG PO TABS: 40.0000 mg | ORAL_TABLET | Freq: Every day | ORAL | Status: DC

## 2012-08-21 MED ORDER — POTASSIUM CHLORIDE CRYS ER 10 MEQ PO TBCR: 10.0000 meq | EXTENDED_RELEASE_TABLET | Freq: Every day | ORAL | Status: DC

## 2012-08-21 NOTE — Progress Notes (Signed)
Patient ID: Juan Li, male   DOB: [birth year], 79 y.o.   MRN: [id number] PCP: Dr. Leveda Anna  79 yo with history of AS s/p St Jude AVR and chronic diastolic CHF presents for cardiology followup.  His valve was replaced in 1993.  Echo in 10/13 showed normal EF and a well-seated mechanical valve.  Cardiac cath in 1993 showed nonobstructive disease.  He has been started on Namendo and Aricept for some degree of dementia.   He was seen by Dr. Riley Kill last week with increased exertional dyspnea and leg swelling.  CXR did not show overt pulmonary edema.  He was given Lasix 20 mg daily x 3 days.  His weight is up 11 lbs compared to last appointment with me but is actually down 3 lbs compared to appointment last week with Dr. Riley Kill.  He does not notice much difference from the three days of Lasix.  His wife, however, thinks that his breathing is less heavy.  He is short of breath now after walking about 50-100 feet.  He continues to have some gait instability.  No recent falls.  No orthopnea, PND, or chest pain.   Labs (1/12): K 4.8, creatinine 1.3 Labs (12/12): creatinine 1.2, LDL 177 Labs (10/13): creatinine 1.6 => 1.1, LDL 135 Labs (12/13): LDL 74, HDL 30 Labs (4/14): K 4.6, creatinine 1.1, TSH normal, HCT 37.6  PMH: 1. Dementia: On Aricept and Namenda.   2. Hyperlipidemia: myalgias with Vytorin, tolerating pravastatin 3. BPH 4. Osteoarthritis right knee, s/p TKR 10/13.  5. Cervical myelopathy  6. Obesity 7. HTN 8. Aortic stenosis: St. Jude mechanical aortic valve placed in 1993. Echo (10/13) with EF 65-70% and normal function of the aortic valve (mean gradient 12 mmHg).   9. Cardiac cath in 1993 with nonobstructive disease.  10. L-spine disease/spinal stenosis.  11. Abdominal ultrasound in 12/11 did not show AAA 12. Gout 13. Shoulder surgery 1/12 14. CKD 15. Chronid diastolic CHF: Echo (10/13) with EF 65-70%, normally-functioning mechanical aortic valve (mean gradient 12).    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  Married.  Does not smoke.   FH: No premature CAD.   ROS: All systems reviewed and negative except as per HPI.    Current Outpatient Prescriptions  Medication Sig Dispense Refill  . amLODipine (NORVASC) 10 MG tablet Take 10 mg by mouth every morning.       Marland Kitchen aspirin EC 81 MG tablet Take 81 mg by mouth every morning.       . donepezil (ARICEPT) 5 MG tablet TAKE 1 TABLET BY MOUTH EVERY DAY  90 tablet  3  . dutasteride (AVODART) 0.5 MG capsule Take 0.5 mg by mouth every evening.       . enalapril (VASOTEC) 20 MG tablet TAKE 1 TABLET TWICE A DAY  180 tablet  3  . HYDROcodone-acetaminophen (VICODIN) 5-500 MG per tablet Take 1 tablet by mouth every 6 (six) hours as needed.      . memantine (NAMENDA) 5 MG tablet Take 5 mg by mouth 2 (two) times daily.      . metoprolol (LOPRESSOR) 50 MG tablet Take 50 mg by mouth 2 (two) times daily.      Marland Kitchen omeprazole (PRILOSEC) 20 MG capsule Take 1 capsule (20 mg total) by mouth daily.  90 capsule  3  . pravastatin (PRAVACHOL) 40 MG tablet Take 2 tablets (80 mg total) by mouth daily.  60 tablet  2  . Tamsulosin HCl (FLOMAX) 0.4 MG CAPS Take  1 capsule (0.4 mg total) by mouth daily.  90 capsule  3  . warfarin (COUMADIN) 5 MG tablet Take 1 tablet (5 mg total) by mouth one time only at 6 PM. Take Coumadin for three weeks for postoperative protocol and then the patient may resume their previous Coumadin home regimen.  The dose may need to be adjusted based upon the INR.  Please follow the INR and titrate Coumadin dose for a therapeutic range between 2.0 and 3.0 INR.  After completing the three weeks of Coumadin, the patient may resume their previous Coumadin home regimen.  30 tablet  0  . furosemide (LASIX) 40 MG tablet Take 1 tablet (40 mg total) by mouth daily.  30 tablet  6  . potassium chloride (K-DUR,KLOR-CON) 10 MEQ tablet Take 1 tablet (10 mEq total) by mouth daily.  30 tablet  6  . pravastatin (PRAVACHOL) 40 MG tablet TAKE 1  TABLET EVERY EVENING  30 tablet  5   No current facility-administered medications for this visit.    BP 150/60  Pulse 60  Ht 5\' 7"  (1.702 m)  Wt 202 lb 6.4 oz (91.808 kg)  BMI 31.69 kg/m2  SpO2 96% General: NAD, obese Neck: JVP 10 cm, no thyromegaly or thyroid nodule.  Lungs: Clear to auscultation bilaterally with normal respiratory effort. CV: Nondisplaced PMI.  Heart regular S1/S2, mechanical S2, no S3/S4, 1/6 SEM RUSB.  2+ edema to knees bilaterally.  No carotid bruit.  Normal pedal pulses.  Abdomen: Soft, nontender, no hepatosplenomegaly, no distention.  Neurologic: Alert and oriented x 3.  Psych: Normal affect. Extremities: No clubbing or cyanosis.   Assessment/Plan:  Aortic valve disorders  Mechanical aortic valve.  No bleeding problems on coumadin. Continue ASA 81 also.   Valve appeared normal on 10/13 echo.  Crisp S2 on exam today.  HYPERCHOLESTEROLEMIA  Tolerating pravastatin.  Good LDL in 12/13.  HYPERTENSION BP mildly elevated.  He will continue current regimen except will add Lasix.  Chronic diastolic CHF Patient is volume overloaded on exam.  Though he has lost weight since appt last week in this office, his weight is still 11 lbs up compared to the last time I saw him.  NYHA class III symptoms probably.  - Start Lasix 40 mg daily - Start KCl 10 mEq daily - Needs to watch sodium intake more closely.  - BMET/BNP today and in 14 days with followup with me in 14 days.   [name] 08/21/2012 9:47 PM

## 2012-08-21 NOTE — Patient Instructions (Addendum)
Take lasix (furosemide) 40mg  daily.   Take KCL (potassium) 10 mEq daily.   Your physician recommends that you have lab work today--BMET/BNP.   You have  a follow-up appointment with Dr Shirlee Latch on Monday May 5,2014 at 9:45AM.  Your physician recommends that you return for lab work in 2 weeks when you see Dr McLean--BMET/BNP.

## 2012-08-22 LAB — BASIC METABOLIC PANEL WITH GFR
BUN: 22 mg/dL (ref 6–23)
CO2: 24 meq/L (ref 19–32)
Calcium: 8.9 mg/dL (ref 8.4–10.5)
Chloride: 108 meq/L (ref 96–112)
Creatinine, Ser: 1.3 mg/dL (ref 0.4–1.5)
GFR: 55.17 mL/min — ABNORMAL LOW
Glucose, Bld: 92 mg/dL (ref 70–99)
Potassium: 4.8 meq/L (ref 3.5–5.1)
Sodium: 137 meq/L (ref 135–145)

## 2012-08-22 LAB — BRAIN NATRIURETIC PEPTIDE: Pro B Natriuretic peptide (BNP): 180 pg/mL — ABNORMAL HIGH (ref 0.0–100.0)

## 2012-08-23 ENCOUNTER — Other Ambulatory Visit: Payer: Self-pay | Admitting: Family Medicine

## 2012-08-23 DIAGNOSIS — M159 Polyosteoarthritis, unspecified: Secondary | ICD-10-CM

## 2012-08-23 MED ORDER — HYDROCODONE-ACETAMINOPHEN 10-325 MG PO TABS: 1.0000 | ORAL_TABLET | Freq: Four times a day (QID) | ORAL | Status: DC | PRN

## 2012-08-23 NOTE — Assessment & Plan Note (Signed)
Received fax request to refill hydrocodone 10/500.  Called patient and verified dose - he takes about two tabs per day on average.  Will switch to 10/325 since 500 of acetomenophen no longer available.

## 2012-08-28 DIAGNOSIS — Z96659 Presence of unspecified artificial knee joint: Secondary | ICD-10-CM | POA: Diagnosis not present

## 2012-09-03 ENCOUNTER — Encounter: Payer: Self-pay | Admitting: Cardiology

## 2012-09-03 ENCOUNTER — Ambulatory Visit (INDEPENDENT_AMBULATORY_CARE_PROVIDER_SITE_OTHER): Payer: Medicare Other | Admitting: Cardiology

## 2012-09-03 ENCOUNTER — Other Ambulatory Visit (INDEPENDENT_AMBULATORY_CARE_PROVIDER_SITE_OTHER): Payer: Medicare Other

## 2012-09-03 ENCOUNTER — Ambulatory Visit (INDEPENDENT_AMBULATORY_CARE_PROVIDER_SITE_OTHER): Payer: Medicare Other

## 2012-09-03 VITALS — BP 130/58 | HR 52 | Ht 67.0 in | Wt 195.0 lb

## 2012-09-03 DIAGNOSIS — Z7901 Long term (current) use of anticoagulants: Secondary | ICD-10-CM

## 2012-09-03 DIAGNOSIS — Z954 Presence of other heart-valve replacement: Secondary | ICD-10-CM | POA: Diagnosis not present

## 2012-09-03 DIAGNOSIS — I359 Nonrheumatic aortic valve disorder, unspecified: Secondary | ICD-10-CM

## 2012-09-03 DIAGNOSIS — R0989 Other specified symptoms and signs involving the circulatory and respiratory systems: Secondary | ICD-10-CM

## 2012-09-03 DIAGNOSIS — I5032 Chronic diastolic (congestive) heart failure: Secondary | ICD-10-CM

## 2012-09-03 DIAGNOSIS — I509 Heart failure, unspecified: Secondary | ICD-10-CM

## 2012-09-03 DIAGNOSIS — E7800 Pure hypercholesterolemia, unspecified: Secondary | ICD-10-CM | POA: Diagnosis not present

## 2012-09-03 DIAGNOSIS — R0609 Other forms of dyspnea: Secondary | ICD-10-CM

## 2012-09-03 LAB — BASIC METABOLIC PANEL
CO2: 24 mEq/L (ref 19–32)
Calcium: 9.3 mg/dL (ref 8.4–10.5)
Chloride: 104 mEq/L (ref 96–112)
Sodium: 137 mEq/L (ref 135–145)

## 2012-09-03 LAB — BRAIN NATRIURETIC PEPTIDE: Pro B Natriuretic peptide (BNP): 134 pg/mL — ABNORMAL HIGH (ref 0.0–100.0)

## 2012-09-03 NOTE — Progress Notes (Signed)
Patient ID: Juan Li, male   DOB: 1931/09/23, 79 y.o.   MRN: [id number] PCP: Dr. Leveda Anna  79 yo with history of AS s/p St Jude AVR and chronic diastolic CHF presents for cardiology followup.  His valve was replaced in 1993.  Echo in 10/13 showed normal EF and a well-seated mechanical valve.  Cardiac cath in 1993 showed nonobstructive disease.  He has been started on Namendo and Aricept for some degree of dementia.  At last appointment, he was volume overloaded.  I started him on Lasix, and weight is down 7 lbs.  He is breathing better and is able to walk 50-75 yards without dyspnea.  He continues to have some gait instability.  No recent falls.  No orthopnea, PND, or chest pain.  His lower extremity edema is still present but is improved.    Labs (1/12): K 4.8, creatinine 1.3 Labs (12/12): creatinine 1.2, LDL 177 Labs (10/13): creatinine 1.6 => 1.1, LDL 135 Labs (12/13): LDL 74, HDL 30 Labs (4/14): K 4.6 => 4.8, creatinine 1.1 => 1.3, TSH normal, HCT 37.6, BNP 180  PMH: 1. Dementia: On Aricept and Namenda.   2. Hyperlipidemia: myalgias with Vytorin, tolerating pravastatin 3. BPH 4. Osteoarthritis right knee, s/p TKR 10/13.  5. Cervical myelopathy  6. Obesity 7. HTN 8. Aortic stenosis: St. Jude mechanical aortic valve placed in 1993. Echo (10/13) with EF 65-70% and normal function of the aortic valve (mean gradient 12 mmHg).   9. Cardiac cath in 1993 with nonobstructive disease.  10. L-spine disease/spinal stenosis.  11. Abdominal ultrasound in 12/11 did not show AAA 12. Gout 13. Shoulder surgery 1/12 14. CKD 15. Chronid diastolic CHF: Echo (10/13) with EF 65-70%, normally-functioning mechanical aortic valve (mean gradient 12).    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   FH: No premature CAD.    Current Outpatient Prescriptions  Medication Sig Dispense Refill  . amLODipine (NORVASC) 10 MG tablet Take 10 mg by mouth every morning.       Marland Kitchen  aspirin EC 81 MG tablet Take 81 mg by mouth every morning.       . donepezil (ARICEPT) 5 MG tablet TAKE 1 TABLET BY MOUTH EVERY DAY  90 tablet  3  . dutasteride (AVODART) 0.5 MG capsule Take 0.5 mg by mouth every evening.       . enalapril (VASOTEC) 20 MG tablet TAKE 1 TABLET TWICE A DAY  180 tablet  3  . furosemide (LASIX) 40 MG tablet Take 1 tablet (40 mg total) by mouth daily.  30 tablet  6  . HYDROcodone-acetaminophen (NORCO) 10-325 MG per tablet Take 1 tablet by mouth every 6 (six) hours as needed for pain.  360 tablet  3  . KLOR-CON 10 10 MEQ tablet Take 1 tablet by mouth daily.      . memantine (NAMENDA) 5 MG tablet Take 5 mg by mouth 2 (two) times daily.      . metoprolol (LOPRESSOR) 50 MG tablet Take 50 mg by mouth 2 (two) times daily.      Marland Kitchen omeprazole (PRILOSEC) 20 MG capsule Take 1 capsule (20 mg total) by mouth daily.  90 capsule  3  . pravastatin (PRAVACHOL) 40 MG tablet Take 2 tablets (80 mg total) by mouth daily.  60 tablet  2  . Tamsulosin HCl (FLOMAX) 0.4 MG CAPS Take 1 capsule (0.4 mg total) by mouth daily.  90 capsule  3  . warfarin (COUMADIN) 5  MG tablet Take 1 tablet (5 mg total) by mouth one time only at 6 PM. Take Coumadin for three weeks for postoperative protocol and then the patient may resume their previous Coumadin home regimen.  The dose may need to be adjusted based upon the INR.  Please follow the INR and titrate Coumadin dose for a therapeutic range between 2.0 and 3.0 INR.  After completing the three weeks of Coumadin, the patient may resume their previous Coumadin home regimen.  30 tablet  0   No current facility-administered medications for this visit.    BP 130/58  Pulse 52  Ht 5\' 7"  (1.702 m)  Wt 195 lb (88.451 kg)  BMI 30.53 kg/m2  SpO2 98% General: NAD, obese Neck: JVP not elevated, no thyromegaly or thyroid nodule.  Lungs: Clear to auscultation bilaterally with normal respiratory effort. CV: Nondisplaced PMI.  Heart regular S1/S2, mechanical S2, no  S3/S4, 1/6 SEM RUSB.  1+ edema 1/2 up lower legs bilaterally.  No carotid bruit.  Normal pedal pulses.  Abdomen: Soft, nontender, no hepatosplenomegaly, no distention.  Neurologic: Alert and oriented x 3.  Psych: Normal affect. Extremities: No clubbing or cyanosis.   Assessment/Plan:  Aortic valve disorders  Mechanical aortic valve.  No bleeding problems on coumadin. Continue ASA 81 also.   Valve appeared normal on 10/13 echo.  Crisp S2 on exam today.  HYPERCHOLESTEROLEMIA  Tolerating pravastatin.  Good LDL in 12/13.  HYPERTENSION BP not elevated. Chronic diastolic CHF Volume status much improved on Lasix.  NYHA class II-III symptoms.  Will check BMET and BNP today.    [name] 09/03/2012 10:03 PM

## 2012-09-03 NOTE — Patient Instructions (Addendum)
Your physician recommends that you have lab work today--BMET/BNP.  Your physician recommends that you schedule a follow-up appointment in: 3 months with Dr McLean.     

## 2012-09-04 ENCOUNTER — Other Ambulatory Visit: Payer: Self-pay | Admitting: *Deleted

## 2012-09-04 DIAGNOSIS — I5032 Chronic diastolic (congestive) heart failure: Secondary | ICD-10-CM

## 2012-09-18 DIAGNOSIS — M5137 Other intervertebral disc degeneration, lumbosacral region: Secondary | ICD-10-CM | POA: Diagnosis not present

## 2012-09-18 DIAGNOSIS — M4310 Spondylolisthesis, site unspecified: Secondary | ICD-10-CM | POA: Diagnosis not present

## 2012-09-19 ENCOUNTER — Telehealth: Payer: Self-pay | Admitting: Cardiology

## 2012-09-19 NOTE — Telephone Encounter (Signed)
Pt is aware he will need lovenox bridge while off coumadin. He is aware I am forwarding this to [facility] to help arrange this.

## 2012-09-19 NOTE — Telephone Encounter (Signed)
Spoke with patient. Pt states Dr Ethelene Hal wants him to hold coumadin for 5 days prior to lumbar spinal injection. Will forward to Dr Shirlee Latch

## 2012-09-19 NOTE — Telephone Encounter (Signed)
Does he have to hold coumadin?

## 2012-09-19 NOTE — Telephone Encounter (Signed)
Given history of CHF, should probably be bridged with Lovenox.

## 2012-09-19 NOTE — Telephone Encounter (Signed)
Spoke with patient.

## 2012-09-19 NOTE — Telephone Encounter (Signed)
New problem   Pt has question regarding orthopedics

## 2012-09-19 NOTE — Telephone Encounter (Signed)
Spoke with patient. Pt states Dr Ethelene Hal is requesting OK from Dr Shirlee Latch to have spinal injections for treatment of back pain. I will forward to Dr Shirlee Latch for review.

## 2012-09-19 NOTE — Telephone Encounter (Signed)
Follow Up     Following up on OK for spinal injection. Would like to speak to nurse.

## 2012-09-21 MED ORDER — ENOXAPARIN SODIUM 80 MG/0.8ML ~~LOC~~ SOLN: 80.0000 mg | Freq: Two times a day (BID) | SUBCUTANEOUS | Status: DC

## 2012-09-21 NOTE — Telephone Encounter (Signed)
Called and spoke with Juan Li and he states the time of spinal injection  has not been scheduled and this nurse called Dr Ethelene Hal office and left message on Lorie voice mail that pt has been cleared by Dr Shirlee Latch for spinal injection and would like to have procedure set up as soon as possible and pts phone numbers given and then called pt and informed of above and that Dr Ethelene Hal office would be getting in touch with him to schedule procedure and he states understanding

## 2012-09-21 NOTE — Telephone Encounter (Signed)
Spoke with patient. Pt aware I am forwarding this  to [facility] to help with Lovenox bridge.

## 2012-09-21 NOTE — Telephone Encounter (Signed)
Received call from  Jacki Cones from Dr Ethelene Hal office  that pt is scheduled for spinal injection on 09/28/2012 and this nurse called pt with Lovenox instructions   Spoke with pt and gave instructions regarding lovenox dosing and pt tried to write it down but was unable to comprehend this so pt instructed to come by office as he is near office at present and these instructions written and  given to him . Lovenox instructions regarding injections reviewed as well as given paper instructions and lovenox ordered. Talked with pt at length and phone number given as well if has any questions as written instructions repeated several times until he stated understanding 09/22/2012 last day to take coumadin 09/23/2012 no coumadin no Lovenox 09/24/2013 no coumadin Lovenox 80 mg 8am and loven0ox 80mg  8pm 09/25/2012 no coumadin Lovenox 80mg  8am and Lovenox 80mg  8pm 09/26/2012 no coumadin Lovenox 80 mg 8am and Lovenox 80mg  8pm 09/27/2012 no coumadin  Lovenox 80 mg 8am only 09/28/3012 no coumadin no Lovenox day of procedure when instructed by MD doing procedure to restart coumadin and lovenox restart coumadin at same dose except take extra 1/2 tablet for 2 days and resume Lovenox at same dose and continue lovenox and coumadin until seen in office on 10/03/2012. 09/28/2012 Day of Lumbar Spinal injection Age 79 WT 88.451kg SrCr 1.6 CrCl  47.60

## 2012-09-21 NOTE — Telephone Encounter (Signed)
New problem    Pt need to get some shots in his back and need to get the ok from Dr Shirlee Latch. Please call pt.

## 2012-09-28 DIAGNOSIS — M5137 Other intervertebral disc degeneration, lumbosacral region: Secondary | ICD-10-CM | POA: Diagnosis not present

## 2012-09-28 DIAGNOSIS — M4310 Spondylolisthesis, site unspecified: Secondary | ICD-10-CM | POA: Diagnosis not present

## 2012-09-28 DIAGNOSIS — M47817 Spondylosis without myelopathy or radiculopathy, lumbosacral region: Secondary | ICD-10-CM | POA: Diagnosis not present

## 2012-10-03 ENCOUNTER — Ambulatory Visit (INDEPENDENT_AMBULATORY_CARE_PROVIDER_SITE_OTHER): Payer: Medicare Other | Admitting: *Deleted

## 2012-10-03 ENCOUNTER — Ambulatory Visit: Payer: Medicare Other | Admitting: Cardiology

## 2012-10-03 DIAGNOSIS — I359 Nonrheumatic aortic valve disorder, unspecified: Secondary | ICD-10-CM | POA: Diagnosis not present

## 2012-10-03 DIAGNOSIS — I350 Nonrheumatic aortic (valve) stenosis: Secondary | ICD-10-CM

## 2012-10-03 DIAGNOSIS — Z954 Presence of other heart-valve replacement: Secondary | ICD-10-CM

## 2012-10-03 DIAGNOSIS — Z952 Presence of prosthetic heart valve: Secondary | ICD-10-CM

## 2012-10-03 DIAGNOSIS — Z7901 Long term (current) use of anticoagulants: Secondary | ICD-10-CM

## 2012-10-03 LAB — POCT INR: INR: 1.2

## 2012-10-04 DIAGNOSIS — N401 Enlarged prostate with lower urinary tract symptoms: Secondary | ICD-10-CM | POA: Diagnosis not present

## 2012-10-04 DIAGNOSIS — N3941 Urge incontinence: Secondary | ICD-10-CM | POA: Diagnosis not present

## 2012-10-04 DIAGNOSIS — R351 Nocturia: Secondary | ICD-10-CM | POA: Diagnosis not present

## 2012-10-08 ENCOUNTER — Other Ambulatory Visit (INDEPENDENT_AMBULATORY_CARE_PROVIDER_SITE_OTHER): Payer: Medicare Other

## 2012-10-08 ENCOUNTER — Ambulatory Visit (INDEPENDENT_AMBULATORY_CARE_PROVIDER_SITE_OTHER): Payer: Medicare Other | Admitting: Pharmacist

## 2012-10-08 DIAGNOSIS — I359 Nonrheumatic aortic valve disorder, unspecified: Secondary | ICD-10-CM | POA: Diagnosis not present

## 2012-10-08 DIAGNOSIS — Z954 Presence of other heart-valve replacement: Secondary | ICD-10-CM | POA: Diagnosis not present

## 2012-10-08 DIAGNOSIS — Z952 Presence of prosthetic heart valve: Secondary | ICD-10-CM

## 2012-10-08 DIAGNOSIS — I5032 Chronic diastolic (congestive) heart failure: Secondary | ICD-10-CM

## 2012-10-08 DIAGNOSIS — Z7901 Long term (current) use of anticoagulants: Secondary | ICD-10-CM | POA: Diagnosis not present

## 2012-10-08 DIAGNOSIS — I350 Nonrheumatic aortic (valve) stenosis: Secondary | ICD-10-CM

## 2012-10-08 LAB — POCT INR: INR: 1.3

## 2012-10-08 LAB — BASIC METABOLIC PANEL
Calcium: 9 mg/dL (ref 8.4–10.5)
Creatinine, Ser: 1.3 mg/dL (ref 0.4–1.5)
GFR: 54.67 mL/min — ABNORMAL LOW (ref >60.00)
Sodium: 137 mEq/L (ref 135–145)

## 2012-10-11 ENCOUNTER — Telehealth: Payer: Self-pay | Admitting: Cardiology

## 2012-10-11 MED ORDER — ENOXAPARIN SODIUM 80 MG/0.8ML ~~LOC~~ SOLN: 80.0000 mg | Freq: Two times a day (BID) | SUBCUTANEOUS | Status: DC

## 2012-10-11 NOTE — Telephone Encounter (Signed)
Spoke with pt's wife.  Pt has not been taking his medications correctly since Monday and she is concerned since his INR was subtherapeutic.  Pt did not take any medications on Monday night, left Coumadin in the box on Tuesday night, and unsure if he took entire dose on Wednesday.  He took his last Lovenox yesterday.  She questions if he should get another Rx for the injections.  Pt's INR likely still subtherapeutic.  Will have him take 10mg  today and send in another Rx for Lovenox to last until appt on 6/9.

## 2012-10-11 NOTE — Telephone Encounter (Signed)
New Prob     States pts blood is very thick and did not take WARFARIN. Would like to speak to [facility] regarding this.

## 2012-10-12 DIAGNOSIS — M5137 Other intervertebral disc degeneration, lumbosacral region: Secondary | ICD-10-CM | POA: Diagnosis not present

## 2012-10-15 ENCOUNTER — Ambulatory Visit (INDEPENDENT_AMBULATORY_CARE_PROVIDER_SITE_OTHER): Payer: Medicare Other | Admitting: *Deleted

## 2012-10-15 DIAGNOSIS — I350 Nonrheumatic aortic (valve) stenosis: Secondary | ICD-10-CM

## 2012-10-15 DIAGNOSIS — I359 Nonrheumatic aortic valve disorder, unspecified: Secondary | ICD-10-CM | POA: Diagnosis not present

## 2012-10-15 DIAGNOSIS — Z954 Presence of other heart-valve replacement: Secondary | ICD-10-CM | POA: Diagnosis not present

## 2012-10-15 DIAGNOSIS — Z952 Presence of prosthetic heart valve: Secondary | ICD-10-CM

## 2012-10-15 DIAGNOSIS — Z7901 Long term (current) use of anticoagulants: Secondary | ICD-10-CM

## 2012-10-15 LAB — POCT INR: INR: 1.5

## 2012-10-19 ENCOUNTER — Ambulatory Visit (INDEPENDENT_AMBULATORY_CARE_PROVIDER_SITE_OTHER): Payer: Medicare Other | Admitting: Pharmacist

## 2012-10-19 DIAGNOSIS — I359 Nonrheumatic aortic valve disorder, unspecified: Secondary | ICD-10-CM | POA: Diagnosis not present

## 2012-10-19 DIAGNOSIS — Z7901 Long term (current) use of anticoagulants: Secondary | ICD-10-CM

## 2012-10-19 DIAGNOSIS — I350 Nonrheumatic aortic (valve) stenosis: Secondary | ICD-10-CM

## 2012-10-19 DIAGNOSIS — Z952 Presence of prosthetic heart valve: Secondary | ICD-10-CM

## 2012-10-19 DIAGNOSIS — Z954 Presence of other heart-valve replacement: Secondary | ICD-10-CM | POA: Diagnosis not present

## 2012-10-19 LAB — POCT INR: INR: 2.4

## 2012-11-05 ENCOUNTER — Ambulatory Visit (INDEPENDENT_AMBULATORY_CARE_PROVIDER_SITE_OTHER): Payer: Medicare Other | Admitting: *Deleted

## 2012-11-05 DIAGNOSIS — Z954 Presence of other heart-valve replacement: Secondary | ICD-10-CM | POA: Diagnosis not present

## 2012-11-05 DIAGNOSIS — Z952 Presence of prosthetic heart valve: Secondary | ICD-10-CM

## 2012-11-05 DIAGNOSIS — I359 Nonrheumatic aortic valve disorder, unspecified: Secondary | ICD-10-CM

## 2012-11-05 DIAGNOSIS — Z7901 Long term (current) use of anticoagulants: Secondary | ICD-10-CM

## 2012-11-05 DIAGNOSIS — I350 Nonrheumatic aortic (valve) stenosis: Secondary | ICD-10-CM

## 2012-11-05 LAB — POCT INR: INR: 2.7

## 2012-11-30 ENCOUNTER — Ambulatory Visit (INDEPENDENT_AMBULATORY_CARE_PROVIDER_SITE_OTHER): Payer: Medicare Other | Admitting: Family Medicine

## 2012-11-30 ENCOUNTER — Encounter: Payer: Self-pay | Admitting: Family Medicine

## 2012-11-30 VITALS — BP 127/54 | HR 59 | Temp 98.2°F | Ht 67.0 in | Wt 195.7 lb

## 2012-11-30 DIAGNOSIS — E559 Vitamin D deficiency, unspecified: Secondary | ICD-10-CM | POA: Insufficient documentation

## 2012-11-30 DIAGNOSIS — N182 Chronic kidney disease, stage 2 (mild): Secondary | ICD-10-CM | POA: Diagnosis not present

## 2012-11-30 DIAGNOSIS — I10 Essential (primary) hypertension: Secondary | ICD-10-CM

## 2012-11-30 DIAGNOSIS — I5032 Chronic diastolic (congestive) heart failure: Secondary | ICD-10-CM | POA: Diagnosis not present

## 2012-11-30 DIAGNOSIS — I509 Heart failure, unspecified: Secondary | ICD-10-CM

## 2012-11-30 HISTORY — DX: Vitamin D deficiency, unspecified: E55.9

## 2012-11-30 MED ORDER — AMLODIPINE BESYLATE 10 MG PO TABS: 5.0000 mg | ORAL_TABLET | Freq: Every morning | ORAL | Status: DC

## 2012-11-30 MED ORDER — ZOSTER VACCINE LIVE 19400 UNT/0.65ML ~~LOC~~ SOLR: 0.6500 mL | Freq: Once | SUBCUTANEOUS | Status: DC

## 2012-11-30 MED ORDER — CHOLECALCIFEROL 1.25 MG (50000 UT) PO CAPS: 50000.0000 [IU] | ORAL_CAPSULE | Freq: Every day | ORAL | Status: DC

## 2012-11-30 NOTE — Assessment & Plan Note (Signed)
Treat

## 2012-11-30 NOTE — Progress Notes (Signed)
  Subjective:    Patient ID: Juan Li, male    DOB: 10/19/1931, 79 y.o.   MRN: [id number]  HPIMulti problems 1. Low vit d on [facility] blood work 2. Dizzy wonders if overtreating HBP.  Stopped metoprolol on own 15 days ago. 3. Has CHF but diastolic rather than systolic so no compelling reason for metoprolol 4. High potassium on [facility] blood work 5. Creat at [facility] was 1.45 - a bit above his previous baseline.   Review of Systems     Objective:   Physical Exam Cardiac 2/6 SEM Lungs clear Ext 1+ bilateral edema       Assessment & Plan:

## 2012-11-30 NOTE — Assessment & Plan Note (Signed)
Stable.  Since diastolic, no compelling reason for B blocker.

## 2012-11-30 NOTE — Assessment & Plan Note (Signed)
Will need q6 month BMP

## 2012-11-30 NOTE — Assessment & Plan Note (Signed)
OK to stay off metoprolol.  Trial of decreased amlodipine.  Follow BP carefully.

## 2012-11-30 NOTE — Patient Instructions (Addendum)
Stop taking potassium pills.  Your potassium was high on the [facility] blood work.   OK to stay off metoprolol.  Make sure you mention to Dr. Shirlee Latch.   I sent in a prescription for the Vitamin D.   He will be due for a cholesterol check afte 04/10/13. Try taking 1/2 tab of amlodipine daily.  Watch the blood pressure and see if that helps the dizziness. Please double check your medications against the list I provide to make sure we agree Please let me know if you get the shingles vaccine so I can update my records.   See me in late Oct or early Nov to get a flu shot and recheck.

## 2012-12-03 ENCOUNTER — Ambulatory Visit (INDEPENDENT_AMBULATORY_CARE_PROVIDER_SITE_OTHER): Payer: Medicare Other | Admitting: *Deleted

## 2012-12-03 DIAGNOSIS — I350 Nonrheumatic aortic (valve) stenosis: Secondary | ICD-10-CM

## 2012-12-03 DIAGNOSIS — I359 Nonrheumatic aortic valve disorder, unspecified: Secondary | ICD-10-CM | POA: Diagnosis not present

## 2012-12-03 DIAGNOSIS — Z954 Presence of other heart-valve replacement: Secondary | ICD-10-CM

## 2012-12-03 DIAGNOSIS — Z7901 Long term (current) use of anticoagulants: Secondary | ICD-10-CM | POA: Diagnosis not present

## 2012-12-03 DIAGNOSIS — Z952 Presence of prosthetic heart valve: Secondary | ICD-10-CM

## 2012-12-03 LAB — POCT INR: INR: 3.3

## 2012-12-26 ENCOUNTER — Encounter: Payer: Self-pay | Admitting: Cardiology

## 2012-12-26 ENCOUNTER — Ambulatory Visit (INDEPENDENT_AMBULATORY_CARE_PROVIDER_SITE_OTHER): Payer: Medicare Other | Admitting: Cardiology

## 2012-12-26 ENCOUNTER — Ambulatory Visit (INDEPENDENT_AMBULATORY_CARE_PROVIDER_SITE_OTHER): Payer: Medicare Other | Admitting: *Deleted

## 2012-12-26 VITALS — BP 116/48 | HR 64 | Ht 67.0 in | Wt 179.4 lb

## 2012-12-26 DIAGNOSIS — R0602 Shortness of breath: Secondary | ICD-10-CM

## 2012-12-26 DIAGNOSIS — Z7901 Long term (current) use of anticoagulants: Secondary | ICD-10-CM

## 2012-12-26 DIAGNOSIS — Z954 Presence of other heart-valve replacement: Secondary | ICD-10-CM | POA: Diagnosis not present

## 2012-12-26 DIAGNOSIS — I10 Essential (primary) hypertension: Secondary | ICD-10-CM

## 2012-12-26 DIAGNOSIS — I359 Nonrheumatic aortic valve disorder, unspecified: Secondary | ICD-10-CM | POA: Diagnosis not present

## 2012-12-26 DIAGNOSIS — I509 Heart failure, unspecified: Secondary | ICD-10-CM

## 2012-12-26 DIAGNOSIS — Z952 Presence of prosthetic heart valve: Secondary | ICD-10-CM

## 2012-12-26 DIAGNOSIS — I5032 Chronic diastolic (congestive) heart failure: Secondary | ICD-10-CM

## 2012-12-26 DIAGNOSIS — I350 Nonrheumatic aortic (valve) stenosis: Secondary | ICD-10-CM

## 2012-12-26 MED ORDER — METOPROLOL TARTRATE 25 MG PO TABS: 25.0000 mg | ORAL_TABLET | Freq: Two times a day (BID) | ORAL | Status: DC

## 2012-12-26 MED ORDER — ENALAPRIL MALEATE 10 MG PO TABS: 10.0000 mg | ORAL_TABLET | Freq: Every day | ORAL | Status: DC

## 2012-12-26 NOTE — Patient Instructions (Addendum)
Take metoprolol 25mg  two times a day. You can take 1/2 of your 50mg  tablet two times a day.  Decrease enalapril to 10mg  two times a day.   Your physician recommends that you have lab work today--BMET/BNP.   Your physician recommends that you schedule a follow-up appointment in: 4 months with Dr Shirlee Latch.

## 2012-12-27 LAB — BRAIN NATRIURETIC PEPTIDE: Pro B Natriuretic peptide (BNP): 100 pg/mL (ref 0.0–100.0)

## 2012-12-27 LAB — BASIC METABOLIC PANEL
BUN: 25 mg/dL — ABNORMAL HIGH (ref 6–23)
CO2: 22 mEq/L (ref 19–32)
Chloride: 108 mEq/L (ref 96–112)
Potassium: 4.8 mEq/L (ref 3.5–5.1)

## 2012-12-28 NOTE — Progress Notes (Signed)
Patient ID: Juan Li, male   DOB: 03/12/1932, 79 y.o.   MRN: [id number] PCP: Dr. Leveda Anna  79 yo with history of AS s/p St Jude AVR and chronic diastolic CHF presents for cardiology followup.  His valve was replaced in 1993.  Echo in 10/13 showed normal EF and a well-seated mechanical valve.  Cardiac cath in 1993 showed nonobstructive disease.  He has been started on Namendo and Aricept for some degree of dementia.  Weight is down about 16 lbs since last appointment.  He is taking Lasix 40 mg daily.  He is breathing better and is able to walk 50-75 yards without dyspnea.  He continues to have some gait instability.  Back and leg pain are limiting.  No recent falls.  No orthopnea, PND, or chest pain.  His lower extremity edema is still present but is improved.  He has occasional "dizzy spells" when he gets up in the morning (2-3 times a week).  BP is on the low side.    Labs (1/12): K 4.8, creatinine 1.3 Labs (12/12): creatinine 1.2, LDL 177 Labs (10/13): creatinine 1.6 => 1.1, LDL 135 Labs (12/13): LDL 74, HDL 30 Labs (4/14): K 4.6 => 4.8, creatinine 1.1 => 1.3, TSH normal, HCT 37.6, BNP 180 Labs (6/14): K 4.3, creatinine 1.3  PMH: 1. Dementia: On Aricept and Namenda.   2. Hyperlipidemia: myalgias with Vytorin, tolerating pravastatin 3. BPH 4. Osteoarthritis right knee, s/p TKR 10/13.  5. Cervical myelopathy  6. Obesity 7. HTN 8. Aortic stenosis: St. Jude mechanical aortic valve placed in 1993. Echo (10/13) with EF 65-70% and normal function of the aortic valve (mean gradient 12 mmHg).   9. Cardiac cath in 1993 with nonobstructive disease.  10. L-spine disease/spinal stenosis.  11. Abdominal ultrasound in 12/11 did not show AAA 12. Gout 13. Shoulder surgery 1/12 14. CKD 15. Chronid diastolic CHF: Echo (10/13) with EF 65-70%, normally-functioning mechanical aortic valve (mean gradient 12).    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   FH: No  premature CAD.    Current Outpatient Prescriptions  Medication Sig Dispense Refill  . aspirin EC 81 MG tablet Take 81 mg by mouth every morning.       . donepezil (ARICEPT) 5 MG tablet TAKE 1 TABLET BY MOUTH EVERY DAY  90 tablet  3  . dutasteride (AVODART) 0.5 MG capsule Take 0.5 mg by mouth every evening.       . furosemide (LASIX) 40 MG tablet Take 1 tablet (40 mg total) by mouth daily.  30 tablet  6  . HYDROcodone-acetaminophen (NORCO) 10-325 MG per tablet Take 1 tablet by mouth every 6 (six) hours as needed for pain.  360 tablet  3  . memantine (NAMENDA) 5 MG tablet Take 5 mg by mouth 2 (two) times daily.      . potassium chloride (K-DUR,KLOR-CON) 10 MEQ tablet Take 10 mEq by mouth daily.      . pravastatin (PRAVACHOL) 40 MG tablet Take 2 tablets (80 mg total) by mouth daily.  60 tablet  2  . Tamsulosin HCl (FLOMAX) 0.4 MG CAPS Take 1 capsule (0.4 mg total) by mouth daily.  90 capsule  3  . warfarin (COUMADIN) 5 MG tablet Take 1 tablet (5 mg total) by mouth one time only at 6 PM. Take Coumadin for three weeks for postoperative protocol and then the patient may resume their previous Coumadin home regimen.  The dose may need to be  adjusted based upon the INR.  Please follow the INR and titrate Coumadin dose for a therapeutic range between 2.0 and 3.0 INR.  After completing the three weeks of Coumadin, the patient may resume their previous Coumadin home regimen.  30 tablet  0  . zoster vaccine live, PF, (ZOSTAVAX) 16109 UNT/0.65ML injection Inject 19,400 Units into the skin once.  1 each  0  . enalapril (VASOTEC) 10 MG tablet Take 1 tablet (10 mg total) by mouth daily.  60 tablet  6  . metoprolol tartrate (LOPRESSOR) 25 MG tablet Take 1 tablet (25 mg total) by mouth 2 (two) times daily.  60 tablet  6   No current facility-administered medications for this visit.    BP 116/48  Pulse 64  Ht 5\' 7"  (1.702 m)  Wt 81.375 kg (179 lb 6.4 oz)  BMI 28.09 kg/m2  SpO2 96% General: NAD, obese Neck:  JVP not elevated, no thyromegaly or thyroid nodule.  Lungs: Clear to auscultation bilaterally with normal respiratory effort. CV: Nondisplaced PMI.  Heart regular S1/S2, mechanical S2, no S3/S4, 1/6 SEM RUSB.  1+ edema 1/2 up lower legs bilaterally.  No carotid bruit.  Normal pedal pulses.  Abdomen: Soft, nontender, no hepatosplenomegaly, no distention.  Neurologic: Alert and oriented x 3.  Psych: Normal affect. Extremities: No clubbing or cyanosis.   Assessment/Plan:  Aortic valve disorders  Mechanical aortic valve.  No bleeding problems on coumadin. Continue ASA 81 also.   Valve appeared normal on 10/13 echo.  Crisp S2 on exam today.  HYPERCHOLESTEROLEMIA  Tolerating pravastatin.  Good LDL in 12/13.  HYPERTENSION BP actually on the low side.  I will have him decrease enalapril to 10 mg bid.  He is taking metoprolol 50 mg once a day.  I will have him take it 25 mg bid.  Chronic diastolic CHF Volume status improved on Lasix with decreased weight.  NYHA class II-III symptoms.  Will check BMET and BNP today.    [name] 12/28/2012 9:53 AM

## 2013-01-05 ENCOUNTER — Other Ambulatory Visit: Payer: Self-pay | Admitting: Cardiology

## 2013-01-07 NOTE — Telephone Encounter (Signed)
Wife to call back and confirm if this pt is on this medication

## 2013-01-08 DIAGNOSIS — M999 Biomechanical lesion, unspecified: Secondary | ICD-10-CM | POA: Diagnosis not present

## 2013-01-08 DIAGNOSIS — M5137 Other intervertebral disc degeneration, lumbosacral region: Secondary | ICD-10-CM | POA: Diagnosis not present

## 2013-01-08 DIAGNOSIS — M25559 Pain in unspecified hip: Secondary | ICD-10-CM | POA: Diagnosis not present

## 2013-01-08 DIAGNOSIS — IMO0002 Reserved for concepts with insufficient information to code with codable children: Secondary | ICD-10-CM | POA: Diagnosis not present

## 2013-01-09 DIAGNOSIS — M25559 Pain in unspecified hip: Secondary | ICD-10-CM | POA: Diagnosis not present

## 2013-01-09 DIAGNOSIS — IMO0002 Reserved for concepts with insufficient information to code with codable children: Secondary | ICD-10-CM | POA: Diagnosis not present

## 2013-01-09 DIAGNOSIS — M5137 Other intervertebral disc degeneration, lumbosacral region: Secondary | ICD-10-CM | POA: Diagnosis not present

## 2013-01-09 DIAGNOSIS — M999 Biomechanical lesion, unspecified: Secondary | ICD-10-CM | POA: Diagnosis not present

## 2013-01-10 ENCOUNTER — Other Ambulatory Visit: Payer: Self-pay | Admitting: Family Medicine

## 2013-01-10 DIAGNOSIS — M25559 Pain in unspecified hip: Secondary | ICD-10-CM | POA: Diagnosis not present

## 2013-01-10 DIAGNOSIS — IMO0002 Reserved for concepts with insufficient information to code with codable children: Secondary | ICD-10-CM | POA: Diagnosis not present

## 2013-01-10 DIAGNOSIS — M5137 Other intervertebral disc degeneration, lumbosacral region: Secondary | ICD-10-CM | POA: Diagnosis not present

## 2013-01-10 DIAGNOSIS — M999 Biomechanical lesion, unspecified: Secondary | ICD-10-CM | POA: Diagnosis not present

## 2013-01-21 ENCOUNTER — Ambulatory Visit (INDEPENDENT_AMBULATORY_CARE_PROVIDER_SITE_OTHER): Payer: Medicare Other | Admitting: Pharmacist

## 2013-01-21 DIAGNOSIS — Z954 Presence of other heart-valve replacement: Secondary | ICD-10-CM

## 2013-01-21 DIAGNOSIS — I350 Nonrheumatic aortic (valve) stenosis: Secondary | ICD-10-CM

## 2013-01-21 DIAGNOSIS — I359 Nonrheumatic aortic valve disorder, unspecified: Secondary | ICD-10-CM

## 2013-01-21 DIAGNOSIS — Z952 Presence of prosthetic heart valve: Secondary | ICD-10-CM

## 2013-01-21 DIAGNOSIS — Z7901 Long term (current) use of anticoagulants: Secondary | ICD-10-CM

## 2013-01-21 LAB — POCT INR: INR: 3

## 2013-01-31 ENCOUNTER — Encounter: Payer: Self-pay | Admitting: Cardiology

## 2013-01-31 ENCOUNTER — Ambulatory Visit (INDEPENDENT_AMBULATORY_CARE_PROVIDER_SITE_OTHER): Payer: Medicare Other | Admitting: Cardiology

## 2013-01-31 VITALS — BP 155/86 | HR 54 | Ht 67.0 in | Wt 199.4 lb

## 2013-01-31 DIAGNOSIS — R0602 Shortness of breath: Secondary | ICD-10-CM | POA: Diagnosis not present

## 2013-01-31 DIAGNOSIS — I5032 Chronic diastolic (congestive) heart failure: Secondary | ICD-10-CM

## 2013-01-31 DIAGNOSIS — E7800 Pure hypercholesterolemia, unspecified: Secondary | ICD-10-CM

## 2013-01-31 DIAGNOSIS — Z952 Presence of prosthetic heart valve: Secondary | ICD-10-CM

## 2013-01-31 DIAGNOSIS — I509 Heart failure, unspecified: Secondary | ICD-10-CM

## 2013-01-31 DIAGNOSIS — I10 Essential (primary) hypertension: Secondary | ICD-10-CM | POA: Diagnosis not present

## 2013-01-31 DIAGNOSIS — I359 Nonrheumatic aortic valve disorder, unspecified: Secondary | ICD-10-CM | POA: Diagnosis not present

## 2013-01-31 DIAGNOSIS — Z954 Presence of other heart-valve replacement: Secondary | ICD-10-CM | POA: Diagnosis not present

## 2013-01-31 NOTE — Patient Instructions (Signed)
Take lasix(furosemide) 40mg  at least every other day.   Take KCL(potassium) 10 mEq every other day on the days you take lasix (furosemide).  Your physician recommends that you return for a FASTING lipid profile /BMET/BNP in about 7-10 days.  Your physician recommends that you schedule a follow-up appointment in: 3 months with Dr Shirlee Latch.

## 2013-01-31 NOTE — Progress Notes (Signed)
Patient ID: Juan Li, male   DOB: July 21, 1931, 79 y.o.   MRN: [id number] PCP: Dr. Leveda Anna  79 yo with history of AS s/p St Jude AVR and chronic diastolic CHF presents for cardiology followup.  His valve was replaced in 1993.  Echo in 10/13 showed normal EF and a well-seated mechanical valve.  Cardiac cath in 1993 showed nonobstructive disease.  He has been started on Namendo and Aricept for some degree of dementia.  Weight is up 2 lbs since last appointment.  He is not taking Lasix because of the concern that he will not be able to get to a bathroom if he leaves his house.  His lower extremity edema has increased.  He continues to do ok walking on flat ground for 50-75 yards and he can climb a short flight of steps at his house.  He continues to have some gait instability.  Back and leg pain are limiting.  No recent falls.  No orthopnea, PND, or chest pain.  He has occasional spells of orthostatic lightheadedness, but less than in the past.   Labs (1/12): K 4.8, creatinine 1.3 Labs (12/12): creatinine 1.2, LDL 177 Labs (10/13): creatinine 1.6 => 1.1, LDL 135 Labs (12/13): LDL 74, HDL 30 Labs (4/14): K 4.6 => 4.8, creatinine 1.1 => 1.3, TSH normal, HCT 37.6, BNP 180 Labs (6/14): K 4.3, creatinine 1.3 Labs (8/14): K 4.8, creatinine 1.5, BNP 100  PMH: 1. Dementia: On Aricept and Namenda.   2. Hyperlipidemia: myalgias with Vytorin, tolerating pravastatin 3. BPH 4. Osteoarthritis right knee, s/p TKR 10/13.  5. Cervical myelopathy  6. Obesity 7. HTN 8. Aortic stenosis: St. Jude mechanical aortic valve placed in 1993. Echo (10/13) with EF 65-70% and normal function of the aortic valve (mean gradient 12 mmHg).   9. Cardiac cath in 1993 with nonobstructive disease.  10. L-spine disease/spinal stenosis.  11. Abdominal ultrasound in 12/11 did not show AAA 12. Gout 13. Shoulder surgery 1/12 14. CKD 15. Chronid diastolic CHF: Echo (10/13) with EF 65-70%, normally-functioning mechanical aortic valve  (mean gradient 12).    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  Does not smoke.   FH: No premature CAD.   ROS: All systems reviewed and negative except as per HPI.    Current Outpatient Prescriptions  Medication Sig Dispense Refill  . aspirin EC 81 MG tablet Take 81 mg by mouth every morning.       . donepezil (ARICEPT) 5 MG tablet TAKE 1 TABLET BY MOUTH EVERY DAY  90 tablet  3  . dutasteride (AVODART) 0.5 MG capsule Take 0.5 mg by mouth every evening.       . enalapril (VASOTEC) 10 MG tablet Take 1 tablet (10 mg total) by mouth daily.  60 tablet  6  . furosemide (LASIX) 40 MG tablet 1 tablet every other day      . HYDROcodone-acetaminophen (NORCO) 10-325 MG per tablet Take 1 tablet by mouth every 6 (six) hours as needed for pain.  360 tablet  3  . metoprolol tartrate (LOPRESSOR) 25 MG tablet Take 1 tablet (25 mg total) by mouth 2 (two) times daily.  60 tablet  6  . NAMENDA 10 MG tablet TAKE 1 TABLET TWICE A DAY  180 tablet  3  . potassium chloride (K-DUR,KLOR-CON) 10 MEQ tablet 1 tablet every other day on the days you take furosemide (lasix).      . pravastatin (PRAVACHOL) 40 MG tablet Take 2 tablets (80 mg total)  by mouth daily.  60 tablet  2  . Tamsulosin HCl (FLOMAX) 0.4 MG CAPS Take 1 capsule (0.4 mg total) by mouth daily.  90 capsule  3  . warfarin (COUMADIN) 5 MG tablet TAKE AS DIRECTED  100 tablet  PRN   No current facility-administered medications for this visit.    BP 155/86  Pulse 54  Ht 5\' 7"  (1.702 m)  Wt 90.447 kg (199 lb 6.4 oz)  BMI 31.22 kg/m2 General: NAD, obese Neck: JVP 8 cm, no thyromegaly or thyroid nodule.  Lungs: Clear to auscultation bilaterally with normal respiratory effort. CV: Nondisplaced PMI.  Heart regular S1/S2, mechanical S2, no S3/S4, 1/6 SEM RUSB.  2+ edema 1/2 up lower legs bilaterally.  No carotid bruit.  Normal pedal pulses.  Abdomen: Soft, nontender, no hepatosplenomegaly, no distention.  Neurologic: Alert and oriented x  3.  Psych: Normal affect. Extremities: No clubbing or cyanosis.   Assessment/Plan:  Aortic valve disorders  Mechanical aortic valve.  No bleeding problems on coumadin. Continue ASA 81 also.   Valve appeared normal on 10/13 echo.  Crisp S2 on exam today.  HYPERCHOLESTEROLEMIA  Tolerating pravastatin.  Will need to check lipids.  HYPERTENSION At last appointment, I cut back on his BP meds because of orthostatic symptoms.  These have mostly resolved and BP is actually a little high today.  No changes.  Chronic diastolic CHF He appears more volume overloaded now that he is not taking Lasix.  NYHA class II-III symptoms.  I asked him to try to take Lasix at least every other day.  He can take it in the afternoon, which will allow him to get out in the morning. He only need to take KCl on days he takes Lasix.  Check BMET and BNP.    [name] 01/31/2013

## 2013-02-07 ENCOUNTER — Other Ambulatory Visit (INDEPENDENT_AMBULATORY_CARE_PROVIDER_SITE_OTHER): Payer: Medicare Other

## 2013-02-07 DIAGNOSIS — I5032 Chronic diastolic (congestive) heart failure: Secondary | ICD-10-CM

## 2013-02-07 DIAGNOSIS — Z954 Presence of other heart-valve replacement: Secondary | ICD-10-CM

## 2013-02-07 DIAGNOSIS — R0602 Shortness of breath: Secondary | ICD-10-CM | POA: Diagnosis not present

## 2013-02-07 DIAGNOSIS — I10 Essential (primary) hypertension: Secondary | ICD-10-CM | POA: Diagnosis not present

## 2013-02-07 LAB — LIPID PANEL
HDL: 35.2 mg/dL — ABNORMAL LOW (ref >39.00)
Total CHOL/HDL Ratio: 5
Triglycerides: 199 mg/dL — ABNORMAL HIGH (ref 0.0–149.0)

## 2013-02-07 LAB — BASIC METABOLIC PANEL
CO2: 25 mEq/L (ref 19–32)
Calcium: 9.1 mg/dL (ref 8.4–10.5)
Chloride: 106 mEq/L (ref 96–112)
Creatinine, Ser: 1.3 mg/dL (ref 0.4–1.5)
Glucose, Bld: 104 mg/dL — ABNORMAL HIGH (ref 70–99)
Potassium: 4.1 mEq/L (ref 3.5–5.1)
Sodium: 140 mEq/L (ref 135–145)

## 2013-02-07 LAB — BRAIN NATRIURETIC PEPTIDE: Pro B Natriuretic peptide (BNP): 184 pg/mL — ABNORMAL HIGH (ref 0.0–100.0)

## 2013-02-10 ENCOUNTER — Other Ambulatory Visit: Payer: Self-pay | Admitting: Cardiology

## 2013-02-13 ENCOUNTER — Telehealth: Payer: Self-pay | Admitting: Family Medicine

## 2013-02-13 ENCOUNTER — Other Ambulatory Visit: Payer: Self-pay | Admitting: Family Medicine

## 2013-02-13 DIAGNOSIS — M159 Polyosteoarthritis, unspecified: Secondary | ICD-10-CM

## 2013-02-13 MED ORDER — DUTASTERIDE 0.5 MG PO CAPS: 0.5000 mg | ORAL_CAPSULE | Freq: Every evening | ORAL | Status: DC

## 2013-02-13 NOTE — Telephone Encounter (Signed)
Called and LM that I refilled the avodart.  Said I will call in the morning to discuss change in hydrocodone.

## 2013-02-13 NOTE — Telephone Encounter (Signed)
Refill request for Avodart to [facility] (90 day )  and hydrocodone.

## 2013-02-14 MED ORDER — HYDROCODONE-ACETAMINOPHEN 10-325 MG PO TABS: 1.0000 | ORAL_TABLET | Freq: Four times a day (QID) | ORAL | Status: DC | PRN

## 2013-02-14 NOTE — Telephone Encounter (Signed)
Will leave Rx up front this time.  Told in future will need q75month office visits

## 2013-02-18 ENCOUNTER — Ambulatory Visit (INDEPENDENT_AMBULATORY_CARE_PROVIDER_SITE_OTHER): Payer: Medicare Other | Admitting: General Practice

## 2013-02-18 DIAGNOSIS — Z7901 Long term (current) use of anticoagulants: Secondary | ICD-10-CM | POA: Diagnosis not present

## 2013-02-18 DIAGNOSIS — I350 Nonrheumatic aortic (valve) stenosis: Secondary | ICD-10-CM

## 2013-02-18 DIAGNOSIS — I359 Nonrheumatic aortic valve disorder, unspecified: Secondary | ICD-10-CM | POA: Diagnosis not present

## 2013-02-18 DIAGNOSIS — Z954 Presence of other heart-valve replacement: Secondary | ICD-10-CM | POA: Diagnosis not present

## 2013-02-18 DIAGNOSIS — Z952 Presence of prosthetic heart valve: Secondary | ICD-10-CM

## 2013-02-18 LAB — POCT INR: INR: 3.4

## 2013-03-18 ENCOUNTER — Ambulatory Visit (INDEPENDENT_AMBULATORY_CARE_PROVIDER_SITE_OTHER): Payer: Medicare Other | Admitting: Pharmacist

## 2013-03-18 DIAGNOSIS — I350 Nonrheumatic aortic (valve) stenosis: Secondary | ICD-10-CM

## 2013-03-18 DIAGNOSIS — Z952 Presence of prosthetic heart valve: Secondary | ICD-10-CM

## 2013-03-18 DIAGNOSIS — Z7901 Long term (current) use of anticoagulants: Secondary | ICD-10-CM | POA: Diagnosis not present

## 2013-03-18 DIAGNOSIS — I359 Nonrheumatic aortic valve disorder, unspecified: Secondary | ICD-10-CM | POA: Diagnosis not present

## 2013-03-18 DIAGNOSIS — Z954 Presence of other heart-valve replacement: Secondary | ICD-10-CM | POA: Diagnosis not present

## 2013-03-18 LAB — POCT INR: INR: 2.9

## 2013-03-22 ENCOUNTER — Telehealth: Payer: Self-pay | Admitting: Family Medicine

## 2013-03-22 NOTE — Telephone Encounter (Signed)
Pt called because the prescription that Dr. Leveda Anna wrote for Hydrocodone states that it is a 360 qty three month supply. [facility] would only give them 120 and said that they could not do any refills on the difference. jw

## 2013-03-25 ENCOUNTER — Other Ambulatory Visit: Payer: Self-pay | Admitting: Cardiology

## 2013-03-25 ENCOUNTER — Other Ambulatory Visit: Payer: Self-pay

## 2013-03-25 DIAGNOSIS — R0602 Shortness of breath: Secondary | ICD-10-CM

## 2013-03-25 DIAGNOSIS — I10 Essential (primary) hypertension: Secondary | ICD-10-CM

## 2013-03-25 DIAGNOSIS — I5032 Chronic diastolic (congestive) heart failure: Secondary | ICD-10-CM

## 2013-03-25 DIAGNOSIS — Z954 Presence of other heart-valve replacement: Secondary | ICD-10-CM

## 2013-03-25 MED ORDER — FUROSEMIDE 40 MG PO TABS: ORAL_TABLET | ORAL | Status: DC

## 2013-03-25 NOTE — Telephone Encounter (Signed)
Called and LM (no answer x 2)  I will provide monthly prescriptions.

## 2013-04-08 ENCOUNTER — Telehealth: Payer: Self-pay | Admitting: Family Medicine

## 2013-04-08 DIAGNOSIS — M159 Polyosteoarthritis, unspecified: Secondary | ICD-10-CM

## 2013-04-08 NOTE — Telephone Encounter (Signed)
Pt called and would like a refill on his hydrocodone left up front. jw

## 2013-04-09 MED ORDER — HYDROCODONE-ACETAMINOPHEN 10-325 MG PO TABS: 1.0000 | ORAL_TABLET | Freq: Four times a day (QID) | ORAL | Status: DC | PRN

## 2013-04-09 NOTE — Assessment & Plan Note (Signed)
Refilled per patient request.  Note:  Quantity dispensed last month was only 120 (one month supply), not 360

## 2013-04-29 ENCOUNTER — Encounter: Payer: Self-pay | Admitting: Cardiology

## 2013-04-29 ENCOUNTER — Ambulatory Visit (INDEPENDENT_AMBULATORY_CARE_PROVIDER_SITE_OTHER): Payer: Medicare Other | Admitting: Cardiology

## 2013-04-29 ENCOUNTER — Ambulatory Visit (INDEPENDENT_AMBULATORY_CARE_PROVIDER_SITE_OTHER): Payer: Medicare Other | Admitting: Pharmacist

## 2013-04-29 VITALS — BP 162/62 | HR 56 | Ht 67.0 in | Wt 202.0 lb

## 2013-04-29 DIAGNOSIS — I359 Nonrheumatic aortic valve disorder, unspecified: Secondary | ICD-10-CM | POA: Diagnosis not present

## 2013-04-29 DIAGNOSIS — Z7901 Long term (current) use of anticoagulants: Secondary | ICD-10-CM

## 2013-04-29 DIAGNOSIS — I5032 Chronic diastolic (congestive) heart failure: Secondary | ICD-10-CM | POA: Diagnosis not present

## 2013-04-29 DIAGNOSIS — Z952 Presence of prosthetic heart valve: Secondary | ICD-10-CM

## 2013-04-29 DIAGNOSIS — R0602 Shortness of breath: Secondary | ICD-10-CM

## 2013-04-29 DIAGNOSIS — I10 Essential (primary) hypertension: Secondary | ICD-10-CM | POA: Diagnosis not present

## 2013-04-29 DIAGNOSIS — I350 Nonrheumatic aortic (valve) stenosis: Secondary | ICD-10-CM

## 2013-04-29 DIAGNOSIS — Z954 Presence of other heart-valve replacement: Secondary | ICD-10-CM

## 2013-04-29 DIAGNOSIS — E7800 Pure hypercholesterolemia, unspecified: Secondary | ICD-10-CM

## 2013-04-29 DIAGNOSIS — I509 Heart failure, unspecified: Secondary | ICD-10-CM

## 2013-04-29 LAB — POCT INR: INR: 2.8

## 2013-04-29 MED ORDER — POTASSIUM CHLORIDE CRYS ER 10 MEQ PO TBCR: 10.0000 meq | EXTENDED_RELEASE_TABLET | Freq: Once | ORAL | Status: DC

## 2013-04-29 NOTE — Patient Instructions (Signed)
Your physician has recommended you make the following change in your medication 1) Take Potassium every other day 2)Take Lasix 40mg  every other day  Your physician recommends that you return for lab work in: 10 days  Your physician recommends that you schedule a follow-up appointment in: 3 months

## 2013-04-29 NOTE — Progress Notes (Signed)
Patient ID: Juan Li, male   DOB: 1932-02-12, 79 y.o.   MRN: [id number] PCP: Dr. Leveda Anna  79 yo with history of AS s/p St Jude AVR and chronic diastolic CHF presents for cardiology followup.  His valve was replaced in 1993.  Echo in 10/13 showed normal EF and a well-seated mechanical valve.  Cardiac cath in 1993 showed nonobstructive disease.  He has been started on Namendo and Aricept for some degree of dementia.  Weight is up 3 lbs since last appointment.  I had wanted him to take Lasix every other day but he has not been taking it at all (has not been getting it in his weekly pillbox). He is short of breath walking in his yard or up stairs.  He has bendopnea.  He continues to have some gait instability.  Back and leg pain are limiting.  No recent falls.  No orthopnea, PND, or chest pain.   Labs (1/12): K 4.8, creatinine 1.3 Labs (12/12): creatinine 1.2, LDL 177 Labs (10/13): creatinine 1.6 => 1.1, LDL 135 Labs (12/13): LDL 74, HDL 30 Labs (4/14): K 4.6 => 4.8, creatinine 1.1 => 1.3, TSH normal, HCT 37.6, BNP 180 Labs (6/14): K 4.3, creatinine 1.3 Labs (8/14): K 4.8, creatinine 1.5, BNP 100 Labs (10/14): K 4.1, creatinine 1.3, BNP 184, LDL 100, HDL 35  PMH: 1. Dementia: On Aricept and Namenda.   2. Hyperlipidemia: myalgias with Vytorin, tolerating pravastatin 3. BPH 4. Osteoarthritis right knee, s/p TKR 10/13.  5. Cervical myelopathy  6. Obesity 7. HTN 8. Aortic stenosis: St. Jude mechanical aortic valve placed in 1993. Echo (10/13) with EF 65-70% and normal function of the aortic valve (mean gradient 12 mmHg).   9. Cardiac cath in 1993 with nonobstructive disease.  10. L-spine disease/spinal stenosis.  11. Abdominal ultrasound in 12/11 did not show AAA 12. Gout 13. Shoulder surgery 1/12 14. CKD 15. Chronic diastolic CHF: Echo (10/13) with EF 65-70%, normally-functioning mechanical aortic valve (mean gradient 12).    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   Does not smoke.   FH: No premature CAD.   ROS: All systems reviewed and negative except as per HPI.    Current Outpatient Prescriptions  Medication Sig Dispense Refill  . aspirin EC 81 MG tablet Take 81 mg by mouth every morning.       . donepezil (ARICEPT) 5 MG tablet TAKE 1 TABLET BY MOUTH EVERY DAY  90 tablet  3  . dutasteride (AVODART) 0.5 MG capsule Take 1 capsule (0.5 mg total) by mouth every evening.  90 capsule  3  . enalapril (VASOTEC) 20 MG tablet TAKE 1 TABLET (20 MG TOTAL) BY MOUTH DAILY.  90 tablet  3  . furosemide (LASIX) 40 MG tablet 1 tablet every other day  90 tablet  3  . HYDROcodone-acetaminophen (NORCO) 10-325 MG per tablet Take 1 tablet by mouth every 6 (six) hours as needed.  120 tablet  0  . metoprolol tartrate (LOPRESSOR) 25 MG tablet Take 1 tablet (25 mg total) by mouth 2 (two) times daily.  60 tablet  6  . NAMENDA 10 MG tablet TAKE 1 TABLET TWICE A DAY  180 tablet  3  . potassium chloride (K-DUR,KLOR-CON) 10 MEQ tablet Take 1 tablet (10 mEq total) by mouth once. Every other day with Lasix  30 tablet  6  . pravastatin (PRAVACHOL) 40 MG tablet Take 2 tablets (80 mg total) by mouth daily.  60 tablet  2  .  tamsulosin (FLOMAX) 0.4 MG CAPS capsule TAKE 1 CAPSULE DAILY  90 capsule  3  . warfarin (COUMADIN) 5 MG tablet TAKE AS DIRECTED  100 tablet  PRN   No current facility-administered medications for this visit.    BP 162/62  Pulse 56  Ht 5\' 7"  (1.702 m)  Wt 91.627 kg (202 lb)  BMI 31.63 kg/m2 General: NAD, obese Neck: JVP 8 cm with HJR, no thyromegaly or thyroid nodule.  Lungs: Clear to auscultation bilaterally with normal respiratory effort. CV: Nondisplaced PMI.  Heart regular S1/S2, mechanical S2, no S3/S4, 1/6 SEM RUSB.  1+ edema 1/2 up lower legs bilaterally.  No carotid bruit.  Normal pedal pulses.  Abdomen: Soft, nontender, no hepatosplenomegaly, no distention.  Neurologic: Alert and oriented x 3.  Psych: Normal affect. Extremities: No clubbing or  cyanosis.   Assessment/Plan:  Aortic valve disorders  Mechanical aortic valve.  No bleeding problems on coumadin. Continue ASA 81 also.   Valve appeared normal on 10/13 echo.  Crisp S2 on exam today.  HYPERCHOLESTEROLEMIA  Tolerating pravastatin.  Lipids a little higher than desired.  I asked him to watch his diet more closely.  HYPERTENSION BP high today.  However, given history of orthostatic hypotension and gait instability, I am going to hold off on increasing his antihypertensives. Chronic diastolic CHF He continues to appear volume overloaded on exam with NYHA class III symptoms.  He is not taking his Lasix.  I would like him to start lasix 40 mg and KCl 10 mEq, both every other day.  BMET/BNP in 2 wks.   Followup in 3 months.   [name] 04/29/2013

## 2013-05-04 ENCOUNTER — Other Ambulatory Visit: Payer: Self-pay | Admitting: Cardiology

## 2013-05-06 ENCOUNTER — Other Ambulatory Visit: Payer: Self-pay | Admitting: Cardiology

## 2013-05-07 ENCOUNTER — Other Ambulatory Visit: Payer: Self-pay | Admitting: Cardiology

## 2013-05-10 ENCOUNTER — Other Ambulatory Visit (INDEPENDENT_AMBULATORY_CARE_PROVIDER_SITE_OTHER): Payer: Medicare Other

## 2013-05-10 DIAGNOSIS — I10 Essential (primary) hypertension: Secondary | ICD-10-CM

## 2013-05-10 DIAGNOSIS — I5032 Chronic diastolic (congestive) heart failure: Secondary | ICD-10-CM | POA: Diagnosis not present

## 2013-05-10 DIAGNOSIS — R0602 Shortness of breath: Secondary | ICD-10-CM | POA: Diagnosis not present

## 2013-05-10 LAB — BASIC METABOLIC PANEL
BUN: 23 mg/dL (ref 6–23)
CALCIUM: 9 mg/dL (ref 8.4–10.5)
CO2: 25 mEq/L (ref 19–32)
CREATININE: 1.4 mg/dL (ref 0.4–1.5)
Chloride: 108 mEq/L (ref 96–112)
GFR: 53.21 mL/min — ABNORMAL LOW (ref >60.00)
GLUCOSE: 91 mg/dL (ref 70–99)
Potassium: 4 mEq/L (ref 3.5–5.1)
Sodium: 140 mEq/L (ref 135–145)

## 2013-05-10 LAB — BRAIN NATRIURETIC PEPTIDE: PRO B NATRI PEPTIDE: 204 pg/mL — AB (ref 0.0–100.0)

## 2013-05-14 ENCOUNTER — Telehealth: Payer: Self-pay | Admitting: Family Medicine

## 2013-05-14 DIAGNOSIS — M159 Polyosteoarthritis, unspecified: Secondary | ICD-10-CM

## 2013-05-14 NOTE — Telephone Encounter (Signed)
Pt called and needs a refill on his hydrocodone. jw

## 2013-05-15 MED ORDER — HYDROCODONE-ACETAMINOPHEN 10-325 MG PO TABS: 1.0000 | ORAL_TABLET | Freq: Four times a day (QID) | ORAL | Status: DC | PRN

## 2013-05-15 NOTE — Telephone Encounter (Signed)
LVM for patient to call back. ?

## 2013-05-16 NOTE — Telephone Encounter (Signed)
Spoke with patient's wife, she came yesterday and picked up

## 2013-06-10 ENCOUNTER — Ambulatory Visit (INDEPENDENT_AMBULATORY_CARE_PROVIDER_SITE_OTHER): Payer: Medicare Other | Admitting: Pharmacist

## 2013-06-10 DIAGNOSIS — Z952 Presence of prosthetic heart valve: Secondary | ICD-10-CM

## 2013-06-10 DIAGNOSIS — I359 Nonrheumatic aortic valve disorder, unspecified: Secondary | ICD-10-CM | POA: Diagnosis not present

## 2013-06-10 DIAGNOSIS — Z954 Presence of other heart-valve replacement: Secondary | ICD-10-CM

## 2013-06-10 DIAGNOSIS — Z7901 Long term (current) use of anticoagulants: Secondary | ICD-10-CM

## 2013-06-10 DIAGNOSIS — I350 Nonrheumatic aortic (valve) stenosis: Secondary | ICD-10-CM

## 2013-06-10 LAB — POCT INR: INR: 3.4

## 2013-06-21 ENCOUNTER — Encounter: Payer: Self-pay | Admitting: Family Medicine

## 2013-06-21 ENCOUNTER — Ambulatory Visit (INDEPENDENT_AMBULATORY_CARE_PROVIDER_SITE_OTHER): Payer: Medicare Other | Admitting: Family Medicine

## 2013-06-21 VITALS — BP 173/65 | HR 62 | Ht 67.0 in | Wt 202.0 lb

## 2013-06-21 DIAGNOSIS — Z23 Encounter for immunization: Secondary | ICD-10-CM | POA: Diagnosis not present

## 2013-06-21 DIAGNOSIS — Z7901 Long term (current) use of anticoagulants: Secondary | ICD-10-CM

## 2013-06-21 DIAGNOSIS — M159 Polyosteoarthritis, unspecified: Secondary | ICD-10-CM

## 2013-06-21 DIAGNOSIS — N400 Enlarged prostate without lower urinary tract symptoms: Secondary | ICD-10-CM | POA: Diagnosis not present

## 2013-06-21 DIAGNOSIS — Z952 Presence of prosthetic heart valve: Secondary | ICD-10-CM

## 2013-06-21 DIAGNOSIS — K219 Gastro-esophageal reflux disease without esophagitis: Secondary | ICD-10-CM

## 2013-06-21 DIAGNOSIS — E669 Obesity, unspecified: Secondary | ICD-10-CM | POA: Diagnosis not present

## 2013-06-21 DIAGNOSIS — Z954 Presence of other heart-valve replacement: Secondary | ICD-10-CM | POA: Diagnosis not present

## 2013-06-21 DIAGNOSIS — I10 Essential (primary) hypertension: Secondary | ICD-10-CM | POA: Diagnosis not present

## 2013-06-21 MED ORDER — ZOSTER VACCINE LIVE 19400 UNT/0.65ML ~~LOC~~ SOLR: 0.6500 mL | Freq: Once | SUBCUTANEOUS | Status: DC

## 2013-06-21 MED ORDER — HYDROCODONE-ACETAMINOPHEN 10-325 MG PO TABS: 1.0000 | ORAL_TABLET | Freq: Four times a day (QID) | ORAL | Status: DC | PRN

## 2013-06-21 MED ORDER — FAMOTIDINE 20 MG PO TABS: 20.0000 mg | ORAL_TABLET | Freq: Two times a day (BID) | ORAL | Status: DC

## 2013-06-21 MED ORDER — DOXAZOSIN MESYLATE 2 MG PO TABS: 2.0000 mg | ORAL_TABLET | Freq: Every day | ORAL | Status: DC

## 2013-06-21 NOTE — Assessment & Plan Note (Signed)
Managed by cards 

## 2013-06-21 NOTE — Progress Notes (Signed)
   Subjective:    Patient ID: Juan Li, male    DOB: 1931-08-01, 80 y.o.   MRN: [id number]  HPI Several issues Needs refill on pain meds, which allow him to stay mobile.  HPDP, I am not sure why he has never had a shingles vaccine - he believes his insurance will cover.  Also needs Prevnar. BP up and up at cards.  Wide pulse pressure suggests stiff vessels.   Weight creeping up.  Not as active in the winter.  Strongly encouraged wt loss Memory problems good on current meds.     Review of Systems     Objective:   Physical ExamLungs clear Cardicac RRR without m or g Abd benign.        Assessment & Plan:

## 2013-06-21 NOTE — Assessment & Plan Note (Signed)
Switch tamulosin to doxazocin for better BP control.

## 2013-06-21 NOTE — Assessment & Plan Note (Signed)
Strongly encouraged wt loss

## 2013-06-21 NOTE — Patient Instructions (Addendum)
[facility] A prescription for the shingles vaccine.  Call me when you get it so that I can update your records. 2.  A prescription for famotidine, which replaces the omeprazole for heartburn. 3. A new blood pressure medicine which also helps with the urine flow.  Doxazosin replaces the Tamulosin - stop it.    You are getting the second pneumonia shot from [place] - Prevnar. Let me know if you have any lightheaded spells. When you start taking the new blood pressure medicine, stand and wait before walking away from the chair or bed.  If you get light headed, I want you to be able to sit right back down. I'd like to see you again in 6 weeks to make sure your blood pressure is coming down and you are doing OK on the new medicine.

## 2013-06-21 NOTE — Assessment & Plan Note (Signed)
Given systolic >449 will switch from tamulosin to doxazocin for better control.

## 2013-06-21 NOTE — Assessment & Plan Note (Signed)
Refill pain meds 

## 2013-07-03 ENCOUNTER — Other Ambulatory Visit: Payer: Self-pay | Admitting: Family Medicine

## 2013-07-03 ENCOUNTER — Other Ambulatory Visit: Payer: Self-pay | Admitting: Cardiology

## 2013-07-15 ENCOUNTER — Ambulatory Visit: Payer: Medicare Other | Admitting: Cardiology

## 2013-07-15 ENCOUNTER — Encounter: Payer: Self-pay | Admitting: Cardiology

## 2013-07-15 ENCOUNTER — Ambulatory Visit (INDEPENDENT_AMBULATORY_CARE_PROVIDER_SITE_OTHER): Payer: Medicare Other | Admitting: Cardiology

## 2013-07-15 VITALS — BP 122/76 | HR 56 | Ht 67.0 in | Wt 201.0 lb

## 2013-07-15 DIAGNOSIS — I5032 Chronic diastolic (congestive) heart failure: Secondary | ICD-10-CM

## 2013-07-15 DIAGNOSIS — R0602 Shortness of breath: Secondary | ICD-10-CM | POA: Diagnosis not present

## 2013-07-15 DIAGNOSIS — I359 Nonrheumatic aortic valve disorder, unspecified: Secondary | ICD-10-CM | POA: Diagnosis not present

## 2013-07-15 DIAGNOSIS — I509 Heart failure, unspecified: Secondary | ICD-10-CM

## 2013-07-15 DIAGNOSIS — Z954 Presence of other heart-valve replacement: Secondary | ICD-10-CM

## 2013-07-15 DIAGNOSIS — E7800 Pure hypercholesterolemia, unspecified: Secondary | ICD-10-CM

## 2013-07-15 DIAGNOSIS — Z952 Presence of prosthetic heart valve: Secondary | ICD-10-CM

## 2013-07-15 MED ORDER — POTASSIUM CHLORIDE ER 10 MEQ PO TBCR: 10.0000 meq | EXTENDED_RELEASE_TABLET | Freq: Every day | ORAL | Status: DC

## 2013-07-15 MED ORDER — FUROSEMIDE 40 MG PO TABS: 40.0000 mg | ORAL_TABLET | Freq: Every day | ORAL | Status: DC

## 2013-07-15 MED ORDER — TAMSULOSIN HCL 0.4 MG PO CAPS: 0.4000 mg | ORAL_CAPSULE | Freq: Every day | ORAL | Status: DC

## 2013-07-15 NOTE — Patient Instructions (Addendum)
Increase lasix (furosemide) to 40mg  daily.   Increase KCL (potassium) to 10 mEq daily.  Stop cardura.  Start Flomax 0.4mg  daily. Call me if this is not the dose you were taking before--Juan Li, [employer]  Your physician recommends that you return for lab work in: 1 weeks--BMET/BNP.  Your physician recommends that you schedule a follow-up appointment in: 3 months with Dr Aundra Dubin.

## 2013-07-16 NOTE — Progress Notes (Signed)
Patient ID: Juan Li, male   DOB: Mar 03, 1932, 80 y.o.   MRN: [id number] PCP: Dr. Andria Frames  80 yo with history of AS s/p St Jude AVR and chronic diastolic CHF presents for cardiology followup.  His valve was replaced in 1993.  Echo in 10/13 showed normal EF and a well-seated mechanical valve.  Cardiac cath in 1993 showed nonobstructive disease.  He has been started on Namenda and Aricept for some degree of dementia.  At last appointment, I asked him to increase Lasix to 40 mg every other day due to volume overload.  He has been taking Lasix about 3 times a week.  Weight is down 1 lb.  He is still short of breath after walking about 50-100 feet.  He has bendopnea.  He continues to have some gait instability.  Back and leg pain are limiting.  No recent falls.  No orthopnea, PND, or chest pain.  Of note, he was recently taken off Flomax and put on Cardura due to elevated blood pressure.  Since then, he says that he has been "dizzy," especially with standing and walking.   Labs (1/12): K 4.8, creatinine 1.3 Labs (12/12): creatinine 1.2, LDL 177 Labs (10/13): creatinine 1.6 => 1.1, LDL 135 Labs (12/13): LDL 74, HDL 30 Labs (4/14): K 4.6 => 4.8, creatinine 1.1 => 1.3, TSH normal, HCT 37.6, BNP 180 Labs (6/14): K 4.3, creatinine 1.3 Labs (8/14): K 4.8, creatinine 1.5, BNP 100 Labs (10/14): K 4.1, creatinine 1.3, BNP 184, LDL 100, HDL 35 Labs (1/15): K 4, creatinine 1.4, BNP 204  PMH: 1. Dementia: On Aricept and Namenda.   2. Hyperlipidemia: myalgias with Vytorin, tolerating pravastatin 3. BPH 4. Osteoarthritis right knee, s/p TKR 10/13.  5. Cervical myelopathy  6. Obesity 7. HTN 8. Aortic stenosis: St. Jude mechanical aortic valve placed in 1993. Echo (10/13) with EF 65-70% and normal function of the aortic valve (mean gradient 12 mmHg).   9. Cardiac cath in 1993 with nonobstructive disease.  10. L-spine disease/spinal stenosis.  11. Abdominal ultrasound in 12/11 did not show AAA 12. Gout 13.  Shoulder surgery 1/12 14. CKD 15. Chronic diastolic CHF: Echo (42/35) with EF 65-70%, normally-functioning mechanical aortic valve (mean gradient 12).    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  Does not smoke.   FH: No premature CAD.   ROS: All systems reviewed and negative except as per HPI.    Current Outpatient Prescriptions  Medication Sig Dispense Refill  . aspirin EC 81 MG tablet Take 81 mg by mouth every morning.       . donepezil (ARICEPT) 5 MG tablet TAKE 1 TABLET BY MOUTH EVERY DAY  90 tablet  3  . dutasteride (AVODART) 0.5 MG capsule Take 1 capsule (0.5 mg total) by mouth every evening.  90 capsule  3  . enalapril (VASOTEC) 20 MG tablet TAKE 1 TABLET (20 MG TOTAL) BY MOUTH DAILY.  90 tablet  3  . famotidine (PEPCID) 20 MG tablet Take 1 tablet (20 mg total) by mouth 2 (two) times daily.  30 tablet  12  . HYDROcodone-acetaminophen (NORCO) 10-325 MG per tablet Take 1 tablet by mouth every 6 (six) hours as needed.  120 tablet  0  . metoprolol tartrate (LOPRESSOR) 25 MG tablet TAKE 1 TABLET (25 MG TOTAL) BY MOUTH 2 (TWO) TIMES DAILY.  60 tablet  6  . NAMENDA 10 MG tablet TAKE 1 TABLET TWICE A DAY  180 tablet  3  . pravastatin (  PRAVACHOL) 40 MG tablet Take 2 tablets (80 mg total) by mouth daily.  60 tablet  0  . warfarin (COUMADIN) 5 MG tablet TAKE AS DIRECTED  100 tablet  PRN  . zoster vaccine live, PF, (ZOSTAVAX) 36644 UNT/0.65ML injection Inject 19,400 Units into the skin once.  1 each  0  . furosemide (LASIX) 40 MG tablet Take 1 tablet (40 mg total) by mouth daily.  30 tablet  4  . potassium chloride (K-DUR) 10 MEQ tablet Take 1 tablet (10 mEq total) by mouth daily.  30 tablet  4  . tamsulosin (FLOMAX) 0.4 MG CAPS capsule Take 1 capsule (0.4 mg total) by mouth daily.  30 capsule  4   No current facility-administered medications for this visit.    BP 122/76  Pulse 56  Ht 5\' 7"  (1.702 m)  Wt 91.173 kg (201 lb)  BMI 31.47 kg/m2 General: NAD, obese Neck:  JVP 8 cm with HJR, no thyromegaly or thyroid nodule.  Lungs: Clear to auscultation bilaterally with normal respiratory effort. CV: Nondisplaced PMI.  Heart regular S1/S2, mechanical S2, no S3/S4, 1/6 SEM RUSB.  1+ edema to knees bilaterally.  No carotid bruit.  Normal pedal pulses.  Abdomen: Soft, nontender, no hepatosplenomegaly, no distention.  Neurologic: Alert and oriented x 3.  Psych: Normal affect. Extremities: No clubbing or cyanosis.   Assessment/Plan:  Aortic valve disorders  Mechanical aortic valve.  No bleeding problems on coumadin. Continue ASA 81 also.   Valve appeared normal on 10/13 echo.  Crisp S2 on exam today.  HYPERCHOLESTEROLEMIA  Tolerating pravastatin.  Lipids a little higher than desired.  I have asked him to watch his diet more closely.  HYPERTENSION Since changing from Flomax to Cardura, he has had worsened orthostatic symptoms.  Given fall risk, I think it would be best to go back to Flomax and stop Cardura.  He will make this change.   Chronic diastolic CHF He continues to appear volume overloaded on exam with NYHA class III symptoms.  Increase Lasix to 40 mg daily and KCL to 10 mEq daily.  BMET/BNP in 2 wks.    [name] 07/16/2013

## 2013-07-22 ENCOUNTER — Other Ambulatory Visit: Payer: Medicare Other

## 2013-07-23 ENCOUNTER — Other Ambulatory Visit (INDEPENDENT_AMBULATORY_CARE_PROVIDER_SITE_OTHER): Payer: Medicare Other

## 2013-07-23 ENCOUNTER — Ambulatory Visit (INDEPENDENT_AMBULATORY_CARE_PROVIDER_SITE_OTHER): Payer: Medicare Other

## 2013-07-23 DIAGNOSIS — I350 Nonrheumatic aortic (valve) stenosis: Secondary | ICD-10-CM

## 2013-07-23 DIAGNOSIS — R0602 Shortness of breath: Secondary | ICD-10-CM | POA: Diagnosis not present

## 2013-07-23 DIAGNOSIS — I359 Nonrheumatic aortic valve disorder, unspecified: Secondary | ICD-10-CM

## 2013-07-23 DIAGNOSIS — I5032 Chronic diastolic (congestive) heart failure: Secondary | ICD-10-CM | POA: Diagnosis not present

## 2013-07-23 DIAGNOSIS — Z954 Presence of other heart-valve replacement: Secondary | ICD-10-CM | POA: Diagnosis not present

## 2013-07-23 DIAGNOSIS — Z952 Presence of prosthetic heart valve: Secondary | ICD-10-CM

## 2013-07-23 DIAGNOSIS — Z7901 Long term (current) use of anticoagulants: Secondary | ICD-10-CM

## 2013-07-23 LAB — POCT INR: INR: 2.5

## 2013-07-24 ENCOUNTER — Other Ambulatory Visit: Payer: Self-pay | Admitting: Cardiology

## 2013-07-24 LAB — BASIC METABOLIC PANEL
BUN: 23 mg/dL (ref 6–23)
CHLORIDE: 104 meq/L (ref 96–112)
CO2: 27 meq/L (ref 19–32)
CREATININE: 1.4 mg/dL (ref 0.4–1.5)
Calcium: 9.4 mg/dL (ref 8.4–10.5)
GFR: 53.19 mL/min — ABNORMAL LOW (ref >60.00)
Glucose, Bld: 112 mg/dL — ABNORMAL HIGH (ref 70–99)
Potassium: 4.6 mEq/L (ref 3.5–5.1)
Sodium: 138 mEq/L (ref 135–145)

## 2013-07-24 LAB — BRAIN NATRIURETIC PEPTIDE: Pro B Natriuretic peptide (BNP): 110 pg/mL — ABNORMAL HIGH (ref 0.0–100.0)

## 2013-07-26 ENCOUNTER — Telehealth: Payer: Self-pay | Admitting: Family Medicine

## 2013-07-26 NOTE — Telephone Encounter (Signed)
Pt called to let Dr. Andria Frames know that the new medication he started taking was working a little until he started having dizzy spells. The another doctor told him to stop taking it for a while and to start taking his Tamsulosin until he has his next coumadin clinic and they would make a decision then. He also wanted you to know that he had his flu shot on 06/21/13. jw

## 2013-07-26 NOTE — Telephone Encounter (Signed)
Noted  

## 2013-07-29 ENCOUNTER — Telehealth: Payer: Self-pay | Admitting: Family Medicine

## 2013-07-29 NOTE — Telephone Encounter (Signed)
Correction .Patient would like you to read the article on the  back page in today's News and Record.Juan Li, Juan Li

## 2013-07-29 NOTE — Telephone Encounter (Signed)
Pt wife would like him read the article on the back article on the back side os todays life section in todays News [place]

## 2013-07-29 NOTE — Telephone Encounter (Signed)
Noted  

## 2013-08-03 ENCOUNTER — Other Ambulatory Visit: Payer: Self-pay | Admitting: Family Medicine

## 2013-08-09 ENCOUNTER — Telehealth: Payer: Self-pay | Admitting: Family Medicine

## 2013-08-09 NOTE — Telephone Encounter (Signed)
Pt called and would like a refill on his hydrocodone left up front. Please call him when it is ready for pickup. jw

## 2013-08-12 ENCOUNTER — Telehealth: Payer: Self-pay | Admitting: Family Medicine

## 2013-08-12 DIAGNOSIS — M159 Polyosteoarthritis, unspecified: Secondary | ICD-10-CM

## 2013-08-12 NOTE — Telephone Encounter (Signed)
Spoke with patient and informed him that Dr. Andria Frames is not here and doctor covering him will be here tomorrow so we can handle this tomorrow

## 2013-08-12 NOTE — Telephone Encounter (Signed)
Dear Dema Severin Team Madaline Brilliant I am out today w fam emergency so I can fill it tomorrow when I get back or if he needs it today maybe a preceptor can fill it for him' ,tt Dickie La

## 2013-08-12 NOTE — Telephone Encounter (Signed)
That's fine.patient states he has enough for tomorrow.Thank you.[place]

## 2013-08-12 NOTE — Telephone Encounter (Signed)
Pt called and wanted to know what the status of his pain medication refill request. He is down to 2 pills and really needs this filled. jw

## 2013-08-13 MED ORDER — HYDROCODONE-ACETAMINOPHEN 10-325 MG PO TABS: 1.0000 | ORAL_TABLET | Freq: Four times a day (QID) | ORAL | Status: DC | PRN

## 2013-08-13 NOTE — Telephone Encounter (Signed)
Tia informed patient that rx up front

## 2013-08-13 NOTE — Telephone Encounter (Signed)
Dear Dema Severin Team [place] it is up front [place]! Dickie La

## 2013-08-13 NOTE — Telephone Encounter (Signed)
Dear Dema Severin Team I am giving this to you Please let himknow and sorry for any inconvenience THANKS! Dickie La

## 2013-08-13 NOTE — Telephone Encounter (Signed)
LVM for patient to call back. ?

## 2013-08-13 NOTE — Telephone Encounter (Signed)
RX placed up front for pickup

## 2013-08-14 NOTE — Telephone Encounter (Signed)
error 

## 2013-09-03 ENCOUNTER — Ambulatory Visit (INDEPENDENT_AMBULATORY_CARE_PROVIDER_SITE_OTHER): Payer: Medicare Other | Admitting: Pharmacist

## 2013-09-03 DIAGNOSIS — I359 Nonrheumatic aortic valve disorder, unspecified: Secondary | ICD-10-CM

## 2013-09-03 DIAGNOSIS — Z952 Presence of prosthetic heart valve: Secondary | ICD-10-CM

## 2013-09-03 DIAGNOSIS — I350 Nonrheumatic aortic (valve) stenosis: Secondary | ICD-10-CM

## 2013-09-03 DIAGNOSIS — Z954 Presence of other heart-valve replacement: Secondary | ICD-10-CM

## 2013-09-03 DIAGNOSIS — Z7901 Long term (current) use of anticoagulants: Secondary | ICD-10-CM

## 2013-09-03 LAB — POCT INR: INR: 4

## 2013-09-26 ENCOUNTER — Telehealth: Payer: Self-pay | Admitting: Family Medicine

## 2013-09-26 DIAGNOSIS — M159 Polyosteoarthritis, unspecified: Secondary | ICD-10-CM

## 2013-09-26 MED ORDER — HYDROCODONE-ACETAMINOPHEN 10-325 MG PO TABS: 1.0000 | ORAL_TABLET | Freq: Four times a day (QID) | ORAL | Status: DC | PRN

## 2013-09-26 NOTE — Telephone Encounter (Signed)
Pt called and needs a refill on his hydrocodone left up front. Please call when ready. jw °

## 2013-09-26 NOTE — Telephone Encounter (Signed)
Patient informed.[place]

## 2013-09-26 NOTE — Telephone Encounter (Signed)
Done

## 2013-09-27 ENCOUNTER — Other Ambulatory Visit: Payer: Self-pay | Admitting: *Deleted

## 2013-10-04 ENCOUNTER — Encounter: Payer: Self-pay | Admitting: Cardiology

## 2013-10-04 ENCOUNTER — Ambulatory Visit (INDEPENDENT_AMBULATORY_CARE_PROVIDER_SITE_OTHER): Payer: Medicare Other | Admitting: Cardiology

## 2013-10-04 ENCOUNTER — Ambulatory Visit (INDEPENDENT_AMBULATORY_CARE_PROVIDER_SITE_OTHER): Payer: Medicare Other

## 2013-10-04 ENCOUNTER — Encounter (INDEPENDENT_AMBULATORY_CARE_PROVIDER_SITE_OTHER): Payer: Self-pay

## 2013-10-04 VITALS — BP 185/67 | HR 51 | Ht 67.0 in | Wt 202.0 lb

## 2013-10-04 DIAGNOSIS — Z952 Presence of prosthetic heart valve: Secondary | ICD-10-CM

## 2013-10-04 DIAGNOSIS — I350 Nonrheumatic aortic (valve) stenosis: Secondary | ICD-10-CM

## 2013-10-04 DIAGNOSIS — I5032 Chronic diastolic (congestive) heart failure: Secondary | ICD-10-CM

## 2013-10-04 DIAGNOSIS — I498 Other specified cardiac arrhythmias: Secondary | ICD-10-CM

## 2013-10-04 DIAGNOSIS — I359 Nonrheumatic aortic valve disorder, unspecified: Secondary | ICD-10-CM

## 2013-10-04 DIAGNOSIS — Z954 Presence of other heart-valve replacement: Secondary | ICD-10-CM

## 2013-10-04 DIAGNOSIS — R001 Bradycardia, unspecified: Secondary | ICD-10-CM

## 2013-10-04 DIAGNOSIS — R0602 Shortness of breath: Secondary | ICD-10-CM | POA: Diagnosis not present

## 2013-10-04 DIAGNOSIS — I509 Heart failure, unspecified: Secondary | ICD-10-CM

## 2013-10-04 DIAGNOSIS — Z7901 Long term (current) use of anticoagulants: Secondary | ICD-10-CM

## 2013-10-04 LAB — POCT INR: INR: 3.8

## 2013-10-04 MED ORDER — POTASSIUM CHLORIDE ER 10 MEQ PO TBCR: 10.0000 meq | EXTENDED_RELEASE_TABLET | Freq: Every day | ORAL | Status: DC

## 2013-10-04 MED ORDER — FUROSEMIDE 40 MG PO TABS: 40.0000 mg | ORAL_TABLET | Freq: Every day | ORAL | Status: DC

## 2013-10-04 NOTE — Patient Instructions (Signed)
Your physician recommends that you continue on your current medications as directed. Please refer to the Current Medication list given to you today.  Resume taking Lasix 40mg  daily and Potassium 67meq daily. An Rx has been sent to your pharmacy.  Your physician has recommended that you wear a holter monitor. Holter monitors are medical devices that record the heart's electrical activity. Doctors most often use these monitors to diagnose arrhythmias. Arrhythmias are problems with the speed or rhythm of the heartbeat. The monitor is a small, portable device. You can wear one while you do your normal daily activities. This is usually used to diagnose what is causing palpitations/syncope (passing out).   Your physician wants you to follow-up in: 4 months You will receive a reminder letter in the mail two months in advance. If you don't receive a letter, please call our office to schedule the follow-up appointment.

## 2013-10-06 DIAGNOSIS — R001 Bradycardia, unspecified: Secondary | ICD-10-CM

## 2013-10-06 HISTORY — DX: Bradycardia, unspecified: R00.1

## 2013-10-06 NOTE — Progress Notes (Signed)
Patient ID: Juan Li, male   DOB: [birth year], 80 y.o.   MRN: [id number] PCP: Dr. Andria Frames  80 yo with history of AS s/p St Jude AVR and chronic diastolic CHF presents for cardiology followup.  His valve was replaced in 1993.  Echo in 10/13 showed normal EF and a well-seated mechanical valve.  Cardiac cath in 1993 showed nonobstructive disease.  He has been started on Namenda and Aricept for some degree of dementia.  At prior appointments, I asked him to increase Lasix to 40 mg every other day due to volume overload.  He is still not taking Lasix regularly.  Weight is up 1 lb.  He is still short of breath after walking about 50-100 feet.  He has bendopnea.  He continues to have some gait instability.  Back and leg pain are limiting.  No recent falls.  No orthopnea, PND, or chest pain. He continues to get lightheaded at times with standing and has had some falls.  He is using a cane or walker.  No chest pain.   Labs (1/12): K 4.8, creatinine 1.3 Labs (12/12): creatinine 1.2, LDL 177 Labs (10/13): creatinine 1.6 => 1.1, LDL 135 Labs (12/13): LDL 74, HDL 30 Labs (4/14): K 4.6 => 4.8, creatinine 1.1 => 1.3, TSH normal, HCT 37.6, BNP 180 Labs (6/14): K 4.3, creatinine 1.3 Labs (8/14): K 4.8, creatinine 1.5, BNP 100 Labs (10/14): K 4.1, creatinine 1.3, BNP 184, LDL 100, HDL 35 Labs (1/15): K 4, creatinine 1.4, BNP 204 Labs (3/15): K 4.6, creatinine 1.4, BNP 110  ECG; NSR, left axis deviation, anterolateral and inferior T wave inversions  PMH: 1. Dementia: On Aricept and Namenda.   2. Hyperlipidemia: myalgias with Vytorin, tolerating pravastatin 3. BPH 4. Osteoarthritis right knee, s/p TKR 10/13.  5. Cervical myelopathy  6. Obesity 7. HTN 8. Aortic stenosis: St. Jude mechanical aortic valve placed in 1993. Echo (10/13) with EF 65-70% and normal function of the aortic valve (mean gradient 12 mmHg).   9. Cardiac cath in 1993 with nonobstructive disease.  10. L-spine disease/spinal stenosis.   11. Abdominal ultrasound in 12/11 did not show AAA 12. Gout 13. Shoulder surgery 1/12 14. CKD 15. Chronic diastolic CHF: Echo (96/29) with EF 65-70%, normally-functioning mechanical aortic valve (mean gradient 12).    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  Married.  Does not smoke.   FH: No premature CAD.   ROS: All systems reviewed and negative except as per HPI.    Current Outpatient Prescriptions  Medication Sig Dispense Refill  . aspirin EC 81 MG tablet Take 81 mg by mouth every morning.       . donepezil (ARICEPT) 5 MG tablet ONE BY MOUTH DAILY  90 tablet  3  . dutasteride (AVODART) 0.5 MG capsule Take 1 capsule (0.5 mg total) by mouth every evening.  90 capsule  3  . enalapril (VASOTEC) 20 MG tablet TAKE 1 TABLET (20 MG TOTAL) BY MOUTH DAILY.  90 tablet  3  . famotidine (PEPCID) 20 MG tablet Take 1 tablet (20 mg total) by mouth 2 (two) times daily.  30 tablet  12  . furosemide (LASIX) 40 MG tablet Take 1 tablet (40 mg total) by mouth daily.  30 tablet  11  . HYDROcodone-acetaminophen (NORCO) 10-325 MG per tablet Take 1 tablet by mouth every 6 (six) hours as needed.  120 tablet  0  . metoprolol tartrate (LOPRESSOR) 25 MG tablet TAKE 1 TABLET (25 MG TOTAL) BY MOUTH 2 (  TWO) TIMES DAILY.  60 tablet  6  . NAMENDA 10 MG tablet TAKE 1 TABLET TWICE A DAY  180 tablet  3  . potassium chloride (K-DUR) 10 MEQ tablet Take 1 tablet (10 mEq total) by mouth daily.  30 tablet  11  . pravastatin (PRAVACHOL) 40 MG tablet Take 2 tablets (80 mg total) by mouth daily.  60 tablet  2  . tamsulosin (FLOMAX) 0.4 MG CAPS capsule Take 1 capsule (0.4 mg total) by mouth daily.  30 capsule  4  . warfarin (COUMADIN) 5 MG tablet TAKE AS DIRECTED  100 tablet  PRN   No current facility-administered medications for this visit.    BP 185/67  Pulse 51  Ht 5\' 7"  (1.702 m)  Wt 91.627 kg (202 lb)  BMI 31.63 kg/m2 General: NAD, overweight Neck: Thick, JVP difficult, no thyromegaly or thyroid nodule.   Lungs: Clear to auscultation bilaterally with normal respiratory effort. CV: Nondisplaced PMI.  Heart regular S1/S2, mechanical S2, no S3/S4, 1/6 SEM RUSB.  1+ edema 1/2 to knees bilaterally.  No carotid bruit.  Normal pedal pulses.  Abdomen: Soft, nontender, no hepatosplenomegaly, no distention.  Neurologic: Alert and oriented x 3.  Psych: Normal affect. Extremities: No clubbing or cyanosis.   Assessment/Plan:  Aortic valve disorders  Mechanical aortic valve.  No bleeding problems on coumadin. Continue ASA 81 also.   Valve appeared normal on 10/13 echo.  Crisp S2 on exam today.  HYPERCHOLESTEROLEMIA  Tolerating pravastatin.  Lipids a little higher than desired.  I have asked him to watch his diet more closely.  HYPERTENSION BP elevated today but usually significantly lower than this at home.  Given dizziness and falls, I would hold off on increasing BP meds.   Chronic diastolic CHF He continues to appear volume overloaded on exam with NYHA class III symptoms.  Increase Lasix to 40 mg daily and KCL to 10 mEq daily.  BMET/BNP in 2 wks.  Bradycardia Patient is bradycardic.  Given lightheadedness, will get 24 hour holter to look for significant bradyarrhythmias.     Larey Dresser 10/06/2013

## 2013-10-09 ENCOUNTER — Encounter (INDEPENDENT_AMBULATORY_CARE_PROVIDER_SITE_OTHER): Payer: Medicare Other

## 2013-10-09 ENCOUNTER — Encounter: Payer: Self-pay | Admitting: *Deleted

## 2013-10-09 DIAGNOSIS — I498 Other specified cardiac arrhythmias: Secondary | ICD-10-CM | POA: Diagnosis not present

## 2013-10-09 DIAGNOSIS — I495 Sick sinus syndrome: Secondary | ICD-10-CM

## 2013-10-09 DIAGNOSIS — R001 Bradycardia, unspecified: Secondary | ICD-10-CM

## 2013-10-09 NOTE — Progress Notes (Signed)
Patient ID: Juan Li, male   DOB: Dec 30, 1931, 80 y.o.   MRN: [id number] EVO 24 hour holter monitor applied to patient.

## 2013-10-11 ENCOUNTER — Telehealth: Payer: Self-pay | Admitting: *Deleted

## 2013-10-11 ENCOUNTER — Encounter: Payer: Self-pay | Admitting: *Deleted

## 2013-10-11 DIAGNOSIS — I359 Nonrheumatic aortic valve disorder, unspecified: Secondary | ICD-10-CM

## 2013-10-11 DIAGNOSIS — I5032 Chronic diastolic (congestive) heart failure: Secondary | ICD-10-CM

## 2013-10-11 DIAGNOSIS — I471 Supraventricular tachycardia: Secondary | ICD-10-CM

## 2013-10-11 NOTE — Telephone Encounter (Signed)
Dr Aundra Dubin reviewed monitor done 10/09/13  Run NSVT 17 beats Short SVT runs (atrial tachy) Too bradycardiac to increase B blocker. Check BMET and Mag. Would arrange for lexiscan myoview.  Discussed with patient, pt verbalized understanding, agreed with plan.

## 2013-10-11 NOTE — Telephone Encounter (Signed)
Discussed with patient's wife also  

## 2013-10-16 ENCOUNTER — Encounter: Payer: Self-pay | Admitting: Cardiology

## 2013-10-16 ENCOUNTER — Encounter: Payer: Self-pay | Admitting: Family Medicine

## 2013-10-16 ENCOUNTER — Ambulatory Visit (INDEPENDENT_AMBULATORY_CARE_PROVIDER_SITE_OTHER): Payer: Medicare Other | Admitting: Family Medicine

## 2013-10-16 ENCOUNTER — Telehealth: Payer: Self-pay | Admitting: Cardiology

## 2013-10-16 VITALS — BP 180/67 | HR 62 | Temp 98.1°F | Wt 200.0 lb

## 2013-10-16 DIAGNOSIS — M109 Gout, unspecified: Secondary | ICD-10-CM | POA: Diagnosis not present

## 2013-10-16 MED ORDER — NAPROXEN 500 MG PO TABS
500.0000 mg | ORAL_TABLET | Freq: Two times a day (BID) | ORAL | Status: DC
Start: 2013-10-16 — End: 2014-06-10

## 2013-10-16 MED ORDER — COLCHICINE 0.6 MG PO CAPS: ORAL_CAPSULE | ORAL | Status: DC

## 2013-10-16 NOTE — Telephone Encounter (Signed)
New Message:  Pt states something bit him on his left big toe. Pt states it is very sore, red and swollen. Pt wants to know if that will effect his stress test any.. Pt is requesting to speak with the nurse.

## 2013-10-16 NOTE — Patient Instructions (Signed)
Gout  Gout is when your joints become red, sore, and swell (inflammed). This is caused by the buildup of uric acid crystals in the joints. Uric acid is a chemical that is normally in the blood. If the level of uric acid gets too high in the blood, these crystals form in your joints and tissues. Over time, these crystals can form into masses near the joints and tissues. These masses can destroy bone and cause the bone to look misshapen (deformed).  HOME CARE   · Do not take aspirin for pain.  · Only take medicine as told by your doctor.  · Rest the joint as much as you can. When in bed, keep sheets and blankets off painful areas.  · Keep the sore joints raised (elevated).  · Put warm or cold packs on painful joints. Use of warm or cold packs depends on which works best for you.  · Use crutches if the painful joint is in your leg.  · Drink enough fluids to keep your pee (urine) clear or pale yellow. Limit alcohol, sugary drinks, and drinks with fructose in them.  · Follow your diet instructions. Pay careful attention to how much protein you eat. Include fruits, vegetables, whole grains, and fat-free or low-fat milk products in your daily diet. Talk to your doctor or dietician about the use of coffee, vitamin C, and cherries. These may help lower uric acid levels.  · Keep a healthy body weight.  GET HELP RIGHT AWAY IF:   · You have watery poop (diarrhea), throw up (vomit), or have any side effects from medicines.  · You do not feel better in 24 hours, or you are getting worse.  · Your joint becomes suddenly more tender, and you have chills or a fever.  MAKE SURE YOU:   · Understand these instructions.  · Will watch your condition.  · Will get help right away if you are not doing well or get worse.  Document Released: 01/26/2008 Document Revised: 08/13/2012 Document Reviewed: 07/27/2009  ExitCare® Patient Information ©2015 [employer]. This information is not intended to replace advice given to you by your health care  Shaylea Ucci. Make sure you discuss any questions you have with your health care Timur Nibert.

## 2013-10-16 NOTE — Progress Notes (Signed)
Patient ID: Juan Li, male   DOB: [birth year], 80 y.o.   MRN: [id number]    Subjective: HPI: Patient is a 80 y.o. male presenting to clinic today for left great toe pain.  Gout Patient here for evaluation of acute gouty arthritis. The patient reports never having gout attack before, however it is on his problem list with an elevated uric acid in 2013. Attacks occur primarily in the left first MTP joint. He has joint stiffness  and his joint swelling. Limitation on activities include none. The patient does not drink many alcoholic drinks. He does eat some high purine foods.  Since current gout started 1 week ago, stable since acute onset. He takes Norco daily which does not help this pain.  History Reviewed: Former smoker.  ROS: Please see HPI above.  Objective: Office vital signs reviewed. BP 180/67  Pulse 62  Temp(Src) 98.1 F (36.7 C) (Oral)  Wt 200 lb (90.719 kg)  Physical Examination:  General: Awake, alert. NAD HEENT: Atraumatic, normocephalic Extremities: Left great toe with swelling and redness at 1st MTP. Tenderness and warmth to the touch. Decreased ROM Neuro: Strength and sensation grossly intact  Assessment: 80 y.o. male with joint pain, most likely gout  Plan: See Problem List and After Visit Summary

## 2013-10-16 NOTE — Telephone Encounter (Signed)
Should not affect Lexiscan

## 2013-10-16 NOTE — Telephone Encounter (Signed)
Pt calling because he was bit by an insect on his left great toe and now its infected. Pt has not seen his PCP yet for the toe infection.  Pt wanting to know if the infected toe will affect his upcoming Lexiscan myoview.  Advised pt that it should not affect his myoview, for he will not have to exercise.  Did advise the pt to go to his PCP today, for further evaluation of his left great toe infection.  Informed the pt that Dr Aundra Dubin and nurse are out of the office today, but I will forward this message to them for further review.  Pt verbalized understanding and agrees with this plan.

## 2013-10-17 ENCOUNTER — Ambulatory Visit: Payer: Medicare Other | Admitting: Family Medicine

## 2013-10-17 NOTE — Telephone Encounter (Signed)
Left message for pt of dr mclean's recommendations.

## 2013-10-17 NOTE — Assessment & Plan Note (Signed)
A: Exam most consistent with acute gout flare, although patient states he has never had this before. Other Ddx includes cellulitis, infection of joint, or bug bite as patient suggested. I think without other symptoms such as fever or increasing redness, these are less likely.  P: - Naprosyn BID while painful - Colchicine TID x2 days, then daily until fully resolved or can d/c if he has GI upset - F/u with PCP if fails to improve or if he has recurrent attacks to discuss chronic medications for gout

## 2013-10-18 ENCOUNTER — Other Ambulatory Visit: Payer: Medicare Other

## 2013-10-21 ENCOUNTER — Other Ambulatory Visit: Payer: Self-pay | Admitting: *Deleted

## 2013-10-21 ENCOUNTER — Ambulatory Visit (HOSPITAL_COMMUNITY): Payer: Medicare Other | Attending: Cardiology | Admitting: Radiology

## 2013-10-21 ENCOUNTER — Ambulatory Visit (INDEPENDENT_AMBULATORY_CARE_PROVIDER_SITE_OTHER): Payer: Medicare Other | Admitting: *Deleted

## 2013-10-21 ENCOUNTER — Other Ambulatory Visit: Payer: Medicare Other

## 2013-10-21 VITALS — BP 147/92 | HR 51 | Ht 67.0 in | Wt 202.0 lb

## 2013-10-21 DIAGNOSIS — Z952 Presence of prosthetic heart valve: Secondary | ICD-10-CM

## 2013-10-21 DIAGNOSIS — R079 Chest pain, unspecified: Secondary | ICD-10-CM

## 2013-10-21 DIAGNOSIS — Z87891 Personal history of nicotine dependence: Secondary | ICD-10-CM | POA: Diagnosis not present

## 2013-10-21 DIAGNOSIS — R0609 Other forms of dyspnea: Secondary | ICD-10-CM | POA: Insufficient documentation

## 2013-10-21 DIAGNOSIS — I359 Nonrheumatic aortic valve disorder, unspecified: Secondary | ICD-10-CM

## 2013-10-21 DIAGNOSIS — Z7901 Long term (current) use of anticoagulants: Secondary | ICD-10-CM

## 2013-10-21 DIAGNOSIS — I350 Nonrheumatic aortic (valve) stenosis: Secondary | ICD-10-CM

## 2013-10-21 DIAGNOSIS — I2510 Atherosclerotic heart disease of native coronary artery without angina pectoris: Secondary | ICD-10-CM | POA: Insufficient documentation

## 2013-10-21 DIAGNOSIS — I471 Supraventricular tachycardia: Secondary | ICD-10-CM

## 2013-10-21 DIAGNOSIS — I5032 Chronic diastolic (congestive) heart failure: Secondary | ICD-10-CM

## 2013-10-21 DIAGNOSIS — I10 Essential (primary) hypertension: Secondary | ICD-10-CM | POA: Insufficient documentation

## 2013-10-21 DIAGNOSIS — Z8249 Family history of ischemic heart disease and other diseases of the circulatory system: Secondary | ICD-10-CM | POA: Insufficient documentation

## 2013-10-21 DIAGNOSIS — R42 Dizziness and giddiness: Secondary | ICD-10-CM | POA: Insufficient documentation

## 2013-10-21 DIAGNOSIS — I509 Heart failure, unspecified: Secondary | ICD-10-CM | POA: Diagnosis not present

## 2013-10-21 DIAGNOSIS — I779 Disorder of arteries and arterioles, unspecified: Secondary | ICD-10-CM | POA: Insufficient documentation

## 2013-10-21 DIAGNOSIS — R0602 Shortness of breath: Secondary | ICD-10-CM

## 2013-10-21 DIAGNOSIS — R0989 Other specified symptoms and signs involving the circulatory and respiratory systems: Secondary | ICD-10-CM | POA: Insufficient documentation

## 2013-10-21 DIAGNOSIS — Z954 Presence of other heart-valve replacement: Secondary | ICD-10-CM | POA: Diagnosis not present

## 2013-10-21 LAB — POCT INR: INR: 2.7

## 2013-10-21 MED ORDER — TECHNETIUM TC 99M SESTAMIBI GENERIC - CARDIOLITE
11.0000 | Freq: Once | INTRAVENOUS | Status: AC | PRN
  Administered 2013-10-21: 11 via INTRAVENOUS

## 2013-10-21 MED ORDER — METOPROLOL TARTRATE 25 MG PO TABS: ORAL_TABLET | ORAL | Status: DC

## 2013-10-21 MED ORDER — TAMSULOSIN HCL 0.4 MG PO CAPS: 0.4000 mg | ORAL_CAPSULE | Freq: Every day | ORAL | Status: DC

## 2013-10-21 MED ORDER — AMINOPHYLLINE 25 MG/ML IV SOLN
75.0000 mg | Freq: Once | INTRAVENOUS | Status: AC
  Administered 2013-10-21: 75 mg via INTRAVENOUS

## 2013-10-21 MED ORDER — TECHNETIUM TC 99M SESTAMIBI GENERIC - CARDIOLITE
33.0000 | Freq: Once | INTRAVENOUS | Status: AC | PRN
  Administered 2013-10-21: 33 via INTRAVENOUS

## 2013-10-21 MED ORDER — REGADENOSON 0.4 MG/5ML IV SOLN
0.4000 mg | Freq: Once | INTRAVENOUS | Status: AC
  Administered 2013-10-21: 0.4 mg via INTRAVENOUS

## 2013-10-21 NOTE — Progress Notes (Addendum)
[facility] [address] [place], [place] [address] [phone]    Cardiology Nuclear Med Study  Juan Li is a 80 y.o. male     MRN : [id number]     DOB: 10-Oct-1931  Procedure Date: 10/21/2013  Nuclear Med Background Indication for Stress Test:  Evaluation for Ischemia History:  CAD, Cath (n/o dz), AVR (hx AS), Echo 2013 EF 65-70%, CHF, MPI 2009 (normal) EF 60%, CKD, Dementia Cardiac Risk Factors: Carotid Disease, Family History - CAD, History of Smoking, Hypertension and Lipids  Symptoms:  Dizziness and DOE   Nuclear Pre-Procedure Caffeine/Decaff Intake:  None> 12 hrs NPO After: 7:00pm   Lungs:  clear O2 Sat: 97% on room air. IV 0.9% NS with Angio Cath:  22g  IV Site: L Antecubital x 1, tolerated well IV Started by:  Irven Baltimore, RN  Chest Size (in):  44 Cup Size: n/a  Height: 5\' 7"  (1.702 m)  Weight:  202 lb (91.627 kg)  BMI:  Body mass index is 31.63 kg/(m^2). Tech Comments:  Patient took Metoprolol this am. Irven Baltimore, RN.    Nuclear Med Study 1 or 2 day study: 1 day  Stress Test Type:  Carlton Adam  Reading MD: N/A  Order Authorizing Provider:  [name], MD  Resting Radionuclide: Technetium 45m Sestamibi  Resting Radionuclide Dose: 11.0 mCi   Stress Radionuclide:  Technetium 26m Sestamibi  Stress Radionuclide Dose: 33.0 mCi           Stress Protocol Rest HR: 51 Stress HR: 71  Rest BP: 147/92 Stress BP: 169/66  Exercise Time (min): n/a METS: n/a           Dose of Adenosine (mg):  n/a Dose of Lexiscan: 0.4 mg  Dose of Atropine (mg): n/a Dose of Dobutamine: n/a mcg/kg/min (at max HR)  Stress Test Technologist: Glade Lloyd, BS-ES  Nuclear Technologist:  Vedia Pereyra, CNMT     Rest Procedure:  Myocardial perfusion imaging was performed at rest 45 minutes following the intravenous administration of Technetium 45m Sestamibi. Rest ECG: Sinus bradycardia  51 bpm.   Septal MI. Sl ST depression in the inferior and  lateral leads.    Stress Procedure:  The patient received IV Lexiscan 0.4 mg over 15-seconds.  Technetium 47m Sestamibi injected at 30-seconds.  Quantitative spect images were obtained after a 45 minute delay.  During the infusion of Lexiscan, the patient complained of chest tightness, SOB, dizziness, nausea, headache and had diarrhea.  Administered 75 mg aminophylline and the patients symptoms began to resolve within a couple of minutes.  Stress ECG:No significant change from the baseline EKG (less than 1 mm depression)  QPS Raw Data Images: SOft tissue (diaphragm, bowel activity) underlie heart.   Stress Images:  Thinning with decreased tracer counts in the inferior wall (base, mid,) inferolateral wall (base, mid) and apex.    Ohterwise normal perfusion.   Rest Images:  Comparison with the stress images reveals no significant change. Subtraction (SDS):  No evidence of ischemia. Transient Ischemic Dilatation (Normal <1.22):  1.07 Lung/Heart Ratio (Normal <0.45):  0.32  Quantitative Gated Spect Images QGS EDV:  106 ml QGS ESV:  47 ml  Impression Exercise Capacity:  Lexiscan with no exercise. BP Response:  Hypertensive response.   Clinical Symptoms:  Mild chest pain/dyspnea. ECG Impression:  No significant ST segment change suggestive of ischemia. Comparison with Prior Nuclear Study: normal nuclear scan in 2009.  Overall Impression:  Probable normal perfusion and soft  tissue attenuation (bowel, diaphragm)  Cannot completely  r/o subendocardial scar.  No ischemia.  SInce previous study, inferior thinning more prominent.    LV Ejection Fraction: 56%.  LV Wall Motion:  NL LV Function; NL Wall Motion  [name]  Probably normal.  No ischemia, suspect some bowel attenuation.  EF normal.  Low risk.   [name] 10/22/2013

## 2013-10-22 NOTE — Progress Notes (Signed)
Pt.notified

## 2013-11-11 ENCOUNTER — Ambulatory Visit (INDEPENDENT_AMBULATORY_CARE_PROVIDER_SITE_OTHER): Payer: Medicare Other | Admitting: Pharmacist

## 2013-11-11 DIAGNOSIS — I359 Nonrheumatic aortic valve disorder, unspecified: Secondary | ICD-10-CM | POA: Diagnosis not present

## 2013-11-11 DIAGNOSIS — Z954 Presence of other heart-valve replacement: Secondary | ICD-10-CM

## 2013-11-11 DIAGNOSIS — Z7901 Long term (current) use of anticoagulants: Secondary | ICD-10-CM | POA: Diagnosis not present

## 2013-11-11 DIAGNOSIS — I350 Nonrheumatic aortic (valve) stenosis: Secondary | ICD-10-CM

## 2013-11-11 DIAGNOSIS — Z952 Presence of prosthetic heart valve: Secondary | ICD-10-CM

## 2013-11-11 LAB — POCT INR: INR: 3.2

## 2013-11-13 ENCOUNTER — Telehealth: Payer: Self-pay | Admitting: Family Medicine

## 2013-11-13 DIAGNOSIS — M159 Polyosteoarthritis, unspecified: Secondary | ICD-10-CM

## 2013-11-13 NOTE — Telephone Encounter (Signed)
Refill request for Hydrocodone. Call for pick up.

## 2013-11-14 MED ORDER — HYDROCODONE-ACETAMINOPHEN 10-325 MG PO TABS: 1.0000 | ORAL_TABLET | Freq: Four times a day (QID) | ORAL | Status: DC | PRN

## 2013-11-14 NOTE — Telephone Encounter (Signed)
Called and LM that Rx available to be picked up.

## 2013-11-27 ENCOUNTER — Encounter: Payer: Self-pay | Admitting: Family Medicine

## 2013-11-27 ENCOUNTER — Other Ambulatory Visit: Payer: Self-pay | Admitting: Family Medicine

## 2013-11-27 ENCOUNTER — Ambulatory Visit (INDEPENDENT_AMBULATORY_CARE_PROVIDER_SITE_OTHER): Payer: Medicare Other | Admitting: Family Medicine

## 2013-11-27 VITALS — BP 160/60 | HR 54 | Temp 98.1°F | Ht 67.0 in | Wt 198.5 lb

## 2013-11-27 DIAGNOSIS — R5381 Other malaise: Secondary | ICD-10-CM | POA: Diagnosis not present

## 2013-11-27 DIAGNOSIS — M159 Polyosteoarthritis, unspecified: Secondary | ICD-10-CM

## 2013-11-27 DIAGNOSIS — R269 Unspecified abnormalities of gait and mobility: Secondary | ICD-10-CM

## 2013-11-27 DIAGNOSIS — I10 Essential (primary) hypertension: Secondary | ICD-10-CM | POA: Diagnosis not present

## 2013-11-27 DIAGNOSIS — R5383 Other fatigue: Principal | ICD-10-CM

## 2013-11-27 DIAGNOSIS — E559 Vitamin D deficiency, unspecified: Secondary | ICD-10-CM

## 2013-11-27 LAB — COMPREHENSIVE METABOLIC PANEL
ALT: 10 U/L (ref 0–53)
AST: 17 U/L (ref 0–37)
Albumin: 4.2 g/dL (ref 3.5–5.2)
Alkaline Phosphatase: 54 U/L (ref 39–117)
BUN: 16 mg/dL (ref 6–23)
CALCIUM: 9.1 mg/dL (ref 8.4–10.5)
CHLORIDE: 106 meq/L (ref 96–112)
CO2: 24 mEq/L (ref 19–32)
Creat: 1.21 mg/dL (ref 0.50–1.35)
GLUCOSE: 96 mg/dL (ref 70–99)
POTASSIUM: 3.9 meq/L (ref 3.5–5.3)
Sodium: 138 mEq/L (ref 135–145)
Total Bilirubin: 1 mg/dL (ref 0.2–1.2)
Total Protein: 6.9 g/dL (ref 6.0–8.3)

## 2013-11-27 LAB — CBC
HCT: 40.5 % (ref 39.0–52.0)
HEMOGLOBIN: 13.9 g/dL (ref 13.0–17.0)
MCH: 30.1 pg (ref 26.0–34.0)
MCHC: 34.3 g/dL (ref 30.0–36.0)
MCV: 87.7 fL (ref 78.0–100.0)
Platelets: 212 10*3/uL (ref 150–400)
RBC: 4.62 MIL/uL (ref 4.22–5.81)
RDW: 15.9 % — ABNORMAL HIGH (ref 11.5–15.5)
WBC: 8.1 10*3/uL (ref 4.0–10.5)

## 2013-11-27 LAB — TSH: TSH: 2.618 u[IU]/mL (ref 0.350–4.500)

## 2013-11-27 NOTE — Patient Instructions (Signed)
I will call with lab results. Get to physical therapy. Start using all those walkers you have.   Make a decision about whether you are really going to push yourself or just give in.

## 2013-11-28 LAB — VITAMIN B12: Vitamin B-12: 694 pg/mL (ref 211–911)

## 2013-11-28 LAB — VITAMIN D 25 HYDROXY (VIT D DEFICIENCY, FRACTURES): VIT D 25 HYDROXY: 40 ng/mL (ref 30–89)

## 2013-11-28 NOTE — Assessment & Plan Note (Signed)
Recheck is normal

## 2013-11-28 NOTE — Progress Notes (Signed)
   Subjective:    Patient ID: Juan Li, male    DOB: January 17, 1932, 80 y.o.   MRN: [id number]  HPI Generalized weakness and imbalance.  Wife concerned about reversible conditions, specifically B12 deficiency.  Weakness is longstanding.  He has not fallen recently but very concerned with fall.  Uses cane.  Longstanding spinal cord related lower ext and gait problems.    Review of Systems     Objective:   Physical Exam Note BP had late entry.  I took in exam room and entered later when charting. Unsteady when standing with eyes closed.  Neg pronator drift. Gait is unsteady.  Quad weakness with great difficulty crawling onto exam table.  Slow to turn when walking.  Uses arms when rising from chair.        Assessment & Plan:

## 2013-11-28 NOTE — Assessment & Plan Note (Signed)
PT and switch from cane to walker.

## 2013-11-28 NOTE — Assessment & Plan Note (Signed)
Weakness and gait abnormality is multi factorial with arthritis contributing.

## 2013-11-28 NOTE — Assessment & Plan Note (Signed)
Will do labs for reversible causes of weakness.  PT and switch from cane to walker.

## 2013-12-04 ENCOUNTER — Ambulatory Visit: Payer: Medicare Other | Attending: Family Medicine | Admitting: Rehabilitative and Restorative Service Providers"

## 2013-12-04 DIAGNOSIS — I10 Essential (primary) hypertension: Secondary | ICD-10-CM | POA: Diagnosis not present

## 2013-12-04 DIAGNOSIS — R269 Unspecified abnormalities of gait and mobility: Secondary | ICD-10-CM | POA: Diagnosis not present

## 2013-12-04 DIAGNOSIS — IMO0001 Reserved for inherently not codable concepts without codable children: Secondary | ICD-10-CM | POA: Diagnosis not present

## 2013-12-04 DIAGNOSIS — M109 Gout, unspecified: Secondary | ICD-10-CM | POA: Insufficient documentation

## 2013-12-04 DIAGNOSIS — Z951 Presence of aortocoronary bypass graft: Secondary | ICD-10-CM | POA: Diagnosis not present

## 2013-12-04 DIAGNOSIS — F0390 Unspecified dementia without behavioral disturbance: Secondary | ICD-10-CM | POA: Diagnosis not present

## 2013-12-06 ENCOUNTER — Other Ambulatory Visit: Payer: Self-pay | Admitting: Family Medicine

## 2013-12-09 ENCOUNTER — Ambulatory Visit (INDEPENDENT_AMBULATORY_CARE_PROVIDER_SITE_OTHER): Payer: Medicare Other | Admitting: *Deleted

## 2013-12-09 DIAGNOSIS — I359 Nonrheumatic aortic valve disorder, unspecified: Secondary | ICD-10-CM

## 2013-12-09 DIAGNOSIS — Z954 Presence of other heart-valve replacement: Secondary | ICD-10-CM

## 2013-12-09 DIAGNOSIS — Z7901 Long term (current) use of anticoagulants: Secondary | ICD-10-CM

## 2013-12-09 DIAGNOSIS — I350 Nonrheumatic aortic (valve) stenosis: Secondary | ICD-10-CM

## 2013-12-09 DIAGNOSIS — Z952 Presence of prosthetic heart valve: Secondary | ICD-10-CM

## 2013-12-09 LAB — POCT INR: INR: 2.7

## 2013-12-10 ENCOUNTER — Ambulatory Visit: Payer: Medicare Other | Admitting: Rehabilitative and Restorative Service Providers"

## 2013-12-10 DIAGNOSIS — I10 Essential (primary) hypertension: Secondary | ICD-10-CM | POA: Diagnosis not present

## 2013-12-10 DIAGNOSIS — Z951 Presence of aortocoronary bypass graft: Secondary | ICD-10-CM | POA: Diagnosis not present

## 2013-12-10 DIAGNOSIS — F0390 Unspecified dementia without behavioral disturbance: Secondary | ICD-10-CM | POA: Diagnosis not present

## 2013-12-10 DIAGNOSIS — R269 Unspecified abnormalities of gait and mobility: Secondary | ICD-10-CM | POA: Diagnosis not present

## 2013-12-10 DIAGNOSIS — IMO0001 Reserved for inherently not codable concepts without codable children: Secondary | ICD-10-CM | POA: Diagnosis not present

## 2013-12-10 DIAGNOSIS — M109 Gout, unspecified: Secondary | ICD-10-CM | POA: Diagnosis not present

## 2013-12-12 ENCOUNTER — Ambulatory Visit: Payer: Medicare Other | Admitting: Physical Therapy

## 2013-12-12 DIAGNOSIS — I10 Essential (primary) hypertension: Secondary | ICD-10-CM | POA: Diagnosis not present

## 2013-12-12 DIAGNOSIS — F0390 Unspecified dementia without behavioral disturbance: Secondary | ICD-10-CM | POA: Diagnosis not present

## 2013-12-12 DIAGNOSIS — IMO0001 Reserved for inherently not codable concepts without codable children: Secondary | ICD-10-CM | POA: Diagnosis not present

## 2013-12-12 DIAGNOSIS — M109 Gout, unspecified: Secondary | ICD-10-CM | POA: Diagnosis not present

## 2013-12-12 DIAGNOSIS — R269 Unspecified abnormalities of gait and mobility: Secondary | ICD-10-CM | POA: Diagnosis not present

## 2013-12-12 DIAGNOSIS — Z951 Presence of aortocoronary bypass graft: Secondary | ICD-10-CM | POA: Diagnosis not present

## 2013-12-17 ENCOUNTER — Ambulatory Visit: Payer: Medicare Other | Admitting: Physical Therapy

## 2013-12-17 DIAGNOSIS — M109 Gout, unspecified: Secondary | ICD-10-CM | POA: Diagnosis not present

## 2013-12-17 DIAGNOSIS — R269 Unspecified abnormalities of gait and mobility: Secondary | ICD-10-CM | POA: Diagnosis not present

## 2013-12-17 DIAGNOSIS — Z951 Presence of aortocoronary bypass graft: Secondary | ICD-10-CM | POA: Diagnosis not present

## 2013-12-17 DIAGNOSIS — IMO0001 Reserved for inherently not codable concepts without codable children: Secondary | ICD-10-CM | POA: Diagnosis not present

## 2013-12-17 DIAGNOSIS — F0390 Unspecified dementia without behavioral disturbance: Secondary | ICD-10-CM | POA: Diagnosis not present

## 2013-12-17 DIAGNOSIS — I10 Essential (primary) hypertension: Secondary | ICD-10-CM | POA: Diagnosis not present

## 2013-12-19 ENCOUNTER — Ambulatory Visit: Payer: Medicare Other | Admitting: Physical Therapy

## 2013-12-19 DIAGNOSIS — F0390 Unspecified dementia without behavioral disturbance: Secondary | ICD-10-CM | POA: Diagnosis not present

## 2013-12-19 DIAGNOSIS — Z951 Presence of aortocoronary bypass graft: Secondary | ICD-10-CM | POA: Diagnosis not present

## 2013-12-19 DIAGNOSIS — M109 Gout, unspecified: Secondary | ICD-10-CM | POA: Diagnosis not present

## 2013-12-19 DIAGNOSIS — I10 Essential (primary) hypertension: Secondary | ICD-10-CM | POA: Diagnosis not present

## 2013-12-19 DIAGNOSIS — IMO0001 Reserved for inherently not codable concepts without codable children: Secondary | ICD-10-CM | POA: Diagnosis not present

## 2013-12-19 DIAGNOSIS — R269 Unspecified abnormalities of gait and mobility: Secondary | ICD-10-CM | POA: Diagnosis not present

## 2013-12-24 ENCOUNTER — Ambulatory Visit: Payer: Medicare Other | Admitting: Rehabilitative and Restorative Service Providers"

## 2013-12-24 DIAGNOSIS — Z951 Presence of aortocoronary bypass graft: Secondary | ICD-10-CM | POA: Diagnosis not present

## 2013-12-24 DIAGNOSIS — F0390 Unspecified dementia without behavioral disturbance: Secondary | ICD-10-CM | POA: Diagnosis not present

## 2013-12-24 DIAGNOSIS — R269 Unspecified abnormalities of gait and mobility: Secondary | ICD-10-CM | POA: Diagnosis not present

## 2013-12-24 DIAGNOSIS — IMO0001 Reserved for inherently not codable concepts without codable children: Secondary | ICD-10-CM | POA: Diagnosis not present

## 2013-12-24 DIAGNOSIS — M109 Gout, unspecified: Secondary | ICD-10-CM | POA: Diagnosis not present

## 2013-12-24 DIAGNOSIS — I10 Essential (primary) hypertension: Secondary | ICD-10-CM | POA: Diagnosis not present

## 2013-12-26 ENCOUNTER — Ambulatory Visit: Payer: Medicare Other | Admitting: Physical Therapy

## 2013-12-26 DIAGNOSIS — F0390 Unspecified dementia without behavioral disturbance: Secondary | ICD-10-CM | POA: Diagnosis not present

## 2013-12-26 DIAGNOSIS — I10 Essential (primary) hypertension: Secondary | ICD-10-CM | POA: Diagnosis not present

## 2013-12-26 DIAGNOSIS — R269 Unspecified abnormalities of gait and mobility: Secondary | ICD-10-CM | POA: Diagnosis not present

## 2013-12-26 DIAGNOSIS — Z951 Presence of aortocoronary bypass graft: Secondary | ICD-10-CM | POA: Diagnosis not present

## 2013-12-26 DIAGNOSIS — IMO0001 Reserved for inherently not codable concepts without codable children: Secondary | ICD-10-CM | POA: Diagnosis not present

## 2013-12-26 DIAGNOSIS — M109 Gout, unspecified: Secondary | ICD-10-CM | POA: Diagnosis not present

## 2013-12-31 ENCOUNTER — Ambulatory Visit: Payer: Medicare Other | Attending: Family Medicine | Admitting: Rehabilitative and Restorative Service Providers"

## 2013-12-31 DIAGNOSIS — R269 Unspecified abnormalities of gait and mobility: Secondary | ICD-10-CM | POA: Diagnosis not present

## 2013-12-31 DIAGNOSIS — Z951 Presence of aortocoronary bypass graft: Secondary | ICD-10-CM | POA: Insufficient documentation

## 2013-12-31 DIAGNOSIS — IMO0001 Reserved for inherently not codable concepts without codable children: Secondary | ICD-10-CM | POA: Insufficient documentation

## 2013-12-31 DIAGNOSIS — F0390 Unspecified dementia without behavioral disturbance: Secondary | ICD-10-CM | POA: Insufficient documentation

## 2013-12-31 DIAGNOSIS — I10 Essential (primary) hypertension: Secondary | ICD-10-CM | POA: Diagnosis not present

## 2013-12-31 DIAGNOSIS — M109 Gout, unspecified: Secondary | ICD-10-CM | POA: Insufficient documentation

## 2014-01-02 ENCOUNTER — Ambulatory Visit: Payer: Medicare Other | Admitting: Rehabilitative and Restorative Service Providers"

## 2014-01-02 DIAGNOSIS — Z951 Presence of aortocoronary bypass graft: Secondary | ICD-10-CM | POA: Diagnosis not present

## 2014-01-02 DIAGNOSIS — M109 Gout, unspecified: Secondary | ICD-10-CM | POA: Diagnosis not present

## 2014-01-02 DIAGNOSIS — F0390 Unspecified dementia without behavioral disturbance: Secondary | ICD-10-CM | POA: Diagnosis not present

## 2014-01-02 DIAGNOSIS — I10 Essential (primary) hypertension: Secondary | ICD-10-CM | POA: Diagnosis not present

## 2014-01-02 DIAGNOSIS — R269 Unspecified abnormalities of gait and mobility: Secondary | ICD-10-CM | POA: Diagnosis not present

## 2014-01-02 DIAGNOSIS — IMO0001 Reserved for inherently not codable concepts without codable children: Secondary | ICD-10-CM | POA: Diagnosis not present

## 2014-01-03 ENCOUNTER — Other Ambulatory Visit: Payer: Medicare Other

## 2014-01-03 ENCOUNTER — Ambulatory Visit (INDEPENDENT_AMBULATORY_CARE_PROVIDER_SITE_OTHER): Payer: Medicare Other

## 2014-01-03 ENCOUNTER — Telehealth: Payer: Self-pay | Admitting: Family Medicine

## 2014-01-03 DIAGNOSIS — I359 Nonrheumatic aortic valve disorder, unspecified: Secondary | ICD-10-CM | POA: Diagnosis not present

## 2014-01-03 DIAGNOSIS — I350 Nonrheumatic aortic (valve) stenosis: Secondary | ICD-10-CM

## 2014-01-03 DIAGNOSIS — Z954 Presence of other heart-valve replacement: Secondary | ICD-10-CM

## 2014-01-03 DIAGNOSIS — M159 Polyosteoarthritis, unspecified: Secondary | ICD-10-CM

## 2014-01-03 DIAGNOSIS — Z952 Presence of prosthetic heart valve: Secondary | ICD-10-CM

## 2014-01-03 DIAGNOSIS — Z7901 Long term (current) use of anticoagulants: Secondary | ICD-10-CM | POA: Diagnosis not present

## 2014-01-03 LAB — POCT INR: INR: 3.6

## 2014-01-03 MED ORDER — HYDROCODONE-ACETAMINOPHEN 10-325 MG PO TABS: 1.0000 | ORAL_TABLET | Freq: Four times a day (QID) | ORAL | Status: DC | PRN

## 2014-01-03 NOTE — Telephone Encounter (Signed)
Pt called and needs a refill on his hydrocodone left up front for pick up. Please call when ready. jw

## 2014-01-03 NOTE — Telephone Encounter (Signed)
LVM for patient to call back. I have set rx up front for pick up

## 2014-01-05 ENCOUNTER — Other Ambulatory Visit: Payer: Self-pay | Admitting: Cardiology

## 2014-01-07 ENCOUNTER — Other Ambulatory Visit: Payer: Self-pay

## 2014-01-07 ENCOUNTER — Ambulatory Visit: Payer: Medicare Other | Admitting: Physical Therapy

## 2014-01-07 DIAGNOSIS — F0390 Unspecified dementia without behavioral disturbance: Secondary | ICD-10-CM | POA: Diagnosis not present

## 2014-01-07 DIAGNOSIS — M109 Gout, unspecified: Secondary | ICD-10-CM | POA: Diagnosis not present

## 2014-01-07 DIAGNOSIS — R269 Unspecified abnormalities of gait and mobility: Secondary | ICD-10-CM | POA: Diagnosis not present

## 2014-01-07 DIAGNOSIS — IMO0001 Reserved for inherently not codable concepts without codable children: Secondary | ICD-10-CM | POA: Diagnosis not present

## 2014-01-07 DIAGNOSIS — Z951 Presence of aortocoronary bypass graft: Secondary | ICD-10-CM | POA: Diagnosis not present

## 2014-01-07 DIAGNOSIS — I10 Essential (primary) hypertension: Secondary | ICD-10-CM | POA: Diagnosis not present

## 2014-01-09 ENCOUNTER — Other Ambulatory Visit: Payer: Self-pay | Admitting: Family Medicine

## 2014-01-10 ENCOUNTER — Ambulatory Visit: Payer: Medicare Other | Admitting: Rehabilitative and Restorative Service Providers"

## 2014-01-10 NOTE — Telephone Encounter (Signed)
Patient has picked up rx.

## 2014-01-21 ENCOUNTER — Ambulatory Visit: Payer: Medicare Other | Admitting: Rehabilitative and Restorative Service Providers"

## 2014-01-21 DIAGNOSIS — M109 Gout, unspecified: Secondary | ICD-10-CM | POA: Diagnosis not present

## 2014-01-21 DIAGNOSIS — F0390 Unspecified dementia without behavioral disturbance: Secondary | ICD-10-CM | POA: Diagnosis not present

## 2014-01-21 DIAGNOSIS — IMO0001 Reserved for inherently not codable concepts without codable children: Secondary | ICD-10-CM | POA: Diagnosis not present

## 2014-01-21 DIAGNOSIS — I10 Essential (primary) hypertension: Secondary | ICD-10-CM | POA: Diagnosis not present

## 2014-01-21 DIAGNOSIS — Z951 Presence of aortocoronary bypass graft: Secondary | ICD-10-CM | POA: Diagnosis not present

## 2014-01-21 DIAGNOSIS — R269 Unspecified abnormalities of gait and mobility: Secondary | ICD-10-CM | POA: Diagnosis not present

## 2014-01-23 ENCOUNTER — Ambulatory Visit: Payer: Medicare Other | Admitting: Rehabilitative and Restorative Service Providers"

## 2014-01-28 ENCOUNTER — Ambulatory Visit: Payer: Medicare Other | Admitting: Rehabilitative and Restorative Service Providers"

## 2014-01-31 ENCOUNTER — Ambulatory Visit (INDEPENDENT_AMBULATORY_CARE_PROVIDER_SITE_OTHER): Payer: Medicare Other

## 2014-01-31 DIAGNOSIS — I359 Nonrheumatic aortic valve disorder, unspecified: Secondary | ICD-10-CM | POA: Diagnosis not present

## 2014-01-31 DIAGNOSIS — Z954 Presence of other heart-valve replacement: Secondary | ICD-10-CM | POA: Diagnosis not present

## 2014-01-31 DIAGNOSIS — I350 Nonrheumatic aortic (valve) stenosis: Secondary | ICD-10-CM | POA: Diagnosis not present

## 2014-01-31 DIAGNOSIS — Z952 Presence of prosthetic heart valve: Secondary | ICD-10-CM

## 2014-01-31 LAB — POCT INR: INR: 2.2

## 2014-02-03 ENCOUNTER — Ambulatory Visit (INDEPENDENT_AMBULATORY_CARE_PROVIDER_SITE_OTHER): Payer: Medicare Other | Admitting: Cardiology

## 2014-02-03 VITALS — BP 140/80 | HR 45 | Ht 66.0 in | Wt 196.0 lb

## 2014-02-03 DIAGNOSIS — E7800 Pure hypercholesterolemia, unspecified: Secondary | ICD-10-CM

## 2014-02-03 DIAGNOSIS — I359 Nonrheumatic aortic valve disorder, unspecified: Secondary | ICD-10-CM

## 2014-02-03 DIAGNOSIS — I5032 Chronic diastolic (congestive) heart failure: Secondary | ICD-10-CM

## 2014-02-03 DIAGNOSIS — I10 Essential (primary) hypertension: Secondary | ICD-10-CM

## 2014-02-03 DIAGNOSIS — R001 Bradycardia, unspecified: Secondary | ICD-10-CM | POA: Diagnosis not present

## 2014-02-03 NOTE — Patient Instructions (Signed)
Your physician recommends that you have  lab work today--BMET/Lipid profile  Your physician has requested that you regularly monitor and record your blood pressure readings at home. Check your blood pressure daily and when you are lightheaded. Please use the same machine at the same time of day to check your readings and record them. I will call you in about a month and get the readings from you. Eliot Ford  Your physician wants you to follow-up in: 4 months with Dr Aundra Dubin. (February 2016). You will receive a reminder letter in the mail two months in advance. If you don't receive a letter, please call our office to schedule the follow-up appointment.

## 2014-02-04 ENCOUNTER — Encounter: Payer: Self-pay | Admitting: Cardiology

## 2014-02-04 LAB — BASIC METABOLIC PANEL
BUN: 20 mg/dL (ref 6–23)
CHLORIDE: 106 meq/L (ref 96–112)
CO2: 27 mEq/L (ref 19–32)
Calcium: 8.9 mg/dL (ref 8.4–10.5)
Creatinine, Ser: 1.3 mg/dL (ref 0.4–1.5)
GFR: 58.5 mL/min — AB (ref >60.00)
Glucose, Bld: 87 mg/dL (ref 70–99)
POTASSIUM: 4.8 meq/L (ref 3.5–5.1)
SODIUM: 137 meq/L (ref 135–145)

## 2014-02-04 LAB — LIPID PANEL
CHOL/HDL RATIO: 7
Cholesterol: 199 mg/dL (ref 0–200)
HDL: 30.6 mg/dL — ABNORMAL LOW (ref >39.00)
NonHDL: 168.4
Triglycerides: 230 mg/dL — ABNORMAL HIGH (ref 0.0–149.0)
VLDL: 46 mg/dL — AB (ref 0.0–40.0)

## 2014-02-04 LAB — LDL CHOLESTEROL, DIRECT: LDL DIRECT: 134 mg/dL

## 2014-02-04 NOTE — Progress Notes (Signed)
Patient ID: Juan Li, male   DOB: [birth year], 80 y.o.   MRN: [id number] PCP: Dr. Andria Frames  80 yo with history of AS s/p St Jude AVR and chronic diastolic CHF presents for cardiology followup.  His valve was replaced in 1993.  Echo in 10/13 showed normal EF and a well-seated mechanical valve.  Cardiac cath in 1993 showed nonobstructive disease.  He has been started on Namenda and Aricept for some degree of dementia.  He had a holter monitor in 6/15 due to bradycardia, this actually showed a 17 beat run of NSVT and short runs of atrial tachycardia.  Otherwise, average HR was 61 and it was decided that he was too bradycardic to increase beta blocker.  Lexiscan Cardiolite was done in 6/15 due to NSVT and showed no ischemia.   At last appointment, he appeared volume overloaded.  I increased his Lasix to 40 mg daily and weight is down 6 lbs.  He feels "pretty good."  He can walk to the mailbox and back without dyspnea.  No orthopnea.  He can climb the steps in his house ok.  Main complaint is occasional dizzy spells that will last for up to an hour.  They are not positional (can be sitting or standing).  No syncope.  No chest pain or tachypalpitations.   Labs (1/12): K 4.8, creatinine 1.3 Labs (12/12): creatinine 1.2, LDL 177 Labs (10/13): creatinine 1.6 => 1.1, LDL 135 Labs (12/13): LDL 74, HDL 30 Labs (4/14): K 4.6 => 4.8, creatinine 1.1 => 1.3, TSH normal, HCT 37.6, BNP 180 Labs (6/14): K 4.3, creatinine 1.3 Labs (8/14): K 4.8, creatinine 1.5, BNP 100 Labs (10/14): K 4.1, creatinine 1.3, BNP 184, LDL 100, HDL 35 Labs (1/15): K 4, creatinine 1.4, BNP 204 Labs (3/15): K 4.6, creatinine 1.4, BNP 110 Labs (7/15): K 3.9, creatinine 1.2  PMH: 1. Dementia: On Aricept and Namenda.   2. Hyperlipidemia: myalgias with Vytorin, tolerating pravastatin 3. BPH 4. Osteoarthritis right knee, s/p TKR 10/13.  5. Cervical myelopathy  6. Obesity 7. HTN 8. Aortic stenosis: St. Jude mechanical aortic valve  placed in 1993. Echo (10/13) with EF 65-70% and normal function of the aortic valve (mean gradient 12 mmHg).   9. Cardiac cath in 1993 with nonobstructive disease.  Lexiscan Cardiolite (6/15) with EF 56%, inferior thinning, no ischemia.  10. L-spine disease/spinal stenosis.  11. Abdominal ultrasound in 12/11 did not show AAA 12. Gout 13. Shoulder surgery 1/12 14. CKD 15. Chronic diastolic CHF: Echo (40/34) with EF 65-70%, normally-functioning mechanical aortic valve (mean gradient 12).   16. Sinus bradycardia: Holter (6/15) showed 17 beats NSVT, several short runs atrial tachycardia, HR 46-95 mean 61.    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  Married.  Does not smoke.   FH: No premature CAD.   ROS: All systems reviewed and negative except as per HPI.    Current Outpatient Prescriptions  Medication Sig Dispense Refill  . aspirin EC 81 MG tablet Take 81 mg by mouth every morning.       . AVODART 0.5 MG capsule TAKE 1 CAPSULE (0.5 MG TOTAL) BY MOUTH EVERY EVENING.  90 capsule  3  . Colchicine 0.6 MG CAPS Take one cap TID for 2 days, then one daily until redness resolves.  20 capsule  2  . donepezil (ARICEPT) 5 MG tablet ONE BY MOUTH DAILY  90 tablet  3  . enalapril (VASOTEC) 10 MG tablet TAKE 1 TABLET (10 MG TOTAL) BY MOUTH  DAILY.  60 tablet  5  . famotidine (PEPCID) 20 MG tablet Take 1 tablet (20 mg total) by mouth 2 (two) times daily.  30 tablet  12  . furosemide (LASIX) 40 MG tablet Take 1 tablet (40 mg total) by mouth daily.  30 tablet  11  . HYDROcodone-acetaminophen (NORCO) 10-325 MG per tablet Take 1 tablet by mouth every 6 (six) hours as needed.  120 tablet  0  . KLOR-CON M10 10 MEQ tablet       . metoprolol tartrate (LOPRESSOR) 25 MG tablet TAKE 1 TABLET (25 MG TOTAL) BY MOUTH 2 (TWO) TIMES DAILY.  180 tablet  3  . NAMENDA 10 MG tablet TAKE 1 TABLET TWICE A DAY  180 tablet  3  . naproxen (NAPROSYN) 500 MG tablet Take 1 tablet (500 mg total) by mouth 2 (two) times daily  with a meal.  30 tablet  0  . potassium chloride (K-DUR) 10 MEQ tablet Take 1 tablet (10 mEq total) by mouth daily.  30 tablet  11  . pravastatin (PRAVACHOL) 40 MG tablet Take 2 tablets (80 mg total) by mouth daily.  60 tablet  2  . tamsulosin (FLOMAX) 0.4 MG CAPS capsule Take 1 capsule (0.4 mg total) by mouth daily.  90 capsule  3  . Vitamin D, Ergocalciferol, (DRISDOL) 50000 UNITS CAPS capsule TAKE 1 CAPSULE (50,000 UNITS TOTAL) BY MOUTH DAILY. FOR ONE WEEK THEN WEEKLY THEREAFTER  30 capsule  0  . warfarin (COUMADIN) 5 MG tablet TAKE AS DIRECTED  100 tablet  PRN   No current facility-administered medications for this visit.    BP 140/80  Pulse 45  Ht 5\' 6"  (1.676 m)  Wt 196 lb (88.905 kg)  BMI 31.65 kg/m2 General: NAD, overweight Neck: Thick, no JVD, no thyromegaly or thyroid nodule.  Lungs: Clear to auscultation bilaterally with normal respiratory effort. CV: Nondisplaced PMI.  Heart regular S1/S2, mechanical S2, no S3/S4, 1/6 SEM RUSB.  No edema.  No carotid bruit.  Normal pedal pulses.  Abdomen: Soft, nontender, no hepatosplenomegaly, no distention.  Neurologic: Alert and oriented x 3.  Psych: Normal affect. Extremities: No clubbing or cyanosis.   Assessment/Plan:  Aortic valve disorders  Mechanical aortic valve.  No bleeding problems on coumadin. Continue ASA 81 also.   Valve appeared normal on 10/13 echo.  Crisp S2 on exam today.  HYPERCHOLESTEROLEMIA  Tolerating pravastatin.  Check lipids today. HYPERTENSION BP ok today.  He has "dizzy spells" at home that are not positional.  I am going to have him get a BP cuff and check BP daily as well as when he has "dizzy spells."  We will call him in 1 month for readings.   Chronic diastolic CHF Patient does not appear volume overloaded on exam.  Continue current Lasix.  BMET today.  Bradycardia Patient is bradycardic with HR in upper 40s currently but BP is fine.  He is not dizzy currently.  He had 17 beats NSVT as well as atrial  tachycardia on holter but given bradycardia, I am not going to increase metoprolol.  I will keep him on the same dose.     [name] 02/04/2014

## 2014-02-06 ENCOUNTER — Telehealth: Payer: Self-pay | Admitting: Cardiology

## 2014-02-06 NOTE — Telephone Encounter (Signed)
Pt states he just got new BP cuff and is going to begin to check BP today

## 2014-02-06 NOTE — Telephone Encounter (Signed)
LMTCB

## 2014-02-06 NOTE — Telephone Encounter (Signed)
NEW PROBLEM:   Pt called back with his BP reading.   Please give him a call back.

## 2014-02-07 ENCOUNTER — Telehealth: Payer: Self-pay | Admitting: Family Medicine

## 2014-02-07 ENCOUNTER — Telehealth: Payer: Self-pay | Admitting: Cardiology

## 2014-02-07 DIAGNOSIS — M159 Polyosteoarthritis, unspecified: Secondary | ICD-10-CM

## 2014-02-07 NOTE — Telephone Encounter (Signed)
New message      Returning a call to triage

## 2014-02-07 NOTE — Telephone Encounter (Signed)
Patient's wife states she has only been giving him pravastatin 40mg  2 or 3 times a week. She states it makes him "mean". Advised  Her and him that he needs to be taking every evening. She said she will give it to him.

## 2014-02-07 NOTE — Telephone Encounter (Signed)
Pt calling for his refill on hydrocodone.

## 2014-02-10 MED ORDER — HYDROCODONE-ACETAMINOPHEN 10-325 MG PO TABS: 1.0000 | ORAL_TABLET | Freq: Four times a day (QID) | ORAL | Status: DC | PRN

## 2014-02-10 NOTE — Telephone Encounter (Signed)
Rx written and patient notified that it is up front waiting for pick up.

## 2014-02-12 ENCOUNTER — Other Ambulatory Visit: Payer: Self-pay | Admitting: *Deleted

## 2014-02-21 ENCOUNTER — Ambulatory Visit (INDEPENDENT_AMBULATORY_CARE_PROVIDER_SITE_OTHER): Payer: Medicare Other | Admitting: Pharmacist

## 2014-02-21 ENCOUNTER — Telehealth: Payer: Self-pay

## 2014-02-21 DIAGNOSIS — I359 Nonrheumatic aortic valve disorder, unspecified: Secondary | ICD-10-CM

## 2014-02-21 DIAGNOSIS — I350 Nonrheumatic aortic (valve) stenosis: Secondary | ICD-10-CM

## 2014-02-21 DIAGNOSIS — Z952 Presence of prosthetic heart valve: Secondary | ICD-10-CM

## 2014-02-21 DIAGNOSIS — Z954 Presence of other heart-valve replacement: Secondary | ICD-10-CM

## 2014-02-21 LAB — POCT INR: INR: 6.2

## 2014-02-21 LAB — PROTIME-INR
INR: 5.6 ratio — AB (ref 0.8–1.0)
Prothrombin Time: 59.7 s (ref 9.6–13.1)

## 2014-02-21 NOTE — Telephone Encounter (Signed)
Juan Li from lab called with critical INR. INR=5.6 Protime= 59.7

## 2014-02-21 NOTE — Telephone Encounter (Signed)
INR addressed by Coumadin clinic.  See anti-coag visit for details

## 2014-02-25 DIAGNOSIS — Z23 Encounter for immunization: Secondary | ICD-10-CM | POA: Diagnosis not present

## 2014-02-28 ENCOUNTER — Ambulatory Visit (INDEPENDENT_AMBULATORY_CARE_PROVIDER_SITE_OTHER): Payer: Medicare Other | Admitting: *Deleted

## 2014-02-28 DIAGNOSIS — I350 Nonrheumatic aortic (valve) stenosis: Secondary | ICD-10-CM

## 2014-02-28 DIAGNOSIS — Z952 Presence of prosthetic heart valve: Secondary | ICD-10-CM

## 2014-02-28 DIAGNOSIS — I359 Nonrheumatic aortic valve disorder, unspecified: Secondary | ICD-10-CM | POA: Diagnosis not present

## 2014-02-28 DIAGNOSIS — Z954 Presence of other heart-valve replacement: Secondary | ICD-10-CM

## 2014-02-28 LAB — POCT INR: INR: 2.1

## 2014-03-05 ENCOUNTER — Telehealth: Payer: Self-pay | Admitting: *Deleted

## 2014-03-05 NOTE — Telephone Encounter (Signed)
Copied from Dr Claris Gladden 02/03/14 office note:  HYPERTENSION BP ok today. He has "dizzy spells" at home that are not positional. I am going to have him get a BP cuff and check BP daily as well as when he has "dizzy spells." We will call him in 1 month for readings.   03/05/14--LMTCB

## 2014-03-07 ENCOUNTER — Telehealth: Payer: Self-pay | Admitting: Cardiology

## 2014-03-07 ENCOUNTER — Ambulatory Visit (INDEPENDENT_AMBULATORY_CARE_PROVIDER_SITE_OTHER): Payer: Medicare Other | Admitting: *Deleted

## 2014-03-07 DIAGNOSIS — I350 Nonrheumatic aortic (valve) stenosis: Secondary | ICD-10-CM

## 2014-03-07 DIAGNOSIS — Z954 Presence of other heart-valve replacement: Secondary | ICD-10-CM

## 2014-03-07 DIAGNOSIS — Z952 Presence of prosthetic heart valve: Secondary | ICD-10-CM

## 2014-03-07 DIAGNOSIS — I359 Nonrheumatic aortic valve disorder, unspecified: Secondary | ICD-10-CM | POA: Diagnosis not present

## 2014-03-07 LAB — POCT INR: INR: 3.7

## 2014-03-07 NOTE — Telephone Encounter (Signed)
Pt states he bought a BP cuff that did not work.  He tried to return it but was given a hard time and finally got his money back. He did not get another BP cuff.  Pt states he went to store to check his BP but their BP machine was not working.  Pt has not been able to check his BP since visit with Dr Aundra Dubin 02/03/14, he states his dizziness is about the same. Pt will try to go to store with BP machine and check his BP regularly.  Pt to call me with BP readings if he is able to do that.

## 2014-03-07 NOTE — Telephone Encounter (Signed)
New message      Talk to Juan Li regarding his bp readings

## 2014-03-07 NOTE — Telephone Encounter (Signed)
LMTCB

## 2014-03-10 NOTE — Telephone Encounter (Signed)
Pt advised.

## 2014-03-10 NOTE — Telephone Encounter (Signed)
Pt states BP Friday at [facility] was 142/62, no dizziness.

## 2014-03-10 NOTE — Telephone Encounter (Signed)
No changes

## 2014-03-17 ENCOUNTER — Ambulatory Visit (INDEPENDENT_AMBULATORY_CARE_PROVIDER_SITE_OTHER): Payer: Medicare Other | Admitting: *Deleted

## 2014-03-17 ENCOUNTER — Other Ambulatory Visit: Payer: Self-pay | Admitting: Family Medicine

## 2014-03-17 DIAGNOSIS — Z954 Presence of other heart-valve replacement: Secondary | ICD-10-CM | POA: Diagnosis not present

## 2014-03-17 DIAGNOSIS — I350 Nonrheumatic aortic (valve) stenosis: Secondary | ICD-10-CM | POA: Diagnosis not present

## 2014-03-17 DIAGNOSIS — I359 Nonrheumatic aortic valve disorder, unspecified: Secondary | ICD-10-CM | POA: Diagnosis not present

## 2014-03-17 DIAGNOSIS — Z952 Presence of prosthetic heart valve: Secondary | ICD-10-CM

## 2014-03-17 LAB — POCT INR: INR: 2.9

## 2014-04-03 ENCOUNTER — Ambulatory Visit (INDEPENDENT_AMBULATORY_CARE_PROVIDER_SITE_OTHER): Payer: Medicare Other | Admitting: Pharmacist

## 2014-04-03 DIAGNOSIS — Z954 Presence of other heart-valve replacement: Secondary | ICD-10-CM

## 2014-04-03 DIAGNOSIS — I350 Nonrheumatic aortic (valve) stenosis: Secondary | ICD-10-CM | POA: Diagnosis not present

## 2014-04-03 DIAGNOSIS — Z952 Presence of prosthetic heart valve: Secondary | ICD-10-CM

## 2014-04-03 DIAGNOSIS — I359 Nonrheumatic aortic valve disorder, unspecified: Secondary | ICD-10-CM | POA: Diagnosis not present

## 2014-04-03 LAB — POCT INR: INR: 3.2

## 2014-04-04 ENCOUNTER — Other Ambulatory Visit: Payer: Self-pay | Admitting: Family Medicine

## 2014-04-04 ENCOUNTER — Other Ambulatory Visit: Payer: Self-pay | Admitting: Cardiology

## 2014-04-04 DIAGNOSIS — M159 Polyosteoarthritis, unspecified: Secondary | ICD-10-CM

## 2014-04-04 NOTE — Telephone Encounter (Signed)
Pt called and needs a refill on his hydrocodone left up front. Please call when ready. jw

## 2014-04-05 NOTE — Telephone Encounter (Signed)
Rx was sent to pharmacy electronically. 

## 2014-04-07 ENCOUNTER — Other Ambulatory Visit: Payer: Self-pay

## 2014-04-07 MED ORDER — ENALAPRIL MALEATE 10 MG PO TABS: ORAL_TABLET | ORAL | Status: DC

## 2014-04-07 NOTE — Telephone Encounter (Signed)
Will ask patient to come in for a dedicated visit for chronic narcotic requiring pain management as per clinic policy.  Will need signed pain contract.

## 2014-04-07 NOTE — Telephone Encounter (Signed)
Pt is calling to check the status of his refill request on his Hydrocodone. jw

## 2014-04-08 NOTE — Telephone Encounter (Signed)
Spoke with patient's wife and appointment has been made for tomorrow 12/9 @ 4:00pm with Dr. Andria Frames

## 2014-04-09 ENCOUNTER — Encounter: Payer: Self-pay | Admitting: Family Medicine

## 2014-04-09 ENCOUNTER — Ambulatory Visit (INDEPENDENT_AMBULATORY_CARE_PROVIDER_SITE_OTHER): Payer: Medicare Other | Admitting: Family Medicine

## 2014-04-09 VITALS — BP 148/70 | HR 67 | Temp 98.0°F | Ht 67.0 in | Wt 193.6 lb

## 2014-04-09 DIAGNOSIS — Z7189 Other specified counseling: Secondary | ICD-10-CM

## 2014-04-09 DIAGNOSIS — I10 Essential (primary) hypertension: Secondary | ICD-10-CM | POA: Diagnosis not present

## 2014-04-09 DIAGNOSIS — M159 Polyosteoarthritis, unspecified: Secondary | ICD-10-CM | POA: Diagnosis not present

## 2014-04-09 DIAGNOSIS — G8929 Other chronic pain: Secondary | ICD-10-CM

## 2014-04-09 DIAGNOSIS — F0390 Unspecified dementia without behavioral disturbance: Secondary | ICD-10-CM

## 2014-04-09 MED ORDER — HYDROCODONE-ACETAMINOPHEN 10-325 MG PO TABS: 1.0000 | ORAL_TABLET | Freq: Four times a day (QID) | ORAL | Status: DC | PRN

## 2014-04-09 MED ORDER — HYDROCODONE-ACETAMINOPHEN 10-325 MG PO TABS: 1.0000 | ORAL_TABLET | Freq: Three times a day (TID) | ORAL | Status: DC | PRN

## 2014-04-09 MED ORDER — DONEPEZIL HCL 5 MG PO TABS: ORAL_TABLET | ORAL | Status: DC

## 2014-04-09 NOTE — Assessment & Plan Note (Signed)
Drug contract signed.

## 2014-04-09 NOTE — Patient Instructions (Signed)
See me in three months.   Thank your for coming in. Merry Christmas.

## 2014-04-09 NOTE — Assessment & Plan Note (Signed)
Refill aricept

## 2014-04-10 NOTE — Progress Notes (Signed)
   Subjective:    Patient ID: Juan Li, male    DOB: 05-26-1931, 80 y.o.   MRN: [id number]  HPI Asked to come in to specifically discuss chronic pain and use of long term opiates.  He has no red flags and I am doing this primarily to comply with best practices/new clinic policy.  We specifically discussed that I cannot leave prescription up front for narcotics.  I am happy to see him q3 months with a three month supply each visit.  We reviewed and he accepts the drug contract.  Wife also in the room (helpful because of his early dementia.  No constipation Ambulates with a walker.  He is at fall risk - yet I am extremely limited in my ability to decrease that risk   Review of Systems        Objective:   Physical Exam Lungs clear Cardiac RRR with 2/6 SEM BP at goal (<160)  Do not want to overtreat due to fall risk.       Assessment & Plan:

## 2014-04-10 NOTE — Assessment & Plan Note (Signed)
At goal. No changes.  

## 2014-04-14 ENCOUNTER — Other Ambulatory Visit: Payer: Self-pay | Admitting: Family Medicine

## 2014-04-14 MED ORDER — MEMANTINE HCL 5 MG PO TABS: 5.0000 mg | ORAL_TABLET | Freq: Two times a day (BID) | ORAL | Status: DC

## 2014-04-14 NOTE — Telephone Encounter (Signed)
Requesting refill on namenda, rx has expired, [facility] to [facility]

## 2014-04-16 ENCOUNTER — Other Ambulatory Visit: Payer: Self-pay

## 2014-04-16 ENCOUNTER — Encounter: Payer: Self-pay | Admitting: Family Medicine

## 2014-04-16 DIAGNOSIS — F0390 Unspecified dementia without behavioral disturbance: Secondary | ICD-10-CM

## 2014-04-16 MED ORDER — ENALAPRIL MALEATE 10 MG PO TABS: ORAL_TABLET | ORAL | Status: DC

## 2014-04-16 NOTE — Progress Notes (Signed)
Patient requests refill for Namenda 5 mg to be sent to [facility] [address]. Please follow up with Patient.

## 2014-04-17 ENCOUNTER — Other Ambulatory Visit: Payer: Self-pay | Admitting: *Deleted

## 2014-04-17 MED ORDER — ENALAPRIL MALEATE 10 MG PO TABS: ORAL_TABLET | ORAL | Status: DC

## 2014-04-17 MED ORDER — MEMANTINE HCL 5 MG PO TABS: 5.0000 mg | ORAL_TABLET | Freq: Two times a day (BID) | ORAL | Status: DC

## 2014-04-17 NOTE — Progress Notes (Signed)
Sent in per Dr. Andria Frames

## 2014-05-01 ENCOUNTER — Ambulatory Visit (INDEPENDENT_AMBULATORY_CARE_PROVIDER_SITE_OTHER): Payer: Medicare Other | Admitting: Pharmacist

## 2014-05-01 DIAGNOSIS — Z954 Presence of other heart-valve replacement: Secondary | ICD-10-CM

## 2014-05-01 DIAGNOSIS — I359 Nonrheumatic aortic valve disorder, unspecified: Secondary | ICD-10-CM

## 2014-05-01 DIAGNOSIS — Z952 Presence of prosthetic heart valve: Secondary | ICD-10-CM

## 2014-05-01 DIAGNOSIS — I350 Nonrheumatic aortic (valve) stenosis: Secondary | ICD-10-CM

## 2014-05-01 LAB — POCT INR: INR: 4.1

## 2014-05-09 ENCOUNTER — Other Ambulatory Visit: Payer: Self-pay | Admitting: *Deleted

## 2014-05-09 DIAGNOSIS — F0390 Unspecified dementia without behavioral disturbance: Secondary | ICD-10-CM

## 2014-05-12 MED ORDER — MEMANTINE HCL 5 MG PO TABS: 5.0000 mg | ORAL_TABLET | Freq: Two times a day (BID) | ORAL | Status: DC

## 2014-05-12 MED ORDER — WARFARIN SODIUM 5 MG PO TABS: ORAL_TABLET | ORAL | Status: DC

## 2014-05-15 ENCOUNTER — Ambulatory Visit (INDEPENDENT_AMBULATORY_CARE_PROVIDER_SITE_OTHER): Payer: Medicare Other | Admitting: *Deleted

## 2014-05-15 DIAGNOSIS — Z954 Presence of other heart-valve replacement: Secondary | ICD-10-CM

## 2014-05-15 DIAGNOSIS — I359 Nonrheumatic aortic valve disorder, unspecified: Secondary | ICD-10-CM

## 2014-05-15 DIAGNOSIS — I350 Nonrheumatic aortic (valve) stenosis: Secondary | ICD-10-CM

## 2014-05-15 DIAGNOSIS — Z952 Presence of prosthetic heart valve: Secondary | ICD-10-CM

## 2014-05-15 LAB — POCT INR: INR: 3.2

## 2014-06-02 ENCOUNTER — Other Ambulatory Visit: Payer: Self-pay | Admitting: *Deleted

## 2014-06-02 ENCOUNTER — Encounter: Payer: Self-pay | Admitting: Cardiology

## 2014-06-02 ENCOUNTER — Ambulatory Visit (INDEPENDENT_AMBULATORY_CARE_PROVIDER_SITE_OTHER): Payer: Medicare Other | Admitting: *Deleted

## 2014-06-02 ENCOUNTER — Ambulatory Visit (INDEPENDENT_AMBULATORY_CARE_PROVIDER_SITE_OTHER): Payer: Medicare Other | Admitting: Cardiology

## 2014-06-02 VITALS — BP 122/70 | HR 54 | Ht 67.0 in | Wt 196.0 lb

## 2014-06-02 DIAGNOSIS — Z952 Presence of prosthetic heart valve: Secondary | ICD-10-CM

## 2014-06-02 DIAGNOSIS — Z954 Presence of other heart-valve replacement: Secondary | ICD-10-CM

## 2014-06-02 DIAGNOSIS — R2689 Other abnormalities of gait and mobility: Secondary | ICD-10-CM

## 2014-06-02 DIAGNOSIS — R29818 Other symptoms and signs involving the nervous system: Secondary | ICD-10-CM

## 2014-06-02 DIAGNOSIS — I350 Nonrheumatic aortic (valve) stenosis: Secondary | ICD-10-CM

## 2014-06-02 DIAGNOSIS — I5032 Chronic diastolic (congestive) heart failure: Secondary | ICD-10-CM | POA: Diagnosis not present

## 2014-06-02 DIAGNOSIS — I359 Nonrheumatic aortic valve disorder, unspecified: Secondary | ICD-10-CM

## 2014-06-02 LAB — POCT INR: INR: 1.9

## 2014-06-02 MED ORDER — POTASSIUM CHLORIDE CRYS ER 20 MEQ PO TBCR: EXTENDED_RELEASE_TABLET | ORAL | Status: DC

## 2014-06-02 MED ORDER — FUROSEMIDE 40 MG PO TABS: ORAL_TABLET | ORAL | Status: DC

## 2014-06-02 NOTE — Patient Instructions (Addendum)
Take lasix (furosmide) 40mg  every other day.  Take KCL (potassium) 20 meq every other day.   Your physician recommends that you return for lab work today--BMET/Lipid profile/Liver profile/CBCd  Your physician recommends that you schedule a follow-up appointment in: 4 months with Dr Aundra Dubin.   You have been referred to Dr Tat for balance disorder.

## 2014-06-03 LAB — BASIC METABOLIC PANEL
BUN: 15 mg/dL (ref 6–23)
CHLORIDE: 104 meq/L (ref 96–112)
CO2: 26 mEq/L (ref 19–32)
CREATININE: 1.22 mg/dL (ref 0.40–1.50)
Calcium: 9.5 mg/dL (ref 8.4–10.5)
GFR: 60.67 mL/min (ref >60.00)
GLUCOSE: 91 mg/dL (ref 70–99)
Potassium: 4.6 mEq/L (ref 3.5–5.1)
Sodium: 136 mEq/L (ref 135–145)

## 2014-06-03 LAB — CBC WITH DIFFERENTIAL/PLATELET
Basophils Absolute: 0.1 10*3/uL (ref 0.0–0.1)
Basophils Relative: 0.8 % (ref 0.0–3.0)
EOS PCT: 2.8 % (ref 0.0–5.0)
Eosinophils Absolute: 0.3 10*3/uL (ref 0.0–0.7)
HCT: 44.8 % (ref 39.0–52.0)
Hemoglobin: 15 g/dL (ref 13.0–17.0)
Lymphocytes Relative: 19.6 % (ref 12.0–46.0)
Lymphs Abs: 2.2 10*3/uL (ref 0.7–4.0)
MCHC: 33.4 g/dL (ref 30.0–36.0)
MCV: 93.9 fl (ref 78.0–100.0)
Monocytes Absolute: 0.9 10*3/uL (ref 0.1–1.0)
Monocytes Relative: 8.2 % (ref 3.0–12.0)
NEUTROS PCT: 68.6 % (ref 43.0–77.0)
Neutro Abs: 7.5 10*3/uL (ref 1.4–7.7)
Platelets: 218 10*3/uL (ref 150.0–400.0)
RBC: 4.77 Mil/uL (ref 4.22–5.81)
RDW: 14.7 % (ref 11.5–15.5)
WBC: 11 10*3/uL — ABNORMAL HIGH (ref 4.0–10.5)

## 2014-06-03 LAB — LIPID PANEL
Cholesterol: 174 mg/dL (ref 0–200)
HDL: 39.2 mg/dL (ref >39.00)
LDL Cholesterol: 99 mg/dL (ref 0–99)
NonHDL: 134.8
Total CHOL/HDL Ratio: 4
Triglycerides: 179 mg/dL — ABNORMAL HIGH (ref 0.0–149.0)
VLDL: 35.8 mg/dL (ref 0.0–40.0)

## 2014-06-03 LAB — HEPATIC FUNCTION PANEL
ALBUMIN: 4.4 g/dL (ref 3.5–5.2)
ALK PHOS: 59 U/L (ref 39–117)
ALT: 11 U/L (ref 0–53)
AST: 20 U/L (ref 0–37)
BILIRUBIN TOTAL: 1.3 mg/dL — AB (ref 0.2–1.2)
Bilirubin, Direct: 0.2 mg/dL (ref 0.0–0.3)
Total Protein: 7.3 g/dL (ref 6.0–8.3)

## 2014-06-03 NOTE — Progress Notes (Signed)
Patient ID: Juan Li, male   DOB: Nov 19, 1931, 81 y.o.   MRN: [id number] PCP: Dr. Andria Li  81 yo with history of AS s/p St Jude AVR and chronic diastolic CHF presents for cardiology followup.  His valve was replaced in 1993.  Echo in 10/13 showed normal EF and a well-seated mechanical valve.  Cardiac cath in 1993 showed nonobstructive disease.  He has been started on Namenda and Aricept for some degree of dementia.  He had a holter monitor in 6/15 due to bradycardia, this actually showed a 17 beat run of NSVT and short runs of atrial tachycardia.  Otherwise, average HR was 61 and it was decided that he was too bradycardic to increase beta blocker.  Lexiscan Cardiolite was done in 6/15 due to NSVT and showed no ischemia.   Patient seems clinically stable.  He is not taking his Lasix as he does not like having to urinate a lot.  Weight is actually stable.  He has stable dyspnea if he walks a long distance; he does well generally in the house and with short distances.  No chest pain.  No palpitations.  No orthopnea/PND.   Main complaint today is imbalance. He has some tingling in his feet as well.   Labs (1/12): K 4.8, creatinine 1.3 Labs (12/12): creatinine 1.2, LDL 177 Labs (10/13): creatinine 1.6 => 1.1, LDL 135 Labs (12/13): LDL 74, HDL 30 Labs (4/14): K 4.6 => 4.8, creatinine 1.1 => 1.3, TSH normal, HCT 37.6, BNP 180 Labs (6/14): K 4.3, creatinine 1.3 Labs (8/14): K 4.8, creatinine 1.5, BNP 100 Labs (10/14): K 4.1, creatinine 1.3, BNP 184, LDL 100, HDL 35 Labs (1/15): K 4, creatinine 1.4, BNP 204 Labs (3/15): K 4.6, creatinine 1.4, BNP 110 Labs (7/15): K 3.9, creatinine 1.2 Labs (10/15): K 4.8, creatinine 1.3, LDL 134, HDL 31  PMH: 1. Dementia: On Aricept and Namenda.   2. Hyperlipidemia: myalgias with Vytorin, tolerating pravastatin 3. BPH 4. Osteoarthritis right knee, s/p TKR 10/13.  5. Cervical myelopathy  6. Obesity 7. HTN 8. Aortic stenosis: St. Jude mechanical aortic valve  placed in 1993. Echo (10/13) with EF 65-70% and normal function of the aortic valve (mean gradient 12 mmHg).   9. Cardiac cath in 1993 with nonobstructive disease.  Lexiscan Cardiolite (6/15) with EF 56%, inferior thinning, no ischemia.  10. L-spine disease/spinal stenosis.  11. Abdominal ultrasound in 12/11 did not show AAA 12. Gout 13. Shoulder surgery 1/12 14. CKD 15. Chronic diastolic CHF: Echo (76/19) with EF 65-70%, normally-functioning mechanical aortic valve (mean gradient 12).   16. Sinus bradycardia: Holter (6/15) showed 17 beats NSVT, several short runs atrial tachycardia, HR 46-95 mean 61.    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  Married.  Does not smoke.   FH: No premature CAD.   ROS: All systems reviewed and negative except as per HPI.    Current Outpatient Prescriptions  Medication Sig Dispense Refill  . aspirin EC 81 MG tablet Take 81 mg by mouth every morning.     . AVODART 0.5 MG capsule TAKE 1 CAPSULE (0.5 MG TOTAL) BY MOUTH EVERY EVENING. 90 capsule 3  . Colchicine 0.6 MG CAPS Take one cap TID for 2 days, then one daily until redness resolves. 20 capsule 2  . donepezil (ARICEPT) 5 MG tablet ONE BY MOUTH DAILY 90 tablet 3  . enalapril (VASOTEC) 10 MG tablet TAKE 1 TABLET (10 MG TOTAL) BY MOUTH DAILY. 90 tablet 1  . famotidine (PEPCID) 20  MG tablet Take 1 tablet (20 mg total) by mouth 2 (two) times daily. 30 tablet 12  . HYDROcodone-acetaminophen (NORCO) 10-325 MG per tablet Take 1 tablet by mouth every 8 (eight) hours as needed. Do not fill until sixty days after Rx date 120 tablet 0  . memantine (NAMENDA) 5 MG tablet Take 1 tablet (5 mg total) by mouth 2 (two) times daily. 180 tablet 3  . metoprolol tartrate (LOPRESSOR) 25 MG tablet TAKE 1 TABLET (25 MG TOTAL) BY MOUTH 2 (TWO) TIMES DAILY. 180 tablet 3  . naproxen (NAPROSYN) 500 MG tablet Take 1 tablet (500 mg total) by mouth 2 (two) times daily with a meal. 30 tablet 0  . pravastatin (PRAVACHOL) 40 MG  tablet TAKE 1 TABLET EVERY EVENING 30 tablet 5  . tamsulosin (FLOMAX) 0.4 MG CAPS capsule Take 1 capsule (0.4 mg total) by mouth daily. 90 capsule 3  . Vitamin D, Ergocalciferol, (DRISDOL) 50000 UNITS CAPS capsule TAKE 1 CAPSULE BY MOUTH DAILY FOR 1 WEEK THEN WEEKLY THEREAFTER 15 capsule 3  . warfarin (COUMADIN) 5 MG tablet TAKE AS DIRECTED 100 tablet PRN  . furosemide (LASIX) 40 MG tablet 1 tablet by mouth every other day 45 tablet 1  . potassium chloride SA (K-DUR,KLOR-CON) 20 MEQ tablet 1 tablet by mouth every other day 45 tablet 1   No current facility-administered medications for this visit.    BP 122/70 mmHg  Pulse 54  Ht 5\' 7"  (1.702 m)  Wt 196 lb (88.905 kg)  BMI 30.69 kg/m2  SpO2 98% General: NAD, overweight Neck: Thick, JVP 8 cm with HJR, no thyromegaly or thyroid nodule.  Lungs: Clear to auscultation bilaterally with normal respiratory effort. CV: Nondisplaced PMI.  Heart regular S1/S2, mechanical S2, no S3/S4, 1/6 SEM RUSB.  1+ edema at ankles bilaterally.  No carotid bruit.  Normal pedal pulses.  Abdomen: Soft, nontender, no hepatosplenomegaly, no distention.  Neurologic: Alert and oriented x 3.  Psych: Normal affect. Extremities: No clubbing or cyanosis.   Assessment/Plan:  Aortic valve disorders  Mechanical aortic valve.  No bleeding problems on coumadin. Continue ASA 81 also.   Valve appeared normal on 10/13 echo.  Crisp S2 on exam today.  HYPERCHOLESTEROLEMIA  Tolerating pravastatin.  Check lipids today. HYPERTENSION BP ok today.   Chronic diastolic CHF Mr Juan Li does appear mildly volume overloaded with stable NYHA class II symptoms though not very active.  He has not been taking Lasix.  I asked him to at least take Lasix 40 mg every other day with KCl 10 mEq every other day.  BMET today.   Bradycardia History of bradycardia, HR in 50s today (ok).  Imbalance Patient is having increasing difficulty with balance.  He is not lightheaded.  He does have tingling  in his feet so may have peripheral neuropathy.  Will arrange for neurology evaluation.   Followup 4 months   Juan Li 06/03/2014

## 2014-06-10 ENCOUNTER — Encounter: Payer: Self-pay | Admitting: Neurology

## 2014-06-10 ENCOUNTER — Ambulatory Visit (INDEPENDENT_AMBULATORY_CARE_PROVIDER_SITE_OTHER): Payer: Medicare Other | Admitting: Neurology

## 2014-06-10 VITALS — BP 120/62 | HR 60 | Ht 66.0 in | Wt 194.0 lb

## 2014-06-10 DIAGNOSIS — R0989 Other specified symptoms and signs involving the circulatory and respiratory systems: Secondary | ICD-10-CM | POA: Diagnosis not present

## 2014-06-10 DIAGNOSIS — G629 Polyneuropathy, unspecified: Secondary | ICD-10-CM

## 2014-06-10 DIAGNOSIS — R209 Unspecified disturbances of skin sensation: Secondary | ICD-10-CM | POA: Diagnosis not present

## 2014-06-10 DIAGNOSIS — M4802 Spinal stenosis, cervical region: Secondary | ICD-10-CM | POA: Diagnosis not present

## 2014-06-10 DIAGNOSIS — M542 Cervicalgia: Secondary | ICD-10-CM | POA: Diagnosis not present

## 2014-06-10 DIAGNOSIS — R292 Abnormal reflex: Secondary | ICD-10-CM

## 2014-06-10 DIAGNOSIS — G609 Hereditary and idiopathic neuropathy, unspecified: Secondary | ICD-10-CM | POA: Diagnosis not present

## 2014-06-10 DIAGNOSIS — R7309 Other abnormal glucose: Secondary | ICD-10-CM | POA: Diagnosis not present

## 2014-06-10 DIAGNOSIS — Q7649 Other congenital malformations of spine, not associated with scoliosis: Secondary | ICD-10-CM

## 2014-06-10 DIAGNOSIS — R202 Paresthesia of skin: Secondary | ICD-10-CM

## 2014-06-10 DIAGNOSIS — R739 Hyperglycemia, unspecified: Secondary | ICD-10-CM

## 2014-06-10 LAB — HEMOGLOBIN A1C
Hgb A1c MFr Bld: 5.8 % — ABNORMAL HIGH (ref <5.7)
Mean Plasma Glucose: 120 mg/dL — ABNORMAL HIGH (ref <117)

## 2014-06-10 NOTE — Progress Notes (Signed)
Juan Li was seen today in the movement disorders clinic for neurologic consultation at the request of Zigmund Gottron, MD.  The consultation is for the evaluation of gait changes.  I reviewed prior records available to me.  He was seen by [facility] neurology for this in 2007, and at that time had reported gait changes for at least 3 years.  He saw Dr. Leonie Man (pt doesn't remember this visit at all) and a trial of Sinemet was initiated.  He doesn't remember if it worked as he doesn't remember the visit. Only one note was available.  However, it is known that the patient has congenital spinal canal stenosis and he had surgery prior to seeing Dr. Leonie Man but that didn't help his balance.  His last MRI of the brain was in 2007 and it revealed atrophy and small vessel disease.  There were enlarged ventricles but not necessarily out of proportion to the degree of atrophy.  When it first started, he would "get it in a running mode" and he couldn't stop but that has gone away.  Now, he "walks like a drunk" and is not stable.  His wife thinks that his mind is "muddled" and it affects the walking.  He admits that he is dizzy, which means he is lightheaded.  There is no spinning. There is some paresthesias of the right side of the face x 1 year.  It comes and goes.  There is no paresthesias of the feet.  There are no falls now; his last fall was over a year ago and perhaps up to 3-4 years ago.     Specific Symptoms:  Tremor: No. Voice: no changes Sleep: sleeps well  Vivid Dreams:  No.  Acting out dreams:  No. Wet Pillows: Yes.   Postural symptoms:  Yes.    Falls?  No. Bradykinesia symptoms: difficulty getting out of a chair and difficulty regaining balance Loss of smell:  No. Loss of taste:  No. Urinary Incontinence:  Yes.   Difficulty Swallowing:  No. Handwriting, micrographia: Yes.   Trouble with ADL's:  No.  Trouble buttoning clothing: Yes.   Depression:  Yes.   Memory changes:  No. per  pt; yes per wife (remembers to take pills; wife pays bills because patients handwriting bad; wife drives) Hallucinations:  No.  visual distortions: No. N/V:  No. Lightheaded:  Yes.    Syncope: No. Diplopia:  No. Dyskinesia:  No.   Was seen at [facility] years ago and told he did not have PD  ALLERGIES:  No Known Allergies  CURRENT MEDICATIONS:  Outpatient Encounter Prescriptions as of 06/10/2014  Medication Sig  . aspirin EC 81 MG tablet Take 81 mg by mouth every morning.   . AVODART 0.5 MG capsule TAKE 1 CAPSULE (0.5 MG TOTAL) BY MOUTH EVERY EVENING.  Marland Kitchen donepezil (ARICEPT) 5 MG tablet ONE BY MOUTH DAILY  . enalapril (VASOTEC) 10 MG tablet TAKE 1 TABLET (10 MG TOTAL) BY MOUTH DAILY.  . furosemide (LASIX) 40 MG tablet 1 tablet by mouth every other day  . HYDROcodone-acetaminophen (NORCO) 10-325 MG per tablet Take 1 tablet by mouth every 8 (eight) hours as needed. Do not fill until sixty days after Rx date  . memantine (NAMENDA) 5 MG tablet Take 1 tablet (5 mg total) by mouth 2 (two) times daily.  . metoprolol tartrate (LOPRESSOR) 25 MG tablet TAKE 1 TABLET (25 MG TOTAL) BY MOUTH 2 (TWO) TIMES DAILY.  Marland Kitchen potassium chloride SA (K-DUR,KLOR-CON) 20 MEQ  tablet 1 tablet by mouth every other day  . pravastatin (PRAVACHOL) 40 MG tablet TAKE 1 TABLET EVERY EVENING  . tamsulosin (FLOMAX) 0.4 MG CAPS capsule Take 1 capsule (0.4 mg total) by mouth daily.  . Vitamin D, Ergocalciferol, (DRISDOL) 50000 UNITS CAPS capsule TAKE 1 CAPSULE BY MOUTH DAILY FOR 1 WEEK THEN WEEKLY THEREAFTER  . warfarin (COUMADIN) 5 MG tablet TAKE AS DIRECTED  . [DISCONTINUED] Colchicine 0.6 MG CAPS Take one cap TID for 2 days, then one daily until redness resolves.  . [DISCONTINUED] famotidine (PEPCID) 20 MG tablet Take 1 tablet (20 mg total) by mouth 2 (two) times daily.  . [DISCONTINUED] naproxen (NAPROSYN) 500 MG tablet Take 1 tablet (500 mg total) by mouth 2 (two) times daily with a meal.    PAST MEDICAL HISTORY:     Past Medical History  Diagnosis Date  . Heart murmur   . Complication of anesthesia 02-15-12    very confused, and very slow to awaken after anesthesia  . Coronary artery disease 02-15-12    Heart valve replaced   . Hypercholesterolemia 02-15-12  . Hypertension   . Arthritis 02-15-12    osteoarthritis,right knee, pain right wrist.,    PAST SURGICAL HISTORY:   Past Surgical History  Procedure Laterality Date  . Neck surgery    . Cardiac valve replacement    . Shoulder open rotator cuff repair  02-15-12    left  . Total knee arthroplasty  02/20/2012    Procedure: TOTAL KNEE ARTHROPLASTY;  Surgeon: Gearlean Alf, MD;  Location: [facility];  Service: Orthopedics;  Laterality: Right;    SOCIAL HISTORY:   History   Social History  . Marital Status: Married    Spouse Name: [name]    Number of Children: 3  . Years of Education: N/A   Occupational History  . 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 Smoking status: Former Smoker    Quit date: 08/10/1995  . Smokeless tobacco: Never Used  . Alcohol Use: Yes     Comment: 1-2x a year  . Drug Use: No  . Sexual Activity: Yes   Other Topics Concern  . Not on file   Social History Narrative   Health Care POA: Wife, [place]   Emergency Contact: wife, [place], [phone] (c)   End of Life Plan:   Who lives with you: Patient lives with wife.   Any pets:    Diet: Patient has a varied diet of protein, starch, and vegetables.   Exercise: Patient does not have a regular exercise plan, afraid of falling.   Seatbelts: Patient reports wearing seatbelt when in vehicle.    Sun Exposure/Protection: Patient does not wear sun protection.   Hobbies: Patient likes to do yard work.          FAMILY HISTORY:   Family Status  Relation Status Death Age  . Mother Deceased 62    heart disease  . Father Deceased 13    heart disease  . Sister Alive     unknown  . Sister Deceased     unknown   . Son Alive     healthy  . Son Alive     healthy  . Daughter Deceased     breast cancer    ROS:  A complete 10 system review of systems was obtained and was unremarkable apart from what is mentioned above.  PHYSICAL EXAMINATION:    VITALS:   Filed  Vitals:   06/10/14 1243  BP: 120/62  Pulse: 60  Height: 5\' 6"  (1.676 m)  Weight: 194 lb (87.998 kg)    GEN:  The patient appears stated age and is in NAD. HEENT:  Normocephalic, atraumatic.  The mucous membranes are moist. The superficial temporal arteries are without ropiness or tenderness. CV:  RRR with 3/6 SEM Lungs:  CTAB Neck/HEME:  There are carotid bruits bilaterally.  Neurological examination:  Orientation: The patient is alert and oriented x3.  Cranial nerves: There is good facial symmetry. Pupils are equal round and reactive to light bilaterally. Fundoscopic exam is attempted but the disc margins are not well visualized bilaterally. Extraocular muscles are intact except some upgaze paresis. The visual fields are full to confrontational testing. The speech is fluent and clear. Soft palate rises symmetrically and there is no tongue deviation. Hearing is intact to conversational tone. Sensation: Sensation is intact to light and pinprick throughout (facial, trunk, extremities). Pinprick not decreased in a stocking distribution.  Vibration is decreased distally. There is no extinction with double simultaneous stimulation. There is no sensory dermatomal level identified. Motor: Strength is 5/5 in the bilateral upper and lower extremities.   Shoulder shrug is equal and symmetric.  There is no pronator drift. Deep tendon reflexes: Deep tendon reflexes are 3/4 at the bilateral biceps, triceps, brachioradialis, patella and 1/4 at the bilateral achilles. There are cross adductor reflexes at the patella bilaterally.  Plantar responses are downgoing bilaterally.  Movement examination: Tone: There is normal tone in the bilateral upper  extremities.  The tone in the lower extremities is normal.  Abnormal movements: none Coordination:  There is no decremation with RAM's, with any form of RAM's, including alternating supination and pronation of the forearm, hand opening and closing, finger taps, heel taps and toe taps. Gait and Station: The patient has some difficulty arising out of a deep-seated chair without the use of the hands. The patient's stride length is decreased, wide based with shuffling to the gait.     Chemistry      Component Value Date/Time   NA 136 06/02/2014 1617   K 4.6 06/02/2014 1617   CL 104 06/02/2014 1617   CO2 26 06/02/2014 1617   BUN 15 06/02/2014 1617   CREATININE 1.22 06/02/2014 1617   CREATININE 1.21 11/27/2013 1651      Component Value Date/Time   CALCIUM 9.5 06/02/2014 1617   ALKPHOS 59 06/02/2014 1617   AST 20 06/02/2014 1617   ALT 11 06/02/2014 1617   BILITOT 1.3* 06/02/2014 1617     Lab Results  Component Value Date   TSH 2.618 11/27/2013     No results found for: HGBA1C  Lab Results  Component Value Date   VITAMINB12 694 11/27/2013     ASSESSMENT/PLAN:  1.  Gait instability  -think multifactorial.  Do see evidence of a peripheral neuropathy, idiopathic.  Many labs already completed.  Mildly hyperglycemic in July so will check hgbA1c to make sure that we are not missing anything else in terms of reversible causes.  Discussed safety.  Pt education provided.  -Has hx of congenital cervical stenosis.  Has had surgical intervention and continues to have neck pain with hyperreflexia.  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 2.  Facial paresthesias on R  -been going on a year  -MRI brain will be  ordered 3.  Carotid bruits  -order carotid u/s 4.  F/u after above completed.  Greater than 50% of 60 min visit in  counseling and coordinating care.

## 2014-06-10 NOTE — Patient Instructions (Signed)
1. Your provider has requested that you have labwork completed today. Please go to [facility] on the first floor of this building before leaving the office today. 2. We have scheduled you at [facility] for your MRI on 07/02/14 at 1:00 pm. Please arrive 15 minutes prior and go to 1st floor radiology. If you need to reschedule for any reason please call [phone]. 3. We have you scheduled for your Carotid Doppler on 06/11/14 at 1:00pm. Please arrive 15 minutes prior and go to [facility], section A at [facility]. If this is not a good date/time please call [phone] to reschedule.

## 2014-06-11 ENCOUNTER — Telehealth: Payer: Self-pay | Admitting: Neurology

## 2014-06-11 ENCOUNTER — Ambulatory Visit (HOSPITAL_COMMUNITY)
Admission: RE | Admit: 2014-06-11 | Discharge: 2014-06-11 | Disposition: A | Discharge status: Home / Self Care | Payer: Medicare Other | Source: Ambulatory Visit | Attending: Neurology | Admitting: Neurology

## 2014-06-11 DIAGNOSIS — R0989 Other specified symptoms and signs involving the circulatory and respiratory systems: Secondary | ICD-10-CM | POA: Insufficient documentation

## 2014-06-11 DIAGNOSIS — I6523 Occlusion and stenosis of bilateral carotid arteries: Secondary | ICD-10-CM

## 2014-06-11 LAB — FOLATE: Folate: 6.5 ng/mL

## 2014-06-11 LAB — RPR

## 2014-06-11 NOTE — Telephone Encounter (Signed)
Report called on Carotid Ultrasound that he has right 60% stenosis and left 80% stenosis. Per Dr Carles Collet - patient needs referral to Cardiothoracic surgery. Referral placed in EPIC and message left for Jenny Reichmann, new patient coordinator with [facility]. LMOM for patient to call me back to let him know about results and referral.

## 2014-06-11 NOTE — Progress Notes (Signed)
VASCULAR LAB PRELIMINARY  PRELIMINARY  PRELIMINARY  PRELIMINARY  Carotid duplex completed.    Preliminary report: Right ICA demonstrated 40-59% stenosis and left ICA demonstrated >80% stenosis. Dr.Tat's office was notified and her nurse, Luvenia Starch, was given results.  Hulan Fray, RVT 06/11/2014, 2:55 PM

## 2014-06-12 LAB — UIFE/LIGHT CHAINS/TP QN, 24-HR UR
ALBUMIN, U: DETECTED
Alpha 1, Urine: DETECTED — AB
Alpha 2, Urine: DETECTED — AB
BETA UR: DETECTED — AB
Gamma Globulin, Urine: DETECTED — AB
Total Protein, Urine: 36 mg/dL — ABNORMAL HIGH (ref 5–25)

## 2014-06-12 LAB — SPEP & IFE WITH QIG
Albumin ELP: 58.7 % (ref 55.8–66.1)
Alpha-1-Globulin: 4 % (ref 2.9–4.9)
Alpha-2-Globulin: 8.9 % (ref 7.1–11.8)
Beta 2: 4.7 % (ref 3.2–6.5)
Beta Globulin: 5.6 % (ref 4.7–7.2)
Gamma Globulin: 18.1 % (ref 11.1–18.8)
IgA: 175 mg/dL (ref 68–379)
IgG (Immunoglobin G), Serum: 1360 mg/dL (ref 650–1600)
IgM, Serum: 86 mg/dL (ref 41–251)
TOTAL PROTEIN, SERUM ELECTROPHOR: 7.6 g/dL (ref 6.0–8.3)

## 2014-06-12 NOTE — Telephone Encounter (Signed)
Patient made aware of results. He is to call me back if he does not hear from the Cardiothoracic surgery office.

## 2014-06-12 NOTE — Telephone Encounter (Signed)
Juan Li, CMA           This patient's referral should go to [facility] they handle Carotid Stenosis. Can you change this in the workque? Thanks    Spoke with Juan Li, could not change referral- but referral cancelled and new referral sent to [facility]. Spoke with Juan Li at their office and thay have referral and will contact patient directly with appt.

## 2014-06-12 NOTE — Addendum Note (Signed)
Addended byMargarette Asal L on: 06/12/2014 11:11 AM   Modules accepted: Orders

## 2014-06-17 ENCOUNTER — Telehealth: Payer: Self-pay | Admitting: Neurology

## 2014-06-17 ENCOUNTER — Other Ambulatory Visit: Payer: Self-pay | Admitting: *Deleted

## 2014-06-17 ENCOUNTER — Ambulatory Visit (INDEPENDENT_AMBULATORY_CARE_PROVIDER_SITE_OTHER): Payer: Medicare Other | Admitting: *Deleted

## 2014-06-17 DIAGNOSIS — Z954 Presence of other heart-valve replacement: Secondary | ICD-10-CM | POA: Diagnosis not present

## 2014-06-17 DIAGNOSIS — I359 Nonrheumatic aortic valve disorder, unspecified: Secondary | ICD-10-CM

## 2014-06-17 DIAGNOSIS — I350 Nonrheumatic aortic (valve) stenosis: Secondary | ICD-10-CM

## 2014-06-17 DIAGNOSIS — I6523 Occlusion and stenosis of bilateral carotid arteries: Secondary | ICD-10-CM

## 2014-06-17 DIAGNOSIS — Z952 Presence of prosthetic heart valve: Secondary | ICD-10-CM

## 2014-06-17 LAB — POCT INR: INR: 3.1

## 2014-06-17 NOTE — Telephone Encounter (Signed)
Spoke with patient and he states he has not heard from [facility] about an appt yet. Aware referral just sent Friday, but gave him the number to contact their office directly.

## 2014-06-17 NOTE — Telephone Encounter (Signed)
Pt called wanting to f/u with a nurse regarding setting up some tests that Dr. Carles Collet wanted the pt to have on his last visit. C/b [phone]

## 2014-07-02 ENCOUNTER — Ambulatory Visit (HOSPITAL_COMMUNITY)
Admission: RE | Admit: 2014-07-02 | Discharge: 2014-07-02 | Disposition: A | Discharge status: Home / Self Care | Payer: Medicare Other | Source: Ambulatory Visit | Attending: Neurology | Admitting: Neurology

## 2014-07-02 DIAGNOSIS — G319 Degenerative disease of nervous system, unspecified: Secondary | ICD-10-CM | POA: Insufficient documentation

## 2014-07-02 DIAGNOSIS — M542 Cervicalgia: Secondary | ICD-10-CM | POA: Diagnosis not present

## 2014-07-02 DIAGNOSIS — R292 Abnormal reflex: Secondary | ICD-10-CM | POA: Diagnosis not present

## 2014-07-02 DIAGNOSIS — M4802 Spinal stenosis, cervical region: Secondary | ICD-10-CM | POA: Insufficient documentation

## 2014-07-02 DIAGNOSIS — R202 Paresthesia of skin: Secondary | ICD-10-CM

## 2014-07-02 DIAGNOSIS — I639 Cerebral infarction, unspecified: Secondary | ICD-10-CM | POA: Diagnosis not present

## 2014-07-02 DIAGNOSIS — Q7649 Other congenital malformations of spine, not associated with scoliosis: Secondary | ICD-10-CM

## 2014-07-02 DIAGNOSIS — I6522 Occlusion and stenosis of left carotid artery: Secondary | ICD-10-CM | POA: Insufficient documentation

## 2014-07-02 DIAGNOSIS — M5020 Other cervical disc displacement, unspecified cervical region: Secondary | ICD-10-CM | POA: Diagnosis not present

## 2014-07-02 DIAGNOSIS — R209 Unspecified disturbances of skin sensation: Secondary | ICD-10-CM

## 2014-07-02 MED ORDER — GADOBENATE DIMEGLUMINE 529 MG/ML IV SOLN
20.0000 mL | Freq: Once | INTRAVENOUS | Status: AC | PRN
  Administered 2014-07-02: 20 mL via INTRAVENOUS

## 2014-07-03 ENCOUNTER — Telehealth: Payer: Self-pay | Admitting: Neurology

## 2014-07-03 NOTE — Telephone Encounter (Signed)
Aware of results. Appt made next week to discuss.

## 2014-07-03 NOTE — Telephone Encounter (Signed)
-----   Message from [place], DO sent at 07/03/2014  7:49 AM EST ----- Fusion at C3-5 but disc protrusion at C5-6 with at least mod CCS.  Gerik Coberly, please make pt appt next week or the following to discuss

## 2014-07-04 ENCOUNTER — Ambulatory Visit: Payer: Medicare Other | Admitting: Neurology

## 2014-07-08 ENCOUNTER — Ambulatory Visit (INDEPENDENT_AMBULATORY_CARE_PROVIDER_SITE_OTHER): Payer: Medicare Other | Admitting: *Deleted

## 2014-07-08 DIAGNOSIS — I359 Nonrheumatic aortic valve disorder, unspecified: Secondary | ICD-10-CM

## 2014-07-08 DIAGNOSIS — I350 Nonrheumatic aortic (valve) stenosis: Secondary | ICD-10-CM

## 2014-07-08 DIAGNOSIS — Z954 Presence of other heart-valve replacement: Secondary | ICD-10-CM | POA: Diagnosis not present

## 2014-07-08 DIAGNOSIS — Z952 Presence of prosthetic heart valve: Secondary | ICD-10-CM

## 2014-07-08 LAB — POCT INR: INR: 3.1

## 2014-07-10 ENCOUNTER — Encounter: Payer: Self-pay | Admitting: Neurology

## 2014-07-10 ENCOUNTER — Ambulatory Visit (INDEPENDENT_AMBULATORY_CARE_PROVIDER_SITE_OTHER): Payer: Medicare Other | Admitting: Neurology

## 2014-07-10 VITALS — BP 140/82 | HR 76 | Ht 66.0 in | Wt 193.0 lb

## 2014-07-10 DIAGNOSIS — I6523 Occlusion and stenosis of bilateral carotid arteries: Secondary | ICD-10-CM

## 2014-07-10 DIAGNOSIS — G919 Hydrocephalus, unspecified: Secondary | ICD-10-CM

## 2014-07-10 DIAGNOSIS — M4802 Spinal stenosis, cervical region: Secondary | ICD-10-CM

## 2014-07-10 NOTE — Progress Notes (Signed)
Juan Li was seen today in the movement disorders clinic for neurologic consultation at the request of Zigmund Gottron, MD.  The consultation is for the evaluation of gait changes.  I reviewed prior records available to me.  He was seen by [facility] neurology for this in 2007, and at that time had reported gait changes for at least 3 years.  He saw Dr. Leonie Man (pt doesn't remember this visit at all) and a trial of Sinemet was initiated.  He doesn't remember if it worked as he doesn't remember the visit. Only one note was available.  However, it is known that the patient has congenital spinal canal stenosis and he had surgery prior to seeing Dr. Leonie Man but that didn't help his balance.  His last MRI of the brain was in 2007 and it revealed atrophy and small vessel disease.  There were enlarged ventricles but not necessarily out of proportion to the degree of atrophy.  When it first started, he would "get it in a running mode" and he couldn't stop but that has gone away.  Now, he "walks like a drunk" and is not stable.  His wife thinks that his mind is "muddled" and it affects the walking.  He admits that he is dizzy, which means he is lightheaded.  There is no spinning. There is some paresthesias of the right side of the face x 1 year.  It comes and goes.  There is no paresthesias of the feet.  There are no falls now; his last fall was over a year ago and perhaps up to 3-4 years ago.     Was seen at [facility] years ago and told he did not have PD  07/10/14 update:  The patient is seen today for follow-up on his testing results.  He had a carotid ultrasound since last visit.  There is right internal carotid artery stenosis of 40-59% and left ICA stenosis of greater than 80%.  MRI of the brain revealed small vessel disease, atrophy and enlarged ventricles, slightly more than what one would expect for the degree of atrophy.  There was evidence of complete left ICA occlusion on the MRI of the brain.   MRI of the cervical spine demonstrated evidence of his prior fusion at the C3-C5 levels.  There was evidence of new moderate C5-C6 central canal stenosis and moderate to severe right greater than left neural foraminal stenosis at the C6-C7 levels.  This, along with a degree of peripheral neuropathy could be contributing to his gait instability.  His hemoglobin A1c was normal at 5.8.  ALLERGIES:  No Known Allergies  CURRENT MEDICATIONS:  Outpatient Encounter Prescriptions as of 07/10/2014  Medication Sig  . aspirin EC 81 MG tablet Take 81 mg by mouth every morning.   . AVODART 0.5 MG capsule TAKE 1 CAPSULE (0.5 MG TOTAL) BY MOUTH EVERY EVENING.  Marland Kitchen donepezil (ARICEPT) 5 MG tablet ONE BY MOUTH DAILY  . enalapril (VASOTEC) 10 MG tablet TAKE 1 TABLET (10 MG TOTAL) BY MOUTH DAILY.  . furosemide (LASIX) 40 MG tablet 1 tablet by mouth every other day  . HYDROcodone-acetaminophen (NORCO) 10-325 MG per tablet Take 1 tablet by mouth every 8 (eight) hours as needed. Do not fill until sixty days after Rx date  . memantine (NAMENDA) 5 MG tablet Take 1 tablet (5 mg total) by mouth 2 (two) times daily.  . metoprolol tartrate (LOPRESSOR) 25 MG tablet TAKE 1 TABLET (25 MG TOTAL) BY MOUTH 2 (TWO) TIMES  DAILY.  Marland Kitchen potassium chloride SA (K-DUR,KLOR-CON) 20 MEQ tablet 1 tablet by mouth every other day  . pravastatin (PRAVACHOL) 40 MG tablet TAKE 1 TABLET EVERY EVENING  . tamsulosin (FLOMAX) 0.4 MG CAPS capsule Take 1 capsule (0.4 mg total) by mouth daily.  . Vitamin D, Ergocalciferol, (DRISDOL) 50000 UNITS CAPS capsule TAKE 1 CAPSULE BY MOUTH DAILY FOR 1 WEEK THEN WEEKLY THEREAFTER  . warfarin (COUMADIN) 5 MG tablet TAKE AS DIRECTED    PAST MEDICAL HISTORY:   Past Medical History  Diagnosis Date  . Heart murmur   . Complication of anesthesia 02-15-12    very confused, and very slow to awaken after anesthesia  . Coronary artery disease 02-15-12    Heart valve replaced   . Hypercholesterolemia 02-15-12  .  Hypertension   . Arthritis 02-15-12    osteoarthritis,right knee, pain right wrist.,    PAST SURGICAL HISTORY:   Past Surgical History  Procedure Laterality Date  . Neck surgery    . Cardiac valve replacement    . Shoulder open rotator cuff repair  02-15-12    left  . Total knee arthroplasty  02/20/2012    Procedure: TOTAL KNEE ARTHROPLASTY;  Surgeon: Gearlean Alf, MD;  Location: [facility];  Service: Orthopedics;  Laterality: Right;    SOCIAL HISTORY:   History   Social History  . Marital Status: Married    Spouse Name: [name]  . Number of Children: 3  . Years of Education: N/A   Occupational History  . 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 Smoking status: Former Smoker    Quit date: 08/10/1995  . Smokeless tobacco: Never Used  . Alcohol Use: Yes     Comment: 1-2x a year  . Drug Use: No  . Sexual Activity: Yes   Other Topics Concern  . Not on file   Social History Narrative   Health Care POA: Wife, [place]   Emergency Contact: wife, [place], [phone] (c)   End of Life Plan:   Who lives with you: Patient lives with wife.   Any pets:    Diet: Patient has a varied diet of protein, starch, and vegetables.   Exercise: Patient does not have a regular exercise plan, afraid of falling.   Seatbelts: Patient reports wearing seatbelt when in vehicle.    Sun Exposure/Protection: Patient does not wear sun protection.   Hobbies: Patient likes to do yard work.          FAMILY HISTORY:   Family Status  Relation Status Death Age  . Mother Deceased 54    heart disease  . Father Deceased 54    heart disease  . Sister Alive     unknown  . Sister Deceased     unknown  . Son Alive     healthy  . Son Alive     healthy  . Daughter Deceased     breast cancer    ROS:  A complete 10 system review of systems was obtained and was unremarkable apart from what is mentioned above.  PHYSICAL EXAMINATION:    VITALS:     Filed Vitals:   07/10/14 1408  BP: 140/82  Pulse: 76  Height: 5\' 6"  (1.676 m)  Weight: 193 lb (87.544 kg)    Virtually 100% of the visit in counseling:    Gait and Station: The patient has some difficulty arising out of a deep-seated chair without the use of the  hands. The patient's stride length is decreased, wide based with shuffling to the gait.     Chemistry      Component Value Date/Time   NA 136 06/02/2014 1617   K 4.6 06/02/2014 1617   CL 104 06/02/2014 1617   CO2 26 06/02/2014 1617   BUN 15 06/02/2014 1617   CREATININE 1.22 06/02/2014 1617   CREATININE 1.21 11/27/2013 1651      Component Value Date/Time   CALCIUM 9.5 06/02/2014 1617   ALKPHOS 59 06/02/2014 1617   AST 20 06/02/2014 1617   ALT 11 06/02/2014 1617   BILITOT 1.3* 06/02/2014 1617     Lab Results  Component Value Date   TSH 2.618 11/27/2013      Lab Results  Component Value Date   HGBA1C 5.8* 06/10/2014    Lab Results  Component Value Date   VITAMINB12 694 11/27/2013   Lab Results  Component Value Date   CHOL 174 06/02/2014   HDL 39.20 06/02/2014   LDLCALC 99 06/02/2014   LDLDIRECT 134.0 02/03/2014   TRIG 179.0* 06/02/2014   CHOLHDL 4 06/02/2014     ASSESSMENT/PLAN:  1.  Gait instability  -think multifactorial.  Do see evidence of a peripheral neuropathy but think that cervical stenosis could contribute as well (no evidence of myelopathy).  Has enlarged ventricles out of proportion to atrophy as well.  He doesn't think that he wants to pursue any type of surgery and asked me to call him next week after his appt with vascular surgeon when he has had time to think things over. 2.  Facial paresthesias on R  -been going on a year  -likely due to complete L carotid occlusion, which was seen on the MRI of the brain.  TG elevated and cardiology treating.  Improved compared to 5 months ago when TG were 230 and now 179.  On asa and coumadin 3.  Carotid bruits  -As above, 40-59% stenosis in  the right internal carotid artery and likely complete occlusion on the left.  If completely occluded on the left, nothing further needs to be done.  He has an appointment with vascular surgery next week.  He will need the right one followed closely. 4.  Neck pain due to neural foraminal stenosis.  -As above, does not think he wants surgery.  He will think it over this week and we will call him next week. 5.  F/u as needed

## 2014-07-15 ENCOUNTER — Encounter: Payer: Self-pay | Admitting: Vascular Surgery

## 2014-07-16 ENCOUNTER — Encounter: Payer: Self-pay | Admitting: Vascular Surgery

## 2014-07-16 ENCOUNTER — Ambulatory Visit (INDEPENDENT_AMBULATORY_CARE_PROVIDER_SITE_OTHER): Payer: Medicare Other | Admitting: Vascular Surgery

## 2014-07-16 ENCOUNTER — Ambulatory Visit (HOSPITAL_COMMUNITY)
Admission: RE | Admit: 2014-07-16 | Discharge: 2014-07-16 | Disposition: A | Discharge status: Home / Self Care | Payer: Medicare Other | Source: Ambulatory Visit | Attending: Vascular Surgery | Admitting: Vascular Surgery

## 2014-07-16 ENCOUNTER — Telehealth: Payer: Self-pay | Admitting: Neurology

## 2014-07-16 ENCOUNTER — Telehealth: Payer: Self-pay | Admitting: Cardiology

## 2014-07-16 VITALS — BP 190/55 | HR 58 | Ht 66.0 in | Wt 193.0 lb

## 2014-07-16 DIAGNOSIS — I6523 Occlusion and stenosis of bilateral carotid arteries: Secondary | ICD-10-CM | POA: Insufficient documentation

## 2014-07-16 DIAGNOSIS — I6522 Occlusion and stenosis of left carotid artery: Secondary | ICD-10-CM | POA: Diagnosis not present

## 2014-07-16 NOTE — Telephone Encounter (Signed)
Pt thought he needed appointment to get medical clearance for CEA with Dr. Scot Dock.  Pt does not have any new complaints since last visit with Dr. Carles Collet on 2/16 and has scheduled appt with PCP, Dr. Andria Frames on 3/18.  Pt still experiencing numbness in face from time to time and SOB on exertion.  Pt and wife made aware that information will be share with Dr. Aundra Dubin and he will let him know if there is need for an appointment. Pt aware that information would be share with Dr. Scot Dock.  Pt wife wondering about Coumadin Bridge, informed her that would be setup once the pt procedure is scheduled. Pt agreed, no questions at this time.

## 2014-07-16 NOTE — Telephone Encounter (Signed)
New Message  Juliann Pulse from [facility] was wondering if Dr. Aundra Dubin could go off pt's last OV (2/1) to clear for Surgery. Please call back and discuss.   Request for surgical clearance:  1. What type of surgery is being performed? Left CEA  2. When is this surgery scheduled? n/a   3. Are there any medications that need to be held prior to surgery and how long? n/a  4. Name of physician performing surgery? [facility]  5. What is your office phone and fax number? [phone] Pulse 6.

## 2014-07-16 NOTE — Telephone Encounter (Signed)
[name], pt's wife called wanting to speak with a nurse regarding pt's visit w/ Dr. Doren Custard. Please call [name] # [phone]

## 2014-07-16 NOTE — Progress Notes (Signed)
[facility]  Patient name: CARRSON LIGHTCAP MRN: [id number] DOB: 09/07/1931 Sex: male  REASON FOR CONSULT: asymptomatic greater than 80% left carotid stenosis. Referred by Dr.Tat.  HPI: BREVYN RING is a 81 y.o. male who was found to have a carotid bruit which prompted a duplex scan. This showed a greater than 80% left carotid stenosis with a 40-59% right carotid stenosis. He was referred for vascular consultation.  He is right-handed. He denies any history of stroke, TIAs, expressive or receptive aphasia, or amaurosis fugax. He does state that in general he is gradually noted increasing difficulty finding his words but again I do not get any clear-cut history of expressive or receptive aphasia.  He is on aspirin. He is on a statin. He is also on Coumadin because he had a mechanical aortic valve replacement over 20 years ago. He quit tobacco approximately 15 years ago.  He was evaluated by neurology because of gait instability. It was felt that this was likely multifactorial. He also has a history of cervical stenosis and has undergone previous surgery by Dr. Joya Salm.    Past Medical History  Diagnosis Date  . Heart murmur   . Complication of anesthesia 02-15-12    very confused, and very slow to awaken after anesthesia  . Coronary artery disease 02-15-12    Heart valve replaced   . Hypercholesterolemia 02-15-12  . Hypertension   . Arthritis 02-15-12    osteoarthritis,right knee, pain right wrist.,   Family History  Problem Relation Age of Onset  . Heart disease Mother   . Hyperlipidemia Mother   . Hypertension Mother   . Varicose Veins Mother   . Hyperlipidemia Father   . Hypertension Father   . Heart attack Father    SOCIAL HISTORY: History  Substance Use Topics  . Smoking status: Former Smoker    Quit date: 08/10/1995  . Smokeless tobacco: Never Used  . Alcohol Use: 0.0 oz/week    0 Standard drinks or equivalent per week     Comment:  1-2x a year   No Known Allergies Current Outpatient Prescriptions  Medication Sig Dispense Refill  . aspirin EC 81 MG tablet Take 81 mg by mouth every morning.     . AVODART 0.5 MG capsule TAKE 1 CAPSULE (0.5 MG TOTAL) BY MOUTH EVERY EVENING. 90 capsule 3  . donepezil (ARICEPT) 5 MG tablet ONE BY MOUTH DAILY 90 tablet 3  . enalapril (VASOTEC) 10 MG tablet TAKE 1 TABLET (10 MG TOTAL) BY MOUTH DAILY. 90 tablet 1  . furosemide (LASIX) 40 MG tablet 1 tablet by mouth every other day 45 tablet 1  . HYDROcodone-acetaminophen (NORCO) 10-325 MG per tablet Take 1 tablet by mouth every 8 (eight) hours as needed. Do not fill until sixty days after Rx date 120 tablet 0  . memantine (NAMENDA) 5 MG tablet Take 1 tablet (5 mg total) by mouth 2 (two) times daily. 180 tablet 3  . potassium chloride SA (K-DUR,KLOR-CON) 20 MEQ tablet 1 tablet by mouth every other day 45 tablet 1  . pravastatin (PRAVACHOL) 40 MG tablet TAKE 1 TABLET EVERY EVENING 30 tablet 5  . tamsulosin (FLOMAX) 0.4 MG CAPS capsule Take 1 capsule (0.4 mg total) by mouth daily. 90 capsule 3  . Vitamin D, Ergocalciferol, (DRISDOL) 50000 UNITS CAPS capsule TAKE 1 CAPSULE BY MOUTH DAILY FOR 1 WEEK THEN WEEKLY THEREAFTER 15 capsule 3  . warfarin (COUMADIN) 5 MG tablet TAKE AS DIRECTED 100 tablet  PRN  . metoprolol tartrate (LOPRESSOR) 25 MG tablet TAKE 1 TABLET (25 MG TOTAL) BY MOUTH 2 (TWO) TIMES DAILY. (Patient not taking: Reported on 07/16/2014) 180 tablet 3   No current facility-administered medications for this visit.   REVIEW OF SYSTEMS: Valu.Nieves ] denotes positive finding; [  ] denotes negative finding  CARDIOVASCULAR:  [ ]  chest pain   [ ]  chest pressure   [ ]  palpitations   Valu.Nieves ] orthopnea   Valu.Nieves ] dyspnea on exertion   Valu.Nieves ] claudication   Valu.Nieves ] rest pain   [ ]  DVT   [ ]  phlebitis PULMONARY:   [ ]  productive cough   [ ]  asthma   [ ]  wheezing NEUROLOGIC:   [ ]  weakness  [ ]  paresthesias  [ ]  aphasia  [ ]  amaurosis  Valu.Nieves ] dizziness HEMATOLOGIC:   [ ]   bleeding problems   [ ]  clotting disorders MUSCULOSKELETAL:  [ ]  joint pain   [ ]  joint swelling Valu.Nieves ] leg swelling GASTROINTESTINAL: [ ]   blood in stool  [ ]   hematemesis GENITOURINARY:  [ ]   dysuria  [ ]   hematuria PSYCHIATRIC:  [ ]  history of major depression INTEGUMENTARY:  [ ]  rashes  [ ]  ulcers CONSTITUTIONAL:  [ ]  fever   [ ]  chills  PHYSICAL EXAM: Filed Vitals:   07/16/14 1009 07/16/14 1033  BP: 183/62 190/55  Pulse: 58   Height: 5\' 6"  (1.676 m)   Weight: 193 lb (87.544 kg)   SpO2: 98%    Body mass index is 31.17 kg/(m^2). GENERAL: The patient is a well-nourished male, in no acute distress. The vital signs are documented above. CARDIOVASCULAR: There is a regular rate and rhythm. He has soft bilateral carotid bruits. He has mild bilateral lower extremity swelling. PULMONARY: There is good air exchange bilaterally without wheezing or rales. ABDOMEN: Soft and non-tender with normal pitched bowel sounds. It is difficult to assess for aneurysm because of his size. MUSCULOSKELETAL: There are no major deformities or cyanosis. NEUROLOGIC: No focal weakness or paresthesias are detected. SKIN: There are no ulcers or rashes noted. PSYCHIATRIC: The patient has a normal affect. His neck mobility is slightly limited given his previous neck surgery.  DATA:  I did review his carotid duplex scan that was done on 06/11/2014 which showed a 40-59% right carotid stenosis and a greater than 80% left carotid stenosis. Vertebral arteries were patent with antegrade flow.  Carotid duplex on the left at our office showed a peak systolic velocity on the left at 535 cm/s with an ICA to CCA ratio of 6.5 to which confirms the severity of the stenosis on the left.  MEDICAL ISSUES:  ASYMPTOMATIC GREATER THAN 80% LEFT CAROTID STENOSIS: This is a difficult situation in that the patient is 81 years old, does have a history of cardiac disease, and has also had previous neck surgery with slightly limited neck  mobility. I have explained that in a normal risk patient we would recommend left carotid endarterectomy in order to lower his risk of stroke. We have discussed the procedure and potential complications, including but not limited to stroke (perioperative risk 1-2%), MI, nerve injury, or bleeding. Given his age and cardiac history he may be at slightly increased risk for surgery and I have recommended preoperative cardiac evaluation. In addition, he has somewhat limited neck mobility which would make surgery more challenging. However, given that he is asymptomatic he would have to be part of a clinical trial to be  considered for carotid stenting. Regardless he is not sure whether or not he wants to have surgery. Given that he is asymptomatic and 81 years old I do not think this is totally unreasonable. We'll have him undergo preoperative cardiac evaluation and then I'll discuss this with them further before making a final decision. In the meantime he'll continue on his aspirin and statin in addition to his Coumadin.  His blood pressure was elevated today we repeated this and it was still high. His blood pressure will continue to be followed by his primary care physician.  [facility] [place] [facility]: [phone]

## 2014-07-16 NOTE — Telephone Encounter (Signed)
Pt is returning your call please call them back

## 2014-07-16 NOTE — Telephone Encounter (Signed)
Left message on machine for patient to call back.

## 2014-07-16 NOTE — Telephone Encounter (Signed)
Spoke w/Kathy at [facility]- Dr. Nicole Cella office.  States he needs a (L) CEA and wants to know if Dr. Claris Gladden last [place] 2/1 would be clearance for surgery.  Surgery has not been scheduled until has clearance.  Advised will forward to Dr. Aundra Dubin and Desiree Lucy, RN for clarification.

## 2014-07-16 NOTE — Telephone Encounter (Signed)
Would call patient, if symptoms are unchanged compared to 2/16, no further workup prior to CEA.

## 2014-07-17 NOTE — Telephone Encounter (Signed)
I do not think I would do further workup prior to CEA.  Just saw him in 2/16. Will need Lovenox bridge off warfarin.

## 2014-07-17 NOTE — Telephone Encounter (Signed)
Okay, I should probably see him after his carotid surgery at some point and we can re-address idea of possibly doing the LP, even if it is 6 months from now.

## 2014-07-17 NOTE — Telephone Encounter (Signed)
Pt advised.

## 2014-07-17 NOTE — Telephone Encounter (Signed)
LMTCB with Sharyn Lull for [place].

## 2014-07-17 NOTE — Telephone Encounter (Signed)
Spoke with patient's wife. They saw [facility] and they are planning surgery on right carotid. She states they are waiting on permission from the heart doctor and then they plan to proceed.   He was made aware of Dr Doristine Devoid message (When you call pt next Friday re: appt with neurosurgery and back, tell him that I have re-looked at MRI brain and fluid filled spaces (ventricles) are a little big and like the other things we talked about could contribute to walking issues. Could try high volume tap to see if it helps with walking (talk to him about what that means and what that test does) ).   Spoke with her about lumbar puncture and they will hold on this until after carotid surgery. They also were not interested in referral to neurosurgery for his neck at this time. They will call back as needed.

## 2014-07-18 ENCOUNTER — Telehealth: Payer: Self-pay | Admitting: Vascular Surgery

## 2014-07-18 ENCOUNTER — Ambulatory Visit (INDEPENDENT_AMBULATORY_CARE_PROVIDER_SITE_OTHER): Payer: Medicare Other | Admitting: Family Medicine

## 2014-07-18 ENCOUNTER — Encounter: Payer: Self-pay | Admitting: Family Medicine

## 2014-07-18 VITALS — BP 216/83 | HR 58 | Temp 97.5°F | Ht 66.0 in | Wt 194.4 lb

## 2014-07-18 DIAGNOSIS — I6523 Occlusion and stenosis of bilateral carotid arteries: Secondary | ICD-10-CM

## 2014-07-18 DIAGNOSIS — M159 Polyosteoarthritis, unspecified: Secondary | ICD-10-CM

## 2014-07-18 DIAGNOSIS — Z7189 Other specified counseling: Secondary | ICD-10-CM

## 2014-07-18 DIAGNOSIS — M4802 Spinal stenosis, cervical region: Secondary | ICD-10-CM

## 2014-07-18 DIAGNOSIS — I739 Peripheral vascular disease, unspecified: Secondary | ICD-10-CM

## 2014-07-18 DIAGNOSIS — G8929 Other chronic pain: Secondary | ICD-10-CM

## 2014-07-18 DIAGNOSIS — I6529 Occlusion and stenosis of unspecified carotid artery: Secondary | ICD-10-CM | POA: Diagnosis not present

## 2014-07-18 DIAGNOSIS — I10 Essential (primary) hypertension: Secondary | ICD-10-CM

## 2014-07-18 DIAGNOSIS — I779 Disorder of arteries and arterioles, unspecified: Secondary | ICD-10-CM | POA: Insufficient documentation

## 2014-07-18 MED ORDER — HYDROCHLOROTHIAZIDE 25 MG PO TABS: 25.0000 mg | ORAL_TABLET | Freq: Every day | ORAL | Status: DC

## 2014-07-18 MED ORDER — HYDROCODONE-ACETAMINOPHEN 10-325 MG PO TABS: 1.0000 | ORAL_TABLET | Freq: Four times a day (QID) | ORAL | Status: DC | PRN

## 2014-07-18 MED ORDER — FUROSEMIDE 40 MG PO TABS: 40.0000 mg | ORAL_TABLET | Freq: Every day | ORAL | Status: DC | PRN

## 2014-07-18 MED ORDER — HYDROCODONE-ACETAMINOPHEN 10-325 MG PO TABS: 1.0000 | ORAL_TABLET | Freq: Three times a day (TID) | ORAL | Status: DC | PRN

## 2014-07-18 NOTE — Patient Instructions (Signed)
[name] needs to help you remember to make sure you are taking your meds every day. I doubt that the risk of surgery either on your carotid artery or on your neck is worth risk. I added another blood pressure medication. See me in two to three weeks to recheck the blood pressure.

## 2014-07-18 NOTE — Telephone Encounter (Signed)
Spoke with pt's wife - she said they saw Dr. Sallye Ober today 07/18/14 and have decided to cxl appointment with Dr. Scot Dock for now and think about surgery due to patient's age. Will contact our office when they make a decision either way.

## 2014-07-18 NOTE — Telephone Encounter (Signed)
Tye [place] advised to notify [facility] when surgery date is scheduled and they can coordinate lovenox bridge.

## 2014-07-18 NOTE — Telephone Encounter (Signed)
Juan Li given information provided by Dr Aundra Dubin 07/17/14 2:15PM

## 2014-07-18 NOTE — Progress Notes (Signed)
   Subjective:    Patient ID: Juan Li, male    DOB: April 14, 1932, 81 y.o.   MRN: [id number]  HPI Several important issues Systolic BP quite high.  States he did not take his meds this morning.  I am concerned that he is responsible for his own meds despite dementia.  Educated wife about her helping with meds. 80% carotid stenosis found as an incidental finding on neuro work up.  Wants my opinion.   Worsening cervical spinal stenosis above and below his old surgical repair.  Again wants my opinion. Recent labs have been checked.      Review of Systems No chest pain, focal neuro deficits or SOB     Objective:   Physical Exam Elevated BP verified. Gait is not much different than my previous exam.  Yes he is unsteady, but he has not had a major decline. Lungs clear Cardiac RRR with 2/^ SEM Abd benign.        Assessment & Plan:  Long discussion that surgery increases short term risk aiming for the possibility of long term benefit. Given his mild dementia, his best years are right now.

## 2014-07-18 NOTE — Assessment & Plan Note (Signed)
refill 

## 2014-07-18 NOTE — Assessment & Plan Note (Signed)
Again, high risk for surgery and dementia would complicate his recovery.  I defer to neuro opinion and lean toward careful observation.

## 2014-07-18 NOTE — Telephone Encounter (Signed)
BUSY 

## 2014-07-18 NOTE — Assessment & Plan Note (Signed)
Given that he is asymptomatic and a surgical high risk, I do not favor carotid endarterectomy at this time.

## 2014-07-18 NOTE — Telephone Encounter (Signed)
LMTCB

## 2014-07-18 NOTE — Assessment & Plan Note (Addendum)
Add HCTZ.  He only takes furosemide a couple of times per month.  Recheck in two weeks. Enlist wife's help in assuring compliance.

## 2014-07-19 ENCOUNTER — Emergency Department (HOSPITAL_COMMUNITY): Payer: Medicare Other

## 2014-07-19 ENCOUNTER — Emergency Department (HOSPITAL_COMMUNITY)
Admission: EM | Admit: 2014-07-19 | Discharge: 2014-07-19 | Disposition: A | Discharge status: Home / Self Care | Payer: Medicare Other | Attending: Emergency Medicine | Admitting: Emergency Medicine

## 2014-07-19 ENCOUNTER — Encounter (HOSPITAL_COMMUNITY): Payer: Self-pay | Admitting: Emergency Medicine

## 2014-07-19 DIAGNOSIS — M159 Polyosteoarthritis, unspecified: Secondary | ICD-10-CM | POA: Diagnosis not present

## 2014-07-19 DIAGNOSIS — Z87891 Personal history of nicotine dependence: Secondary | ICD-10-CM | POA: Insufficient documentation

## 2014-07-19 DIAGNOSIS — S63502A Unspecified sprain of left wrist, initial encounter: Secondary | ICD-10-CM | POA: Diagnosis not present

## 2014-07-19 DIAGNOSIS — I10 Essential (primary) hypertension: Secondary | ICD-10-CM | POA: Insufficient documentation

## 2014-07-19 DIAGNOSIS — Z951 Presence of aortocoronary bypass graft: Secondary | ICD-10-CM | POA: Insufficient documentation

## 2014-07-19 DIAGNOSIS — S6991XA Unspecified injury of right wrist, hand and finger(s), initial encounter: Secondary | ICD-10-CM | POA: Diagnosis present

## 2014-07-19 DIAGNOSIS — Z7901 Long term (current) use of anticoagulants: Secondary | ICD-10-CM | POA: Diagnosis not present

## 2014-07-19 DIAGNOSIS — Y998 Other external cause status: Secondary | ICD-10-CM | POA: Insufficient documentation

## 2014-07-19 DIAGNOSIS — S51811A Laceration without foreign body of right forearm, initial encounter: Secondary | ICD-10-CM | POA: Diagnosis not present

## 2014-07-19 DIAGNOSIS — W19XXXA Unspecified fall, initial encounter: Secondary | ICD-10-CM

## 2014-07-19 DIAGNOSIS — E780 Pure hypercholesterolemia: Secondary | ICD-10-CM | POA: Diagnosis not present

## 2014-07-19 DIAGNOSIS — S63501A Unspecified sprain of right wrist, initial encounter: Secondary | ICD-10-CM | POA: Insufficient documentation

## 2014-07-19 DIAGNOSIS — Z954 Presence of other heart-valve replacement: Secondary | ICD-10-CM | POA: Diagnosis not present

## 2014-07-19 DIAGNOSIS — S0031XA Abrasion of nose, initial encounter: Secondary | ICD-10-CM | POA: Insufficient documentation

## 2014-07-19 DIAGNOSIS — I2510 Atherosclerotic heart disease of native coronary artery without angina pectoris: Secondary | ICD-10-CM | POA: Insufficient documentation

## 2014-07-19 DIAGNOSIS — R011 Cardiac murmur, unspecified: Secondary | ICD-10-CM | POA: Diagnosis not present

## 2014-07-19 DIAGNOSIS — S4992XA Unspecified injury of left shoulder and upper arm, initial encounter: Secondary | ICD-10-CM | POA: Diagnosis not present

## 2014-07-19 DIAGNOSIS — Y9389 Activity, other specified: Secondary | ICD-10-CM | POA: Insufficient documentation

## 2014-07-19 DIAGNOSIS — Z7982 Long term (current) use of aspirin: Secondary | ICD-10-CM | POA: Diagnosis not present

## 2014-07-19 DIAGNOSIS — Y9289 Other specified places as the place of occurrence of the external cause: Secondary | ICD-10-CM | POA: Diagnosis not present

## 2014-07-19 DIAGNOSIS — S51801A Unspecified open wound of right forearm, initial encounter: Secondary | ICD-10-CM | POA: Diagnosis not present

## 2014-07-19 DIAGNOSIS — Z79899 Other long term (current) drug therapy: Secondary | ICD-10-CM | POA: Diagnosis not present

## 2014-07-19 DIAGNOSIS — W010XXA Fall on same level from slipping, tripping and stumbling without subsequent striking against object, initial encounter: Secondary | ICD-10-CM | POA: Insufficient documentation

## 2014-07-19 DIAGNOSIS — S59911A Unspecified injury of right forearm, initial encounter: Secondary | ICD-10-CM | POA: Diagnosis not present

## 2014-07-19 NOTE — Discharge Instructions (Signed)
Skin Tear Care A skin tear is a wound in which the top layer of skin has peeled off. This is a common problem with aging because the skin becomes thinner and more fragile as a person gets older. In addition, some medicines, such as oral corticosteroids, can lead to skin thinning if taken for long periods of time.  A skin tear is often repaired with tape or skin adhesive strips. This keeps the skin that has been peeled off in contact with the healthier skin beneath. Depending on the location of the wound, a bandage (dressing) may be applied over the tape or skin adhesive strips. Sometimes, during the healing process, the skin turns black and dies. Even when this happens, the torn skin acts as a good dressing until the skin underneath gets healthier and repairs itself. HOME CARE INSTRUCTIONS   Change dressings once per day or as directed by your caregiver.  Gently clean the skin tear and the area around the tear using saline solution or mild soap and water.  Do not rub the injured skin dry. Let the area air dry.  Apply petroleum jelly or an antibiotic cream or ointment to keep the tear moist. This will help the wound heal. Do not allow a scab to form.  If the dressing sticks before the next dressing change, moisten it with warm soapy water and gently remove it.  Protect the injured skin until it has healed.  Only take over-the-counter or prescription medicines as directed by your caregiver.  Take showers or baths using warm soapy water. Apply a new dressing after the shower or bath.  Keep all follow-up appointments as directed by your caregiver.  SEEK IMMEDIATE MEDICAL CARE IF:   You have redness, swelling, or increasing pain in the skin tear.  You havepus coming from the skin tear.  You have chills.  You have a red streak that goes away from the skin tear.  You have a bad smell coming from the tear or dressing.  You have a fever or persistent symptoms for more than 2-3 days.  You  have a fever and your symptoms suddenly get worse. MAKE SURE YOU:  Understand these instructions.  Will watch this condition.  Will get help right away if your child is not doing well or gets worse. Document Released: 01/11/2001 Document Revised: 01/11/2012 Document Reviewed: 10/31/2011 [facility] Patient Information 2015 [place], [place]. This information is not intended to replace advice given to you by your health care provider. Make sure you discuss any questions you have with your health care provider. Wrist Sprain with Rehab A sprain is an injury in which a ligament that maintains the proper alignment of a joint is partially or completely torn. The ligaments of the wrist are susceptible to sprains. Sprains are classified into three categories. Grade 1 sprains cause pain, but the tendon is not lengthened. Grade 2 sprains include a lengthened ligament because the ligament is stretched or partially ruptured. With grade 2 sprains there is still function, although the function may be diminished. Grade 3 sprains are characterized by a complete tear of the tendon or muscle, and function is usually impaired. SYMPTOMS   Pain tenderness, inflammation, and/or bruising (contusion) of the injury.  A "pop" or tear felt and/or heard at the time of injury.  Decreased wrist function. CAUSES  A wrist sprain occurs when a force is placed on one or more ligaments that is greater than it/they can withstand. Common mechanisms of injury include:  Catching a ball  with you hands.  Repetitive and/ or strenuous extension or flexion of the wrist. RISK INCREASES WITH:  Previous wrist injury.  Contact sports (boxing or wrestling).  Activities in which falling is common.  Poor strength and flexibility.  Improperly fitted or padded protective equipment. PREVENTION  Warm up and stretch properly before activity.  Allow for adequate recovery between workouts.  Maintain physical fitness:  Strength,  flexibility, and endurance.  Cardiovascular fitness.  Protect the wrist joint by limiting its motion with the use of taping, braces, or splints.  Protect the wrist after injury for 6 to 12 months. PROGNOSIS  The prognosis for wrist sprains depends on the degree of injury. Grade 1 sprains require 2 to 6 weeks of treatment. Grade 2 sprains require 6 to 8 weeks of treatment, and grade 3 sprains require up to 12 weeks.  RELATED COMPLICATIONS   Prolonged healing time, if improperly treated or re-injured.  Recurrent symptoms that result in a chronic problem.  Injury to nearby structures (bone, cartilage, nerves, or tendons).  Arthritis of the wrist.  Inability to compete in athletics at a high level.  Wrist stiffness or weakness.  Progression to a complete rupture of the ligament. TREATMENT  Treatment initially involves resting from any activities that aggravate the symptoms, and the use of ice and medications to help reduce pain and inflammation. Your caregiver may recommend immobilizing the wrist for a period of time in order to reduce stress on the ligament and allow for healing. After immobilization it is important to perform strengthening and stretching exercises to help regain strength and a full range of motion. These exercises may be completed at home or with a therapist. Surgery is not usually required for wrist sprains, unless the ligament has been ruptured (grade 3 sprain). MEDICATION   If pain medication is necessary, then nonsteroidal anti-inflammatory medications, such as aspirin and ibuprofen, or other minor pain relievers, such as acetaminophen, are often recommended.  Do not take pain medication for 7 days before surgery.  Prescription pain relievers may be given if deemed necessary by your caregiver. Use only as directed and only as much as you need. HEAT AND COLD  Cold treatment (icing) relieves pain and reduces inflammation. Cold treatment should be applied for 10 to  15 minutes every 2 to 3 hours for inflammation and pain and immediately after any activity that aggravates your symptoms. Use ice packs or massage the area with a piece of ice (ice massage).  Heat treatment may be used prior to performing the stretching and strengthening activities prescribed by your caregiver, physical therapist, or athletic trainer. Use a heat pack or soak your injury in warm water. SEEK MEDICAL CARE IF:  Treatment seems to offer no benefit, or the condition worsens.  Any medications produce adverse side effects. EXERCISES RANGE OF MOTION (ROM) AND STRETCHING EXERCISES - Wrist Sprain  These exercises may help you when beginning to rehabilitate your injury. Your symptoms may resolve with or without further involvement from your physician, physical therapist or athletic trainer. While completing these exercises, remember:   Restoring tissue flexibility helps normal motion to return to the joints. This allows healthier, less painful movement and activity.  An effective stretch should be held for at least 30 seconds.  A stretch should never be painful. You should only feel a gentle lengthening or release in the stretched tissue. RANGE OF MOTION - Wrist Flexion, Active-Assisted  Extend your right / left elbow with your fingers pointing down.*  Gently pull the  back of your hand towards you until you feel a gentle stretch on the top of your forearm.  Hold this position for __________ seconds. Repeat __________ times. Complete this exercise __________ times per day.  *If directed by your physician, physical therapist or athletic trainer, complete this stretch with your elbow bent rather than extended. RANGE OF MOTION - Wrist Extension, Active-Assisted  Extend your right / left elbow and turn your palm upwards.*  Gently pull your palm/fingertips back so your wrist extends and your fingers point more toward the ground.  You should feel a gentle stretch on the inside of your  forearm.  Hold this position for __________ seconds. Repeat __________ times. Complete this exercise __________ times per day. *If directed by your physician, physical therapist or athletic trainer, complete this stretch with your elbow bent, rather than extended. RANGE OF MOTION - Supination, Active  Stand or sit with your elbows at your side. Bend your right / left elbow to 90 degrees.  Turn your palm upward until you feel a gentle stretch on the inside of your forearm.  Hold this position for __________ seconds. Slowly release and return to the starting position. Repeat __________ times. Complete this stretch __________ times per day.  RANGE OF MOTION - Pronation, Active  Stand or sit with your elbows at your side. Bend your right / left elbow to 90 degrees.  Turn your palm downward until you feel a gentle stretch on the top of your forearm.  Hold this position for __________ seconds. Slowly release and return to the starting position. Repeat __________ times. Complete this stretch __________ times per day.  STRETCH - Wrist Flexion  Place the back of your right / left hand on a tabletop leaving your elbow slightly bent. Your fingers should point away from your body.  Gently press the back of your hand down onto the table by straightening your elbow. You should feel a stretch on the top of your forearm.  Hold this position for __________ seconds. Repeat __________ times. Complete this stretch __________ times per day.  STRETCH - Wrist Extension  Place your right / left fingertips on a tabletop leaving your elbow slightly bent. Your fingers should point backwards.  Gently press your fingers and palm down onto the table by straightening your elbow. You should feel a stretch on the inside of your forearm.  Hold this position for __________ seconds. Repeat __________ times. Complete this stretch __________ times per day.  STRENGTHENING EXERCISES - Wrist Sprain These exercises may  help you when beginning to rehabilitate your injury. They may resolve your symptoms with or without further involvement from your physician, physical therapist or athletic trainer. While completing these exercises, remember:   Muscles can gain both the endurance and the strength needed for everyday activities through controlled exercises.  Complete these exercises as instructed by your physician, physical therapist or athletic trainer. Progress with the resistance and repetition exercises only as your caregiver advises. STRENGTH - Wrist Flexors  Sit with your right / left forearm palm-up and fully supported. Your elbow should be resting below the height of your shoulder. Allow your wrist to extend over the edge of the surface.  Loosely holding a __________ weight or a piece of rubber exercise band/tubing, slowly curl your hand up toward your forearm.  Hold this position for __________ seconds. Slowly lower the wrist back to the starting position in a controlled manner. Repeat __________ times. Complete this exercise __________ times per day.  STRENGTH - Wrist  Extensors  Sit with your right / left forearm palm-down and fully supported. Your elbow should be resting below the height of your shoulder. Allow your wrist to extend over the edge of the surface.  Loosely holding a __________ weight or a piece of rubber exercise band/tubing, slowly curl your hand up toward your forearm.  Hold this position for __________ seconds. Slowly lower the wrist back to the starting position in a controlled manner. Repeat __________ times. Complete this exercise __________ times per day.  STRENGTH - Ulnar Deviators  Stand with a ____________________ weight in your right / left hand, or sit holding on to the rubber exercise band/tubing with your opposite arm supported.  Move your wrist so that your pinkie travels toward your forearm and your thumb moves away from your forearm.  Hold this position for  __________ seconds and then slowly lower the wrist back to the starting position. Repeat __________ times. Complete this exercise __________ times per day STRENGTH - Radial Deviators  Stand with a ____________________ weight in your  right / left hand, or sit holding on to the rubber exercise band/tubing with your arm supported.  Raise your hand upward in front of you or pull up on the rubber tubing.  Hold this position for __________ seconds and then slowly lower the wrist back to the starting position. Repeat __________ times. Complete this exercise __________ times per day. STRENGTH - Forearm Supinators  Sit with your right / left forearm supported on a table, keeping your elbow below shoulder height. Rest your hand over the edge, palm down.  Gently grip a hammer or a soup ladle.  Without moving your elbow, slowly turn your palm and hand upward to a "thumbs-up" position.  Hold this position for __________ seconds. Slowly return to the starting position. Repeat __________ times. Complete this exercise __________ times per day.  STRENGTH - Forearm Pronators  Sit with your right / left forearm supported on a table, keeping your elbow below shoulder height. Rest your hand over the edge, palm up.  Gently grip a hammer or a soup ladle.  Without moving your elbow, slowly turn your palm and hand upward to a "thumbs-up" position.  Hold this position for __________ seconds. Slowly return to the starting position. Repeat __________ times. Complete this exercise __________ times per day.  STRENGTH - Grip  Grasp a tennis ball, a dense sponge, or a large, rolled sock in your hand.  Squeeze as hard as you can without increasing any pain.  Hold this position for __________ seconds. Release your grip slowly. Repeat __________ times. Complete this exercise __________ times per day.  Document Released: 04/18/2005 Document Revised: 07/11/2011 Document Reviewed: 07/31/2008 [facility] Patient  Information 2015 [place], [place]. This information is not intended to replace advice given to you by your health care provider. Make sure you discuss any questions you have with your health care provider.

## 2014-07-19 NOTE — ED Notes (Addendum)
Pt tripped over rug at [place] and fell around 2:30pm yesterday.  Denies LOC.  C/o skin tears to R forearm, R wrist pain, and L shoulder pain. Abrasion noted to nose and L forehead.

## 2014-07-19 NOTE — ED Provider Notes (Signed)
CSN: [id number]     Arrival date & time 07/19/14  0416 History   First MD Initiated Contact with Patient 07/19/14 [phone]     Chief Complaint  Patient presents with  . Fall  . Wrist Pain  . Shoulder Pain     (Consider location/radiation/quality/duration/timing/severity/associated sxs/prior Treatment) HPI He fell yesterday after tripping over a carpet edge. At that time he did not feel that he needed to be seen in the emergency department. He did not have loss of consciousness. He reports however overnight his right wrist and forearm became more painful. He also has some pain in his left shoulder. He denies any chest pain, shortness of breath, abdominal pain or extremity dysfunction. Past Medical History  Diagnosis Date  . Heart murmur   . Complication of anesthesia 02-15-12    very confused, and very slow to awaken after anesthesia  . Coronary artery disease 02-15-12    Heart valve replaced   . Hypercholesterolemia 02-15-12  . Hypertension   . Arthritis 02-15-12    osteoarthritis,right knee, pain right wrist.,   Past Surgical History  Procedure Laterality Date  . Neck surgery    . Cardiac valve replacement    . Shoulder open rotator cuff repair  02-15-12    left  . Total knee arthroplasty  02/20/2012    Procedure: TOTAL KNEE ARTHROPLASTY;  Surgeon: Gearlean Alf, MD;  Location: [facility];  Service: Orthopedics;  Laterality: Right;  . Coronary artery bypass graft    . Joint replacement     Family History  Problem Relation Age of Onset  . Heart disease Mother   . Hyperlipidemia Mother   . Hypertension Mother   . Varicose Veins Mother   . Hyperlipidemia Father   . Hypertension Father   . Heart attack Father    History  Substance Use Topics  . Smoking status: Former Smoker    Quit date: 08/10/1995  . Smokeless tobacco: Never Used  . Alcohol Use: 0.0 oz/week    0 Standard drinks or equivalent per week     Comment: 1-2x a year    Review of Systems 10 Systems reviewed  and are negative for acute change except as noted in the HPI.    Allergies  Review of patient's allergies indicates no known allergies.  Home Medications   Prior to Admission medications   Medication Sig Start Date End Date Taking? Authorizing Provider  aspirin EC 81 MG tablet Take 81 mg by mouth every morning.  04/20/11  Yes Larey Dresser, MD  AVODART 0.5 MG capsule TAKE 1 CAPSULE (0.5 MG TOTAL) BY MOUTH EVERY EVENING. 01/10/14  Yes Zenia Resides, MD  donepezil (ARICEPT) 5 MG tablet ONE BY MOUTH DAILY 04/09/14  Yes Zenia Resides, MD  enalapril (VASOTEC) 10 MG tablet TAKE 1 TABLET (10 MG TOTAL) BY MOUTH DAILY. 04/17/14  Yes Larey Dresser, MD  furosemide (LASIX) 40 MG tablet Take 1 tablet (40 mg total) by mouth daily as needed for fluid. 1 tablet by mouth every other day 07/18/14  Yes Zenia Resides, MD  hydrochlorothiazide (HYDRODIURIL) 25 MG tablet Take 1 tablet (25 mg total) by mouth daily. 07/18/14  Yes Zenia Resides, MD  HYDROcodone-acetaminophen (NORCO) 10-325 MG per tablet Take 1 tablet by mouth every 8 (eight) hours as needed. Do not fill until sixty days after Rx date Patient taking differently: Take 1 tablet by mouth every 8 (eight) hours as needed for moderate pain. Do not fill until sixty  days after Rx date 07/18/14  Yes Zenia Resides, MD  memantine (NAMENDA) 5 MG tablet Take 1 tablet (5 mg total) by mouth 2 (two) times daily. 05/12/14  Yes Zenia Resides, MD  metoprolol tartrate (LOPRESSOR) 25 MG tablet TAKE 1 TABLET (25 MG TOTAL) BY MOUTH 2 (TWO) TIMES DAILY. Patient taking differently: Take 25 mg by mouth 2 (two) times daily.  10/21/13  Yes Larey Dresser, MD  potassium chloride SA (K-DUR,KLOR-CON) 20 MEQ tablet 1 tablet by mouth every other day Patient taking differently: Take 20 mEq by mouth daily as needed (when taking lasix).  06/02/14  Yes Larey Dresser, MD  pravastatin (PRAVACHOL) 40 MG tablet TAKE 1 TABLET EVERY EVENING 04/05/14  Yes Larey Dresser, MD   tamsulosin (FLOMAX) 0.4 MG CAPS capsule Take 1 capsule (0.4 mg total) by mouth daily. 10/21/13  Yes Larey Dresser, MD  Vitamin D, Ergocalciferol, (DRISDOL) 50000 UNITS CAPS capsule TAKE 1 CAPSULE BY MOUTH DAILY FOR 1 WEEK THEN WEEKLY THEREAFTER 03/17/14  Yes Zenia Resides, MD  warfarin (COUMADIN) 5 MG tablet TAKE AS DIRECTED Patient taking differently: Take 5 mg by mouth every evening.  05/12/14  Yes Zenia Resides, MD  HYDROcodone-acetaminophen (NORCO) 10-325 MG per tablet Take 1 tablet by mouth every 6 (six) hours as needed. Patient not taking: Reported on 07/19/2014 07/18/14   Zenia Resides, MD  HYDROcodone-acetaminophen [facility]) 10-325 MG per tablet Take 1 tablet by mouth every 6 (six) hours as needed. Patient not taking: Reported on 07/19/2014 07/18/14   Zenia Resides, MD   BP 106/62 mmHg  Pulse 53  Temp(Src) 98.2 F (36.8 C) (Oral)  Resp 18  Ht 5\' 6"  (1.676 m)  Wt 190 lb (86.183 kg)  BMI 30.68 kg/m2  SpO2 100% Physical Exam  Constitutional: He is oriented to person, place, and time. He appears well-developed and well-nourished.  HENT:  Right Ear: External ear normal.  Left Ear: External ear normal.  Nose: Nose normal.  Mouth/Throat: No oropharyngeal exudate.  Superficial abrasion, petechial to the nasal bridge.  Eyes: EOM are normal. Pupils are equal, round, and reactive to light.  Neck: Neck supple.  Cardiovascular: Normal rate, regular rhythm, normal heart sounds and intact distal pulses.   Pulmonary/Chest: Effort normal and breath sounds normal.  Abdominal: Soft. Bowel sounds are normal. He exhibits no distension. There is no tenderness.  Musculoskeletal: Normal range of motion. He exhibits edema and tenderness.  Right forearm has 2 skin tears. One is over the dorsum of the hand and the wrist approximately 6 cm in length. The other is over the forearm slightly irregular and approximately 5 cm in length. Both of these have been cleaned and dressed with a clear  occlusive dressing. The skin underneath is well approximated. Patient endorses tenderness to compression over the carpal bones of the wrist. There is not any deformity present. He does have range of motion hand. The pulses are 2+. Good range of motion of the elbow without effusion and good range of motion at the shoulder without difficulty. Lower extremities do not show any acute injury and patient has intact range of motion. He has 1+ edema symmetrically of the lower extremities.  Neurological: He is alert and oriented to person, place, and time. He has normal strength. Coordination normal. GCS eye subscore is 4. GCS verbal subscore is 5. GCS motor subscore is 6.  Skin: Skin is warm, dry and intact.  Psychiatric: He has a normal mood and affect.  ED Course  Procedures (including critical care time) Labs Review Labs Reviewed - No data to display  Imaging Review Dg Forearm Right  07/19/2014   CLINICAL DATA:  [place] last night onto concrete  EXAM: RIGHT FOREARM - 2 VIEW  COMPARISON:  None.  FINDINGS: Negative for fracture, dislocation or radiopaque foreign body. Old posttraumatic and degenerative changes are present about the wrist and elbow.  IMPRESSION: Negative for acute fracture   Electronically Signed   By: Andreas Newport M.D.   On: 07/19/2014 05:34   Dg Shoulder Left  07/19/2014   CLINICAL DATA:  [place] last night onto concrete  EXAM: LEFT SHOULDER - 2+ VIEW  COMPARISON:  10/20/2012  FINDINGS: Negative for acute fracture or dislocation. Mild AC degenerative changes are present. No bone lesion or bony destruction is evident.  IMPRESSION: Negative for acute fracture   Electronically Signed   By: Andreas Newport M.D.   On: 07/19/2014 05:35     EKG Interpretation None      MDM   Final diagnoses:  Fall, initial encounter  Skin tear of forearm without complication, right, initial encounter  Wrist sprain, right, initial encounter   Patient has well appearance. Injury was yesterday. At  this point no acute fractures identified. Continued outpatient management.    Charlesetta Shanks, MD 07/19/14 [phone]

## 2014-07-21 ENCOUNTER — Telehealth: Payer: Self-pay | Admitting: *Deleted

## 2014-07-21 DIAGNOSIS — N400 Enlarged prostate without lower urinary tract symptoms: Secondary | ICD-10-CM

## 2014-07-21 NOTE — Telephone Encounter (Signed)
Received fax from pharmacy that avodart is no longer available. Would like MD to consider prescribing another medication for patient.

## 2014-07-22 MED ORDER — FINASTERIDE 5 MG PO TABS: 5.0000 mg | ORAL_TABLET | Freq: Every day | ORAL | Status: DC

## 2014-07-22 NOTE — Assessment & Plan Note (Signed)
Switch to proscar generic

## 2014-07-30 ENCOUNTER — Ambulatory Visit: Payer: Medicare Other | Admitting: Vascular Surgery

## 2014-08-01 ENCOUNTER — Ambulatory Visit (INDEPENDENT_AMBULATORY_CARE_PROVIDER_SITE_OTHER): Payer: Medicare Other | Admitting: Family Medicine

## 2014-08-01 ENCOUNTER — Encounter: Payer: Self-pay | Admitting: Family Medicine

## 2014-08-01 VITALS — BP 108/40 | HR 85 | Temp 98.3°F | Ht 66.0 in | Wt 191.8 lb

## 2014-08-01 DIAGNOSIS — I6523 Occlusion and stenosis of bilateral carotid arteries: Secondary | ICD-10-CM

## 2014-08-01 DIAGNOSIS — I10 Essential (primary) hypertension: Secondary | ICD-10-CM | POA: Diagnosis not present

## 2014-08-01 MED ORDER — HYDROCHLOROTHIAZIDE 25 MG PO TABS: 12.5000 mg | ORAL_TABLET | Freq: Every day | ORAL | Status: DC

## 2014-08-01 MED ORDER — METOPROLOL TARTRATE 25 MG PO TABS: 12.5000 mg | ORAL_TABLET | Freq: Two times a day (BID) | ORAL | Status: DC

## 2014-08-01 NOTE — Patient Instructions (Signed)
Throw away the furosemide.  I don't want him on it. I think now that he is regularly taking all his medications, he is on too much blood pressure medicine, which is causing him to be dizzy/lightheaded. Stop the potassium, but don't throw it away. Two other changes: 1. Cut the HCTZ in half and only take a half tablet once a day. 2. Cut the metoprolol in half and take a half tablet twice a day.  See me in one month to see if I need to make any other change.

## 2014-08-01 NOTE — Progress Notes (Signed)
   Subjective:    Patient ID: Juan Li, male    DOB: 08/06/1931, 81 y.o.   MRN: [id number]  HPI Follow up for hypertension.  The pendulum has swung too far.  BP is low today and he is frequently lightheaded.  I suspect that he was significantly non compliant.  Now that his wife is administering medications, he is getting regularly and likely overmedicated.  Feels fine other than lightheadedness.  Did have one fall.  Abrasion to right arm.  Seen in ER.  No sig injuries.    Review of Systems     Objective:   Physical ExamOrthostatics done by me Lungs clear Cardiac RRR without m or g        Assessment & Plan:

## 2014-08-01 NOTE — Assessment & Plan Note (Signed)
Overmedicated.  Half dose of HCTZ and metoprolol.  Wife to continue to administer meds.

## 2014-08-05 ENCOUNTER — Ambulatory Visit (INDEPENDENT_AMBULATORY_CARE_PROVIDER_SITE_OTHER): Payer: Medicare Other | Admitting: Pharmacist Clinician (PhC)/ Clinical Pharmacy Specialist

## 2014-08-05 DIAGNOSIS — I350 Nonrheumatic aortic (valve) stenosis: Secondary | ICD-10-CM | POA: Diagnosis not present

## 2014-08-05 DIAGNOSIS — I359 Nonrheumatic aortic valve disorder, unspecified: Secondary | ICD-10-CM | POA: Diagnosis not present

## 2014-08-05 DIAGNOSIS — Z954 Presence of other heart-valve replacement: Secondary | ICD-10-CM | POA: Diagnosis not present

## 2014-08-05 DIAGNOSIS — Z952 Presence of prosthetic heart valve: Secondary | ICD-10-CM

## 2014-08-05 LAB — POCT INR: INR: 3.7

## 2014-08-19 ENCOUNTER — Other Ambulatory Visit: Payer: Self-pay | Admitting: Family Medicine

## 2014-08-19 ENCOUNTER — Ambulatory Visit (INDEPENDENT_AMBULATORY_CARE_PROVIDER_SITE_OTHER): Payer: Medicare Other | Admitting: *Deleted

## 2014-08-19 DIAGNOSIS — Z954 Presence of other heart-valve replacement: Secondary | ICD-10-CM

## 2014-08-19 DIAGNOSIS — I359 Nonrheumatic aortic valve disorder, unspecified: Secondary | ICD-10-CM | POA: Diagnosis not present

## 2014-08-19 DIAGNOSIS — I350 Nonrheumatic aortic (valve) stenosis: Secondary | ICD-10-CM | POA: Diagnosis not present

## 2014-08-19 DIAGNOSIS — Z952 Presence of prosthetic heart valve: Secondary | ICD-10-CM

## 2014-08-19 LAB — POCT INR: INR: 3

## 2014-08-20 ENCOUNTER — Telehealth: Payer: Self-pay | Admitting: Neurology

## 2014-08-20 NOTE — Telephone Encounter (Signed)
Left message on machine for patient to call back. To see if he has had carotid surgery and if so needs to make follow up appt with Dr Tat. Awaiting call back.

## 2014-09-10 ENCOUNTER — Ambulatory Visit: Payer: Medicare Other | Admitting: Family Medicine

## 2014-09-16 ENCOUNTER — Ambulatory Visit (INDEPENDENT_AMBULATORY_CARE_PROVIDER_SITE_OTHER): Payer: Medicare Other | Admitting: Surgery

## 2014-09-16 DIAGNOSIS — Z954 Presence of other heart-valve replacement: Secondary | ICD-10-CM

## 2014-09-16 DIAGNOSIS — I359 Nonrheumatic aortic valve disorder, unspecified: Secondary | ICD-10-CM | POA: Diagnosis not present

## 2014-09-16 DIAGNOSIS — I350 Nonrheumatic aortic (valve) stenosis: Secondary | ICD-10-CM

## 2014-09-16 DIAGNOSIS — Z952 Presence of prosthetic heart valve: Secondary | ICD-10-CM

## 2014-09-16 LAB — POCT INR: INR: 2.8

## 2014-09-17 ENCOUNTER — Ambulatory Visit (INDEPENDENT_AMBULATORY_CARE_PROVIDER_SITE_OTHER): Payer: Medicare Other | Admitting: Family Medicine

## 2014-09-17 ENCOUNTER — Encounter: Payer: Self-pay | Admitting: Family Medicine

## 2014-09-17 VITALS — BP 138/52 | Temp 98.0°F | Ht 66.0 in | Wt 194.5 lb

## 2014-09-17 DIAGNOSIS — I10 Essential (primary) hypertension: Secondary | ICD-10-CM | POA: Diagnosis not present

## 2014-09-17 DIAGNOSIS — I6523 Occlusion and stenosis of bilateral carotid arteries: Secondary | ICD-10-CM | POA: Diagnosis not present

## 2014-09-17 NOTE — Patient Instructions (Signed)
Your blood pressure is better.  Stay on your same medications Try Eucerin cream over the counter for your arms and head.   See me in three months if things are going well.  Sooner if things are not going well.

## 2014-09-18 NOTE — Assessment & Plan Note (Signed)
At goal on lower dose meds.

## 2014-09-18 NOTE — Progress Notes (Signed)
   Subjective:    Patient ID: Juan Li, male    DOB: 06-30-1931, 81 y.o.   MRN: [id number]  HPI Recheck hypertension.  See last visit note for concern of overmedication of hypertension.  He has been doing well in that he is less light headed.  He has had one fall without injury since last visit. (his falls have always been multifactorial)  His dementia is stable.    Review of Systems     Objective:   Physical Exam  Othostatic BP done.  Note large pulse pressure and low diastolic Lungs clear Cardiac 1/6 SEM        Assessment & Plan:

## 2014-09-26 ENCOUNTER — Telehealth: Payer: Self-pay | Admitting: *Deleted

## 2014-09-26 NOTE — Telephone Encounter (Signed)
I was working through our pending file today - I called Juan Li and [facility] regarding Left CEA that was cancelled in April per wife. She said that they were still discussing whether or not Juan Li was physically able to have this surgery. He evidently is still seeing Dr. Andria Frames for BP management. Will await phone call from wife.

## 2014-10-10 ENCOUNTER — Other Ambulatory Visit: Payer: Self-pay | Admitting: Family Medicine

## 2014-10-13 ENCOUNTER — Ambulatory Visit: Payer: Medicare Other | Admitting: Cardiology

## 2014-10-28 ENCOUNTER — Ambulatory Visit (INDEPENDENT_AMBULATORY_CARE_PROVIDER_SITE_OTHER): Payer: Medicare Other | Admitting: *Deleted

## 2014-10-28 DIAGNOSIS — Z954 Presence of other heart-valve replacement: Secondary | ICD-10-CM | POA: Diagnosis not present

## 2014-10-28 DIAGNOSIS — Z952 Presence of prosthetic heart valve: Secondary | ICD-10-CM

## 2014-10-28 DIAGNOSIS — I359 Nonrheumatic aortic valve disorder, unspecified: Secondary | ICD-10-CM

## 2014-10-28 DIAGNOSIS — I350 Nonrheumatic aortic (valve) stenosis: Secondary | ICD-10-CM

## 2014-10-28 LAB — POCT INR: INR: 8

## 2014-11-04 ENCOUNTER — Telehealth: Payer: Self-pay

## 2014-11-04 ENCOUNTER — Ambulatory Visit (INDEPENDENT_AMBULATORY_CARE_PROVIDER_SITE_OTHER): Payer: Medicare Other | Admitting: Pharmacist Clinician (PhC)/ Clinical Pharmacy Specialist

## 2014-11-04 DIAGNOSIS — Z954 Presence of other heart-valve replacement: Secondary | ICD-10-CM | POA: Diagnosis not present

## 2014-11-04 DIAGNOSIS — Z952 Presence of prosthetic heart valve: Secondary | ICD-10-CM

## 2014-11-04 DIAGNOSIS — I359 Nonrheumatic aortic valve disorder, unspecified: Secondary | ICD-10-CM

## 2014-11-04 DIAGNOSIS — I350 Nonrheumatic aortic (valve) stenosis: Secondary | ICD-10-CM | POA: Diagnosis not present

## 2014-11-04 LAB — PROTIME-INR
INR: 5.6 ratio — AB (ref 0.8–1.0)
Prothrombin Time: 59.6 s (ref 9.6–13.1)

## 2014-11-04 LAB — POCT INR: INR: 6.1

## 2014-11-04 NOTE — Telephone Encounter (Signed)
Received a call from [facility] in lab calling to report INR 5.63.Message sent to coumadin clinic.

## 2014-11-04 NOTE — Telephone Encounter (Signed)
STAT labs results received from lab, Pharmacist-Kristen spoke with patient and gave dosing instructions. Please refer to Anticoagulation Encounter.

## 2014-11-05 ENCOUNTER — Telehealth: Payer: Self-pay | Admitting: Cardiology

## 2014-11-05 NOTE — Telephone Encounter (Signed)
Pt's wife called wanting to be given instructions again regarding his coumadin dosing of yesterday.Instructed that he was to hold coumadin July 5th and No coumadin Jluy 6th and then reduce dose to 5mg  daily except 2.5mg  on Sunday Tuesday and Thursday and has appt to be seen in coumadin clinic on July 12th at 2:15pm and she states understanding.

## 2014-11-05 NOTE — Telephone Encounter (Signed)
New Prob    Pts wife has some questions regarding most recent VM left from Coumadin. Please call.

## 2014-11-11 ENCOUNTER — Ambulatory Visit (INDEPENDENT_AMBULATORY_CARE_PROVIDER_SITE_OTHER): Payer: Medicare Other

## 2014-11-11 DIAGNOSIS — Z954 Presence of other heart-valve replacement: Secondary | ICD-10-CM

## 2014-11-11 DIAGNOSIS — I350 Nonrheumatic aortic (valve) stenosis: Secondary | ICD-10-CM

## 2014-11-11 DIAGNOSIS — I359 Nonrheumatic aortic valve disorder, unspecified: Secondary | ICD-10-CM

## 2014-11-11 DIAGNOSIS — Z952 Presence of prosthetic heart valve: Secondary | ICD-10-CM

## 2014-11-11 LAB — POCT INR: INR: 2.9

## 2014-11-21 ENCOUNTER — Encounter: Payer: Self-pay | Admitting: Family Medicine

## 2014-11-21 ENCOUNTER — Ambulatory Visit (INDEPENDENT_AMBULATORY_CARE_PROVIDER_SITE_OTHER): Payer: Medicare Other | Admitting: Family Medicine

## 2014-11-21 VITALS — BP 175/60 | HR 67 | Temp 98.6°F | Ht 66.0 in | Wt 189.8 lb

## 2014-11-21 DIAGNOSIS — I10 Essential (primary) hypertension: Secondary | ICD-10-CM

## 2014-11-21 DIAGNOSIS — F0390 Unspecified dementia without behavioral disturbance: Secondary | ICD-10-CM

## 2014-11-21 DIAGNOSIS — Z7189 Other specified counseling: Secondary | ICD-10-CM

## 2014-11-21 DIAGNOSIS — I6529 Occlusion and stenosis of unspecified carotid artery: Secondary | ICD-10-CM

## 2014-11-21 DIAGNOSIS — Z515 Encounter for palliative care: Secondary | ICD-10-CM

## 2014-11-21 DIAGNOSIS — M159 Polyosteoarthritis, unspecified: Secondary | ICD-10-CM | POA: Diagnosis not present

## 2014-11-21 DIAGNOSIS — G8929 Other chronic pain: Secondary | ICD-10-CM

## 2014-11-21 DIAGNOSIS — I6523 Occlusion and stenosis of bilateral carotid arteries: Secondary | ICD-10-CM | POA: Diagnosis not present

## 2014-11-21 MED ORDER — HYDROCODONE-ACETAMINOPHEN 10-325 MG PO TABS: 1.0000 | ORAL_TABLET | Freq: Three times a day (TID) | ORAL | Status: DC | PRN

## 2014-11-21 MED ORDER — HYDROCHLOROTHIAZIDE 25 MG PO TABS: 25.0000 mg | ORAL_TABLET | Freq: Every day | ORAL | Status: DC

## 2014-11-21 MED ORDER — HYDROCODONE-ACETAMINOPHEN 10-325 MG PO TABS: 1.0000 | ORAL_TABLET | Freq: Four times a day (QID) | ORAL | Status: DC | PRN

## 2014-11-21 NOTE — Assessment & Plan Note (Signed)
Given comorbid dementia, the short term is more important than the long term.  As such, will stay off statin.

## 2014-11-21 NOTE — Assessment & Plan Note (Signed)
Refill pain meds 

## 2014-11-21 NOTE — Assessment & Plan Note (Signed)
Increase HCTZ

## 2014-11-21 NOTE — Assessment & Plan Note (Signed)
Slowly progressive.  No change in treatment needed.

## 2014-11-21 NOTE — Patient Instructions (Addendum)
The blood pressure is a little high today.  Please start taking HCTZ 25 mg daily. I refilled the pain medicine. The offer for more physical therapy is always there.  Let me know if you change your mind.   OK to stay off the pravastatin. At your convenience, please bring me a copy of the living will, health care power of attorney.

## 2014-11-21 NOTE — Progress Notes (Signed)
   Subjective:    Patient ID: Juan Li, male    DOB: [birth year], 81 y.o.   MRN: [id number]  HPI Several issues. 1. Mainly here for a refill of his chronic pain meds.  Helps him stay functional. 2. BP elevated today.  I worry about over treatment.  No orthostatic symptoms.  It is hard for them to cut the HCTZ in half 3. Memory continues slow decline.  Still fusses at wife quite a bit.   4. Gait is a chronic problem.  Poor balance.  Not many falls.  Wife stopped pravastan.  Seems to be a little better off pravastatin. 5. End of life care.  They have living will and health care power of attorney.  Does not want resusitation, long term vent support.  See Problem list.    Review of Systems     Objective:   Physical Exam Weak quads. Gait is shuffling.  Slight loss of balance when turning.  Cane helps. VS High blood pressure confirmed.          Assessment & Plan:

## 2014-11-25 ENCOUNTER — Ambulatory Visit (INDEPENDENT_AMBULATORY_CARE_PROVIDER_SITE_OTHER): Payer: Medicare Other

## 2014-11-25 DIAGNOSIS — Z952 Presence of prosthetic heart valve: Secondary | ICD-10-CM

## 2014-11-25 DIAGNOSIS — I359 Nonrheumatic aortic valve disorder, unspecified: Secondary | ICD-10-CM

## 2014-11-25 DIAGNOSIS — Z954 Presence of other heart-valve replacement: Secondary | ICD-10-CM | POA: Diagnosis not present

## 2014-11-25 DIAGNOSIS — I350 Nonrheumatic aortic (valve) stenosis: Secondary | ICD-10-CM | POA: Diagnosis not present

## 2014-11-25 LAB — POCT INR: INR: 5.7

## 2014-12-05 ENCOUNTER — Ambulatory Visit (INDEPENDENT_AMBULATORY_CARE_PROVIDER_SITE_OTHER): Payer: Medicare Other | Admitting: *Deleted

## 2014-12-05 DIAGNOSIS — Z954 Presence of other heart-valve replacement: Secondary | ICD-10-CM | POA: Diagnosis not present

## 2014-12-05 DIAGNOSIS — I350 Nonrheumatic aortic (valve) stenosis: Secondary | ICD-10-CM

## 2014-12-05 DIAGNOSIS — Z952 Presence of prosthetic heart valve: Secondary | ICD-10-CM

## 2014-12-05 DIAGNOSIS — I359 Nonrheumatic aortic valve disorder, unspecified: Secondary | ICD-10-CM | POA: Diagnosis not present

## 2014-12-05 LAB — POCT INR: INR: 1.4

## 2014-12-15 ENCOUNTER — Ambulatory Visit (INDEPENDENT_AMBULATORY_CARE_PROVIDER_SITE_OTHER): Payer: Medicare Other

## 2014-12-15 DIAGNOSIS — Z952 Presence of prosthetic heart valve: Secondary | ICD-10-CM

## 2014-12-15 DIAGNOSIS — I359 Nonrheumatic aortic valve disorder, unspecified: Secondary | ICD-10-CM

## 2014-12-15 DIAGNOSIS — I350 Nonrheumatic aortic (valve) stenosis: Secondary | ICD-10-CM

## 2014-12-15 DIAGNOSIS — Z954 Presence of other heart-valve replacement: Secondary | ICD-10-CM

## 2014-12-15 LAB — POCT INR: INR: 1.6

## 2014-12-25 ENCOUNTER — Ambulatory Visit (INDEPENDENT_AMBULATORY_CARE_PROVIDER_SITE_OTHER): Payer: Medicare Other | Admitting: *Deleted

## 2014-12-25 DIAGNOSIS — Z952 Presence of prosthetic heart valve: Secondary | ICD-10-CM

## 2014-12-25 DIAGNOSIS — I350 Nonrheumatic aortic (valve) stenosis: Secondary | ICD-10-CM | POA: Diagnosis not present

## 2014-12-25 DIAGNOSIS — Z954 Presence of other heart-valve replacement: Secondary | ICD-10-CM | POA: Diagnosis not present

## 2014-12-25 DIAGNOSIS — I359 Nonrheumatic aortic valve disorder, unspecified: Secondary | ICD-10-CM

## 2014-12-25 LAB — POCT INR: INR: 3

## 2014-12-30 ENCOUNTER — Other Ambulatory Visit: Payer: Self-pay | Admitting: Cardiology

## 2014-12-31 ENCOUNTER — Ambulatory Visit (INDEPENDENT_AMBULATORY_CARE_PROVIDER_SITE_OTHER): Payer: Medicare Other | Admitting: Cardiology

## 2014-12-31 VITALS — BP 142/62 | HR 52 | Ht 66.0 in | Wt 190.0 lb

## 2014-12-31 DIAGNOSIS — E7800 Pure hypercholesterolemia, unspecified: Secondary | ICD-10-CM

## 2014-12-31 DIAGNOSIS — I5032 Chronic diastolic (congestive) heart failure: Secondary | ICD-10-CM | POA: Diagnosis not present

## 2014-12-31 DIAGNOSIS — Z952 Presence of prosthetic heart valve: Secondary | ICD-10-CM

## 2014-12-31 DIAGNOSIS — Z954 Presence of other heart-valve replacement: Secondary | ICD-10-CM

## 2014-12-31 DIAGNOSIS — I6523 Occlusion and stenosis of bilateral carotid arteries: Secondary | ICD-10-CM

## 2014-12-31 DIAGNOSIS — I6522 Occlusion and stenosis of left carotid artery: Secondary | ICD-10-CM | POA: Diagnosis not present

## 2014-12-31 MED ORDER — PRAVASTATIN SODIUM 40 MG PO TABS: 40.0000 mg | ORAL_TABLET | Freq: Every evening | ORAL | Status: DC

## 2014-12-31 NOTE — Patient Instructions (Signed)
Medication Instructions:  Take pravastatin 40mg  with your evening meal.  Labwork: None today  Testing/Procedures: None today  Follow-Up: Your physician wants you to follow-up in: 6 months with Dr Aundra Dubin. (February 2017). You will receive a reminder letter in the mail two months in advance. If you don't receive a letter, please call our office to schedule the follow-up appointment.

## 2015-01-01 ENCOUNTER — Encounter: Payer: Self-pay | Admitting: Cardiology

## 2015-01-01 NOTE — Progress Notes (Signed)
Patient ID: Juan Li, male   DOB: [birth year], 81 y.o.   MRN: [id number] PCP: Dr. Andria Frames  81 yo with history of AS s/p St Jude AVR and chronic diastolic CHF presents for cardiology followup.  His valve was replaced in 1993.  Echo in 10/13 showed normal EF and a well-seated mechanical valve.  Cardiac cath in 1993 showed nonobstructive disease.  He has been started on Namenda and Aricept for some degree of dementia.  He had a holter monitor in 6/15 due to bradycardia, this actually showed a 17 beat run of NSVT and short runs of atrial tachycardia.  Otherwise, average HR was 61 and it was decided that he was too bradycardic to increase beta blocker.  Lexiscan Cardiolite was done in 6/15 due to NSVT and showed no ischemia.  3/16 carotid dopplers showed > 54% LICA stenosis.  He has been asymptomatic with no stroke-like symptoms.   Patient seems clinically stable.  Weight is down 6 lbs.  Poor functional status. He walks some with a cane but has difficulty with balance.  No recent falls.  With the minimal walking that he does, he does not have any dyspnea.  No chest pain.  He has c-spine stenosis which likely is affected his balance.  He stopped pravastatin to see if this would help his balance and walking but stopping it did not.  No BRBPR or melena.   ECG: NSR at 52, nonspecific T wave changes.   Labs (1/12): K 4.8, creatinine 1.3 Labs (12/12): creatinine 1.2, LDL 177 Labs (10/13): creatinine 1.6 => 1.1, LDL 135 Labs (12/13): LDL 74, HDL 30 Labs (4/14): K 4.6 => 4.8, creatinine 1.1 => 1.3, TSH normal, HCT 37.6, BNP 180 Labs (6/14): K 4.3, creatinine 1.3 Labs (8/14): K 4.8, creatinine 1.5, BNP 100 Labs (10/14): K 4.1, creatinine 1.3, BNP 184, LDL 100, HDL 35 Labs (1/15): K 4, creatinine 1.4, BNP 204 Labs (3/15): K 4.6, creatinine 1.4, BNP 110 Labs (7/15): K 3.9, creatinine 1.2 Labs (10/15): K 4.8, creatinine 1.3, LDL 134, HDL 31 Labls (2/16): LDL 99, HDL 39  PMH: 1. Dementia: On Aricept and  Namenda.   2. Hyperlipidemia: myalgias with Vytorin, tolerating pravastatin 3. BPH 4. Osteoarthritis right knee, s/p TKR 10/13.  5. Cervical myelopathy  6. Obesity 7. HTN 8. Aortic stenosis: St. Jude mechanical aortic valve placed in 1993. Echo (10/13) with EF 65-70% and normal function of the aortic valve (mean gradient 12 mmHg).   9. Cardiac cath in 1993 with nonobstructive disease.  Lexiscan Cardiolite (6/15) with EF 56%, inferior thinning, no ischemia.  10. L-spine disease/spinal stenosis.  11. Abdominal ultrasound in 12/11 did not show AAA 12. Gout 13. Shoulder surgery 1/12 14. CKD 15. Chronic diastolic CHF: Echo (65/68) with EF 65-70%, normally-functioning mechanical aortic valve (mean gradient 12).   16. Sinus bradycardia: Holter (6/15) showed 17 beats NSVT, several short runs atrial tachycardia, HR 46-95 mean 61.   17. Carotid stenosis: Carotid dopplers (1/27) with > 51% LICA stenosis.  18. c-spine stenosis 19. Peripheral neuropathy.   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  Married.  Does not smoke.   FH: No premature CAD.   ROS: All systems reviewed and negative except as per HPI.    Current Outpatient Prescriptions  Medication Sig Dispense Refill  . aspirin EC 81 MG tablet Take 81 mg by mouth every morning.     . donepezil (ARICEPT) 5 MG tablet ONE BY MOUTH DAILY 90 tablet 3  . enalapril (VASOTEC)  10 MG tablet TAKE 1 TABLET (10 MG TOTAL) BY MOUTH DAILY. 90 tablet 1  . finasteride (PROSCAR) 5 MG tablet Take 1 tablet (5 mg total) by mouth daily. 90 tablet 3  . hydrochlorothiazide (HYDRODIURIL) 25 MG tablet Take 1 tablet (25 mg total) by mouth daily. 90 tablet 3  . HYDROcodone-acetaminophen (NORCO) 10-325 MG per tablet Take 1 tablet by mouth every 6 (six) hours as needed. 120 tablet 0  . memantine (NAMENDA) 5 MG tablet Take 1 tablet (5 mg total) by mouth 2 (two) times daily. 180 tablet 3  . metoprolol tartrate (LOPRESSOR) 25 MG tablet Take 12.5 mg by mouth. Take  1/2 tablet by mouth two times daily    . potassium chloride SA (K-DUR,KLOR-CON) 20 MEQ tablet 1 tablet by mouth every other day (Patient taking differently: Take 20 mEq by mouth daily as needed (when taking lasix). ) 45 tablet 1  . tamsulosin (FLOMAX) 0.4 MG CAPS capsule Take 1 capsule (0.4 mg total) by mouth daily. 90 capsule 3  . Vitamin D, Ergocalciferol, (DRISDOL) 50000 UNITS CAPS capsule TAKE 1 CAPSULE BY MOUTH DAILY FOR 1 WEEK THEN WEEKLY THEREAFTER 15 capsule 3  . warfarin (COUMADIN) 5 MG tablet TAKE AS DIRECTED 100 tablet 3  . metoprolol tartrate (LOPRESSOR) 25 MG tablet Take 0.5 tablets (12.5 mg total) by mouth 2 (two) times daily. TAKE 1 TABLET (25 MG TOTAL) BY MOUTH 2 (TWO) TIMES DAILY. (Patient taking differently: Take 12.5 mg by mouth 2 (two) times daily. ) 180 tablet 3  . pravastatin (PRAVACHOL) 40 MG tablet Take 1 tablet (40 mg total) by mouth every evening. 90 tablet 1   No current facility-administered medications for this visit.    BP 142/62 mmHg  Pulse 52  Ht 5\' 6"  (1.676 m)  Wt 190 lb (86.183 kg)  BMI 30.68 kg/m2 General: NAD, overweight Neck: Thick, JVP 7, no thyromegaly or thyroid nodule.  Lungs: Clear to auscultation bilaterally with normal respiratory effort. CV: Nondisplaced PMI.  Heart regular S1/S2, mechanical S2, no S3/S4, 1/6 SEM RUSB.  1+ edema at ankles bilaterally. Left carotid bruit.  Normal pedal pulses.  Abdomen: Soft, nontender, no hepatosplenomegaly, no distention.  Neurologic: Alert and oriented x 3.  Psych: Normal affect. Extremities: No clubbing or cyanosis.   Assessment/Plan:  Aortic valve disorders  Mechanical aortic valve.  No bleeding problems on coumadin. Continue ASA 81 also.   Valve appeared normal on 10/13 echo.  Crisp S2 on exam today.  HYPERCHOLESTEROLEMIA  Given vascular disease (carotid stenosis), I think that he should restart pravastatin.  I will get lipids/LFTs in 2 months after restarting.  HYPERTENSION BP ok today.   Chronic  diastolic CHF He is not significantly volume overloaded and is no longer taking Lasix.    Bradycardia History of bradycardia, HR in 50s today (ok).  Imbalance Peripheral neuropathy plays a role.  He has C-spine stenosis.  Carotid stenosis Asymptomatic > 29% LICA stenosis. He would not qualify for carotid stent outside a trial.  Given his poor functional status, I think it would be best to avoid surgery (CEA).  He should continue ASA 81 and start back on statin.   Followup 6 months   [name] 01/01/2015

## 2015-01-03 ENCOUNTER — Other Ambulatory Visit: Payer: Self-pay | Admitting: Cardiology

## 2015-01-08 ENCOUNTER — Ambulatory Visit (INDEPENDENT_AMBULATORY_CARE_PROVIDER_SITE_OTHER): Payer: Medicare Other | Admitting: *Deleted

## 2015-01-08 DIAGNOSIS — I359 Nonrheumatic aortic valve disorder, unspecified: Secondary | ICD-10-CM | POA: Diagnosis not present

## 2015-01-08 DIAGNOSIS — Z954 Presence of other heart-valve replacement: Secondary | ICD-10-CM

## 2015-01-08 DIAGNOSIS — Z952 Presence of prosthetic heart valve: Secondary | ICD-10-CM

## 2015-01-08 DIAGNOSIS — I350 Nonrheumatic aortic (valve) stenosis: Secondary | ICD-10-CM | POA: Diagnosis not present

## 2015-01-08 LAB — POCT INR: INR: 4.4

## 2015-01-22 ENCOUNTER — Ambulatory Visit (INDEPENDENT_AMBULATORY_CARE_PROVIDER_SITE_OTHER): Payer: Medicare Other | Admitting: *Deleted

## 2015-01-22 DIAGNOSIS — I359 Nonrheumatic aortic valve disorder, unspecified: Secondary | ICD-10-CM | POA: Diagnosis not present

## 2015-01-22 DIAGNOSIS — Z954 Presence of other heart-valve replacement: Secondary | ICD-10-CM | POA: Diagnosis not present

## 2015-01-22 DIAGNOSIS — Z952 Presence of prosthetic heart valve: Secondary | ICD-10-CM

## 2015-01-22 DIAGNOSIS — I350 Nonrheumatic aortic (valve) stenosis: Secondary | ICD-10-CM

## 2015-01-22 LAB — POCT INR: INR: 3.2

## 2015-02-12 ENCOUNTER — Ambulatory Visit (INDEPENDENT_AMBULATORY_CARE_PROVIDER_SITE_OTHER): Payer: Medicare Other | Admitting: *Deleted

## 2015-02-12 DIAGNOSIS — Z7901 Long term (current) use of anticoagulants: Secondary | ICD-10-CM

## 2015-02-12 DIAGNOSIS — Z954 Presence of other heart-valve replacement: Secondary | ICD-10-CM | POA: Diagnosis not present

## 2015-02-12 DIAGNOSIS — I350 Nonrheumatic aortic (valve) stenosis: Secondary | ICD-10-CM | POA: Diagnosis not present

## 2015-02-12 DIAGNOSIS — I359 Nonrheumatic aortic valve disorder, unspecified: Secondary | ICD-10-CM

## 2015-02-12 DIAGNOSIS — Z952 Presence of prosthetic heart valve: Secondary | ICD-10-CM

## 2015-02-12 LAB — POCT INR: INR: 1.4

## 2015-02-23 ENCOUNTER — Ambulatory Visit (INDEPENDENT_AMBULATORY_CARE_PROVIDER_SITE_OTHER): Payer: Medicare Other | Admitting: Pharmacist

## 2015-02-23 DIAGNOSIS — Z7901 Long term (current) use of anticoagulants: Secondary | ICD-10-CM | POA: Diagnosis not present

## 2015-02-23 DIAGNOSIS — I350 Nonrheumatic aortic (valve) stenosis: Secondary | ICD-10-CM

## 2015-02-23 DIAGNOSIS — I359 Nonrheumatic aortic valve disorder, unspecified: Secondary | ICD-10-CM

## 2015-02-23 DIAGNOSIS — Z954 Presence of other heart-valve replacement: Secondary | ICD-10-CM

## 2015-02-23 DIAGNOSIS — Z952 Presence of prosthetic heart valve: Secondary | ICD-10-CM

## 2015-02-23 LAB — POCT INR: INR: 2.7

## 2015-03-12 ENCOUNTER — Ambulatory Visit (INDEPENDENT_AMBULATORY_CARE_PROVIDER_SITE_OTHER): Payer: Medicare Other | Admitting: Family Medicine

## 2015-03-12 VITALS — BP 160/107 | HR 53 | Temp 97.6°F | Wt 187.9 lb

## 2015-03-12 DIAGNOSIS — Z7189 Other specified counseling: Secondary | ICD-10-CM | POA: Diagnosis not present

## 2015-03-12 DIAGNOSIS — Z23 Encounter for immunization: Secondary | ICD-10-CM

## 2015-03-12 DIAGNOSIS — I10 Essential (primary) hypertension: Secondary | ICD-10-CM | POA: Diagnosis not present

## 2015-03-12 DIAGNOSIS — M159 Polyosteoarthritis, unspecified: Secondary | ICD-10-CM

## 2015-03-12 DIAGNOSIS — G8929 Other chronic pain: Secondary | ICD-10-CM

## 2015-03-12 DIAGNOSIS — I6523 Occlusion and stenosis of bilateral carotid arteries: Secondary | ICD-10-CM | POA: Diagnosis not present

## 2015-03-12 MED ORDER — HYDROCODONE-ACETAMINOPHEN 10-325 MG PO TABS: 1.0000 | ORAL_TABLET | Freq: Four times a day (QID) | ORAL | Status: DC | PRN

## 2015-03-12 MED ORDER — ENALAPRIL MALEATE 10 MG PO TABS: 10.0000 mg | ORAL_TABLET | Freq: Two times a day (BID) | ORAL | Status: DC

## 2015-03-12 MED ORDER — HYDROCODONE-ACETAMINOPHEN 10-325 MG PO TABS: 1.0000 | ORAL_TABLET | Freq: Three times a day (TID) | ORAL | Status: DC | PRN

## 2015-03-12 NOTE — Patient Instructions (Signed)
You get a flu shot and pain pills. I want you to start taking your enalapril two times per day because the blood pressure is a bit high.  I sent in a new prescription. See me in three months.

## 2015-03-13 ENCOUNTER — Encounter: Payer: Self-pay | Admitting: Family Medicine

## 2015-03-13 NOTE — Assessment & Plan Note (Signed)
Refilled pain meds, which allow him to be functional.

## 2015-03-13 NOTE — Assessment & Plan Note (Signed)
Increase enalapril to 10 mg bid.

## 2015-03-13 NOTE — Progress Notes (Signed)
   Subjective:    Patient ID: Juan Li, male    DOB: [birth year], 81 y.o.   MRN: [id number]  HPI  Three issues: 1. Needs flu shot 2. Needs refill on pain meds.  Doing well, still up and around. 3. Hypertension.  Because of his fall risk and elderly, my goal BP is less than 0000000 systolic.  Still, he is above that today.  No chest pain or SOB.    Review of Systems     Objective:   Physical ExamVS noted Lungs clear Cardiac RRR without m or g         Assessment & Plan:

## 2015-03-18 ENCOUNTER — Ambulatory Visit (INDEPENDENT_AMBULATORY_CARE_PROVIDER_SITE_OTHER): Payer: Medicare Other | Admitting: *Deleted

## 2015-03-18 DIAGNOSIS — Z954 Presence of other heart-valve replacement: Secondary | ICD-10-CM

## 2015-03-18 DIAGNOSIS — I350 Nonrheumatic aortic (valve) stenosis: Secondary | ICD-10-CM

## 2015-03-18 DIAGNOSIS — Z7901 Long term (current) use of anticoagulants: Secondary | ICD-10-CM | POA: Diagnosis not present

## 2015-03-18 DIAGNOSIS — I359 Nonrheumatic aortic valve disorder, unspecified: Secondary | ICD-10-CM

## 2015-03-18 DIAGNOSIS — Z952 Presence of prosthetic heart valve: Secondary | ICD-10-CM

## 2015-03-18 LAB — POCT INR: INR: 4.3

## 2015-04-01 ENCOUNTER — Ambulatory Visit (INDEPENDENT_AMBULATORY_CARE_PROVIDER_SITE_OTHER): Payer: Medicare Other | Admitting: *Deleted

## 2015-04-01 DIAGNOSIS — Z954 Presence of other heart-valve replacement: Secondary | ICD-10-CM | POA: Diagnosis not present

## 2015-04-01 DIAGNOSIS — Z952 Presence of prosthetic heart valve: Secondary | ICD-10-CM

## 2015-04-01 DIAGNOSIS — Z7901 Long term (current) use of anticoagulants: Secondary | ICD-10-CM

## 2015-04-01 DIAGNOSIS — I359 Nonrheumatic aortic valve disorder, unspecified: Secondary | ICD-10-CM | POA: Diagnosis not present

## 2015-04-01 DIAGNOSIS — I350 Nonrheumatic aortic (valve) stenosis: Secondary | ICD-10-CM | POA: Diagnosis not present

## 2015-04-01 LAB — POCT INR: INR: 2.4

## 2015-04-02 ENCOUNTER — Other Ambulatory Visit: Payer: Self-pay | Admitting: Family Medicine

## 2015-04-22 ENCOUNTER — Ambulatory Visit (INDEPENDENT_AMBULATORY_CARE_PROVIDER_SITE_OTHER): Payer: Medicare Other | Admitting: *Deleted

## 2015-04-22 DIAGNOSIS — Z7901 Long term (current) use of anticoagulants: Secondary | ICD-10-CM

## 2015-04-22 DIAGNOSIS — I359 Nonrheumatic aortic valve disorder, unspecified: Secondary | ICD-10-CM

## 2015-04-22 DIAGNOSIS — I350 Nonrheumatic aortic (valve) stenosis: Secondary | ICD-10-CM | POA: Diagnosis not present

## 2015-04-22 DIAGNOSIS — Z954 Presence of other heart-valve replacement: Secondary | ICD-10-CM | POA: Diagnosis not present

## 2015-04-22 DIAGNOSIS — Z952 Presence of prosthetic heart valve: Secondary | ICD-10-CM

## 2015-04-22 LAB — POCT INR: INR: 2.5

## 2015-05-20 ENCOUNTER — Ambulatory Visit (INDEPENDENT_AMBULATORY_CARE_PROVIDER_SITE_OTHER): Payer: Medicare Other | Admitting: *Deleted

## 2015-05-20 DIAGNOSIS — Z954 Presence of other heart-valve replacement: Secondary | ICD-10-CM | POA: Diagnosis not present

## 2015-05-20 DIAGNOSIS — Z7901 Long term (current) use of anticoagulants: Secondary | ICD-10-CM

## 2015-05-20 DIAGNOSIS — Z952 Presence of prosthetic heart valve: Secondary | ICD-10-CM

## 2015-05-20 DIAGNOSIS — I359 Nonrheumatic aortic valve disorder, unspecified: Secondary | ICD-10-CM | POA: Diagnosis not present

## 2015-05-20 DIAGNOSIS — I350 Nonrheumatic aortic (valve) stenosis: Secondary | ICD-10-CM | POA: Diagnosis not present

## 2015-05-20 LAB — POCT INR: INR: 2.7

## 2015-06-03 ENCOUNTER — Other Ambulatory Visit: Payer: Self-pay | Admitting: Cardiology

## 2015-06-10 ENCOUNTER — Ambulatory Visit (INDEPENDENT_AMBULATORY_CARE_PROVIDER_SITE_OTHER): Payer: Medicare Other | Admitting: Family Medicine

## 2015-06-10 ENCOUNTER — Encounter: Payer: Self-pay | Admitting: Family Medicine

## 2015-06-10 VITALS — BP 116/89 | HR 53 | Temp 97.5°F | Wt 182.6 lb

## 2015-06-10 DIAGNOSIS — N183 Chronic kidney disease, stage 3 unspecified: Secondary | ICD-10-CM

## 2015-06-10 DIAGNOSIS — I10 Essential (primary) hypertension: Secondary | ICD-10-CM

## 2015-06-10 DIAGNOSIS — E7800 Pure hypercholesterolemia, unspecified: Secondary | ICD-10-CM | POA: Diagnosis not present

## 2015-06-10 DIAGNOSIS — I5032 Chronic diastolic (congestive) heart failure: Secondary | ICD-10-CM

## 2015-06-10 DIAGNOSIS — N400 Enlarged prostate without lower urinary tract symptoms: Secondary | ICD-10-CM

## 2015-06-10 DIAGNOSIS — F0390 Unspecified dementia without behavioral disturbance: Secondary | ICD-10-CM

## 2015-06-10 DIAGNOSIS — M159 Polyosteoarthritis, unspecified: Secondary | ICD-10-CM | POA: Diagnosis not present

## 2015-06-10 LAB — LIPID PANEL
CHOLESTEROL: 165 mg/dL (ref 125–200)
HDL: 35 mg/dL — AB (ref >=40)
LDL CALC: 89 mg/dL (ref <130)
TRIGLYCERIDES: 206 mg/dL — AB (ref <150)
Total CHOL/HDL Ratio: 4.7 Ratio (ref <=5.0)
VLDL: 41 mg/dL — AB (ref <30)

## 2015-06-10 LAB — COMPLETE METABOLIC PANEL WITH GFR
ALT: 8 U/L — AB (ref 9–46)
AST: 17 U/L (ref 10–35)
Albumin: 4.2 g/dL (ref 3.6–5.1)
Alkaline Phosphatase: 48 U/L (ref 40–115)
BUN: 25 mg/dL (ref 7–25)
CHLORIDE: 104 mmol/L (ref 98–110)
CO2: 24 mmol/L (ref 20–31)
CREATININE: 1.44 mg/dL — AB (ref 0.70–1.11)
Calcium: 9.6 mg/dL (ref 8.6–10.3)
GFR, EST AFRICAN AMERICAN: 52 mL/min — AB (ref >=60)
GFR, EST NON AFRICAN AMERICAN: 45 mL/min — AB (ref >=60)
GLUCOSE: 83 mg/dL (ref 65–99)
Potassium: 4.8 mmol/L (ref 3.5–5.3)
SODIUM: 138 mmol/L (ref 135–146)
TOTAL PROTEIN: 7.2 g/dL (ref 6.1–8.1)
Total Bilirubin: 1.2 mg/dL (ref 0.2–1.2)

## 2015-06-10 MED ORDER — HYDROCODONE-ACETAMINOPHEN 10-325 MG PO TABS: 1.0000 | ORAL_TABLET | Freq: Three times a day (TID) | ORAL | Status: DC | PRN

## 2015-06-10 MED ORDER — HYDROCODONE-ACETAMINOPHEN 10-325 MG PO TABS: 1.0000 | ORAL_TABLET | Freq: Four times a day (QID) | ORAL | Status: DC | PRN

## 2015-06-10 NOTE — Patient Instructions (Signed)
You are on two medications for your prostate.  Since you are doing OK, no changes.  If things get worse, the next step would be some sort of surgery.  You would need to see the urologist. Double check your medications to make sure my list matches with what you are taking. I want to do some blood work today.  I will call with the results.   Enjoy the circus.

## 2015-06-11 ENCOUNTER — Encounter: Payer: Self-pay | Admitting: Family Medicine

## 2015-06-11 NOTE — Assessment & Plan Note (Signed)
Stable, refill chronic pain meds.

## 2015-06-11 NOTE — Assessment & Plan Note (Signed)
Mildly syptomatic despite meds.  No change for now.  Reserve surgical option.

## 2015-06-11 NOTE — Progress Notes (Signed)
   Subjective:    Patient ID: Juan Li, male    DOB: 1931-10-28, 82 y.o.   MRN: [id number]  HPI  In for refill of pain meds.  They keep him moving.  No recent falls. Other issues. Memory continues to slowly deteriorate.  Still functional at home.  See next problem for example. Poor urine flow.  Didn't think he was taking any prostate medications.  Actually on two (wife organizes and supervises medications.)  Current flow level is acceptable.  Thought on his last visit to uro that they told him there was nothing else he could do.  Educated him and wife that surgical options still available.   Hypertension, no orthostatic symptoms.  Feels asymptomatic.   No recent cholesterol or renal check. Can't do good med rec in that did not bring meds.   Still on coumadin - but that is followed by cards in coumadin clinic. No gout flairs since stopping diuretics. CHF asymptomatic.  No SOB or edema off diuretics.   Review of Systems     Objective:   Physical Exam Cardiac, RRR with 2/6 SEM Lungs clear VS noted to be good. Gait is shuffling.        Assessment & Plan:

## 2015-06-11 NOTE — Assessment & Plan Note (Signed)
Stable on current meds 

## 2015-06-11 NOTE — Assessment & Plan Note (Addendum)
Asymptomatic,  GFR now 45

## 2015-06-17 ENCOUNTER — Ambulatory Visit (INDEPENDENT_AMBULATORY_CARE_PROVIDER_SITE_OTHER): Payer: Medicare Other | Admitting: *Deleted

## 2015-06-17 DIAGNOSIS — Z954 Presence of other heart-valve replacement: Secondary | ICD-10-CM

## 2015-06-17 DIAGNOSIS — I359 Nonrheumatic aortic valve disorder, unspecified: Secondary | ICD-10-CM

## 2015-06-17 DIAGNOSIS — I350 Nonrheumatic aortic (valve) stenosis: Secondary | ICD-10-CM

## 2015-06-17 DIAGNOSIS — Z952 Presence of prosthetic heart valve: Secondary | ICD-10-CM

## 2015-06-17 DIAGNOSIS — Z7901 Long term (current) use of anticoagulants: Secondary | ICD-10-CM

## 2015-06-17 LAB — POCT INR: INR: 3

## 2015-06-18 ENCOUNTER — Other Ambulatory Visit: Payer: Self-pay | Admitting: Family Medicine

## 2015-06-18 ENCOUNTER — Other Ambulatory Visit: Payer: Self-pay | Admitting: Cardiology

## 2015-07-15 ENCOUNTER — Ambulatory Visit (INDEPENDENT_AMBULATORY_CARE_PROVIDER_SITE_OTHER): Payer: Medicare Other | Admitting: *Deleted

## 2015-07-15 DIAGNOSIS — I359 Nonrheumatic aortic valve disorder, unspecified: Secondary | ICD-10-CM

## 2015-07-15 DIAGNOSIS — Z954 Presence of other heart-valve replacement: Secondary | ICD-10-CM | POA: Diagnosis not present

## 2015-07-15 DIAGNOSIS — I350 Nonrheumatic aortic (valve) stenosis: Secondary | ICD-10-CM | POA: Diagnosis not present

## 2015-07-15 DIAGNOSIS — Z7901 Long term (current) use of anticoagulants: Secondary | ICD-10-CM | POA: Diagnosis not present

## 2015-07-15 DIAGNOSIS — Z952 Presence of prosthetic heart valve: Secondary | ICD-10-CM

## 2015-07-15 LAB — POCT INR: INR: 3.3

## 2015-07-20 ENCOUNTER — Other Ambulatory Visit: Payer: Self-pay | Admitting: Cardiology

## 2015-07-20 ENCOUNTER — Other Ambulatory Visit: Payer: Self-pay | Admitting: Family Medicine

## 2015-07-30 ENCOUNTER — Encounter: Payer: Self-pay | Admitting: Family Medicine

## 2015-07-30 ENCOUNTER — Ambulatory Visit (INDEPENDENT_AMBULATORY_CARE_PROVIDER_SITE_OTHER): Payer: Medicare Other | Admitting: Family Medicine

## 2015-07-30 VITALS — BP 126/45 | HR 55 | Temp 97.7°F | Ht 65.0 in | Wt 181.2 lb

## 2015-07-30 DIAGNOSIS — L570 Actinic keratosis: Secondary | ICD-10-CM

## 2015-07-30 DIAGNOSIS — M7742 Metatarsalgia, left foot: Secondary | ICD-10-CM | POA: Diagnosis not present

## 2015-07-30 NOTE — Assessment & Plan Note (Signed)
With the numbness and pain, he may even have a Morton's neuroma.  Will refer to sports med for insert and for possible injection.

## 2015-07-30 NOTE — Patient Instructions (Signed)
I am pleased with your weight and blood pressure. I am referring you to dermatology for your scalp skin changes. I am referring you to sports medicine for your left foot problems. I hope you don't starve while you wife is on her cruise.

## 2015-07-30 NOTE — Progress Notes (Signed)
   Subjective:    Patient ID: Juan Li, male    DOB: 1931/09/21, 82 y.o.   MRN: [id number]  HPI Two problems: 1. Left foot pain, just proximal to toes.  Worse with walking.  Does have pain and occasional numbness between the toes.  Tried OTC insert without benefit. 2. Skin changes on scalp that he picks at and bleeds at night.  He is "pretty good" about minimizing sun exposure but not religious about it.  Slow worsening.    Review of Systems     Objective:   Physical Exam He has lots of actinic changes all over scalp.  One near crown is large and may be an early skin ca. Left foot, good pulse and sensation.  Loss of metatarsal arch.  Also pain on compression of the metatarsals.        Assessment & Plan:

## 2015-07-30 NOTE — Assessment & Plan Note (Signed)
Multiple on scalp, one large may have converted to skin ca.  Will do derm referral.

## 2015-08-18 ENCOUNTER — Ambulatory Visit (INDEPENDENT_AMBULATORY_CARE_PROVIDER_SITE_OTHER): Payer: Medicare Other | Admitting: Sports Medicine

## 2015-08-18 VITALS — BP 142/70 | Ht 66.0 in | Wt 169.0 lb

## 2015-08-18 DIAGNOSIS — M79672 Pain in left foot: Secondary | ICD-10-CM | POA: Diagnosis not present

## 2015-08-18 DIAGNOSIS — M7742 Metatarsalgia, left foot: Secondary | ICD-10-CM

## 2015-08-18 NOTE — Progress Notes (Signed)
Patient ID: Juan Li, male   DOB: 08-06-1931, 82 y.o.   MRN: [id number] CC: Left foot pain  HPI: 82 yo man presents with 6-12 months of worsening left foot pain. Painful to bear weight and walk, and more limiting lately. Feels pain some in arch but mostly along base of toes. He denies specific injury. No swelling, bruising or redness. Feels some numbness tingling in his toes and burning on bottom of left foot at times. No right foot complaints, knee or ankle problems. 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 Walks with cane for past several years for stability, but doesn't feel it helps his foot much. No history of diabetes or peripheral vascular disease. Has tried OTC gel insoles without much relief.   ROS: Denies other complaints today.  PMH: Carpal tunnel syndrome, gout, OA, CHF, ASCAD, GERD, HTN  PE: BP 142/70 mmHg  Ht 5\' 6"  (1.676 m)  Wt 169 lb (76.658 kg)  BMI 27.29 kg/m2  GEN: Elderly man, WDWN, NAD sitting on exam table, pleasant and cooperative. CV/PULM: Nonlabored respirations, skin is warm and dry, peripheral circulation grossly intact NEURO: Symmetrical facial and limb movement, requires assistance to navigate exam table. MSK: Ambulates with cane. Pes planus, L>R, pain with palpation along left metatarsal heads and some in arch. Reports good sensation to light touch. Dorsal foot, ankle and toes nontender, adequate but generally reduced ROM symmetrical with right.   A/P:  1. Pes planus, bilateral, with possible left tarsal tunnel syndrome given his nerve symptoms.  2. Metatarsalgia, chronic.   We will start with some green sports insoles with bilateral scaphoid pads. I'll also add a metatarsal pad to the left insert. Patient will follow-up with me in 4 weeks. If he notices benefit from the temporary inserts then we will consider custom orthotics at follow-up. Patient is encouraged to call with questions or concerns in the interim.

## 2015-08-26 ENCOUNTER — Ambulatory Visit (INDEPENDENT_AMBULATORY_CARE_PROVIDER_SITE_OTHER): Payer: Medicare Other | Admitting: *Deleted

## 2015-08-26 DIAGNOSIS — I359 Nonrheumatic aortic valve disorder, unspecified: Secondary | ICD-10-CM | POA: Diagnosis not present

## 2015-08-26 DIAGNOSIS — Z954 Presence of other heart-valve replacement: Secondary | ICD-10-CM | POA: Diagnosis not present

## 2015-08-26 DIAGNOSIS — Z7901 Long term (current) use of anticoagulants: Secondary | ICD-10-CM

## 2015-08-26 DIAGNOSIS — I350 Nonrheumatic aortic (valve) stenosis: Secondary | ICD-10-CM | POA: Diagnosis not present

## 2015-08-26 DIAGNOSIS — Z952 Presence of prosthetic heart valve: Secondary | ICD-10-CM

## 2015-08-26 LAB — POCT INR: INR: 2

## 2015-09-02 ENCOUNTER — Other Ambulatory Visit: Payer: Self-pay | Admitting: Family Medicine

## 2015-09-16 ENCOUNTER — Ambulatory Visit (INDEPENDENT_AMBULATORY_CARE_PROVIDER_SITE_OTHER): Payer: Medicare Other | Admitting: Sports Medicine

## 2015-09-16 ENCOUNTER — Ambulatory Visit (INDEPENDENT_AMBULATORY_CARE_PROVIDER_SITE_OTHER): Payer: Medicare Other | Admitting: *Deleted

## 2015-09-16 ENCOUNTER — Encounter: Payer: Self-pay | Admitting: Sports Medicine

## 2015-09-16 VITALS — BP 104/74 | Ht 66.0 in | Wt 181.0 lb

## 2015-09-16 DIAGNOSIS — M7742 Metatarsalgia, left foot: Secondary | ICD-10-CM

## 2015-09-16 DIAGNOSIS — Z954 Presence of other heart-valve replacement: Secondary | ICD-10-CM | POA: Diagnosis not present

## 2015-09-16 DIAGNOSIS — I359 Nonrheumatic aortic valve disorder, unspecified: Secondary | ICD-10-CM | POA: Diagnosis not present

## 2015-09-16 DIAGNOSIS — Z7901 Long term (current) use of anticoagulants: Secondary | ICD-10-CM | POA: Diagnosis not present

## 2015-09-16 DIAGNOSIS — Z952 Presence of prosthetic heart valve: Secondary | ICD-10-CM

## 2015-09-16 DIAGNOSIS — I350 Nonrheumatic aortic (valve) stenosis: Secondary | ICD-10-CM

## 2015-09-16 LAB — POCT INR: INR: 3.2

## 2015-09-16 NOTE — Progress Notes (Signed)
Patient ID: Juan Li, male   DOB: November 07, 1931, 82 y.o.   MRN: [id number] CC: Left foot pain  HPI: 81 yo man presents with 6-12 months of worsening left foot pain here for f/u.  - Painful to bear weight and walk, and more limiting lately. Feels pain some in arch but mostly along base of toes. He denies specific injury. No swelling, bruising or redness.No right foot complaints, knee or ankle problems. 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 Walks with cane for past several years for stability, but doesn't feel it helps his foot much. No history of diabetes or peripheral vascular disease. Has tried OTC gel insoles without much relief.  - Since his last visit he has been wearing green insoles with scaphoid pads and a metatarsal pad on the left. He has not noticed any appreciable improvement or worsening. He denies any dorsal foot pain although he did have some pain on exam. No new injury.  ROS: Denies other complaints today.  PMH: Carpal tunnel syndrome, gout, OA, CHF, ASCAD, GERD, HTN  PE: BP 104/74 mmHg  Ht 5\' 6"  (1.676 m)  Wt 181 lb (82.101 kg)  BMI 29.23 kg/m2  GEN: Elderly man, WDWN, NAD sitting on exam table, pleasant and cooperative. CV/PULM: Nonlabored respirations, skin is warm and dry, peripheral circulation grossly intact NEURO: Symmetrical facial and limb movement, requires assistance to navigate exam table. MSK: Ambulates with cane. Pes planus, L>R, pain with palpation along left metatarsal heads and some in arch. Reports good sensation to light touch. Ankle and toes nontender, adequate but generally reduced ROM symmetrical with right. Dorsum of right second metatarsal, distally was tender to palpation with firm pressure.  Using a 4-point cane.  Ultrasound: Long and short axis views of the second and third metatarsals on the left did not reveal any cortical irregularities or edema adjacent to the bony cortex that would suggest a stress reaction  or fracture.   A/P:  1. Pes planus, bilateral.  2. Metatarsalgia, chronic.   We will continue in the green insoles considering he has not had significant improvement since his last visit. We have switched out the small scaphoid pad for a medium scaphoid pad and added blue foam rubber to cushion the metatarsal heads. We will see him back in 3 weeks to reevaluate. He may need to use a walker to help give him support due to his unsteady gait. Call with any questions.

## 2015-09-17 ENCOUNTER — Ambulatory Visit (INDEPENDENT_AMBULATORY_CARE_PROVIDER_SITE_OTHER): Payer: Medicare Other | Admitting: Family Medicine

## 2015-09-17 ENCOUNTER — Encounter: Payer: Self-pay | Admitting: Family Medicine

## 2015-09-17 VITALS — BP 130/52 | HR 78 | Temp 98.3°F | Wt 184.1 lb

## 2015-09-17 DIAGNOSIS — M159 Polyosteoarthritis, unspecified: Secondary | ICD-10-CM | POA: Diagnosis not present

## 2015-09-17 DIAGNOSIS — L570 Actinic keratosis: Secondary | ICD-10-CM | POA: Diagnosis not present

## 2015-09-17 MED ORDER — FLUOROURACIL 5 % EX CREA: TOPICAL_CREAM | Freq: Every day | CUTANEOUS | Status: DC

## 2015-09-17 MED ORDER — HYDROCODONE-ACETAMINOPHEN 10-325 MG PO TABS: 1.0000 | ORAL_TABLET | Freq: Four times a day (QID) | ORAL | Status: DC | PRN

## 2015-09-17 NOTE — Patient Instructions (Signed)
Stop the cream when it goes from red to developing a sore.  You likely will stop the ears weeks before you stop the scalp. Stay out of the sun on this medication. Wash hands well after applying.

## 2015-09-17 NOTE — Progress Notes (Signed)
   Subjective:    Patient ID: Juan Li, male    DOB: October 31, 1931, 82 y.o.   MRN: [id number]  HPI Two issues: 1. For refill of chronic narcotics.  Helps him remain active.  No new complaints.   2. Actinic changes of scalp and ears.  Did not see dermatology for this.  Wife is present today (not at last visit.)  We talked about derm versus observation versus 5FU.  He currently limits sun exposure.    Review of Systems     Objective:   Physical Exam Lots of actinic changes both ears and scalp.  Worse on ears.  No obvious dominant lesion.       Assessment & Plan:

## 2015-09-17 NOTE — Assessment & Plan Note (Signed)
Educated wife about applying and duration.  Do not trust patient with his early dementia.  Jointly, they chose the 5FU option.

## 2015-09-17 NOTE — Assessment & Plan Note (Signed)
Refilled pain meds.  Encouraged continued ambulation.

## 2015-09-18 ENCOUNTER — Telehealth: Payer: Self-pay | Admitting: Family Medicine

## 2015-09-18 NOTE — Telephone Encounter (Signed)
Pt called because he was given 3 prescriptions of hydrocodone when he was here yesterday. Somewhere between the clinic and his home he lost them. Can we write new ones so that he can pick them up on Monday. Please let patient know when ready to pick up. jw

## 2015-09-21 NOTE — Telephone Encounter (Signed)
Called.  He found his prescriptions on the dashboard of his car.  He does not need duplicates.

## 2015-10-07 ENCOUNTER — Encounter: Payer: Self-pay | Admitting: Sports Medicine

## 2015-10-07 ENCOUNTER — Ambulatory Visit (INDEPENDENT_AMBULATORY_CARE_PROVIDER_SITE_OTHER): Payer: Medicare Other | Admitting: Sports Medicine

## 2015-10-07 VITALS — BP 108/81 | Ht 65.0 in | Wt 184.0 lb

## 2015-10-07 DIAGNOSIS — M7742 Metatarsalgia, left foot: Secondary | ICD-10-CM | POA: Diagnosis not present

## 2015-10-07 NOTE — Progress Notes (Signed)
  Subjective:   Juan Li is a 82 y.o. male with a history of Carpal tunnel syndrome, gout, OA, CHF, ASCAD, GERD, HTN here for f/u of L foot pain.  Patient reports that it is painful to bear weight on L foot and this limits his walking.  Pain is at base of toes.  After orthotics were modified at last visit, he has not felt any better.  He feels as those the insert (green insole with scaphoid pad, metatarsal pad, and metatarsal blue foam rubber cushion) cramps his toes between it and the top of his shoe.  His wife recently bought him a new pair of sneakers that he has worn without the orthotic and they feel better.  He even feels better when he walks barefoot.  He thinks that the new shoes have better arch support.  Review of Systems:  Per HPI.  Denies other complaints today  Social History: former smoker  Objective:  There were no vitals taken for this visit.  Gen:  82 y.o. male in NAD, [pleasant MSK: Ambulates with 4 point cane. Pes planus, TTP along all left metatarsal heads. No TTP of R foot.     Assessment & Plan:     Juan Li is a 82 y.o. male here for  1. Pes planus 2. Chronic metatarsalgia  Continue green insole in left shoe. Removed scaphoid and metatarsal pads and cushions. Patient to return with new sneakers that are more comfortable for further evaluation and further modification of green insoles as necessary.   Virginia Crews, MD MPH PGY-2,  [facility] 10/07/2015  10:43 AM     Patient seen and evaluated with the resident. I agree with the above plan of care. I initially removed the scaphoid pad and the metatarsal pads from his left shoe insert. However, he was still complaining that there was too much pressure in the toe box of his shoe. At that point I removed the blue poron that we had added for extra cushioning of the metatarsal heads. That left [place] with only the green sports insole which he found to be comfortable in his current shoes.  He did purchase some new tennis shoes recently and he states that these new shoes are very comfortable. I've asked both him and his wife to return to the office sometime in the next few days with those shoes so that I may inspect them and fit them with inserts as well. Given the fact that he was getting a lot of subjective cramping in the toe box of his shoe with the thin green insert, I am concerned that a custom orthotic (which is much thicker than the green insert) may not work well for him. Therefore we will continue to try to work with the green sports insoles to make his foot more comfortable.

## 2015-10-14 ENCOUNTER — Ambulatory Visit (INDEPENDENT_AMBULATORY_CARE_PROVIDER_SITE_OTHER): Payer: Medicare Other | Admitting: *Deleted

## 2015-10-14 DIAGNOSIS — I350 Nonrheumatic aortic (valve) stenosis: Secondary | ICD-10-CM

## 2015-10-14 DIAGNOSIS — I359 Nonrheumatic aortic valve disorder, unspecified: Secondary | ICD-10-CM | POA: Diagnosis not present

## 2015-10-14 DIAGNOSIS — Z7901 Long term (current) use of anticoagulants: Secondary | ICD-10-CM | POA: Diagnosis not present

## 2015-10-14 DIAGNOSIS — Z952 Presence of prosthetic heart valve: Secondary | ICD-10-CM

## 2015-10-14 DIAGNOSIS — Z954 Presence of other heart-valve replacement: Secondary | ICD-10-CM | POA: Diagnosis not present

## 2015-10-14 LAB — POCT INR: INR: 4.1

## 2015-10-28 ENCOUNTER — Telehealth: Payer: Self-pay

## 2015-10-28 ENCOUNTER — Telehealth: Payer: Self-pay | Admitting: Internal Medicine

## 2015-10-28 ENCOUNTER — Ambulatory Visit (INDEPENDENT_AMBULATORY_CARE_PROVIDER_SITE_OTHER): Payer: Medicare Other

## 2015-10-28 DIAGNOSIS — I359 Nonrheumatic aortic valve disorder, unspecified: Secondary | ICD-10-CM | POA: Diagnosis not present

## 2015-10-28 DIAGNOSIS — Z954 Presence of other heart-valve replacement: Secondary | ICD-10-CM

## 2015-10-28 DIAGNOSIS — Z7901 Long term (current) use of anticoagulants: Secondary | ICD-10-CM

## 2015-10-28 DIAGNOSIS — I350 Nonrheumatic aortic (valve) stenosis: Secondary | ICD-10-CM | POA: Diagnosis not present

## 2015-10-28 DIAGNOSIS — Z952 Presence of prosthetic heart valve: Secondary | ICD-10-CM

## 2015-10-28 LAB — POCT INR: INR: 7.9

## 2015-10-28 LAB — PROTIME-INR
INR: 6.21 (ref 0.00–1.49)
Prothrombin Time: 52.9 seconds — ABNORMAL HIGH (ref 11.6–15.2)

## 2015-10-28 NOTE — Telephone Encounter (Deleted)
Error

## 2015-10-28 NOTE — Telephone Encounter (Signed)
See other telephone note for documentation

## 2015-10-28 NOTE — Telephone Encounter (Signed)
New message      The lab tech need to give the nurse the lab results.

## 2015-10-28 NOTE — Telephone Encounter (Signed)
Elbert Ewings notified of critical PT and INR. Georgana Curio MHA RN CCM

## 2015-11-04 ENCOUNTER — Ambulatory Visit (INDEPENDENT_AMBULATORY_CARE_PROVIDER_SITE_OTHER): Payer: Medicare Other | Admitting: Pharmacist

## 2015-11-04 DIAGNOSIS — I359 Nonrheumatic aortic valve disorder, unspecified: Secondary | ICD-10-CM | POA: Diagnosis not present

## 2015-11-04 DIAGNOSIS — Z7901 Long term (current) use of anticoagulants: Secondary | ICD-10-CM

## 2015-11-04 DIAGNOSIS — S59801A Other specified injuries of right elbow, initial encounter: Secondary | ICD-10-CM | POA: Diagnosis not present

## 2015-11-04 DIAGNOSIS — Z954 Presence of other heart-valve replacement: Secondary | ICD-10-CM | POA: Diagnosis not present

## 2015-11-04 DIAGNOSIS — Z952 Presence of prosthetic heart valve: Secondary | ICD-10-CM

## 2015-11-04 DIAGNOSIS — I350 Nonrheumatic aortic (valve) stenosis: Secondary | ICD-10-CM

## 2015-11-04 LAB — POCT INR: INR: 1.5

## 2015-11-13 ENCOUNTER — Ambulatory Visit (INDEPENDENT_AMBULATORY_CARE_PROVIDER_SITE_OTHER): Payer: Medicare Other | Admitting: *Deleted

## 2015-11-13 DIAGNOSIS — I359 Nonrheumatic aortic valve disorder, unspecified: Secondary | ICD-10-CM | POA: Diagnosis not present

## 2015-11-13 DIAGNOSIS — Z7901 Long term (current) use of anticoagulants: Secondary | ICD-10-CM

## 2015-11-13 DIAGNOSIS — Z954 Presence of other heart-valve replacement: Secondary | ICD-10-CM

## 2015-11-13 DIAGNOSIS — I350 Nonrheumatic aortic (valve) stenosis: Secondary | ICD-10-CM | POA: Diagnosis not present

## 2015-11-13 DIAGNOSIS — Z952 Presence of prosthetic heart valve: Secondary | ICD-10-CM

## 2015-11-13 LAB — POCT INR: INR: 3.2

## 2015-11-19 DIAGNOSIS — S59801D Other specified injuries of right elbow, subsequent encounter: Secondary | ICD-10-CM | POA: Diagnosis not present

## 2015-11-27 ENCOUNTER — Ambulatory Visit (INDEPENDENT_AMBULATORY_CARE_PROVIDER_SITE_OTHER): Payer: Medicare Other | Admitting: *Deleted

## 2015-11-27 DIAGNOSIS — Z954 Presence of other heart-valve replacement: Secondary | ICD-10-CM | POA: Diagnosis not present

## 2015-11-27 DIAGNOSIS — I359 Nonrheumatic aortic valve disorder, unspecified: Secondary | ICD-10-CM | POA: Diagnosis not present

## 2015-11-27 DIAGNOSIS — Z952 Presence of prosthetic heart valve: Secondary | ICD-10-CM

## 2015-11-27 DIAGNOSIS — I350 Nonrheumatic aortic (valve) stenosis: Secondary | ICD-10-CM | POA: Diagnosis not present

## 2015-11-27 DIAGNOSIS — Z7901 Long term (current) use of anticoagulants: Secondary | ICD-10-CM

## 2015-11-27 LAB — POCT INR: INR: 2.4

## 2015-12-03 DIAGNOSIS — M79601 Pain in right arm: Secondary | ICD-10-CM | POA: Diagnosis not present

## 2015-12-03 DIAGNOSIS — S59801D Other specified injuries of right elbow, subsequent encounter: Secondary | ICD-10-CM | POA: Diagnosis not present

## 2015-12-10 ENCOUNTER — Ambulatory Visit (INDEPENDENT_AMBULATORY_CARE_PROVIDER_SITE_OTHER): Payer: Medicare Other | Admitting: *Deleted

## 2015-12-10 DIAGNOSIS — Z954 Presence of other heart-valve replacement: Secondary | ICD-10-CM | POA: Diagnosis not present

## 2015-12-10 DIAGNOSIS — Z7901 Long term (current) use of anticoagulants: Secondary | ICD-10-CM

## 2015-12-10 DIAGNOSIS — I350 Nonrheumatic aortic (valve) stenosis: Secondary | ICD-10-CM

## 2015-12-10 DIAGNOSIS — I359 Nonrheumatic aortic valve disorder, unspecified: Secondary | ICD-10-CM

## 2015-12-10 DIAGNOSIS — Z952 Presence of prosthetic heart valve: Secondary | ICD-10-CM

## 2015-12-10 LAB — POCT INR: INR: 3.9

## 2015-12-17 ENCOUNTER — Ambulatory Visit (INDEPENDENT_AMBULATORY_CARE_PROVIDER_SITE_OTHER): Payer: Medicare Other | Admitting: Family Medicine

## 2015-12-17 ENCOUNTER — Encounter: Payer: Self-pay | Admitting: Family Medicine

## 2015-12-17 DIAGNOSIS — Z7189 Other specified counseling: Secondary | ICD-10-CM | POA: Diagnosis not present

## 2015-12-17 DIAGNOSIS — M159 Polyosteoarthritis, unspecified: Secondary | ICD-10-CM | POA: Diagnosis not present

## 2015-12-17 DIAGNOSIS — G8929 Other chronic pain: Secondary | ICD-10-CM

## 2015-12-17 DIAGNOSIS — R269 Unspecified abnormalities of gait and mobility: Secondary | ICD-10-CM

## 2015-12-17 MED ORDER — HYDROCODONE-ACETAMINOPHEN 10-325 MG PO TABS: 1.0000 | ORAL_TABLET | Freq: Four times a day (QID) | ORAL | 0 refills | Status: DC | PRN

## 2015-12-17 NOTE — Progress Notes (Signed)
   Subjective:    Patient ID: Juan Li, male    DOB: Oct 12, 1931, 82 y.o.   MRN: [id number]  HPI Two main issues: 1. Refill of chronic pain meds.  Given age and chronic problems, I do not intend to increase.  Neither do I intend to wean.  It keeps him moving. 2. Difficulty walking.  Chronic problem, progressive.  It has been over a year since any physical therapy.      Review of Systems     Objective:   Physical Exam Gait.  Uses quad cane.  Shuffling gait.  Known chronic nerve/spinal cord damage from c spine disease, since decompressed       Assessment & Plan:

## 2015-12-17 NOTE — Assessment & Plan Note (Signed)
Continue current chronic pain regimen.

## 2015-12-17 NOTE — Patient Instructions (Addendum)
When you come back from [place] and life settles down, call me and I will order physical therapy.  [phone] and say, "It is time for Dr. Andria Frames to order physical therapy for me." You will also need a flu shot.  If you get it somewhere else, let me know so that I can update your records.   See me in three months.

## 2015-12-17 NOTE — Assessment & Plan Note (Signed)
Chronic problem.  Needs periodic physical therapy to recover loss of function.

## 2015-12-24 ENCOUNTER — Ambulatory Visit (INDEPENDENT_AMBULATORY_CARE_PROVIDER_SITE_OTHER): Payer: Medicare Other | Admitting: *Deleted

## 2015-12-24 DIAGNOSIS — Z7901 Long term (current) use of anticoagulants: Secondary | ICD-10-CM | POA: Diagnosis not present

## 2015-12-24 DIAGNOSIS — I359 Nonrheumatic aortic valve disorder, unspecified: Secondary | ICD-10-CM | POA: Diagnosis not present

## 2015-12-24 DIAGNOSIS — Z954 Presence of other heart-valve replacement: Secondary | ICD-10-CM

## 2015-12-24 DIAGNOSIS — Z952 Presence of prosthetic heart valve: Secondary | ICD-10-CM

## 2015-12-24 DIAGNOSIS — I350 Nonrheumatic aortic (valve) stenosis: Secondary | ICD-10-CM

## 2015-12-24 LAB — POCT INR: INR: 2.2

## 2016-01-07 ENCOUNTER — Ambulatory Visit (INDEPENDENT_AMBULATORY_CARE_PROVIDER_SITE_OTHER): Payer: Medicare Other | Admitting: *Deleted

## 2016-01-07 DIAGNOSIS — Z7901 Long term (current) use of anticoagulants: Secondary | ICD-10-CM | POA: Diagnosis not present

## 2016-01-07 DIAGNOSIS — I350 Nonrheumatic aortic (valve) stenosis: Secondary | ICD-10-CM

## 2016-01-07 DIAGNOSIS — I359 Nonrheumatic aortic valve disorder, unspecified: Secondary | ICD-10-CM

## 2016-01-07 DIAGNOSIS — Z954 Presence of other heart-valve replacement: Secondary | ICD-10-CM | POA: Diagnosis not present

## 2016-01-07 DIAGNOSIS — Z952 Presence of prosthetic heart valve: Secondary | ICD-10-CM

## 2016-01-07 LAB — POCT INR: INR: 2.2

## 2016-01-09 ENCOUNTER — Other Ambulatory Visit: Payer: Self-pay | Admitting: Cardiology

## 2016-01-28 ENCOUNTER — Ambulatory Visit (INDEPENDENT_AMBULATORY_CARE_PROVIDER_SITE_OTHER): Payer: Medicare Other | Admitting: Family Medicine

## 2016-01-28 ENCOUNTER — Encounter: Payer: Self-pay | Admitting: Family Medicine

## 2016-01-28 DIAGNOSIS — N400 Enlarged prostate without lower urinary tract symptoms: Secondary | ICD-10-CM | POA: Diagnosis not present

## 2016-01-28 DIAGNOSIS — I10 Essential (primary) hypertension: Secondary | ICD-10-CM | POA: Diagnosis not present

## 2016-01-28 DIAGNOSIS — Z23 Encounter for immunization: Secondary | ICD-10-CM

## 2016-01-28 DIAGNOSIS — M159 Polyosteoarthritis, unspecified: Secondary | ICD-10-CM

## 2016-01-28 DIAGNOSIS — R269 Unspecified abnormalities of gait and mobility: Secondary | ICD-10-CM

## 2016-01-28 DIAGNOSIS — M4712 Other spondylosis with myelopathy, cervical region: Secondary | ICD-10-CM | POA: Diagnosis not present

## 2016-01-28 DIAGNOSIS — N183 Chronic kidney disease, stage 3 unspecified: Secondary | ICD-10-CM

## 2016-01-28 LAB — CBC
HCT: 42 % (ref 38.5–50.0)
Hemoglobin: 14.4 g/dL (ref 13.2–17.1)
MCH: 32.9 pg (ref 27.0–33.0)
MCHC: 34.3 g/dL (ref 32.0–36.0)
MCV: 95.9 fL (ref 80.0–100.0)
MPV: 10.2 fL (ref 7.5–12.5)
PLATELETS: 212 10*3/uL (ref 140–400)
RBC: 4.38 MIL/uL (ref 4.20–5.80)
RDW: 14.8 % (ref 11.0–15.0)
WBC: 7.7 10*3/uL (ref 3.8–10.8)

## 2016-01-28 LAB — BASIC METABOLIC PANEL WITH GFR
BUN: 27 mg/dL — ABNORMAL HIGH (ref 7–25)
CALCIUM: 9.1 mg/dL (ref 8.6–10.3)
CO2: 22 mmol/L (ref 20–31)
CREATININE: 1.68 mg/dL — AB (ref 0.70–1.11)
Chloride: 106 mmol/L (ref 98–110)
GFR, EST AFRICAN AMERICAN: 43 mL/min — AB (ref >=60)
GFR, Est Non African American: 37 mL/min — ABNORMAL LOW (ref >=60)
Glucose, Bld: 79 mg/dL (ref 65–99)
Potassium: 4.5 mmol/L (ref 3.5–5.3)
SODIUM: 138 mmol/L (ref 135–146)

## 2016-01-28 NOTE — Assessment & Plan Note (Signed)
Gradual worsening.  Feel he would benefit from another round of PT.  At high risk for falls.

## 2016-01-28 NOTE — Assessment & Plan Note (Signed)
Stable

## 2016-01-28 NOTE — Assessment & Plan Note (Signed)
Stable from pain standpoint.  Contributes to gait.

## 2016-01-28 NOTE — Assessment & Plan Note (Addendum)
6 months since last creat check.  Worsened creat with high BUN to Creat ratio.  On HCTZ.  Discussed results with wife (early dementia makes him a poor historian.)  Two issues: 1. Does not drink much fluids.  "1 cup of coffee and two cokes per day." 2. Apparently complaining more about "his prostate."  Did not mention to me.   Will push fluids and recheck in 3 weeks.  Specifically check prostate/bladder outlet.

## 2016-01-28 NOTE — Patient Instructions (Signed)
I will call with the blood test results. The folks from physical therapy should call you to set up an appointment. You will get a flu shot today. Call me in one month to tell me how you are walking.  We can decide whether I should see you again or just refill your pain meds.

## 2016-01-28 NOTE — Assessment & Plan Note (Signed)
As per overview, avoid overtreatment.  No changes.

## 2016-01-28 NOTE — Progress Notes (Signed)
   Subjective:    Patient ID: Juan Li, male    DOB: December 21, 1931, 82 y.o.   MRN: [id number]  HPI Gait instability.  Chronic unsteady gait from old cervical myelopathy and osteoarthritis confounded by early dementia.  He has had a slow decline.  Continues to use cane.  No specific one-sided weakness.  No vertigo.  No orthostasis.  It has been ~ 1 year since last PT.  His pattern is that he improves gait with PT, then slowly declines without it.    Review of Systems     Objective:   Physical Exam VS noted Lungs clear Cardiac RRR without m or g Gait, broad-based, small steps, not fully upright.  Does not optimally use cane.        Assessment & Plan:

## 2016-01-29 ENCOUNTER — Other Ambulatory Visit: Payer: Self-pay | Admitting: Family Medicine

## 2016-01-29 NOTE — Assessment & Plan Note (Signed)
Followed by uro.  Apparently having more trouble.  Check at 3 week FU visit.

## 2016-02-02 ENCOUNTER — Ambulatory Visit (INDEPENDENT_AMBULATORY_CARE_PROVIDER_SITE_OTHER): Payer: Medicare Other

## 2016-02-02 DIAGNOSIS — I359 Nonrheumatic aortic valve disorder, unspecified: Secondary | ICD-10-CM

## 2016-02-02 DIAGNOSIS — Z952 Presence of prosthetic heart valve: Secondary | ICD-10-CM

## 2016-02-02 DIAGNOSIS — I350 Nonrheumatic aortic (valve) stenosis: Secondary | ICD-10-CM

## 2016-02-02 LAB — POCT INR: INR: 1.9

## 2016-02-04 ENCOUNTER — Ambulatory Visit: Payer: Medicare Other | Attending: Family Medicine | Admitting: Physical Therapy

## 2016-02-04 DIAGNOSIS — M6281 Muscle weakness (generalized): Secondary | ICD-10-CM | POA: Diagnosis not present

## 2016-02-04 DIAGNOSIS — R2689 Other abnormalities of gait and mobility: Secondary | ICD-10-CM | POA: Diagnosis not present

## 2016-02-04 DIAGNOSIS — R2681 Unsteadiness on feet: Secondary | ICD-10-CM | POA: Diagnosis not present

## 2016-02-04 NOTE — Therapy (Signed)
Juan Li [address] [place], [place], [address] Phone: [phone]   Fax:  [phone]  Physical Therapy Treatment  Patient Details  Name: Juan Li MRN: [id number] Date of Birth: 01-01-1932 Referring Provider: Madison Hickman, MD  Encounter Date: 02/04/2016      PT End of Session - 02/04/16 2131    Visit Number 1   Number of Visits 17   Date for PT Re-Evaluation 04/04/16   Authorization Type Medicare primary; [place] second   PT Start Time 0932   PT Stop Time 1016   PT Time Calculation (min) 44 min   Equipment Utilized During Treatment Gait belt   Activity Tolerance Patient tolerated treatment well   Behavior During Therapy [facility] for tasks assessed/performed      Past Medical History:  Diagnosis Date  . Arthritis 02-15-12   osteoarthritis,right knee, pain right wrist.,  . Complication of anesthesia 02-15-12   very confused, and very slow to awaken after anesthesia  . Coronary artery disease 02-15-12   Heart valve replaced   . Heart murmur   . Hypercholesterolemia 02-15-12  . Hypertension     Past Surgical History:  Procedure Laterality Date  . CARDIAC VALVE REPLACEMENT    . CORONARY ARTERY BYPASS GRAFT    . JOINT REPLACEMENT    . NECK SURGERY    . SHOULDER OPEN ROTATOR CUFF REPAIR  02-15-12   left  . TOTAL KNEE ARTHROPLASTY  02/20/2012   Procedure: TOTAL KNEE ARTHROPLASTY;  Surgeon: Gearlean Alf, MD;  Location: [facility];  Service: Orthopedics;  Laterality: Right;    There were no vitals filed for this visit.      Subjective Assessment - 02/04/16 0937    Subjective Pt is an 82 year old male who presents to OPPT upon recommendation of his family doctor.  He reports "gait getting worse and feel like I'm dragging my left foot."  Pt reports 2-3 falls in the past 6 months.  Falls have occurred with tripping with walking, and trying [place] put shirt on [place]  Pt  feels like uneven surfaces are difficult.  Reports balance issues 3-4 years.   Pertinent History cervical myelopathy, OA   Patient Stated Goals Pt's goal for therapy is to improve balance, get balance corrected.   Currently in Pain? No/denies            [facility] PT Assessment - 02/04/16 0940      Assessment   Medical Diagnosis gait instability   Referring Provider Madison Hickman, MD   Onset Date/Surgical Date 01/28/16     Precautions   Precautions Fall     Balance Screen   Has the patient fallen in the past 6 months Yes   How many times? 2-3   Has the patient had a decrease in activity level because of a fear of falling?  No   Is the patient reluctant to leave their home because of a fear of falling?  No     Home Ecologist residence   Living Arrangements Spouse/significant other   Available Help at Discharge Family   Type of [place] to enter   Entrance Stairs-Number of Steps 2   Entrance Stairs-Rails Right   Home Layout Multi-level;Bed/bath upstairs  2 flights of stairs   Alternate Level [place] - quad;Walker - 4 wheels;Other (comment)  Uses 4-wheeled RW  for longer distances     Prior Function   Level of Independence Independent with basic ADLs;Independent with household mobility with device;Independent with community mobility with device   Vocation Retired   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   Leisure Enjoys Haematologist, watching ball games     Observation/Other Assessments   Focus on Therapeutic Outcomes (FOTO)  NA-staff did not capture     Posture/Postural Control   Posture/Postural Control Postural limitations   Postural Limitations Forward head;Rounded Shoulders     ROM / Strength   AROM / PROM / Strength Strength     Strength   Overall Strength Deficits   Strength Assessment Site Hip;Knee;Ankle   Right/Left Hip Right;Left   Right Hip Flexion 4/5   Left Hip  Flexion 3+/5   Right/Left Knee Right;Left   Right Knee Flexion 4/5   Right Knee Extension 4/5   Left Knee Flexion 4/5   Left Knee Extension 4/5   Right/Left Ankle Right;Left   Right Ankle Dorsiflexion 3+/5   Left Ankle Dorsiflexion 3+/5     Transfers   Transfers Sit to Stand;Stand to Sit   Sit to Stand 6: Modified independent (Device/Increase time);With upper extremity assist;From chair/3-in-1   Stand to Sit 6: Modified independent (Device/Increase time);With upper extremity assist;To chair/3-in-1     Ambulation/Gait   Ambulation/Gait Yes   Ambulation/Gait Assistance 4: Min guard   Ambulation Distance (Feet) 100 Feet   Assistive device Small based quad cane   Gait Pattern Step-through pattern;Decreased step length - left;Decreased stance time - right;Decreased dorsiflexion - left;Right flexed knee in stance;Left flexed knee in stance;Left foot flat;Narrow base of support;Poor foot clearance - left;Poor foot clearance - right   Ambulation Surface Level;Indoor   Gait velocity 21.06 sec= 1.56 ft/sec      Standardized Balance Assessment   Standardized Balance Assessment Timed Up and Go Test;Berg Balance Test     Berg Balance Test   Sit to Stand Able to stand  independently using hands   Standing Unsupported Able to stand 2 minutes with supervision   Sitting with Back Unsupported but Feet Supported on Floor or Stool Able to sit safely and securely 2 minutes   Stand to Sit Controls descent by using hands   Transfers Able to transfer with verbal cueing and /or supervision   Standing Unsupported with Eyes Closed Able to stand 10 seconds with supervision   Standing Ubsupported with Feet Together Able to place feet together independently and stand for 1 minute with supervision   From Standing, Reach Forward with Outstretched Arm Can reach confidently >25 cm (10")   From Standing Position, Pick up Object from Floor Unable to try/needs assist to keep balance   From Standing Position, Turn to  Look Behind Over each Shoulder Looks behind one side only/other side shows less weight shift   Turn 360 Degrees Able to turn 360 degrees safely but slowly   Standing Unsupported, Alternately Place Feet on Step/Stool Able to complete >2 steps/needs minimal assist   Standing Unsupported, One Foot in Front Able to take small step independently and hold 30 seconds   Standing on One Leg Tries to lift leg/unable to hold 3 seconds but remains standing independently   Total Score 34   Berg comment: Scores < 45/56 indicate increased fall risk.     Timed Up and Go Test   Normal TUG (seconds) 29.1   TUG Comments Scores >13.5 sec are indicative of increased fall risk.  PT Education - 02/04/16 2129    Education provided Yes   Education Details Instructed in fall risk based on objective test scores, likely need to use RW for safety with gait due to Berg score of 34/56; instructed in proper quad cane use as pt using cane today at eval   Person(s) Educated Patient   Methods Explanation;Demonstration;Verbal cues   Comprehension Verbalized understanding;Returned demonstration;Verbal cues required          PT Short Term Goals - 02/04/16 2141      PT SHORT TERM GOAL #1   Title Pt will be independent with HEP for balance, gait, functional mobility.  TARGET 03/05/16   Time 4   Period Weeks   Status New     PT SHORT TERM GOAL #2   Title Pt will improve Berg Balance score to at least 39/56 for decreased fall risk.   Time 4   Period Weeks   Status New     PT SHORT TERM GOAL #3   Title Pt will improve TUG score to less than or equal to 25 seconds for decreased fall risk.   Time 4   Period Weeks   Status New     PT SHORT TERM GOAL #4   Title Pt will ambulate at least 250 ft using appropriate assistive device (RW vs. quad cane), modified independently, for improved safety with gait.   Time 4   Period Weeks   Status New           PT Long Term  Goals - 02/04/16 2147      PT LONG TERM GOAL #1   Title Pt will verbalize understanding of fall prevention within home environment.  TARGET 04/04/16   Time 9   Period Weeks   Status New     PT LONG TERM GOAL #2   Title Pt will improve Berg Balance score to at least 44/56 for decreased fall risk.   Time 9   Period Weeks   Status New     PT LONG TERM GOAL #3   Title Pt will improve TUG score to less than or equal to 20 seconds for decreased fall risk.   Time 9   Period Weeks   Status New     PT LONG TERM GOAL #4   Title Pt will improve gait velocity to at least 1.8 ft/sec for decreased fall risk and improved efficiency with gait.   Time 9   Period Weeks   Status New     PT LONG TERM GOAL #5   Title Pt ambulate at least 500 ft, indoor and outdoor surfaces, modified independently, with RW, for improved safety with gait.   Time 9   Period Weeks   Status New               Plan - 02/04/16 2133    Clinical Impression Statement Pt is an 82 year old male who presents to OP PT with decline in balance and gait, with several falls in the past 6 months.  He presents with decreased strength, decreased balance, decreased gait safety.  He is at fall risk per Berg, TUG, gait velocity scores.  Pt will benefit from skilled physical therapy to address the above stated deficits for decreased fall risk and imroved functional mobility.     Rehab Potential Good   PT Frequency 2x / week  1x/wk for 1 week, then   PT Duration 8 weeks   PT Treatment/Interventions ADLs/Self Care Home Management;Functional  mobility training;Gait training;DME Instruction;Therapeutic activities;Therapeutic exercise;Balance training;Neuromuscular re-education;Patient/family education   PT Next Visit Plan Initiate HEP to address strength, balance for HEP; gait training with RW versus quad cane      Patient will benefit from skilled therapeutic intervention in order to improve the following deficits and impairments:   Abnormal gait, Decreased balance, Decreased mobility, Decreased knowledge of use of DME, Decreased strength, Postural dysfunction  Visit Diagnosis: Other abnormalities of gait and mobility  Unsteadiness on feet  Muscle weakness (generalized)       G-Codes - 25-Feb-2016 2200    Functional Assessment Tool Used TUG 29.1 sec, gait velocity 1.56 ft/sec, Berg 34/56; 2-4 falls in the past 6 months   Functional Limitation Mobility: Walking and moving around   Mobility: Walking and Moving Around Current Status [phone]) At least 40 percent but less than 60 percent impaired, limited or restricted   Mobility: Walking and Moving Around Goal Status [phone]) At least 20 percent but less than 40 percent impaired, limited or restricted      Problem List Patient Active Problem List   Diagnosis Date Noted  . Cervical spondylosis with myelopathy 01/28/2016  . Metatarsalgia of left foot 07/30/2015  . Encounter for palliative care 11/21/2014  . Carotid artery stenosis 07/18/2014  . Stenosis of cervical spine region 07/18/2014  . Encounter for chronic pain management 04/09/2014  . Bradycardia 10/06/2013  . Unspecified vitamin D deficiency 11/30/2012  . Chronic diastolic CHF (congestive heart failure) ([place]) 08/21/2012  . OA (osteoarthritis) of knee 02/20/2012  . Gait abnormality 09/02/2011  . Aortic valve disorders 06/28/2010  . Long term (current) use of anticoagulants 06/28/2010  . S/P aortic valve replacement 06/28/2010  . AAA 01/20/2010  . HEART VALVE REPLACEMENT, HX OF 12/31/2008  . CKD (chronic kidney disease) stage 3, GFR 30-59 ml/min 08/06/2008  . Dementia 05/25/2007  . OSTEOARTHROSIS, GENERALIZED, MULTIPLE SITES 09/18/2006  . HYPERCHOLESTEROLEMIA 06/29/2006  . Gouty arthropathy 06/29/2006  . CARPAL TUNNEL SYNDROME 06/29/2006  . HYPERTENSION, BENIGN SYSTEMIC 06/29/2006  . GASTROESOPHAGEAL REFLUX, NO ESOPHAGITIS 06/29/2006  . BPH (benign prostatic hyperplasia) 06/29/2006  . ACTINIC  KERATOSIS 06/29/2006  . BACK PAIN, LOW 06/29/2006  . ROTATOR CUFF TENDONITIS 06/29/2006  . TOBACCO USE, QUIT 06/29/2006    MARRIOTT,AMY W. 2016/02/25, 10:02 PM  Frazier Butt., PT  [place] [address] [place], [place], [address] Phone: [phone]   Fax:  [phone]  Name: TULLY SAKAMOTO MRN: [id number] Date of Birth: [birth year]

## 2016-02-09 ENCOUNTER — Other Ambulatory Visit: Payer: Self-pay | Admitting: Cardiology

## 2016-02-10 ENCOUNTER — Ambulatory Visit: Payer: Medicare Other | Admitting: Physical Therapy

## 2016-02-10 DIAGNOSIS — M6281 Muscle weakness (generalized): Secondary | ICD-10-CM

## 2016-02-10 DIAGNOSIS — R2681 Unsteadiness on feet: Secondary | ICD-10-CM

## 2016-02-10 DIAGNOSIS — R2689 Other abnormalities of gait and mobility: Secondary | ICD-10-CM | POA: Diagnosis not present

## 2016-02-10 NOTE — Patient Instructions (Addendum)
Aerobic Exercise:  Bike: Ride for 10 min 5-6 days/week  Walking Program:  USE ROLLATOR  Begin walking for exercise for 3 minutes, 2 times/day, most days  Progress your walking program by adding 1-2 minutes to your routine each week, as tolerated. Be sure to wear good walking shoes, walk in a safe environment and only progress to your tolerance.

## 2016-02-10 NOTE — Therapy (Signed)
[facility] [place] [address] [place], [place], [address] Phone: [phone]   Fax:  [phone]  Physical Therapy Treatment  Patient Details  Name: Juan Li MRN: [id number] Date of Birth: 03-Sep-1931 Referring Provider: Madison Hickman, MD  Encounter Date: 02/10/2016      PT End of Session - 02/10/16 0926    Visit Number 2   Number of Visits 17   Date for PT Re-Evaluation 04/04/16   Authorization Type Medicare primary; [place] second   PT Start Time 0846   PT Stop Time 0924   PT Time Calculation (min) 38 min   Equipment Utilized During Treatment Gait belt   Activity Tolerance Patient tolerated treatment well   Behavior During Therapy [facility] for tasks assessed/performed      Past Medical History:  Diagnosis Date  . Arthritis 02-15-12   osteoarthritis,right knee, pain right wrist.,  . Complication of anesthesia 02-15-12   very confused, and very slow to awaken after anesthesia  . Coronary artery disease 02-15-12   Heart valve replaced   . Heart murmur   . Hypercholesterolemia 02-15-12  . Hypertension     Past Surgical History:  Procedure Laterality Date  . CARDIAC VALVE REPLACEMENT    . CORONARY ARTERY BYPASS GRAFT    . JOINT REPLACEMENT    . NECK SURGERY    . SHOULDER OPEN ROTATOR CUFF REPAIR  02-15-12   left  . TOTAL KNEE ARTHROPLASTY  02/20/2012   Procedure: TOTAL KNEE ARTHROPLASTY;  Surgeon: Gearlean Alf, MD;  Location: [facility];  Service: Orthopedics;  Laterality: Right;    There were no vitals filed for this visit.      Subjective Assessment - 02/10/16 0850    Subjective Pt has a treadmill and a seated bike.   Pertinent History cervical myelopathy, OA   Patient Stated Goals Pt's goal for therapy is to improve balance, get balance corrected.   Currently in Pain? No/denies                         [facility] Adult PT Treatment/Exercise - 02/10/16 0001      Ambulation/Gait   Ambulation/Gait Yes   Ambulation/Gait Assistance 5: Supervision   Ambulation/Gait Assistance Details Worked on activity tolerance and comparison with SBQC and rollator and edu on safety.   Ambulation Distance (Feet) 300 Feet  + 100 with rollator + 100 with SBQC   Assistive device Small based quad cane;4-wheeled walker   Gait Pattern Step-through pattern;Decreased step length - left;Decreased stance time - right;Decreased dorsiflexion - left;Right flexed knee in stance;Left flexed knee in stance;Left foot flat;Narrow base of support;Poor foot clearance - left;Poor foot clearance - right   Ambulation Surface Level;Indoor     Knee/Hip Exercises: Aerobic   Nustep Level 2, all extremities, speed: 70-80 steps/min, 5min continuously                PT Education - 02/10/16 0913    Education provided Yes   Education Details HEP for walking and seated bike program for aerobic activity. Recommendation for using RW in the home and rollator in the  community vs [facility].   Person(s) Educated Patient   Methods Explanation;Demonstration;Verbal cues;Handout   Comprehension Verbalized understanding;Returned demonstration;Need further instruction          PT Short Term Goals - 02/04/16 2141      PT SHORT TERM GOAL #1   Title Pt will be independent with HEP for balance, gait,  functional mobility.  TARGET 03/05/16   Time 4   Period Weeks   Status New     PT SHORT TERM GOAL #2   Title Pt will improve Berg Balance score to at least 39/56 for decreased fall risk.   Time 4   Period Weeks   Status New     PT SHORT TERM GOAL #3   Title Pt will improve TUG score to less than or equal to 25 seconds for decreased fall risk.   Time 4   Period Weeks   Status New     PT SHORT TERM GOAL #4   Title Pt will ambulate at least 250 ft using appropriate assistive device (RW vs. quad cane), modified independently, for improved safety with gait.   Time 4   Period Weeks   Status New           PT  Long Term Goals - 02/04/16 2147      PT LONG TERM GOAL #1   Title Pt will verbalize understanding of fall prevention within home environment.  TARGET 04/04/16   Time 9   Period Weeks   Status New     PT LONG TERM GOAL #2   Title Pt will improve Berg Balance score to at least 44/56 for decreased fall risk.   Time 9   Period Weeks   Status New     PT LONG TERM GOAL #3   Title Pt will improve TUG score to less than or equal to 20 seconds for decreased fall risk.   Time 9   Period Weeks   Status New     PT LONG TERM GOAL #4   Title Pt will improve gait velocity to at least 1.8 ft/sec for decreased fall risk and improved efficiency with gait.   Time 9   Period Weeks   Status New     PT LONG TERM GOAL #5   Title Pt ambulate at least 500 ft, indoor and outdoor surfaces, modified independently, with RW, for improved safety with gait.   Time 9   Period Weeks   Status New               Plan - 02/10/16 0859    Clinical Impression Statement Initiated HEP for walking program and aerobic activity in seated bike at home.  Pt able to complete gait training and exercise on Nustep well given cues for technique and safety.   Rehab Potential Good   PT Frequency 2x / week  1x/wk for 1 week, then   PT Duration 8 weeks   PT Treatment/Interventions ADLs/Self Care Home Management;Functional mobility training;Gait training;DME Instruction;Therapeutic activities;Therapeutic exercise;Balance training;Neuromuscular re-education;Patient/family education   PT Next Visit Plan Review HEP, add strength exercises for ankle DF, hip flexion and extension, standing balance.       Patient will benefit from skilled therapeutic intervention in order to improve the following deficits and impairments:  Abnormal gait, Decreased balance, Decreased mobility, Decreased knowledge of use of DME, Decreased strength, Postural dysfunction  Visit Diagnosis: Other abnormalities of gait and mobility  Unsteadiness  on feet  Muscle weakness (generalized)     Problem List Patient Active Problem List   Diagnosis Date Noted  . Cervical spondylosis with myelopathy 01/28/2016  . Metatarsalgia of left foot 07/30/2015  . Encounter for palliative care 11/21/2014  . Carotid artery stenosis 07/18/2014  . Stenosis of cervical spine region 07/18/2014  . Encounter for chronic pain management 04/09/2014  . Bradycardia 10/06/2013  .  Unspecified vitamin D deficiency 11/30/2012  . Chronic diastolic CHF (congestive heart failure) ([place]) 08/21/2012  . OA (osteoarthritis) of knee 02/20/2012  . Gait abnormality 09/02/2011  . Aortic valve disorders 06/28/2010  . Long term (current) use of anticoagulants 06/28/2010  . S/P aortic valve replacement 06/28/2010  . AAA 01/20/2010  . HEART VALVE REPLACEMENT, HX OF 12/31/2008  . CKD (chronic kidney disease) stage 3, GFR 30-59 ml/min 08/06/2008  . Dementia 05/25/2007  . OSTEOARTHROSIS, GENERALIZED, MULTIPLE SITES 09/18/2006  . HYPERCHOLESTEROLEMIA 06/29/2006  . Gouty arthropathy 06/29/2006  . CARPAL TUNNEL SYNDROME 06/29/2006  . HYPERTENSION, BENIGN SYSTEMIC 06/29/2006  . GASTROESOPHAGEAL REFLUX, NO ESOPHAGITIS 06/29/2006  . BPH (benign prostatic hyperplasia) 06/29/2006  . ACTINIC KERATOSIS 06/29/2006  . BACK PAIN, LOW 06/29/2006  . ROTATOR CUFF TENDONITIS 06/29/2006  . TOBACCO USE, QUIT 06/29/2006    Bjorn Loser, PTA  02/10/16, 9:31 AM [facility] [address] [place], [place], [address] Phone: [phone]   Fax:  [phone]  Name: MURRAY DAMEWOOD MRN: [id number] Date of Birth: 02/06/1932

## 2016-02-16 ENCOUNTER — Ambulatory Visit (INDEPENDENT_AMBULATORY_CARE_PROVIDER_SITE_OTHER): Payer: Medicare Other | Admitting: *Deleted

## 2016-02-16 DIAGNOSIS — I350 Nonrheumatic aortic (valve) stenosis: Secondary | ICD-10-CM

## 2016-02-16 DIAGNOSIS — Z952 Presence of prosthetic heart valve: Secondary | ICD-10-CM | POA: Diagnosis not present

## 2016-02-16 DIAGNOSIS — I359 Nonrheumatic aortic valve disorder, unspecified: Secondary | ICD-10-CM

## 2016-02-16 LAB — POCT INR: INR: 1.8

## 2016-02-17 ENCOUNTER — Ambulatory Visit: Payer: Medicare Other | Admitting: Physical Therapy

## 2016-02-17 DIAGNOSIS — R2681 Unsteadiness on feet: Secondary | ICD-10-CM

## 2016-02-17 DIAGNOSIS — R2689 Other abnormalities of gait and mobility: Secondary | ICD-10-CM | POA: Diagnosis not present

## 2016-02-17 DIAGNOSIS — M6281 Muscle weakness (generalized): Secondary | ICD-10-CM

## 2016-02-17 NOTE — Therapy (Signed)
[facility] [address] [place], [place], [address] Phone: [phone]   Fax:  [phone]  Physical Therapy Treatment  Patient Details  Name: Juan Li MRN: [id number] Date of Birth: [birth year] Referring Provider: Madison Hickman, MD  Encounter Date: 02/17/2016      PT End of Session - 02/17/16 1243    Visit Number 3   Number of Visits 17   Date for PT Re-Evaluation 04/04/16   Authorization Type Medicare primary; [place] second   PT Start Time 1148   PT Stop Time 1231   PT Time Calculation (min) 43 min   Equipment Utilized During Treatment Gait belt   Activity Tolerance Patient tolerated treatment well   Behavior During Therapy WFL for tasks assessed/performed      Past Medical History:  Diagnosis Date  . Arthritis 02-15-12   osteoarthritis,right knee, pain right wrist.,  . Complication of anesthesia 02-15-12   very confused, and very slow to awaken after anesthesia  . Coronary artery disease 02-15-12   Heart valve replaced   . Heart murmur   . Hypercholesterolemia 02-15-12  . Hypertension     Past Surgical History:  Procedure Laterality Date  . CARDIAC VALVE REPLACEMENT    . CORONARY ARTERY BYPASS GRAFT    . JOINT REPLACEMENT    . NECK SURGERY    . SHOULDER OPEN ROTATOR CUFF REPAIR  02-15-12   left  . TOTAL KNEE ARTHROPLASTY  02/20/2012   Procedure: TOTAL KNEE ARTHROPLASTY;  Surgeon: Gearlean Alf, MD;  Location: [facility];  Service: Orthopedics;  Laterality: Right;    There were no vitals filed for this visit.      Subjective Assessment - 02/17/16 1156    Subjective Pt comes in using SBQC today vs Rollator as instructed.  Pt states "i'm having a bad day."   Pertinent History cervical myelopathy, OA   Patient Stated Goals Pt's goal for therapy is to improve balance, get balance corrected.   Currently in Pain? No/denies           [facility] Adult PT Treatment/Exercise - 02/17/16 0001      Transfers   Transfers Sit to Stand;Stand to Sit   Sit to Stand 6: Modified independent (Device/Increase time);With upper extremity assist;From chair/3-in-1   Stand to Sit 6: Modified independent (Device/Increase time);With upper extremity assist;To chair/3-in-1   Number of Reps 10 reps;1 set   Comments provided as HEP-cues for full extension with standing     Ambulation/Gait   Ambulation/Gait Yes   Ambulation/Gait Assistance 5: Supervision;4: Min guard;4: Min assist   Ambulation Distance (Feet) 330 Feet  150' x 1;100' x 1   Assistive device Small based quad cane;Rollator   Gait Pattern Step-through pattern;Decreased step length - left;Decreased stance time - right;Decreased dorsiflexion - left;Right flexed knee in stance;Left flexed knee in stance;Left foot flat;Narrow base of support;Poor foot clearance - left;Poor foot clearance - right;Step-to pattern  forward head and trunk at times;   Ambulation Surface Level;Indoor   Gait Comments Pt needing cues to keep rollator close to pt and for heel strike, especially on L.  Tends to let trunk advance too far forward then feet shuffle/catch and cant get balanced.  Needed min assist x 4 during session to recover balance.  Discussed importance again of using RW indoors and Rollator outdoors to prevent falls and not using SBQC.     Exercises   Exercises Knee/Hip     Knee/Hip Exercises: Seated   Long Arc  Quad AROM;Strengthening;Both;1 set;10 reps;Limitations   Long [employer] Limitations cues for full extension on R side   Marching AROM;Strengthening;Both;1 set;15 reps;Other (comment)   Marching Limitations cues for maximum ROM   Sit to Sand 10 reps;with UE support     Ankle Exercises: Seated   Other Seated Ankle Exercises bil seated ankle dorsiflexion x 10 with red resistance band x 2 sets;pt able to return demo for home use.  Provided red theraband.                PT Education - 02/17/16 1242    Education provided Yes   Education  Details HEP provided today, using RW in home and rollator outside of home (vs [facility]) to decrease fall risk, proper use of Rollator, remaining seated or standing upon first getting up to allow dizziness to resolve if present.   Person(s) Educated Patient   Methods Explanation;Demonstration;Tactile cues;Verbal cues;Handout   Comprehension Verbalized understanding;Returned demonstration;Need further instruction          PT Short Term Goals - 02/04/16 2141      PT SHORT TERM GOAL #1   Title Pt will be independent with HEP for balance, gait, functional mobility.  TARGET 03/05/16   Time 4   Period Weeks   Status New     PT SHORT TERM GOAL #2   Title Pt will improve Berg Balance score to at least 39/56 for decreased fall risk.   Time 4   Period Weeks   Status New     PT SHORT TERM GOAL #3   Title Pt will improve TUG score to less than or equal to 25 seconds for decreased fall risk.   Time 4   Period Weeks   Status New     PT SHORT TERM GOAL #4   Title Pt will ambulate at least 250 ft using appropriate assistive device (RW vs. quad cane), modified independently, for improved safety with gait.   Time 4   Period Weeks   Status New           PT Long Term Goals - 02/04/16 2147      PT LONG TERM GOAL #1   Title Pt will verbalize understanding of fall prevention within home environment.  TARGET 04/04/16   Time 9   Period Weeks   Status New     PT LONG TERM GOAL #2   Title Pt will improve Berg Balance score to at least 44/56 for decreased fall risk.   Time 9   Period Weeks   Status New     PT LONG TERM GOAL #3   Title Pt will improve TUG score to less than or equal to 20 seconds for decreased fall risk.   Time 9   Period Weeks   Status New     PT LONG TERM GOAL #4   Title Pt will improve gait velocity to at least 1.8 ft/sec for decreased fall risk and improved efficiency with gait.   Time 9   Period Weeks   Status New     PT LONG TERM GOAL #5   Title Pt ambulate at  least 500 ft, indoor and outdoor surfaces, modified independently, with RW, for improved safety with gait.   Time 9   Period Weeks   Status New               Plan - 02/17/16 1243    Clinical Impression Statement Pt arrived using SBQC today vs Rollator as instructed on  previous session.  Pt stated that he didnt think he had to bring it to therapy.  Pt with several episodes of L foot catching today needing assist to balance.  Continues to be at risk for falls.  Pt c/o dizziness at times upon standing and discussed checking orthostatic BP's next session.  Continue PT per POC.   Rehab Potential Good   PT Frequency 2x / week  1x/wk for 1 week, then   PT Duration 8 weeks   PT Treatment/Interventions ADLs/Self Care Home Management;Functional mobility training;Gait training;DME Instruction;Therapeutic activities;Therapeutic exercise;Balance training;Neuromuscular re-education;Patient/family education   PT Next Visit Plan Check orthostatic BP's, Review HEP.  Consider adding standing exercises if safe/appropriate.    Consulted and Agree with Plan of Care Patient      Patient will benefit from skilled therapeutic intervention in order to improve the following deficits and impairments:  Abnormal gait, Decreased balance, Decreased mobility, Decreased knowledge of use of DME, Decreased strength, Postural dysfunction  Visit Diagnosis: Other abnormalities of gait and mobility  Unsteadiness on feet  Muscle weakness (generalized)     Problem List Patient Active Problem List   Diagnosis Date Noted  . Cervical spondylosis with myelopathy 01/28/2016  . Metatarsalgia of left foot 07/30/2015  . Encounter for palliative care 11/21/2014  . Carotid artery stenosis 07/18/2014  . Stenosis of cervical spine region 07/18/2014  . Encounter for chronic pain management 04/09/2014  . Bradycardia 10/06/2013  . Unspecified vitamin D deficiency 11/30/2012  . Chronic diastolic CHF (congestive heart  failure) ([place]) 08/21/2012  . OA (osteoarthritis) of knee 02/20/2012  . Gait abnormality 09/02/2011  . Aortic valve disorders 06/28/2010  . Long term (current) use of anticoagulants 06/28/2010  . S/P aortic valve replacement 06/28/2010  . AAA 01/20/2010  . HEART VALVE REPLACEMENT, HX OF 12/31/2008  . CKD (chronic kidney disease) stage 3, GFR 30-59 ml/min 08/06/2008  . Dementia 05/25/2007  . OSTEOARTHROSIS, GENERALIZED, MULTIPLE SITES 09/18/2006  . HYPERCHOLESTEROLEMIA 06/29/2006  . Gouty arthropathy 06/29/2006  . CARPAL TUNNEL SYNDROME 06/29/2006  . HYPERTENSION, BENIGN SYSTEMIC 06/29/2006  . GASTROESOPHAGEAL REFLUX, NO ESOPHAGITIS 06/29/2006  . BPH (benign prostatic hyperplasia) 06/29/2006  . ACTINIC KERATOSIS 06/29/2006  . BACK PAIN, LOW 06/29/2006  . ROTATOR CUFF TENDONITIS 06/29/2006  . TOBACCO USE, QUIT 06/29/2006    Narda Bonds 02/17/2016, 12:48 PM  [facility] [address] [place], [place], [address] Phone: [phone]   Fax:  [phone]  Name: Juan Li MRN: [id number] Date of Birth: [birth year]  [facility] [place], [place] 02/17/16 12:48 PM Phone: [phone] Fax: [phone]

## 2016-02-17 NOTE — Patient Instructions (Signed)
ANKLE: Dorsiflexion (Band)    Sit at edge of surface. Place red band around top of foot. Keep band in your opposite hand so it does not fall to the floor.  Keeping heel on floor, raise toes of banded foot. Do 10 times.  Switch the band to the other foot and do 10 on that side.  Do twice a day. Copyright  VHI. All rights reserved.     Functional Quadriceps: Sit to Stand    Sit on edge of chair, feet flat on floor. Stand upright, extending knees fully. Repeat 10 times.  Do twice a day.    http://orth.exer.us/734   Copyright  VHI. All rights reserved.   Sitting Quad Set    Tighten muscle in top of thigh and straighten out knee. Hold 3 seconds while counting out loud. Keep thigh on chair. Repeat with other leg. Repeat 10 times on each leg.  Do twice a day.  Remember to straighten knee all the way.  http://gt2.exer.us/443   Copyright  VHI. All rights reserved.   High Step - Seated    Sit with back straight, feet flat.  March in place raising knee as high as possible.  Alternate legs. Repeat 10 times each side.  Do twice a day.  Copyright  VHI. All rights reserved.

## 2016-02-18 ENCOUNTER — Ambulatory Visit: Payer: Medicare Other | Admitting: Physical Therapy

## 2016-02-18 DIAGNOSIS — R2689 Other abnormalities of gait and mobility: Secondary | ICD-10-CM

## 2016-02-18 DIAGNOSIS — R2681 Unsteadiness on feet: Secondary | ICD-10-CM

## 2016-02-18 DIAGNOSIS — M6281 Muscle weakness (generalized): Secondary | ICD-10-CM

## 2016-02-18 NOTE — Therapy (Signed)
[facility] [address] [place], [place], [address] Phone: [phone]   Fax:  [phone]  Physical Therapy Treatment  Patient Details  Name: Juan Li MRN: [id number] Date of Birth: April 26, 1932 Referring Provider: Madison Hickman, MD  Encounter Date: 02/18/2016      PT End of Session - 02/18/16 1522    Visit Number 4   Number of Visits 17   Date for PT Re-Evaluation 04/04/16   Authorization Type Medicare primary; [place] second   PT Start Time 1105   PT Stop Time 1146   PT Time Calculation (min) 41 min   Equipment Utilized During Treatment Gait belt   Activity Tolerance Patient tolerated treatment well   Behavior During Therapy WFL for tasks assessed/performed      Past Medical History:  Diagnosis Date  . Arthritis 02-15-12   osteoarthritis,right knee, pain right wrist.,  . Complication of anesthesia 02-15-12   very confused, and very slow to awaken after anesthesia  . Coronary artery disease 02-15-12   Heart valve replaced   . Heart murmur   . Hypercholesterolemia 02-15-12  . Hypertension     Past Surgical History:  Procedure Laterality Date  . CARDIAC VALVE REPLACEMENT    . CORONARY ARTERY BYPASS GRAFT    . JOINT REPLACEMENT    . NECK SURGERY    . SHOULDER OPEN ROTATOR CUFF REPAIR  02-15-12   left  . TOTAL KNEE ARTHROPLASTY  02/20/2012   Procedure: TOTAL KNEE ARTHROPLASTY;  Surgeon: Gearlean Alf, MD;  Location: [facility];  Service: Orthopedics;  Laterality: Right;    There were no vitals filed for this visit.      Subjective Assessment - 02/18/16 1110    Subjective Pt again come in using SBQC.  States "she didnt tell me to bring it in."  Pt forgot discussion on previous 2 sessions.   Pertinent History cervical myelopathy, OA   Patient Stated Goals Pt's goal for therapy is to improve balance, get balance corrected.   Currently in Pain? No/denies            [facility] Adult PT  Treatment/Exercise - 02/18/16 0001      Transfers   Transfers Sit to Stand;Stand to Sit   Sit to Stand 6: Modified independent (Device/Increase time);With upper extremity assist;From chair/3-in-1   Stand to Sit 6: Modified independent (Device/Increase time);With upper extremity assist;To chair/3-in-1     Ambulation/Gait   Ambulation/Gait Yes   Ambulation/Gait Assistance 5: Supervision;4: Min guard   Ambulation Distance (Feet) 120 Feet  x5   Assistive device Small based quad cane   Gait Pattern Step-through pattern;Decreased step length - left;Decreased stance time - right;Decreased dorsiflexion - left;Right flexed knee in stance;Left flexed knee in stance;Left foot flat;Narrow base of support;Poor foot clearance - left;Poor foot clearance - right;Step-to pattern   Ambulation Surface Level;Indoor   Gait Comments Pt again needs significant cues to increase step length bil and for heel strike.  Also discussed recommendation to use Rollator at all times when out in community and RW in house and not to use [facility] due to fall risk.  Pt able to verbalize.     Self-Care   Self-Care Other Self-Care Comments   Other Self-Care Comments  See vitals for orthostatic BP's.  Educated pt on orthostatic hypotension and need to remain seated or standing upon transitions to adjust to those changes.  Also discussed pt making f/u appointment with his cardiologist as per Epic note he has not  seen cardiologist since 12/2014 and MD had wanted him to return 06/2015.  Educated pt on Fall prevention strategies as well.     Knee/Hip Exercises: Aerobic   Nustep Scifit level 2.0 all 4 extremities x 9 minutes     Knee/Hip Exercises: Standing   Other Standing Knee Exercises Forward<>backward step weight shifts at counter x 20 with 1 UE support, bil hip abduction, hip extension and hip flexion-all x 10 with bil UE support of counter.                PT Education - 02/18/16 1521    Education provided Yes   Education  Details HEP, making f/u appointment with cardiologist per Epic note from 12/2014 cardiologist visit, using TW in home and rollator outside and not [facility], orthostatic hypotension, fall prevention   Person(s) Educated Patient   Methods Explanation;Demonstration;Verbal cues;Handout   Comprehension Verbalized understanding;Returned demonstration;Need further instruction          PT Short Term Goals - 02/04/16 2141      PT SHORT TERM GOAL #1   Title Pt will be independent with HEP for balance, gait, functional mobility.  TARGET 03/05/16   Time 4   Period Weeks   Status New     PT SHORT TERM GOAL #2   Title Pt will improve Berg Balance score to at least 39/56 for decreased fall risk.   Time 4   Period Weeks   Status New     PT SHORT TERM GOAL #3   Title Pt will improve TUG score to less than or equal to 25 seconds for decreased fall risk.   Time 4   Period Weeks   Status New     PT SHORT TERM GOAL #4   Title Pt will ambulate at least 250 ft using appropriate assistive device (RW vs. quad cane), modified independently, for improved safety with gait.   Time 4   Period Weeks   Status New           PT Long Term Goals - 02/04/16 2147      PT LONG TERM GOAL #1   Title Pt will verbalize understanding of fall prevention within home environment.  TARGET 04/04/16   Time 9   Period Weeks   Status New     PT LONG TERM GOAL #2   Title Pt will improve Berg Balance score to at least 44/56 for decreased fall risk.   Time 9   Period Weeks   Status New     PT LONG TERM GOAL #3   Title Pt will improve TUG score to less than or equal to 20 seconds for decreased fall risk.   Time 9   Period Weeks   Status New     PT LONG TERM GOAL #4   Title Pt will improve gait velocity to at least 1.8 ft/sec for decreased fall risk and improved efficiency with gait.   Time 9   Period Weeks   Status New     PT LONG TERM GOAL #5   Title Pt ambulate at least 500 ft, indoor and outdoor surfaces,  modified independently, with RW, for improved safety with gait.   Time 9   Period Weeks   Status New               Plan - 02/18/16 1523    Clinical Impression Statement Pt with decreased carryover relating to equipment use from one session to the next.  Continues with decreased  L foot clearance and decreased balance with gait.  Did present with orthostatic hypotension when vitals checked today during session.  Continue PT per POC.   Rehab Potential Good   PT Frequency 2x / week  1x/wk for 1 week, then   PT Duration 8 weeks   PT Treatment/Interventions ADLs/Self Care Home Management;Functional mobility training;Gait training;DME Instruction;Therapeutic activities;Therapeutic exercise;Balance training;Neuromuscular re-education;Patient/family education   PT Next Visit Plan Focus on gait, SLS activities, LE strengthening   Consulted and Agree with Plan of Care Patient      Patient will benefit from skilled therapeutic intervention in order to improve the following deficits and impairments:  Abnormal gait, Decreased balance, Decreased mobility, Decreased knowledge of use of DME, Decreased strength, Postural dysfunction  Visit Diagnosis: Other abnormalities of gait and mobility  Unsteadiness on feet  Muscle weakness (generalized)     Problem List Patient Active Problem List   Diagnosis Date Noted  . Cervical spondylosis with myelopathy 01/28/2016  . Metatarsalgia of left foot 07/30/2015  . Encounter for palliative care 11/21/2014  . Carotid artery stenosis 07/18/2014  . Stenosis of cervical spine region 07/18/2014  . Encounter for chronic pain management 04/09/2014  . Bradycardia 10/06/2013  . Unspecified vitamin D deficiency 11/30/2012  . Chronic diastolic CHF (congestive heart failure) ([place]) 08/21/2012  . OA (osteoarthritis) of knee 02/20/2012  . Gait abnormality 09/02/2011  . Aortic valve disorders 06/28/2010  . Long term (current) use of anticoagulants 06/28/2010   . S/P aortic valve replacement 06/28/2010  . AAA 01/20/2010  . HEART VALVE REPLACEMENT, HX OF 12/31/2008  . CKD (chronic kidney disease) stage 3, GFR 30-59 ml/min 08/06/2008  . Dementia 05/25/2007  . OSTEOARTHROSIS, GENERALIZED, MULTIPLE SITES 09/18/2006  . HYPERCHOLESTEROLEMIA 06/29/2006  . Gouty arthropathy 06/29/2006  . CARPAL TUNNEL SYNDROME 06/29/2006  . HYPERTENSION, BENIGN SYSTEMIC 06/29/2006  . GASTROESOPHAGEAL REFLUX, NO ESOPHAGITIS 06/29/2006  . BPH (benign prostatic hyperplasia) 06/29/2006  . ACTINIC KERATOSIS 06/29/2006  . BACK PAIN, LOW 06/29/2006  . ROTATOR CUFF TENDONITIS 06/29/2006  . TOBACCO USE, QUIT 06/29/2006    Narda Bonds 02/18/2016, 3:26 PM  [facility] [address] [place], [place], [address] Phone: [phone]   Fax:  [phone]  Name: Juan Li MRN: [id number] Date of Birth: 1931-07-13  Narda Bonds, [place] 02/18/16 3:26 PM Phone: [phone] Fax: [phone]

## 2016-02-18 NOTE — Patient Instructions (Addendum)
Call Dr Aundra Dubin to make an appointment with him.       It is important to avoid accidents which may result in broken bones.  Here are a few ideas on how to make your home safer so you will be less likely to trip or fall.  1. Use nonskid mats or non slip strips in your shower or tub, on your bathroom floor and around sinks.  If you know that you have spilled water, wipe it up! 2. In the bathroom, it is important to have properly installed grab bars on the walls or on the edge of the tub.  Towel racks are NOT strong enough for you to hold onto or to pull on for support. 3. Stairs and hallways should have enough light.  Add lamps or night lights if you need ore light. 4. It is good to have handrails on both sides of the stairs if possible.  Always fix broken handrails right away. 5. It is important to see the edges of steps.  Paint the edges of outdoor steps white so you can see them better.  Put colored tape on the edge of inside steps. 6. Throw-rugs are dangerous because they can slide.  Removing the rugs is the best idea, but if they must stay, add adhesive carpet tape to prevent slipping. 7. Do not keep things on stairs or in the halls.  Remove small furniture that blocks the halls as it may cause you to trip.  Keep telephone and electrical cords out of the way where you walk. 8. Always were sturdy, rubber-soled shoes for good support.  Never wear just socks, especially on the stairs.  Socks may cause you to slip or fall.  Do not wear full-length housecoats as you can easily trip on the bottom.  9. Place the things you use the most on the shelves that are the easiest to reach.  If you use a stepstool, make sure it is in good condition.  If you feel unsteady, DO NOT climb, ask for help. 10. If a health professional advises you to use a cane or walker, do not be ashamed.  These items can keep you from falling and breaking your bones.  Fall Prevention in the Home  Falls can cause injuries  and can affect people from all age groups. There are many simple things that you can do to make your home safe and to help prevent falls. WHAT CAN I DO ON THE OUTSIDE OF MY HOME?  Regularly repair the edges of walkways and driveways and fix any cracks.  Remove high doorway thresholds.  Trim any shrubbery on the main path into your home.  Use bright outdoor lighting.  Clear walkways of debris and clutter, including tools and rocks.  Regularly check that handrails are securely fastened and in good repair. Both sides of any steps should have handrails.  Install guardrails along the edges of any raised decks or porches.  Have leaves, snow, and ice cleared regularly.  Use sand or salt on walkways during winter months.  In the garage, clean up any spills right away, including grease or oil spills. WHAT CAN I DO IN THE BATHROOM?  Use night lights.  Install grab bars by the toilet and in the tub and shower. Do not use towel bars as grab bars.  Use non-skid mats or decals on the floor of the tub or shower.  If you need to sit down while you are in the shower, use  a plastic, non-slip stool.Marland Kitchen  Keep the floor dry. Immediately clean up any water that spills on the floor.  Remove soap buildup in the tub or shower on a regular basis.  Attach bath mats securely with double-sided non-slip rug tape.  Remove throw rugs and other tripping hazards from the floor. WHAT CAN I DO IN THE BEDROOM?  Use night lights.  Make sure that a bedside light is easy to reach.  Do not use oversized bedding that drapes onto the floor.  Have a firm chair that has side arms to use for getting dressed.  Remove throw rugs and other tripping hazards from the floor. WHAT CAN I DO IN THE KITCHEN?   Clean up any spills right away.  Avoid walking on wet floors.  Place frequently used items in easy-to-reach places.  If you need to reach for something above you, use a sturdy step stool that has a grab  bar.  Keep electrical cables out of the way.  Do not use floor polish or wax that makes floors slippery. If you have to use wax, make sure that it is non-skid floor wax.  Remove throw rugs and other tripping hazards from the floor. WHAT CAN I DO IN THE STAIRWAYS?  Do not leave any items on the stairs.  Make sure that there are handrails on both sides of the stairs. Fix handrails that are broken or loose. Make sure that handrails are as long as the stairways.  Check any carpeting to make sure that it is firmly attached to the stairs. Fix any carpet that is loose or worn.  Avoid having throw rugs at the top or bottom of stairways, or secure the rugs with carpet tape to prevent them from moving.  Make sure that you have a light switch at the top of the stairs and the bottom of the stairs. If you do not have them, have them installed. WHAT ARE SOME OTHER FALL PREVENTION TIPS?  Wear closed-toe shoes that fit well and support your feet. Wear shoes that have rubber soles or low heels.  When you use a stepladder, make sure that it is completely opened and that the sides are firmly locked. Have someone hold the ladder while you are using it. Do not climb a closed stepladder.  Add color or contrast paint or tape to grab bars and handrails in your home. Place contrasting color strips on the first and last steps.  Use mobility aids as needed, such as canes, walkers, scooters, and crutches.  Turn on lights if it is dark. Replace any light bulbs that burn out.  Set up furniture so that there are clear paths. Keep the furniture in the same spot.  Fix any uneven floor surfaces.  Choose a carpet design that does not hide the edge of steps of a stairway.  Be aware of any and all pets.  Review your medicines with your healthcare provider. Some medicines can cause dizziness or changes in blood pressure, which increase your risk of falling. Talk with your health care provider about other ways that  you can decrease your risk of falls. This may include working with a physical therapist or trainer to improve your strength, balance, and endurance.   This information is not intended to replace advice given to you by your health care provider. Make sure you discuss any questions you have with your health care provider.   Document Released: 04/08/2002 Document Revised: 09/02/2014 Document Reviewed: 05/23/2014 Elsevier Interactive Patient Education 2016 Elsevier  Inc.        EXTENSION: Standing (Active)    Hold counter or sink for support.  Stand, both feet flat. Draw right leg behind body as far as possible. Keep knee straight.  Alternate legs and do 10 times on each side. Do once a day.  http://gtsc.exer.us/76   Copyright  VHI. All rights reserved.   "I love a Contractor onto counter or sink for support.  March in place raising knees as high as possible.  Alternate legs and do 10 times on each leg once a day.  http://gt2.exer.us/344   Copyright  VHI. All rights reserved.   HIP: Abduction - Standing (Weight)    Hold counter or sink for support.  Raise leg out and slightly back. Alternate legs doing 10 times on each leg once a day.   Copyright  VHI. All rights reserved.

## 2016-02-23 ENCOUNTER — Ambulatory Visit: Payer: Medicare Other | Admitting: Physical Therapy

## 2016-02-23 DIAGNOSIS — M6281 Muscle weakness (generalized): Secondary | ICD-10-CM | POA: Diagnosis not present

## 2016-02-23 DIAGNOSIS — R2681 Unsteadiness on feet: Secondary | ICD-10-CM | POA: Diagnosis not present

## 2016-02-23 DIAGNOSIS — R2689 Other abnormalities of gait and mobility: Secondary | ICD-10-CM | POA: Diagnosis not present

## 2016-02-24 IMAGING — CR DG SHOULDER 2+V*L*
2 series · 2 of 2 positions shown · non-contrast
Comparison: 10/20/2012

CLINICAL DATA: Fell last night onto concrete

EXAM:
LEFT SHOULDER - 2+ VIEW

[shoulder grashey]
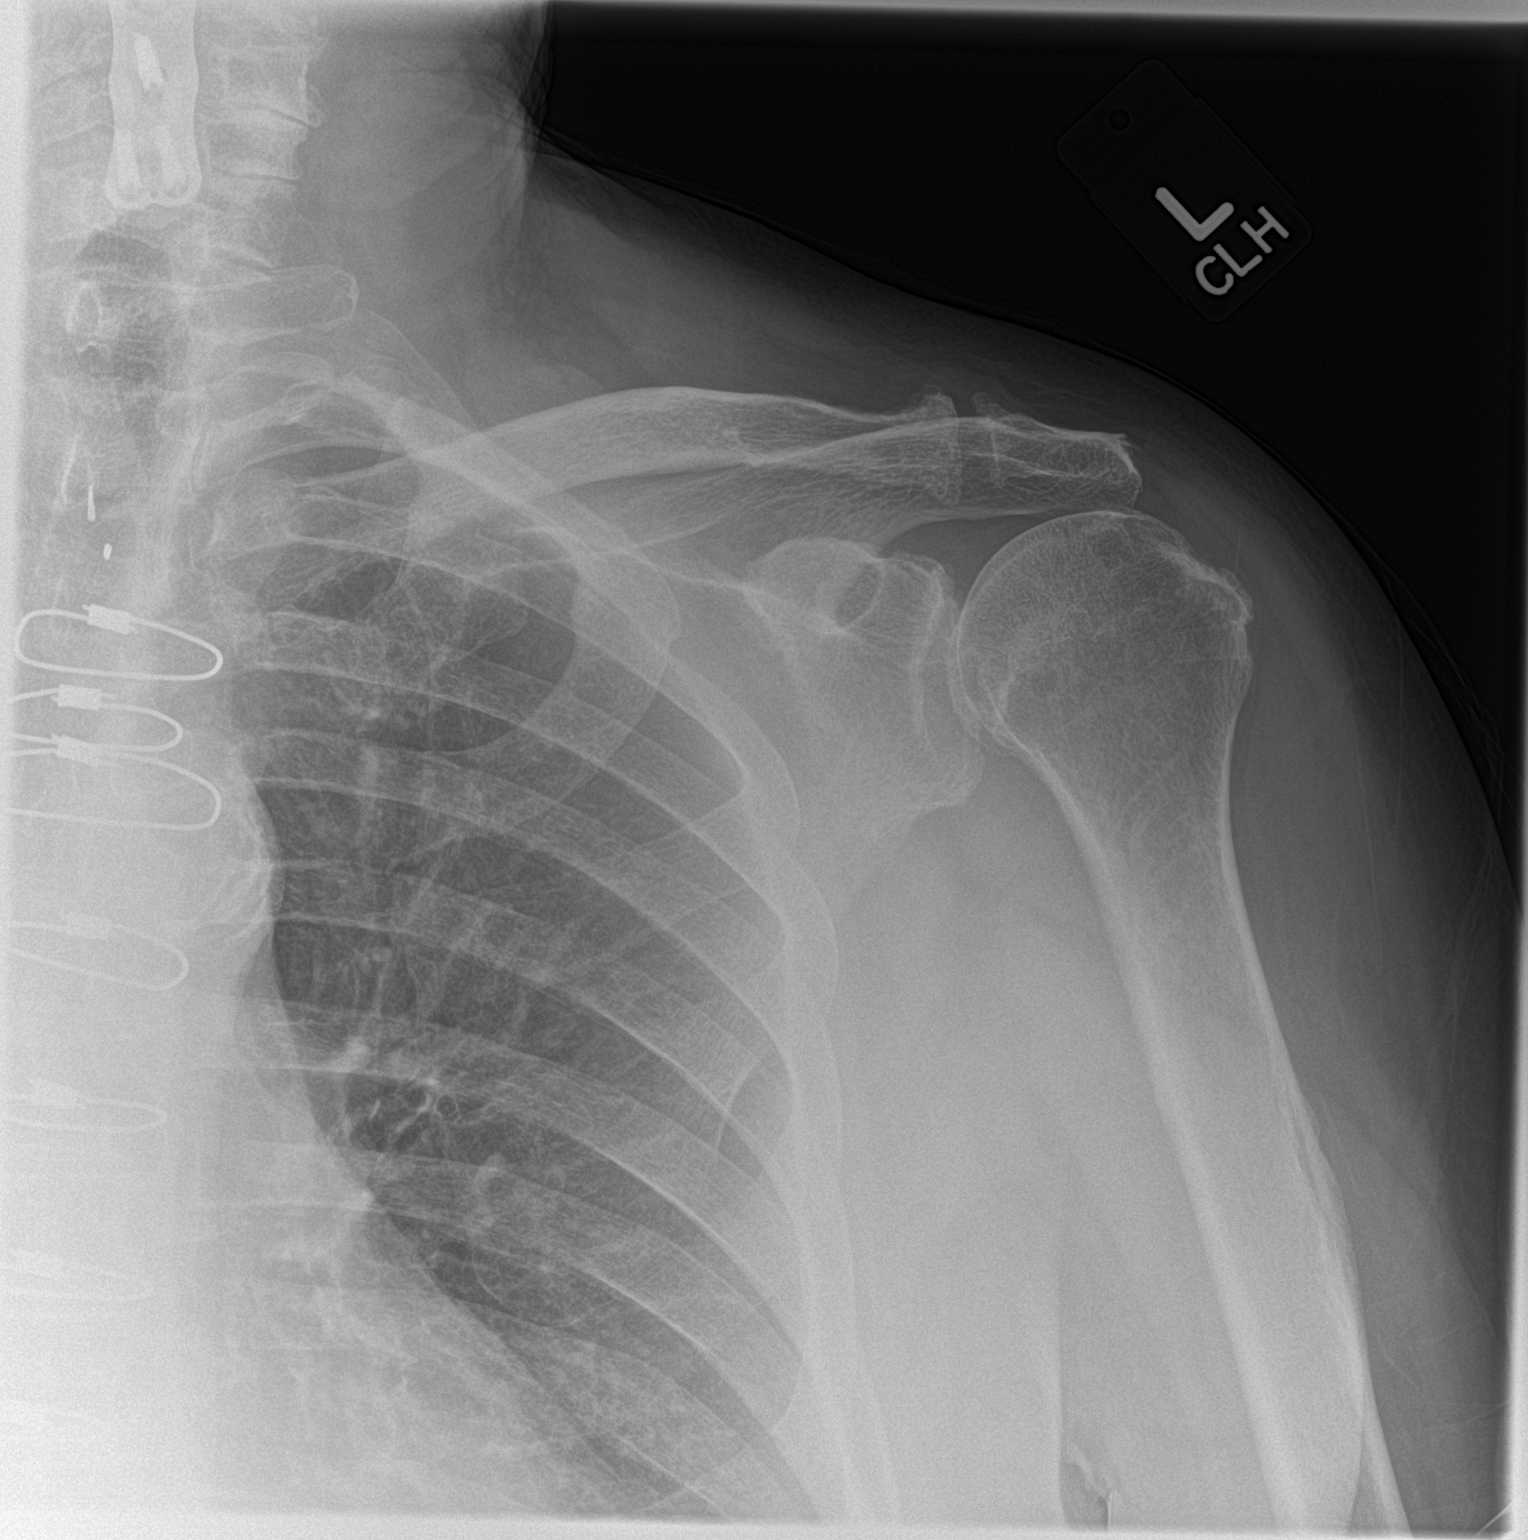

[shoulder y view]
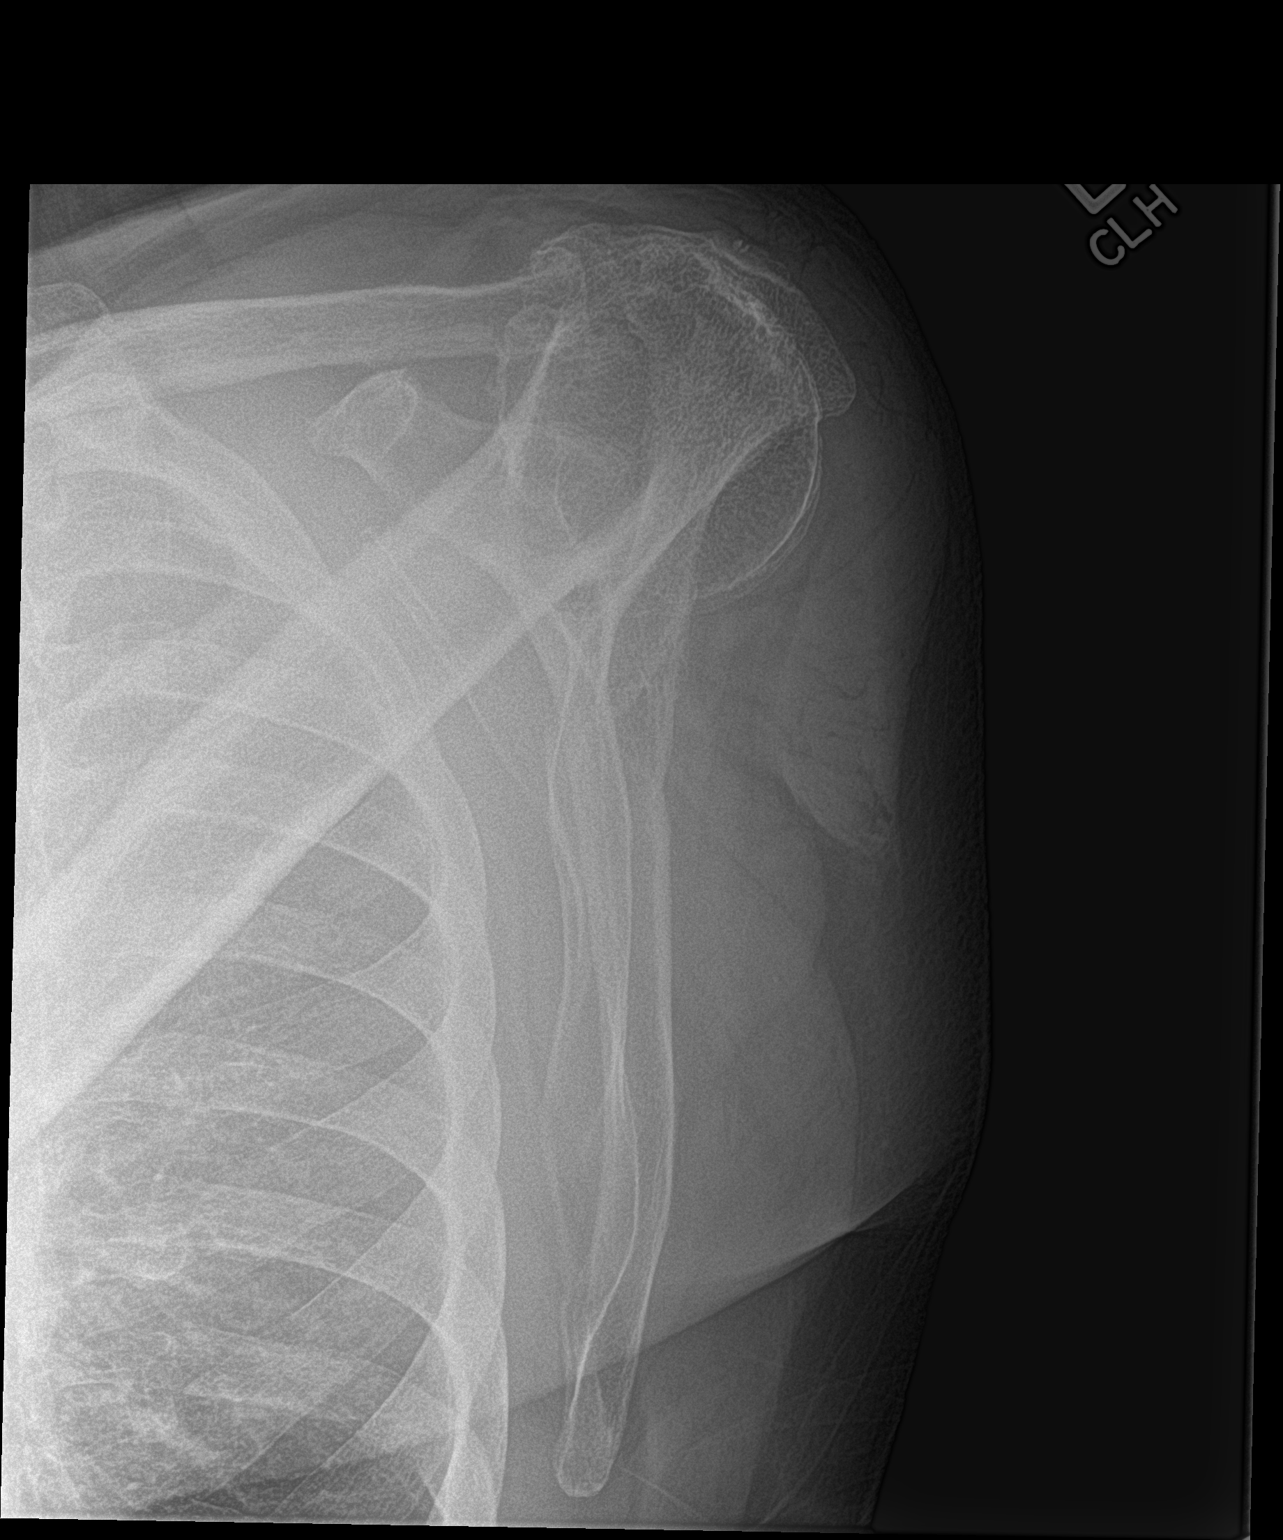

[2 of 2 positions shown; findings below may reference images not displayed]

FINDINGS: Negative for acute fracture or dislocation. Mild AC degenerative
changes are present. No bone lesion or bony destruction is evident.
IMPRESSION: Negative for acute fracture

## 2016-02-24 NOTE — Therapy (Signed)
[facility] [address] [place], [place], [address] Phone: [phone]   Fax:  [phone]  Physical Therapy Treatment  Patient Details  Name: Juan Li MRN: [id number] Date of Birth: 12/21/1931 Referring Provider: Madison Hickman, MD  Encounter Date: 02/23/2016      PT End of Session - 02/24/16 1657    Visit Number 5   Number of Visits 17   Date for PT Re-Evaluation 04/04/16   Authorization Type Medicare primary; [place] second   PT Start Time 1150   PT Stop Time 1233   PT Time Calculation (min) 43 min   Equipment Utilized During Treatment Gait belt   Activity Tolerance Patient tolerated treatment well   Behavior During Therapy WFL for tasks assessed/performed      Past Medical History:  Diagnosis Date  . Arthritis 02-15-12   osteoarthritis,right knee, pain right wrist.,  . Complication of anesthesia 02-15-12   very confused, and very slow to awaken after anesthesia  . Coronary artery disease 02-15-12   Heart valve replaced   . Heart murmur   . Hypercholesterolemia 02-15-12  . Hypertension     Past Surgical History:  Procedure Laterality Date  . CARDIAC VALVE REPLACEMENT    . CORONARY ARTERY BYPASS GRAFT    . JOINT REPLACEMENT    . NECK SURGERY    . SHOULDER OPEN ROTATOR CUFF REPAIR  02-15-12   left  . TOTAL KNEE ARTHROPLASTY  02/20/2012   Procedure: TOTAL KNEE ARTHROPLASTY;  Surgeon: Gearlean Alf, MD;  Location: [facility];  Service: Orthopedics;  Laterality: Right;    There were no vitals filed for this visit.      Subjective Assessment - 02/24/16 1651    Subjective Brought in my rollator walker today.  No reports of falls.   Pertinent History cervical myelopathy, OA   Patient Stated Goals Pt's goal for therapy is to improve balance, get balance corrected.   Currently in Pain? No/denies                         [facility] Adult PT Treatment/Exercise - 02/23/16 1204      Transfers   Transfers Sit to Stand;Stand to Sit   Sit to Stand 6: Modified independent (Device/Increase time);With upper extremity assist;From chair/3-in-1   Comments Multiple reps throughout session, with cues provided for scooting, forward lean and hand placement.     Ambulation/Gait   Ambulation/Gait Yes   Ambulation/Gait Assistance 5: Supervision;4: Min guard   Ambulation/Gait Assistance Details Needs frequent cueing to stay closer in BOS of rollator, to improve foot clearance and heelstrike   Ambulation Distance (Feet) 200 Feet  then 320   Assistive device Rollator   Gait Pattern Step-through pattern;Decreased step length - left;Decreased stance time - right;Decreased dorsiflexion - left;Right flexed knee in stance;Left flexed knee in stance;Left foot flat;Narrow base of support;Poor foot clearance - left;Poor foot clearance - right;Step-to pattern   Ambulation Surface Level;Indoor   Gait Comments Pt requires frequent cues for improved step length, foot clearanc, even consistent pacing with gait pattern.  He begins with improved step length, foot clearance, heelstrike with cues, but quickly reverts back to previous pattern.  Needs multiple cues to keep BOS of rollator walker close to him.     Therapeutic Exercise: Reviewed pt's current HEP-pt return demo understanding with pictures and min cues: -seated ankle dorsiflexion -LAQ, seated marching -Standing marching -Standing hip extension -Standing hip abduction  Cues provided for  posture with HEP  Neuro Re-education: -At counter:  Step and weightshifting activities to side, x 10 reps, forward x 10 reps, to back x 10 reps with UE support at counter -Side stepping along counter, 3 reps x 10 ft with UE support -Alternating step taps to 6" step, then to 12" step x 10 reps each with UE support  -PT provides close supervision and cues for upright posture              PT Short Term Goals - 02/04/16 2141      PT SHORT TERM  GOAL #1   Title Pt will be independent with HEP for balance, gait, functional mobility.  TARGET 03/05/16   Time 4   Period Weeks   Status New     PT SHORT TERM GOAL #2   Title Pt will improve Berg Balance score to at least 39/56 for decreased fall risk.   Time 4   Period Weeks   Status New     PT SHORT TERM GOAL #3   Title Pt will improve TUG score to less than or equal to 25 seconds for decreased fall risk.   Time 4   Period Weeks   Status New     PT SHORT TERM GOAL #4   Title Pt will ambulate at least 250 ft using appropriate assistive device (RW vs. quad cane), modified independently, for improved safety with gait.   Time 4   Period Weeks   Status New           PT Long Term Goals - 02/04/16 2147      PT LONG TERM GOAL #1   Title Pt will verbalize understanding of fall prevention within home environment.  TARGET 04/04/16   Time 9   Period Weeks   Status New     PT LONG TERM GOAL #2   Title Pt will improve Berg Balance score to at least 44/56 for decreased fall risk.   Time 9   Period Weeks   Status New     PT LONG TERM GOAL #3   Title Pt will improve TUG score to less than or equal to 20 seconds for decreased fall risk.   Time 9   Period Weeks   Status New     PT LONG TERM GOAL #4   Title Pt will improve gait velocity to at least 1.8 ft/sec for decreased fall risk and improved efficiency with gait.   Time 9   Period Weeks   Status New     PT LONG TERM GOAL #5   Title Pt ambulate at least 500 ft, indoor and outdoor surfaces, modified independently, with RW, for improved safety with gait.   Time 9   Period Weeks   Status New               Plan - 02/24/16 1658    Clinical Impression Statement Pt continues to need frequent cues for staying with walker BOS and for improved step length and foot clearance with gait.  Reviewed exercises today and pt able to return demo understanding of exercises.  No c/o dizziness while up today (did not check vitals).   Pt will continue to benefit from skilled physical therapy to address balance, posture, transfers, and gait.   Rehab Potential Good   PT Frequency 2x / week  1x/wk for 1 week, then   PT Duration 8 weeks   PT Treatment/Interventions ADLs/Self Care Home Management;Functional mobility training;Gait training;DME Instruction;Therapeutic activities;Therapeutic  exercise;Balance training;Neuromuscular re-education;Patient/family education   PT Next Visit Plan Standing balance, single limb stance, weightshifting and posture exercises; gait activities to reinforce step length and foot clearance.   Consulted and Agree with Plan of Care Patient      Patient will benefit from skilled therapeutic intervention in order to improve the following deficits and impairments:  Abnormal gait, Decreased balance, Decreased mobility, Decreased knowledge of use of DME, Decreased strength, Postural dysfunction  Visit Diagnosis: Other abnormalities of gait and mobility  Unsteadiness on feet  Muscle weakness (generalized)     Problem List Patient Active Problem List   Diagnosis Date Noted  . Cervical spondylosis with myelopathy 01/28/2016  . Metatarsalgia of left foot 07/30/2015  . Encounter for palliative care 11/21/2014  . Carotid artery stenosis 07/18/2014  . Stenosis of cervical spine region 07/18/2014  . Encounter for chronic pain management 04/09/2014  . Bradycardia 10/06/2013  . Unspecified vitamin D deficiency 11/30/2012  . Chronic diastolic CHF (congestive heart failure) ([place]) 08/21/2012  . OA (osteoarthritis) of knee 02/20/2012  . Gait abnormality 09/02/2011  . Aortic valve disorders 06/28/2010  . Long term (current) use of anticoagulants 06/28/2010  . S/P aortic valve replacement 06/28/2010  . AAA 01/20/2010  . HEART VALVE REPLACEMENT, HX OF 12/31/2008  . CKD (chronic kidney disease) stage 3, GFR 30-59 ml/min 08/06/2008  . Dementia 05/25/2007  . OSTEOARTHROSIS, GENERALIZED, MULTIPLE SITES  09/18/2006  . HYPERCHOLESTEROLEMIA 06/29/2006  . Gouty arthropathy 06/29/2006  . CARPAL TUNNEL SYNDROME 06/29/2006  . HYPERTENSION, BENIGN SYSTEMIC 06/29/2006  . GASTROESOPHAGEAL REFLUX, NO ESOPHAGITIS 06/29/2006  . BPH (benign prostatic hyperplasia) 06/29/2006  . ACTINIC KERATOSIS 06/29/2006  . BACK PAIN, LOW 06/29/2006  . ROTATOR CUFF TENDONITIS 06/29/2006  . TOBACCO USE, QUIT 06/29/2006    Margaux Engen W. 02/24/2016, 5:01 PM  Frazier Butt., PT  [facility] [address] [place], [place], [address] Phone: [phone]   Fax:  [phone]  Name: CACHE JACOBY MRN: [id number] Date of Birth: [birth year]

## 2016-02-25 ENCOUNTER — Ambulatory Visit: Payer: Medicare Other | Admitting: Physical Therapy

## 2016-02-25 DIAGNOSIS — R2689 Other abnormalities of gait and mobility: Secondary | ICD-10-CM | POA: Diagnosis not present

## 2016-02-25 DIAGNOSIS — R2681 Unsteadiness on feet: Secondary | ICD-10-CM

## 2016-02-25 DIAGNOSIS — M6281 Muscle weakness (generalized): Secondary | ICD-10-CM | POA: Diagnosis not present

## 2016-02-25 NOTE — Therapy (Signed)
[facility] [address] [place], [place], [address] Phone: [phone]   Fax:  [phone]  Physical Therapy Treatment  Patient Details  Name: Juan Li MRN: [id number] Date of Birth: January 08, 1932 Referring Provider: Madison Hickman, MD  Encounter Date: 02/25/2016      PT End of Session - 02/25/16 1156    Visit Number 6   Number of Visits 17   Date for PT Re-Evaluation 04/04/16   Authorization Type Medicare primary; [place] second   PT Start Time 1105   PT Stop Time 1145   PT Time Calculation (min) 40 min   Equipment Utilized During Treatment Gait belt   Activity Tolerance Patient tolerated treatment well   Behavior During Therapy WFL for tasks assessed/performed      Past Medical History:  Diagnosis Date  . Arthritis 02-15-12   osteoarthritis,right knee, pain right wrist.,  . Complication of anesthesia 02-15-12   very confused, and very slow to awaken after anesthesia  . Coronary artery disease 02-15-12   Heart valve replaced   . Heart murmur   . Hypercholesterolemia 02-15-12  . Hypertension     Past Surgical History:  Procedure Laterality Date  . CARDIAC VALVE REPLACEMENT    . CORONARY ARTERY BYPASS GRAFT    . JOINT REPLACEMENT    . NECK SURGERY    . SHOULDER OPEN ROTATOR CUFF REPAIR  02-15-12   left  . TOTAL KNEE ARTHROPLASTY  02/20/2012   Procedure: TOTAL KNEE ARTHROPLASTY;  Surgeon: Gearlean Alf, MD;  Location: [facility];  Service: Orthopedics;  Laterality: Right;    There were no vitals filed for this visit.      Subjective Assessment - 02/25/16 1105    Subjective Pt reports only using the rollator outside; in the house he does not use an AD, holds onto furniture.   Pertinent History cervical myelopathy, OA   Patient Stated Goals Pt's goal for therapy is to improve balance, get balance corrected.   Currently in Pain? No/denies                         [facility] Adult PT  Treatment/Exercise - 02/25/16 0001      Ambulation/Gait   Ambulation/Gait Yes   Ambulation/Gait Assistance 5: Supervision   Ambulation/Gait Assistance Details Working on staying close to walker, visual scanning, and foot clearance   Ambulation Distance (Feet) 345 Feet   Assistive device Rollator   Gait Pattern Step-through pattern;Decreased step length - left;Decreased stance time - right;Decreased dorsiflexion - left;Right flexed knee in stance;Left flexed knee in stance;Left foot flat;Narrow base of support;Poor foot clearance - left;Poor foot clearance - right;Step-to pattern   Ambulation Surface Level;Indoor     Knee/Hip Exercises: Standing   Hip Abduction Stengthening;Right;Left;1 set;10 reps   Hip Extension Stengthening;Right;Left;1 set;10 reps Cues for holding contraction and for technique.             Balance Exercises - 02/25/16 1109      Balance Exercises: Standing   Tandem Stance Eyes open;Upper extremity support 2;2 reps;30 secs   SLS Eyes open;Upper extremity support 2;3 reps   Sidestepping Upper extremity support;4 reps   Marching Limitations x4 with UE support   Heel Raises Limitations Walk x4 with UE support   Toe Raise Limitations Walk x4 with UE support           PT Education - 02/25/16 1200    Education provided Yes   Education Details  Balance HEP; recommend pt use at least [facility] for indoor gait due to fall risk (pt is currently not using an AD in the home).   Person(s) Educated Patient   Methods Explanation;Demonstration;Verbal cues;Handout   Comprehension Verbalized understanding          PT Short Term Goals - 02/04/16 2141      PT SHORT TERM GOAL #1   Title Pt will be independent with HEP for balance, gait, functional mobility.  TARGET 03/05/16   Time 4   Period Weeks   Status New     PT SHORT TERM GOAL #2   Title Pt will improve Berg Balance score to at least 39/56 for decreased fall risk.   Time 4   Period Weeks   Status New     PT  SHORT TERM GOAL #3   Title Pt will improve TUG score to less than or equal to 25 seconds for decreased fall risk.   Time 4   Period Weeks   Status New     PT SHORT TERM GOAL #4   Title Pt will ambulate at least 250 ft using appropriate assistive device (RW vs. quad cane), modified independently, for improved safety with gait.   Time 4   Period Weeks   Status New           PT Long Term Goals - 02/04/16 2147      PT LONG TERM GOAL #1   Title Pt will verbalize understanding of fall prevention within home environment.  TARGET 04/04/16   Time 9   Period Weeks   Status New     PT LONG TERM GOAL #2   Title Pt will improve Berg Balance score to at least 44/56 for decreased fall risk.   Time 9   Period Weeks   Status New     PT LONG TERM GOAL #3   Title Pt will improve TUG score to less than or equal to 20 seconds for decreased fall risk.   Time 9   Period Weeks   Status New     PT LONG TERM GOAL #4   Title Pt will improve gait velocity to at least 1.8 ft/sec for decreased fall risk and improved efficiency with gait.   Time 9   Period Weeks   Status New     PT LONG TERM GOAL #5   Title Pt ambulate at least 500 ft, indoor and outdoor surfaces, modified independently, with RW, for improved safety with gait.   Time 9   Period Weeks   Status New               Plan - 02/25/16 1157    Clinical Impression Statement Progressed HEP with balance exercises; pt requires 1-2 UEs for standing with narrow BOS.   Rehab Potential Good   PT Frequency 2x / week  1x/wk for 1 week, then   PT Duration 8 weeks   PT Treatment/Interventions ADLs/Self Care Home Management;Functional mobility training;Gait training;DME Instruction;Therapeutic activities;Therapeutic exercise;Balance training;Neuromuscular re-education;Patient/family education   PT Next Visit Plan Standing balance, single limb stance, weightshifting and posture exercises; gait activities to reinforce step length and foot  clearance.   Consulted and Agree with Plan of Care Patient      Patient will benefit from skilled therapeutic intervention in order to improve the following deficits and impairments:  Abnormal gait, Decreased balance, Decreased mobility, Decreased knowledge of use of DME, Decreased strength, Postural dysfunction  Visit Diagnosis: Other abnormalities of gait  and mobility  Unsteadiness on feet  Muscle weakness (generalized)     Problem List Patient Active Problem List   Diagnosis Date Noted  . Cervical spondylosis with myelopathy 01/28/2016  . Metatarsalgia of left foot 07/30/2015  . Encounter for palliative care 11/21/2014  . Carotid artery stenosis 07/18/2014  . Stenosis of cervical spine region 07/18/2014  . Encounter for chronic pain management 04/09/2014  . Bradycardia 10/06/2013  . Unspecified vitamin D deficiency 11/30/2012  . Chronic diastolic CHF (congestive heart failure) ([place]) 08/21/2012  . OA (osteoarthritis) of knee 02/20/2012  . Gait abnormality 09/02/2011  . Aortic valve disorders 06/28/2010  . Long term (current) use of anticoagulants 06/28/2010  . S/P aortic valve replacement 06/28/2010  . AAA 01/20/2010  . HEART VALVE REPLACEMENT, HX OF 12/31/2008  . CKD (chronic kidney disease) stage 3, GFR 30-59 ml/min 08/06/2008  . Dementia 05/25/2007  . OSTEOARTHROSIS, GENERALIZED, MULTIPLE SITES 09/18/2006  . HYPERCHOLESTEROLEMIA 06/29/2006  . Gouty arthropathy 06/29/2006  . CARPAL TUNNEL SYNDROME 06/29/2006  . HYPERTENSION, BENIGN SYSTEMIC 06/29/2006  . GASTROESOPHAGEAL REFLUX, NO ESOPHAGITIS 06/29/2006  . BPH (benign prostatic hyperplasia) 06/29/2006  . ACTINIC KERATOSIS 06/29/2006  . BACK PAIN, LOW 06/29/2006  . ROTATOR CUFF TENDONITIS 06/29/2006  . TOBACCO USE, QUIT 06/29/2006    Bjorn Loser, PTA  02/25/16, 12:06 PM [facility] [address] [place], [place], [address] Phone: [phone]    Fax:  [phone]  Name: KAEDAN DIGGLES MRN: [id number] Date of Birth: 07/17/1931

## 2016-02-25 NOTE — Patient Instructions (Signed)
Tandem Stance    Right foot in front of left, heel touching toe both feet "straight ahead".  Balance in this position _30__ seconds. Do with left foot in front of right x2  Copyright  VHI. All rights reserved.  SINGLE LIMB STANCE    Stance: single leg on floor. Raise leg. Hold _15__ seconds. Repeat with other leg. __2-3_ reps per set, _1-2x__  per day Copyright  VHI. All rights reserved.

## 2016-02-29 ENCOUNTER — Other Ambulatory Visit: Payer: Self-pay | Admitting: Cardiology

## 2016-02-29 ENCOUNTER — Ambulatory Visit (INDEPENDENT_AMBULATORY_CARE_PROVIDER_SITE_OTHER): Payer: Medicare Other

## 2016-02-29 DIAGNOSIS — I350 Nonrheumatic aortic (valve) stenosis: Secondary | ICD-10-CM | POA: Diagnosis not present

## 2016-02-29 DIAGNOSIS — Z952 Presence of prosthetic heart valve: Secondary | ICD-10-CM

## 2016-02-29 DIAGNOSIS — I359 Nonrheumatic aortic valve disorder, unspecified: Secondary | ICD-10-CM | POA: Diagnosis not present

## 2016-02-29 LAB — POCT INR: INR: 4

## 2016-03-01 NOTE — Telephone Encounter (Signed)
Dr Aundra Dubin does not prescribe this, I would suggest contact PCP.

## 2016-03-02 ENCOUNTER — Encounter: Payer: Self-pay | Admitting: Family Medicine

## 2016-03-02 ENCOUNTER — Ambulatory Visit (INDEPENDENT_AMBULATORY_CARE_PROVIDER_SITE_OTHER): Payer: Medicare Other | Admitting: Family Medicine

## 2016-03-02 DIAGNOSIS — N183 Chronic kidney disease, stage 3 unspecified: Secondary | ICD-10-CM

## 2016-03-02 LAB — BASIC METABOLIC PANEL WITH GFR
BUN: 25 mg/dL (ref 7–25)
CALCIUM: 9.5 mg/dL (ref 8.6–10.3)
CO2: 24 mmol/L (ref 20–31)
Chloride: 105 mmol/L (ref 98–110)
Creat: 1.48 mg/dL — ABNORMAL HIGH (ref 0.70–1.11)
GFR, EST AFRICAN AMERICAN: 50 mL/min — AB (ref >=60)
GFR, EST NON AFRICAN AMERICAN: 43 mL/min — AB (ref >=60)
GLUCOSE: 82 mg/dL (ref 65–99)
Potassium: 4.7 mmol/L (ref 3.5–5.3)
Sodium: 139 mmol/L (ref 135–146)

## 2016-03-02 NOTE — Patient Instructions (Signed)
I will call with the lab test results.   Use the walker all the time.  It is safer.

## 2016-03-03 ENCOUNTER — Ambulatory Visit: Payer: Medicare Other | Attending: Family Medicine | Admitting: Physical Therapy

## 2016-03-03 DIAGNOSIS — R2689 Other abnormalities of gait and mobility: Secondary | ICD-10-CM

## 2016-03-03 DIAGNOSIS — R2681 Unsteadiness on feet: Secondary | ICD-10-CM

## 2016-03-03 NOTE — Assessment & Plan Note (Signed)
Recheck creat is back to baseline.  Continue to encourage fluids throughout the day.  He and wife know to be intentional about this.

## 2016-03-03 NOTE — Therapy (Signed)
[facility] [address] [place], [place], [address] Phone: [phone]   Fax:  [phone]  Physical Therapy Treatment  Patient Details  Name: Juan Li MRN: [id number] Date of Birth: 07-30-1931 Referring Provider: Madison Hickman, MD  Encounter Date: 03/03/2016      PT End of Session - 03/03/16 1435    Visit Number 7   Number of Visits 17   Date for PT Re-Evaluation 04/04/16   Authorization Type Medicare primary; [place] second   PT Start Time 1108   PT Stop Time 1146   PT Time Calculation (min) 38 min   Equipment Utilized During Treatment Gait belt   Activity Tolerance Patient tolerated treatment well   Behavior During Therapy WFL for tasks assessed/performed      Past Medical History:  Diagnosis Date  . Arthritis 02-15-12   osteoarthritis,right knee, pain right wrist.,  . Complication of anesthesia 02-15-12   very confused, and very slow to awaken after anesthesia  . Coronary artery disease 02-15-12   Heart valve replaced   . Heart murmur   . Hypercholesterolemia 02-15-12  . Hypertension     Past Surgical History:  Procedure Laterality Date  . CARDIAC VALVE REPLACEMENT    . CORONARY ARTERY BYPASS GRAFT    . JOINT REPLACEMENT    . NECK SURGERY    . SHOULDER OPEN ROTATOR CUFF REPAIR  02-15-12   left  . TOTAL KNEE ARTHROPLASTY  02/20/2012   Procedure: TOTAL KNEE ARTHROPLASTY;  Surgeon: Gearlean Alf, MD;  Location: [facility];  Service: Orthopedics;  Laterality: Right;    There were no vitals filed for this visit.      Subjective Assessment - 03/03/16 1112    Subjective No falls recently.  Not sure I'm making progress with therapy.   Pertinent History cervical myelopathy, OA   Patient Stated Goals Pt's goal for therapy is to improve balance, get balance corrected.   Currently in Pain? No/denies                         [facility] Adult PT Treatment/Exercise - 03/03/16 1113      Ambulation/Gait   Ambulation/Gait Yes   Ambulation/Gait Assistance 5: Supervision   Ambulation/Gait Assistance Details Trialed foot-up brace on L LE, with slightly improved foot clearance, improved heelstrike and decreased episodes of L foot scuffing   Ambulation Distance (Feet) 345 Feet  then 115 ft x 2   Assistive device Rollator   Gait Pattern Step-through pattern;Decreased step length - left;Decreased stance time - right;Decreased dorsiflexion - left;Right flexed knee in stance;Left flexed knee in stance;Left foot flat;Narrow base of support;Poor foot clearance - left;Poor foot clearance - right;Step-to pattern   Ambulation Surface Level;Indoor   Gait Comments Cues today to stop and reset posture and bring walker closer to patient in intervals during gait to prevent excessive forward lean on walker.     Standardized Balance Assessment   Standardized Balance Assessment Berg Balance Test;Timed Up and Go Test     Berg Balance Test   Sit to Stand Able to stand without using hands and stabilize independently   Standing Unsupported Able to stand safely 2 minutes   Sitting with Back Unsupported but Feet Supported on Floor or Stool Able to sit safely and securely 2 minutes   Stand to Sit Sits safely with minimal use of hands   Transfers Able to transfer safely, minor use of hands   Standing Unsupported with  Eyes Closed Able to stand 10 seconds with supervision   Standing Ubsupported with Feet Together Able to place feet together independently but unable to hold for 30 seconds   From Standing, Reach Forward with Outstretched Arm Can reach forward >12 cm safely (5")   From Standing Position, Pick up Object from [place] to pick up shoe safely and easily   From Standing Position, Turn to Look Behind Over each Shoulder Looks behind from both sides and weight shifts well   Turn 360 Degrees Able to turn 360 degrees safely but slowly   Standing Unsupported, Alternately Place Feet on Step/Stool Able  to complete >2 steps/needs minimal assist   Standing Unsupported, One Foot in [place] to take small step independently and hold 30 seconds   Standing on One Leg Unable to try or needs assist to prevent fall   Total Score 41     Timed Up and Go Test   TUG Normal TUG   Normal TUG (seconds) 21.58  rollator; 23.13 sec no device           Discussed possibility of foot-up brace, and pt feels that it helps with L foot clearance.       PT Short Term Goals - 03/03/16 1112      PT SHORT TERM GOAL #1   Title Pt will be independent with HEP for balance, gait, functional mobility.  TARGET 03/05/16   Time 4   Period Weeks   Status New     PT SHORT TERM GOAL #2   Title Pt will improve Berg Balance score to at least 39/56 for decreased fall risk.   Baseline 41/56 03/03/16   Time 4   Period Weeks   Status Achieved     PT SHORT TERM GOAL #3   Title Pt will improve TUG score to less than or equal to 25 seconds for decreased fall risk.   Baseline 21.58 sec rollator 03/03/16   Time 4   Period Weeks   Status Achieved     PT SHORT TERM GOAL #4   Title Pt will ambulate at least 250 ft using appropriate assistive device (RW vs. quad cane), modified independently, for improved safety with gait.   Time 4   Period Weeks   Status Partially Met           PT Long Term Goals - 02/04/16 2147      PT LONG TERM GOAL #1   Title Pt will verbalize understanding of fall prevention within home environment.  TARGET 04/04/16   Time 9   Period Weeks   Status New     PT LONG TERM GOAL #2   Title Pt will improve Berg Balance score to at least 44/56 for decreased fall risk.   Time 9   Period Weeks   Status New     PT LONG TERM GOAL #3   Title Pt will improve TUG score to less than or equal to 20 seconds for decreased fall risk.   Time 9   Period Weeks   Status New     PT LONG TERM GOAL #4   Title Pt will improve gait velocity to at least 1.8 ft/sec for decreased fall risk and improved  efficiency with gait.   Time 9   Period Weeks   Status New     PT LONG TERM GOAL #5   Title Pt ambulate at least 500 ft, indoor and outdoor surfaces, modified independently, with RW, for improved safety  with gait.   Time 9   Period Weeks   Status New               Plan - 03/03/16 1435    Clinical Impression Statement Began assessing goals-pt has met STG for Berg, TUG and gait.  Pt appears to have slightly improved awareness of foot clearance with gait today, iwth additional improvement noted with use of foot-up brace.  Discussed progress towards goals, but patient does not feel that he is noticing functional improvements at home.   Rehab Potential Good   PT Frequency 2x / week  1x/wk for 1 week, then   PT Duration 8 weeks   PT Treatment/Interventions ADLs/Self Care Home Management;Functional mobility training;Gait training;DME Instruction;Therapeutic activities;Therapeutic exercise;Balance training;Neuromuscular re-education;Patient/family education   PT Next Visit Plan Check remaining STGs and discuss POC; try again foot-up brace    Consulted and Agree with Plan of Care Patient      Patient will benefit from skilled therapeutic intervention in order to improve the following deficits and impairments:  Abnormal gait, Decreased balance, Decreased mobility, Decreased knowledge of use of DME, Decreased strength, Postural dysfunction  Visit Diagnosis: Other abnormalities of gait and mobility  Unsteadiness on feet     Problem List Patient Active Problem List   Diagnosis Date Noted  . Cervical spondylosis with myelopathy 01/28/2016  . Metatarsalgia of left foot 07/30/2015  . Encounter for palliative care 11/21/2014  . Carotid artery stenosis 07/18/2014  . Stenosis of cervical spine region 07/18/2014  . Encounter for chronic pain management 04/09/2014  . Bradycardia 10/06/2013  . Unspecified vitamin D deficiency 11/30/2012  . Chronic diastolic CHF (congestive heart  failure) ([place]) 08/21/2012  . OA (osteoarthritis) of knee 02/20/2012  . Gait abnormality 09/02/2011  . Aortic valve disorders 06/28/2010  . Long term (current) use of anticoagulants 06/28/2010  . S/P aortic valve replacement 06/28/2010  . AAA 01/20/2010  . HEART VALVE REPLACEMENT, HX OF 12/31/2008  . CKD (chronic kidney disease) stage 3, GFR 30-59 ml/min 08/06/2008  . Dementia 05/25/2007  . OSTEOARTHROSIS, GENERALIZED, MULTIPLE SITES 09/18/2006  . HYPERCHOLESTEROLEMIA 06/29/2006  . Gouty arthropathy 06/29/2006  . CARPAL TUNNEL SYNDROME 06/29/2006  . HYPERTENSION, BENIGN SYSTEMIC 06/29/2006  . GASTROESOPHAGEAL REFLUX, NO ESOPHAGITIS 06/29/2006  . BPH (benign prostatic hyperplasia) 06/29/2006  . ACTINIC KERATOSIS 06/29/2006  . BACK PAIN, LOW 06/29/2006  . ROTATOR CUFF TENDONITIS 06/29/2006  . TOBACCO USE, QUIT 06/29/2006    Kaari Zeigler W. 03/03/2016, 2:39 PM Frazier Butt., PT [facility] [address] [place], [place], [address] Phone: [phone]   Fax:  [phone]  Name: Juan Li MRN: [id number] Date of Birth: 1932-03-04

## 2016-03-03 NOTE — Progress Notes (Signed)
   Subjective:    Patient ID: Juan Li, male    DOB: 1932-02-14, 82 y.o.   MRN: [id number]  HPI  Returns at my request because of mild bump in creat.  He has early dementia.  Did not mention urination problems last visit. My intervention was to encourage fluids. Today, states that he is urinating OK.  Feels like he is able to empty bladder completely.  Nocturia x 1.  Wife is with him and confirms the story.  She thinks he is taking both tamsulosin and finasteride and will verify when she gets home.  His chronic gait problems are unchanged.    Review of Systems     Objective:   Physical Exam Abd benign.  No suprapubic fullness No peripheral edema.       Assessment & Plan:

## 2016-03-04 ENCOUNTER — Ambulatory Visit: Payer: Medicare Other | Admitting: Physical Therapy

## 2016-03-04 DIAGNOSIS — R2689 Other abnormalities of gait and mobility: Secondary | ICD-10-CM

## 2016-03-04 DIAGNOSIS — R2681 Unsteadiness on feet: Secondary | ICD-10-CM

## 2016-03-04 NOTE — Therapy (Signed)
[facility] [address] [place], [place], [address] Phone: [phone]   Fax:  [phone]  Physical Therapy Treatment  Patient Details  Name: Juan Li MRN: [id number] Date of Birth: [birth year] Referring Provider: Madison Hickman, MD  Encounter Date: 03/04/2016      PT End of Session - 03/04/16 1230    Visit Number 8   Number of Visits 17   Date for PT Re-Evaluation 04/04/16   Authorization Type Medicare primary; [place] second   PT Start Time 1147   PT Stop Time 1217   PT Time Calculation (min) 30 min   Activity Tolerance Patient tolerated treatment well   Behavior During Therapy [facility] for tasks assessed/performed      Past Medical History:  Diagnosis Date  . Arthritis 02-15-12   osteoarthritis,right knee, pain right wrist.,  . Complication of anesthesia 02-15-12   very confused, and very slow to awaken after anesthesia  . Coronary artery disease 02-15-12   Heart valve replaced   . Heart murmur   . Hypercholesterolemia 02-15-12  . Hypertension     Past Surgical History:  Procedure Laterality Date  . CARDIAC VALVE REPLACEMENT    . CORONARY ARTERY BYPASS GRAFT    . JOINT REPLACEMENT    . NECK SURGERY    . SHOULDER OPEN ROTATOR CUFF REPAIR  02-15-12   left  . TOTAL KNEE ARTHROPLASTY  02/20/2012   Procedure: TOTAL KNEE ARTHROPLASTY;  Surgeon: Gearlean Alf, MD;  Location: [facility];  Service: Orthopedics;  Laterality: Right;    There were no vitals filed for this visit.      Subjective Assessment - 03/04/16 1152    Subjective Nothing new since last visit.  Wore my other shoes today.  I don't care to pursue the brace and I'd like the last visit to be today.  I don't feel like I've made much progress.   Pertinent History cervical myelopathy, OA   Patient Stated Goals Pt's goal for therapy is to improve balance, get balance corrected.   Currently in Pain? No/denies                          [facility] Adult PT Treatment/Exercise - 03/04/16 0001      Transfers   Transfers Sit to Stand;Stand to Sit   Sit to Stand 6: Modified independent (Device/Increase time);With upper extremity assist;From chair/3-in-1   Stand to Sit 6: Modified independent (Device/Increase time);With upper extremity assist;To chair/3-in-1   Number of Reps 10 reps   Comments Reviewed sit<>stand as part of HEP-pt return demo understanding     Ambulation/Gait   Ambulation/Gait Yes   Ambulation/Gait Assistance 5: Supervision   Ambulation Distance (Feet) 120 Feet  x 2; 100 ft   Assistive device Rollator   Gait Pattern Step-through pattern;Decreased step length - left;Decreased stance time - right;Decreased dorsiflexion - left;Right flexed knee in stance;Left flexed knee in stance;Left foot flat;Narrow base of support;Poor foot clearance - left;Poor foot clearance - right;Step-to pattern   Ambulation Surface Level;Indoor   Gait velocity 18.62 sec = 1.76 ft/sec   Gait Comments Continues to need cues today to keep rollator walker close to pt's BOS.  Pt declines to trial foot-up brace today, requesting he wants to discharge PT this visit and does not wish to pursue brace.     Self-Care   Self-Care Other Self-Care Comments   Other Self-Care Comments  Reviewed and discussed again fall prevention in the  home.  Discussed progress towards goals, but given that patient continues to be at fall risk per BERG and TUG scores, using rollator would be safest option for gait.     Exercises   Exercises Knee/Hip;Other Exercises   Other Exercises  Reviewed pt's HEP-seated ankle pumps, seated LAQs, seated marching; standing marching, standing hip abduction, standing hip extension.  Pt return demo understanding.  Unsure based on pt's responses, if he is performing at home.      Educated/reminded patient of stationary bike and walking program recommendation from earlier in POC-pt reports only  doing when he remembers to do those, not consistently.          PT Education - 03/04/16 1229    Education provided Yes   Education Details Reviewed fall prevention, discussed progress towards goals, discharge this visit per pt request   Person(s) Educated Patient   Methods Explanation;Handout   Comprehension Verbalized understanding          PT Short Term Goals - 03/04/16 1234      PT SHORT TERM GOAL #1   Title Pt will be independent with HEP for balance, gait, functional mobility.  TARGET 03/05/16   Time 4   Period Weeks   Status Achieved     PT SHORT TERM GOAL #2   Title Pt will improve Berg Balance score to at least 39/56 for decreased fall risk.   Baseline 41/56 03/03/16   Time 4   Period Weeks   Status Achieved     PT SHORT TERM GOAL #3   Title Pt will improve TUG score to less than or equal to 25 seconds for decreased fall risk.   Baseline 21.58 sec rollator 03/03/16   Time 4   Period Weeks   Status Achieved     PT SHORT TERM GOAL #4   Title Pt will ambulate at least 250 ft using appropriate assistive device (RW vs. quad cane), modified independently, for improved safety with gait.   Time 4   Period Weeks   Status Partially Met           PT Long Term Goals - 03/04/16 1234      PT LONG TERM GOAL #1   Title Pt will verbalize understanding of fall prevention within home environment.  TARGET 04/04/16   Time 9   Period Weeks   Status Achieved     PT LONG TERM GOAL #2   Title Pt will improve Berg Balance score to at least 44/56 for decreased fall risk.   Time 9   Period Weeks   Status Not Met     PT LONG TERM GOAL #3   Title Pt will improve TUG score to less than or equal to 20 seconds for decreased fall risk.   Time 9   Period Weeks   Status Not Met     PT LONG TERM GOAL #4   Title Pt will improve gait velocity to at least 1.8 ft/sec for decreased fall risk and improved efficiency with gait.   Time 9   Period Weeks   Status Not Met     PT  LONG TERM GOAL #5   Title Pt ambulate at least 500 ft, indoor and outdoor surfaces, modified independently, with RW, for improved safety with gait.   Time 9   Period Weeks   Status Not Met               Plan - 03/04/16 1230    Clinical Impression  Statement Pt has met all STGs.  LTGs not met, due to pt's request to discharge this visit; therefore LTGs unable to be fully assessed.  Pt wishes not to pursue foot-up brace and requests discharge this visit, because he feels therapy is not helping.  See discharge notes.   Rehab Potential Good   PT Frequency 2x / week  1x/wk for 1 week, then   PT Duration 8 weeks   PT Treatment/Interventions ADLs/Self Care Home Management;Functional mobility training;Gait training;DME Instruction;Therapeutic activities;Therapeutic exercise;Balance training;Neuromuscular re-education;Patient/family education   PT Next Visit Plan Discharge this visit per patient request.   Consulted and Agree with Plan of Care Patient      Patient will benefit from skilled therapeutic intervention in order to improve the following deficits and impairments:  Abnormal gait, Decreased balance, Decreased mobility, Decreased knowledge of use of DME, Decreased strength, Postural dysfunction  Visit Diagnosis: Unsteadiness on feet  Other abnormalities of gait and mobility       G-Codes - 2016-03-08 1234    Functional Assessment Tool Used TUG 21.58 sec with rollator; gait velocity 1.76 ft/sec; Merrilee Jansky 41/56   Functional Limitation Mobility: Walking and moving around   Mobility: Walking and Moving Around Goal Status [phone]) At least 20 percent but less than 40 percent impaired, limited or restricted   Mobility: Walking and Moving Around Discharge Status [phone]) At least 20 percent but less than 40 percent impaired, limited or restricted      Problem List Patient Active Problem List   Diagnosis Date Noted  . Cervical spondylosis with myelopathy 01/28/2016  . Metatarsalgia of  left foot 07/30/2015  . Encounter for palliative care 11/21/2014  . Carotid artery stenosis 07/18/2014  . Stenosis of cervical spine region 07/18/2014  . Encounter for chronic pain management 04/09/2014  . Bradycardia 10/06/2013  . Unspecified vitamin D deficiency 11/30/2012  . Chronic diastolic CHF (congestive heart failure) ([place]) 08/21/2012  . OA (osteoarthritis) of knee 02/20/2012  . Gait abnormality 09/02/2011  . Aortic valve disorders 06/28/2010  . Long term (current) use of anticoagulants 06/28/2010  . S/P aortic valve replacement 06/28/2010  . AAA 01/20/2010  . HEART VALVE REPLACEMENT, HX OF 12/31/2008  . CKD (chronic kidney disease) stage 3, GFR 30-59 ml/min 08/06/2008  . Dementia 05/25/2007  . OSTEOARTHROSIS, GENERALIZED, MULTIPLE SITES 09/18/2006  . HYPERCHOLESTEROLEMIA 06/29/2006  . Gouty arthropathy 06/29/2006  . CARPAL TUNNEL SYNDROME 06/29/2006  . HYPERTENSION, BENIGN SYSTEMIC 06/29/2006  . GASTROESOPHAGEAL REFLUX, NO ESOPHAGITIS 06/29/2006  . BPH (benign prostatic hyperplasia) 06/29/2006  . ACTINIC KERATOSIS 06/29/2006  . BACK PAIN, LOW 06/29/2006  . ROTATOR CUFF TENDONITIS 06/29/2006  . TOBACCO USE, QUIT 06/29/2006    Nikayla Madaris W. 03/08/16, 12:36 PM  Frazier Butt., PT  [facility] [address] [place], [place], [address] Phone: [phone]   Fax:  [phone]  Name: Juan Li MRN: [id number] Date of Birth: [birth year]  PHYSICAL THERAPY DISCHARGE SUMMARY  Visits from Start of Care: 8  Current functional level related to goals / functional outcomes:     PT Short Term Goals - 03-08-2016 1234      PT SHORT TERM GOAL #1   Title Pt will be independent with HEP for balance, gait, functional mobility.  TARGET 03/05/16   Time 4   Period Weeks   Status Achieved     PT SHORT TERM GOAL #2   Title Pt will improve Berg Balance score to at least 39/56 for decreased fall risk.   Baseline  41/56 03/03/16   Time 4   Period Weeks   Status Achieved     PT SHORT TERM GOAL #3   Title Pt will improve TUG score to less than or equal to 25 seconds for decreased fall risk.   Baseline 21.58 sec rollator 03/03/16   Time 4   Period Weeks   Status Achieved     PT SHORT TERM GOAL #4   Title Pt will ambulate at least 250 ft using appropriate assistive device (RW vs. quad cane), modified independently, for improved safety with gait.   Time 4   Period Weeks   Status Partially Met    Long term goals not fully addressed due to pt's request to discharge this visit.   Remaining deficits: Balance, gait, posture   Education / Equipment: Pt has been educated in HEP, fall prevention, with pt verbalizing/demo understanding.  Plan: Patient agrees to discharge.  Patient goals were not met. Patient is being discharged due to the patient's request.  ?????     Mady Haagensen, PT 03/04/16 12:38 PM Phone: [phone] Fax: [phone]

## 2016-03-04 NOTE — Patient Instructions (Addendum)
Fall Prevention in the Home   Falls can cause injuries. They can happen to people of all ages. There are many things you can do to make your home safe and to help prevent falls.   WHAT CAN I DO ON THE OUTSIDE OF MY HOME?  · Regularly fix the edges of walkways and driveways and fix any cracks.  · Remove anything that might make you trip as you walk through a door, such as a raised step or threshold.  · Trim any bushes or trees on the path to your home.  · Use bright outdoor lighting.  · Clear any walking paths of anything that might make someone trip, such as rocks or tools.  · Regularly check to see if handrails are loose or broken. Make sure that both sides of any steps have handrails.  · Any raised decks and porches should have guardrails on the edges.  · Have any leaves, snow, or ice cleared regularly.  · Use sand or salt on walking paths during winter.  · Clean up any spills in your garage right away. This includes oil or grease spills.  WHAT CAN I DO IN THE BATHROOM?   · Use night lights.  · Install grab bars by the toilet and in the tub and shower. Do not use towel bars as grab bars.  · Use non-skid mats or decals in the tub or shower.  · If you need to sit down in the shower, use a plastic, non-slip stool.  · Keep the floor dry. Clean up any water that spills on the floor as soon as it happens.  · Remove soap buildup in the tub or shower regularly.  · Attach bath mats securely with double-sided non-slip rug tape.  · Do not have throw rugs and other things on the floor that can make you trip.  WHAT CAN I DO IN THE BEDROOM?  · Use night lights.  · Make sure that you have a light by your bed that is easy to reach.  · Do not use any sheets or blankets that are too big for your bed. They should not hang down onto the floor.  · Have a firm chair that has side arms. You can use this for support while you get dressed.  · Do not have throw rugs and other things on the floor that can make you trip.  WHAT CAN I DO IN  THE KITCHEN?  · Clean up any spills right away.  · Avoid walking on wet floors.  · Keep items that you use a lot in easy-to-reach places.  · If you need to reach something above you, use a strong step stool that has a grab bar.  · Keep electrical cords out of the way.  · Do not use floor polish or wax that makes floors slippery. If you must use wax, use non-skid floor wax.  · Do not have throw rugs and other things on the floor that can make you trip.  WHAT CAN I DO WITH MY STAIRS?  · Do not leave any items on the stairs.  · Make sure that there are handrails on both sides of the stairs and use them. Fix handrails that are broken or loose. Make sure that handrails are as long as the stairways.  · Check any carpeting to make sure that it is firmly attached to the stairs. Fix any carpet that is loose or worn.  · Avoid having throw rugs at the top   or bottom of the stairs. If you do have throw rugs, attach them to the floor with carpet tape.  · Make sure that you have a light switch at the top of the stairs and the bottom of the stairs. If you do not have them, ask someone to add them for you.  WHAT ELSE CAN I DO TO HELP PREVENT FALLS?  · Wear shoes that:    Do not have high heels.    Have rubber bottoms.    Are comfortable and fit you well.    Are closed at the toe. Do not wear sandals.  · If you use a stepladder:    Make sure that it is fully opened. Do not climb a closed stepladder.    Make sure that both sides of the stepladder are locked into place.    Ask someone to hold it for you, if possible.  · Clearly mark and make sure that you can see:    Any grab bars or handrails.    First and last steps.    Where the edge of each step is.  · Use tools that help you move around (mobility aids) if they are needed. These include:    Canes.    Walkers.    Scooters.    Crutches.  · Turn on the lights when you go into a dark area. Replace any light bulbs as soon as they burn out.  · Set up your furniture so you have a clear  path. Avoid moving your furniture around.  · If any of your floors are uneven, fix them.  · If there are any pets around you, be aware of where they are.  · Review your medicines with your doctor. Some medicines can make you feel dizzy. This can increase your chance of falling.  Ask your doctor what other things that you can do to help prevent falls.     This information is not intended to replace advice given to you by your health care provider. Make sure you discuss any questions you have with your health care provider.     Document Released: 02/12/2009 Document Revised: 09/02/2014 Document Reviewed: 05/23/2014  Elsevier Interactive Patient Education ©2016 [employer] Inc[employer]

## 2016-03-06 ENCOUNTER — Other Ambulatory Visit: Payer: Self-pay | Admitting: Cardiology

## 2016-03-07 ENCOUNTER — Ambulatory Visit: Payer: Medicare Other | Admitting: Physical Therapy

## 2016-03-08 ENCOUNTER — Other Ambulatory Visit: Payer: Self-pay | Admitting: Cardiology

## 2016-03-08 NOTE — Telephone Encounter (Signed)
tamsulosin (FLOMAX) 0.4 MG CAPS capsule  Medication  Date: 02/09/2016 Department: [address] Office Ordering/Authorizing: Larey Dresser, MD  Order Providers   Prescribing Provider Encounter Provider  Larey Dresser, MD Larey Dresser, MD  Medication Detail    Disp Refills Start End   tamsulosin [facility]) 0.4 MG CAPS capsule 15 capsule 0 02/09/2016    Sig: TAKE 1 CAPSULE EVERY DAY   Notes to Pharmacy: Please call our office to schedule an over due yearly appointment before anymore refills. [phone]. Thank you 2nd attempt   E-Prescribing Status: Receipt confirmed by pharmacy (02/09/2016 4:05 PM EDT)   Pharmacy   [id number] - [place], [place].

## 2016-03-10 ENCOUNTER — Ambulatory Visit (INDEPENDENT_AMBULATORY_CARE_PROVIDER_SITE_OTHER): Payer: Medicare Other | Admitting: Pharmacist

## 2016-03-10 DIAGNOSIS — Z952 Presence of prosthetic heart valve: Secondary | ICD-10-CM | POA: Diagnosis not present

## 2016-03-10 LAB — POCT INR: INR: 1.8

## 2016-03-14 ENCOUNTER — Other Ambulatory Visit: Payer: Self-pay | Admitting: Family Medicine

## 2016-03-14 MED ORDER — TAMSULOSIN HCL 0.4 MG PO CAPS: 0.4000 mg | ORAL_CAPSULE | Freq: Every day | ORAL | 3 refills | Status: DC

## 2016-03-14 NOTE — Telephone Encounter (Signed)
pPt needs a refill on Flomax, pt needs 15 or so enough to get pt through until next visit with the cardiologist (Nov. 28th). [facility] [address] Please advise. Thanks! ep

## 2016-03-18 ENCOUNTER — Encounter: Payer: Self-pay | Admitting: Physician Assistant

## 2016-03-21 ENCOUNTER — Ambulatory Visit (INDEPENDENT_AMBULATORY_CARE_PROVIDER_SITE_OTHER): Payer: Medicare Other

## 2016-03-21 DIAGNOSIS — Z952 Presence of prosthetic heart valve: Secondary | ICD-10-CM | POA: Diagnosis not present

## 2016-03-21 LAB — POCT INR: INR: 1.7

## 2016-03-29 ENCOUNTER — Ambulatory Visit (INDEPENDENT_AMBULATORY_CARE_PROVIDER_SITE_OTHER): Payer: Medicare Other | Admitting: Physician Assistant

## 2016-03-29 ENCOUNTER — Ambulatory Visit (INDEPENDENT_AMBULATORY_CARE_PROVIDER_SITE_OTHER): Payer: Medicare Other | Admitting: Pharmacist

## 2016-03-29 ENCOUNTER — Encounter: Payer: Self-pay | Admitting: Physician Assistant

## 2016-03-29 VITALS — BP 122/58 | HR 56 | Ht 66.0 in | Wt 182.8 lb

## 2016-03-29 DIAGNOSIS — I359 Nonrheumatic aortic valve disorder, unspecified: Secondary | ICD-10-CM

## 2016-03-29 DIAGNOSIS — R42 Dizziness and giddiness: Secondary | ICD-10-CM | POA: Diagnosis not present

## 2016-03-29 DIAGNOSIS — R001 Bradycardia, unspecified: Secondary | ICD-10-CM | POA: Diagnosis not present

## 2016-03-29 DIAGNOSIS — I739 Peripheral vascular disease, unspecified: Secondary | ICD-10-CM

## 2016-03-29 DIAGNOSIS — E7800 Pure hypercholesterolemia, unspecified: Secondary | ICD-10-CM

## 2016-03-29 DIAGNOSIS — I5032 Chronic diastolic (congestive) heart failure: Secondary | ICD-10-CM

## 2016-03-29 DIAGNOSIS — Z952 Presence of prosthetic heart valve: Secondary | ICD-10-CM

## 2016-03-29 DIAGNOSIS — I779 Disorder of arteries and arterioles, unspecified: Secondary | ICD-10-CM

## 2016-03-29 DIAGNOSIS — I10 Essential (primary) hypertension: Secondary | ICD-10-CM

## 2016-03-29 LAB — CBC
HEMATOCRIT: 44.6 % (ref 38.5–50.0)
HEMOGLOBIN: 15 g/dL (ref 13.2–17.1)
MCH: 32 pg (ref 27.0–33.0)
MCHC: 33.6 g/dL (ref 32.0–36.0)
MCV: 95.1 fL (ref 80.0–100.0)
MPV: 10.5 fL (ref 7.5–12.5)
Platelets: 214 10*3/uL (ref 140–400)
RBC: 4.69 MIL/uL (ref 4.20–5.80)
RDW: 14.3 % (ref 11.0–15.0)
WBC: 8.4 10*3/uL (ref 3.8–10.8)

## 2016-03-29 LAB — BASIC METABOLIC PANEL
BUN: 30 mg/dL — AB (ref 7–25)
CHLORIDE: 107 mmol/L (ref 98–110)
CO2: 23 mmol/L (ref 20–31)
Calcium: 9.4 mg/dL (ref 8.6–10.3)
Creat: 1.63 mg/dL — ABNORMAL HIGH (ref 0.70–1.11)
GLUCOSE: 83 mg/dL (ref 65–99)
POTASSIUM: 4.9 mmol/L (ref 3.5–5.3)
SODIUM: 139 mmol/L (ref 135–146)

## 2016-03-29 LAB — POCT INR: INR: 3

## 2016-03-29 MED ORDER — METOPROLOL TARTRATE 25 MG PO TABS: 12.5000 mg | ORAL_TABLET | Freq: Two times a day (BID) | ORAL | 3 refills | Status: DC

## 2016-03-29 MED ORDER — HYDROCHLOROTHIAZIDE 25 MG PO TABS: 12.5000 mg | ORAL_TABLET | Freq: Every day | ORAL | 3 refills | Status: DC

## 2016-03-29 NOTE — Patient Instructions (Addendum)
Medication Instructions:  1. DECREASE METOPROLOL TARTRATE TO 12.5 MG TWICE DAILY (THIS WILL BE 1/2 TABLET OF THE 25 MG TABLET TWICE DAILY) 2. DECREASE HCTZ TO 12.5 MG DAILY (THIS WILL BE 1/2 TABLET DAILY OF THE 25 MG TABLET)  Labwork: TODAY BMET, CBC   Testing/Procedures: 1. Your physician has requested that you have an echocardiogram. Echocardiography is a painless test that uses sound waves to create images of your heart. It provides your doctor with information about the size and shape of your heart and how well your heart's chambers and valves are working. This procedure takes approximately one hour. There are no restrictions for this procedure.  2. Your physician has recommended that you wear an event monitor. Event monitors are medical devices that record the heart's electrical activity. Doctors most often [place] these monitors to diagnose arrhythmias. Arrhythmias are problems with the speed or rhythm of the heartbeat. The monitor is a small, portable device. You can wear one while you do your normal daily activities. This is usually used to diagnose what is causing palpitations/syncope (passing out).  Follow-Up: SCOTT WEAVER, PAC 2-3 WEEKS IF POSSIBLE SAME DAY DR. Aundra Dubin IS IN THE OFFICE  Any Other Special Instructions Will Be Listed Below (If Applicable).  If you need a refill on your cardiac medications before your next appointment, please call your pharmacy.

## 2016-03-29 NOTE — Progress Notes (Signed)
Cardiology Office Note:    Date:  03/29/2016   ID:  DAQUANTE Juan Li, DOB [birth year], MRN [id number]  PCP:  Zigmund Gottron, MD  Cardiologist:  Dr. [name]   Electrophysiologist:  n/a  Referring MD: Zenia Resides, MD   Chief Complaint  Patient presents with  . Dizziness    History of Present Illness:    Juan Li is a 82 y.o. male with a hx of aortic stenosis s/p SJ (mechanical) AVR in [id number], diastolic HF, carotid artery disease, dementia.  Holter was done in 6/15 due to bradycardia and this showed NSVT (17 beats) and short runs of ATach.  His beta-blocker could not be increased due to bradycardia.  Nuclear stress test showed no ischemia.  Carotid US in 0000000 showed > 123XX123 LICA stenosis. This is asymptomatic and he has been treated conservatively.  Last seen by Dr. [name] in 8/16.    He returns for the evaluation of dizziness.  He has felt dizzy for the last 6-8 months. He has fallen a couple of times.  He seems to describe losing his balance.  He denies frank syncope.  He does not feel near syncopal.  He notes more significant dizziness in the AM for a few hours that eventually resolves.  He denies spinning.  He does note worsening symptoms with standing.  He has symptoms 2-3 times a week.  He denies chest pain, shortness of breath, orthopnea, PND, edema.  He denies any bleeding issues.    Prior CV studies that were reviewed today include:    Carotid US 0000000 LICA [id number]  Carotid US 2/16 R 40-59%; L >80%   Myoview 6/15 Probable normal perfusion and soft tissue attenuation (bowel, diaphragm)   Cannot completely  r/o subendocardial scar.  No ischemia.   SInce previous study, inferior thinning more prominent.   LV Ejection Fraction: 56%.   Low Risk  Holter 6/15 NSVT, short runs SVT (ATach)  Echo 10/13 Vigorous LVF, EF 65-70, AVR ok with mild AS (Mean 38mm Hg, Peak 18mm Hg), mod MAC, mod BAE   Past Medical History:  Diagnosis Date  . Arthritis  02-15-12   osteoarthritis,right knee, pain right wrist.,  . Complication of anesthesia 02-15-12   very confused, and very slow to awaken after anesthesia  . Coronary artery disease 02-15-12   Heart valve replaced   . Heart murmur   . Hypercholesterolemia 02-15-12  . Hypertension   1. Dementia: On Aricept and Namenda.   2. Hyperlipidemia: myalgias with Vytorin, tolerating pravastatin 3. BPH 4. Osteoarthritis right knee, s/p TKR 10/13.  5. Cervical myelopathy  6. Obesity 7. HTN 8. Aortic stenosis: St. Jude mechanical aortic valve placed in 1993. Echo (10/13) with EF 65-70% and normal function of the aortic valve (mean gradient 12 mmHg).   9. Cardiac cath in 1993 with nonobstructive disease.  Lexiscan Cardiolite (6/15) with EF 56%, inferior thinning, no ischemia.  10. L-spine disease/spinal stenosis.  11. Abdominal ultrasound in 12/11 did not show AAA 12. Gout 13. Shoulder surgery 1/12 14. CKD 15. Chronic diastolic CHF: Echo ([id number]) with EF 65-70%, normally-functioning mechanical aortic valve (mean gradient 12).   16. Sinus bradycardia: Holter (6/15) showed 17 beats NSVT, several short runs atrial tachycardia, HR 46-95 mean 61.   17. Carotid stenosis: Carotid dopplers (0000000) with > 123XX123 LICA stenosis.  18. c-spine stenosis 19. Peripheral neuropathy.   Past Surgical History:  Procedure Laterality Date  . CARDIAC VALVE REPLACEMENT    . CORONARY ARTERY BYPASS  GRAFT    . JOINT REPLACEMENT    . NECK SURGERY    . SHOULDER OPEN ROTATOR CUFF REPAIR  02-15-12   left  . TOTAL KNEE ARTHROPLASTY  02/20/2012   Procedure: TOTAL KNEE ARTHROPLASTY;  Surgeon: Gearlean Alf, MD;  Location: [facility];  Service: Orthopedics;  Laterality: Right;    Current Medications: Current Meds  Medication Sig  . aspirin EC 81 MG tablet Take 81 mg by mouth every morning.   . donepezil (ARICEPT) 5 MG tablet TAKE 1 TABLET EVERY DAY  . enalapril (VASOTEC) 10 MG tablet Take 1 tablet (10 mg total) by mouth 2 (two)  times daily.  . finasteride (PROSCAR) 5 MG tablet TAKE 1 TABLET (5 MG TOTAL) BY MOUTH DAILY.  . fluorouracil (EFUDEX) 5 % cream Apply topically daily. To scalp and ears.  Stop when sore.  Marland Kitchen HYDROcodone-acetaminophen (NORCO) 10-325 MG tablet Take 1 tablet by mouth every 6 (six) hours as needed for moderate pain or severe pain.  Marland Kitchen JANTOVEN 5 MG tablet TAKE AS DIRECTED  . memantine (NAMENDA) 5 MG tablet TAKE 1 TABLET TWICE A DAY  . potassium chloride SA (K-DUR,KLOR-CON) 20 MEQ tablet Take 20 mEq by mouth every other day.  . pravastatin (PRAVACHOL) 40 MG tablet TAKE 1 TABLET (40 MG TOTAL) BY MOUTH EVERY EVENING.  . tamsulosin (FLOMAX) 0.4 MG CAPS capsule Take 1 capsule (0.4 mg total) by mouth daily.  . Vitamin D, Ergocalciferol, (DRISDOL) 50000 UNITS CAPS capsule TAKE 1 CAPSULE BY MOUTH DAILY FOR 1 WEEK THEN WEEKLY THEREAFTER  . [DISCONTINUED] hydrochlorothiazide (HYDRODIURIL) 25 MG tablet TAKE 1 TABLET (25 MG TOTAL) BY MOUTH DAILY.  . [DISCONTINUED] metoprolol tartrate (LOPRESSOR) 25 MG tablet TAKE 1 TABLET (25 MG TOTAL) BY MOUTH 2 (TWO) TIMES DAILY.     Allergies:   Patient has no known allergies.   Social History   Social History  . Marital status: Married    Spouse name: [name]  . Number of children: 3  . Years of education: N/A   Occupational History  . 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 Smoking status: Former Smoker    Quit date: 08/10/1995  . Smokeless tobacco: Never Used  . Alcohol use 0.0 oz/week     Comment: 1-2x a year  . Drug use: No  . Sexual activity: Yes   Other Topics Concern  . None   Social History Narrative   Health Care POA: Wife, [place]   Emergency Contact: wife, [place], [phone] (c)   End of Life Plan:   Who lives with you: Patient lives with wife.   Any pets:    Diet: Patient has a varied diet of protein, starch, and vegetables.   Exercise: Patient does not have a regular exercise plan,  afraid of falling.   Seatbelts: Patient reports wearing seatbelt when in vehicle.    Sun Exposure/Protection: Patient does not wear sun protection.   Hobbies: Patient likes to do yard work.           Family History:  The patient's family history includes Heart attack in his father; Heart disease in his mother; Hyperlipidemia in his father and mother; Hypertension in his father and mother; Varicose Veins in his mother.   ROS:   Please see the history of present illness.    Review of Systems  Musculoskeletal: Positive for back pain.  Neurological: Positive for dizziness.  Psychiatric/Behavioral: The patient is nervous/anxious.    All  other systems reviewed and are negative.   EKGs/Labs/Other Test Reviewed:    EKG:  EKG is  ordered today.  The ekg ordered today demonstrates Sinus bradycardia, HR 56, LAD, septal Q waves, QTc 403 ms, no significant change when compared to prior tracing  Recent Labs: 06/10/2015: ALT 8 01/28/2016: Hemoglobin 14.4; Platelets 212 03/02/2016: BUN 25; Creat 1.48; Potassium 4.7; Sodium 139   Recent Lipid Panel    Component Value Date/Time   CHOL 165 06/10/2015 1501   TRIG 206 (H) 06/10/2015 1501   HDL 35 (L) 06/10/2015 1501   CHOLHDL 4.7 06/10/2015 1501   VLDL 41 (H) 06/10/2015 1501   LDLCALC 89 06/10/2015 1501   LDLDIRECT 134.0 02/03/2014 1644     Physical Exam:    VS:  BP (!) 122/58   Pulse (!) 56   Ht 5\' 6"  (1.676 m)   Wt 182 lb 12.8 oz (82.9 kg)   BMI 29.50 kg/m     Orthostatic VS for the past 24 hrs (Last 3 readings):  BP- Sitting Pulse- Sitting BP- Standing at 0 minutes Pulse- Standing at 0 minutes BP- Standing at 3 minutes Pulse- Standing at 3 minutes  03/29/16 1602 147/73 51 143/61 59 149/62 59    Wt Readings from Last 3 Encounters:  03/29/16 182 lb 12.8 oz (82.9 kg)  03/02/16 182 lb 9.6 oz (82.8 kg)  01/28/16 180 lb 9.6 oz (81.9 kg)     Physical Exam  Constitutional: He is oriented to person, place, and time. He appears  well-developed and well-nourished. No distress.  HENT:  Head: Normocephalic and atraumatic.  Eyes: No scleral icterus.  Neck: No JVD present.  Cardiovascular: Normal rate, regular rhythm and normal heart sounds.   No murmur heard. Pulmonary/Chest: Effort normal. He has no wheezes. He has no rales.  Abdominal: Soft. There is no tenderness.  Musculoskeletal: He exhibits no edema.  Neurological: He is alert and oriented to person, place, and time.  Skin: Skin is warm and dry.  Psychiatric: He has a normal mood and affect.    ASSESSMENT:    1. Dizziness   2. S/P aortic valve replacement   3. Chronic diastolic CHF (congestive heart failure) ([place])   4. Bradycardia   5. Left-sided carotid artery disease ([place])   6. HYPERTENSION, BENIGN SYSTEMIC   7. HYPERCHOLESTEROLEMIA    PLAN:    In order of problems listed above:  1. Dizziness - Etiology not clear.  He has had bradycardia for a long time.  He has been on beta-blocker for a long time as well.  He is also on Aricept which can contribute to bradycardia.  His wife would rather he come off of Metoprolol all together.  He has not had syncope.  His blood pressure is controlled.  He is not orthostatic today but has noted worsening symptoms with standing. His blood pressures are equal in both arms by my check.  He denies any bleeding issues.  -  BMET, CBC  -  Decrease Metoprolol Tartrate to 12.5 bid - consider stopping if symptoms continue  -  Decrease HCTZ to 12.5 QD.    -  Obtain follow up echocardiogram   -  Obtain 2 week event monitor  -  Consider repeat carotid ultrasound at follow up if workup unrevealing  2. S/p SJ Mechanical AVR - No echo since 2013.    -  Coumadin managed in our [facility]madin Clinic.    -  Continue SBE prophylaxis.  -  Obtain follow up  echocardiogram  3. Chronic diastolic HF - Volume normal on very little diuretic Rx.  BMET today.  4. Bradycardia - Not clear if he is symptomatic from this.  Will get event monitor as  noted.   5. Carotid artery disease - Asymptomatic LICA 123XX123 stenosis.  He has been managed conservatively given poor functional status.  His BPs are equal and I am not convinced his carotid disease has anything to do with his dizziness.  Will consider arrange repeat carotid ultrasound at follow up.  6. HTN - BP controlled.  Medication changes as noted above.  If BP increases, will need to increase enalapril.    Medication Adjustments/Labs and Tests Ordered: Current medicines are reviewed at length with the patient today.  Concerns regarding medicines are outlined above.  Medication changes, Labs and Tests ordered today are outlined in the Patient Instructions noted below. Patient Instructions  Medication Instructions:  1. DECREASE METOPROLOL TARTRATE TO 12.5 MG TWICE DAILY (THIS WILL BE 1/2 TABLET OF THE 25 MG TABLET TWICE DAILY) 2. DECREASE HCTZ TO 12.5 MG DAILY (THIS WILL BE 1/2 TABLET DAILY OF THE 25 MG TABLET)  Labwork: TODAY BMET, CBC   Testing/Procedures: 1. Your physician has requested that you have an echocardiogram. Echocardiography is a painless test that uses sound waves to create images of your heart. It provides your doctor with information about the size and shape of your heart and how well your heart's chambers and valves are working. This procedure takes approximately one hour. There are no restrictions for this procedure.  2. Your physician has recommended that you wear an event monitor. Event monitors are medical devices that record the heart's electrical activity. Doctors most often [place] these monitors to diagnose arrhythmias. Arrhythmias are problems with the speed or rhythm of the heartbeat. The monitor is a small, portable device. You can wear one while you do your normal daily activities. This is usually used to diagnose what is causing palpitations/syncope (passing out).  Follow-Up: Steffanie Mingle, PAC 2-3 WEEKS IF POSSIBLE SAME DAY DR. Aundra Dubin IS IN THE OFFICE  Any Other  Special Instructions Will Be Listed Below (If Applicable).  If you need a refill on your cardiac medications before your next appointment, please call your pharmacy.  Signed, Richardson Dopp, PA-C  03/29/2016 4:07 PM    [facility] [place], [place], [place]  [address] Phone: [phone]; Fax: [phone]

## 2016-03-29 NOTE — Progress Notes (Signed)
He can see me in CHF clinic

## 2016-03-31 ENCOUNTER — Telehealth: Payer: Self-pay | Admitting: Physician Assistant

## 2016-03-31 NOTE — Telephone Encounter (Signed)
-----   Message from Liliane Shi, [name] sent at 03/30/2016  4:55 PM EST ----- Please tell Juan Li that his lab work shows  Renal function is overall stable (BUN, Creatinine) His potassium is normal. His hemoglobin is normal. All other parameters are within acceptable limits and no further intervention or testing required. Continue with current treatment plan. Richardson Dopp, PA-C   03/30/2016 4:55 PM

## 2016-03-31 NOTE — Telephone Encounter (Signed)
New Message ° °Pt voiced returning nurses call. ° °Please f/u °

## 2016-03-31 NOTE — Telephone Encounter (Signed)
Informed patient of results and verbal understanding expressed.  Instructed patient to sign DPR form next time he is in the office. He agrees with treatment plan.

## 2016-04-07 ENCOUNTER — Ambulatory Visit (INDEPENDENT_AMBULATORY_CARE_PROVIDER_SITE_OTHER): Payer: Medicare Other

## 2016-04-07 DIAGNOSIS — R001 Bradycardia, unspecified: Secondary | ICD-10-CM | POA: Diagnosis not present

## 2016-04-07 DIAGNOSIS — R42 Dizziness and giddiness: Secondary | ICD-10-CM

## 2016-04-12 ENCOUNTER — Ambulatory Visit (INDEPENDENT_AMBULATORY_CARE_PROVIDER_SITE_OTHER): Payer: Medicare Other | Admitting: *Deleted

## 2016-04-12 DIAGNOSIS — I359 Nonrheumatic aortic valve disorder, unspecified: Secondary | ICD-10-CM

## 2016-04-12 DIAGNOSIS — Z952 Presence of prosthetic heart valve: Secondary | ICD-10-CM

## 2016-04-12 LAB — POCT INR: INR: 3.4

## 2016-04-14 ENCOUNTER — Encounter: Payer: Self-pay | Admitting: Physician Assistant

## 2016-04-21 ENCOUNTER — Other Ambulatory Visit: Payer: Self-pay

## 2016-04-21 ENCOUNTER — Ambulatory Visit (HOSPITAL_COMMUNITY): Payer: Medicare Other | Attending: Internal Medicine

## 2016-04-21 ENCOUNTER — Ambulatory Visit (INDEPENDENT_AMBULATORY_CARE_PROVIDER_SITE_OTHER): Payer: Medicare Other | Admitting: Family Medicine

## 2016-04-21 DIAGNOSIS — I340 Nonrheumatic mitral (valve) insufficiency: Secondary | ICD-10-CM | POA: Insufficient documentation

## 2016-04-21 DIAGNOSIS — R42 Dizziness and giddiness: Secondary | ICD-10-CM | POA: Diagnosis not present

## 2016-04-21 DIAGNOSIS — Z7901 Long term (current) use of anticoagulants: Secondary | ICD-10-CM | POA: Diagnosis not present

## 2016-04-21 DIAGNOSIS — Z952 Presence of prosthetic heart valve: Secondary | ICD-10-CM | POA: Insufficient documentation

## 2016-04-21 DIAGNOSIS — I110 Hypertensive heart disease with heart failure: Secondary | ICD-10-CM | POA: Diagnosis not present

## 2016-04-21 DIAGNOSIS — S9031XA Contusion of right foot, initial encounter: Secondary | ICD-10-CM | POA: Diagnosis not present

## 2016-04-21 DIAGNOSIS — I509 Heart failure, unspecified: Secondary | ICD-10-CM | POA: Diagnosis not present

## 2016-04-22 ENCOUNTER — Encounter: Payer: Self-pay | Admitting: Family Medicine

## 2016-04-22 DIAGNOSIS — S9030XA Contusion of unspecified foot, initial encounter: Secondary | ICD-10-CM | POA: Insufficient documentation

## 2016-04-22 LAB — POCT INR: INR: 3.7

## 2016-04-22 NOTE — Progress Notes (Signed)
   Subjective:    Patient ID: Juan Li, male    DOB: 03/31/1932, 82 y.o.   MRN: [id number]  HPI Acute visit for bluish discoloration of toes of right foot.  Wife is a diligent patient and read on the warfarin information that blue toes could be a sign of a serious problem (specifically, blue/purple toe syndrome).  Toes are painless.  No trauma remembered.  He does have a shuffling gait and bumps his foot all the time.  INR goal is 2.5-3.5 due to valve.  Last check 2 weeks ago =3.4    Review of Systems     Objective:   Physical Exam Right foot normal except bruising on dorsum at level of distal metatarsals 2-4.  No bony tenderness.         Assessment & Plan:

## 2016-04-22 NOTE — Patient Instructions (Signed)
This is only minor bruising.  It is not the blue toe problem that you read about in the warfarin drug information.  No change in treatment.

## 2016-04-22 NOTE — Assessment & Plan Note (Signed)
With INR 3.7 today, bruising is almost certainly due to incidental contusion that Juan Li does not recall.  Specifically, the history and exam are not consistent with blue/purple toe syndrome from coumadin.  Observe only.

## 2016-04-26 ENCOUNTER — Ambulatory Visit (INDEPENDENT_AMBULATORY_CARE_PROVIDER_SITE_OTHER): Payer: Medicare Other | Admitting: Physician Assistant

## 2016-04-26 ENCOUNTER — Encounter: Payer: Self-pay | Admitting: Physician Assistant

## 2016-04-26 VITALS — BP 142/60 | HR 52 | Ht 66.0 in | Wt 183.4 lb

## 2016-04-26 DIAGNOSIS — I779 Disorder of arteries and arterioles, unspecified: Secondary | ICD-10-CM | POA: Diagnosis not present

## 2016-04-26 DIAGNOSIS — I5032 Chronic diastolic (congestive) heart failure: Secondary | ICD-10-CM | POA: Diagnosis not present

## 2016-04-26 DIAGNOSIS — R001 Bradycardia, unspecified: Secondary | ICD-10-CM

## 2016-04-26 DIAGNOSIS — R42 Dizziness and giddiness: Secondary | ICD-10-CM

## 2016-04-26 DIAGNOSIS — Z952 Presence of prosthetic heart valve: Secondary | ICD-10-CM

## 2016-04-26 DIAGNOSIS — I739 Peripheral vascular disease, unspecified: Secondary | ICD-10-CM

## 2016-04-26 DIAGNOSIS — I10 Essential (primary) hypertension: Secondary | ICD-10-CM

## 2016-04-26 NOTE — Patient Instructions (Addendum)
Medication Instructions:  Our medication list has you on Metoprolol Tartrate (Lopressor) 25 mg 1/2 tablet twice a day.  IF you are taking this twice a day, decrease to once a day for 3 days and then stop.  If your dizziness does not resolve with this, go ahead an resume the Metoprolol after 2 weeks  IF you are only taking the Metoprolol 1/2 tablet once a day, just stop taking it altogether and do not resume it.  Labwork: None   Testing/Procedures: Schedule a Carotid ultrasound to follow up on carotid artery disease.  Follow-Up: Dr. [name] in the [facility] in 2-3 months.  Any Other Special Instructions Will Be Listed Below (If Applicable). If you dizziness does not resolve over the next 2-3 weeks, follow up with Zigmund Gottron, MD and see if he thinks you should go back to Physical Therapy for "Vestibular Rehabilitation."  If you need a refill on your cardiac medications before your next appointment, please call your pharmacy.

## 2016-04-26 NOTE — Progress Notes (Signed)
Cardiology Office Note:    Date:  04/26/2016   ID:  Juan Li, DOB [birth year], MRN [id number]  PCP:  Zigmund Gottron, MD  Cardiologist:  dermatitis m  Electrophysiologist:  n/a  Referring MD: Zenia Resides, MD   Chief Complaint  Patient presents with  . Follow-up    Dizziness    History of Present Illness:    [name] is a 82 y.o. male with a hx of aortic stenosis s/p SJ (mechanical) AVR in [id number], diastolic HF, carotid artery disease, dementia.  Holter was done in 6/15 due to bradycardia and this showed NSVT (17 beats) and short runs of ATach.  His beta-blocker could not be increased due to bradycardia.  Nuclear stress test showed no ischemia.  Carotid US in 0000000 showed > 123XX123 LICA stenosis. This is asymptomatic and he has been treated conservatively.   I saw him 03/29/16 with complaints of dizziness.  He was noted to be bradycardic.  He was on beta-blocker and Aricept.  Orthostatic VS were normal.  BPs were equal in both arms.  I reduced his beta-blocker to Metoprolol 12.5 bid and decreased his HCTZ to 12.5 QD.  Echo demonstrated normal ejection fraction and normally functioning AVR.  Event monitor x 2 weeks demonstrated NSR and sinus brady.  There were no pauses or arrhythmias.    He returns for follow up.  Here with his wife.  He has had no change in his symptoms.  He continues to have dizziness, mainly in the AM.  He notes it with standing.  It is more of a spinning sensation or "swimmy headedness."  He denies syncope or near syncope.  He denies chest pain, significant shortness of breath, orthopnea, PND, edema.  He tells me that he feels better when he takes oxycodone.  He did see PT recently for balance.    Prior CV studies that were reviewed today include:    Event monitor 12/17 NSR, sinus brady, no pauses or arrhythmias  Echo 04/21/16 Mod LVH, EF 60-65, no RWMA, Gr 1 DD, mechanical AVR ok with mean gradient 8 mmHg, MAC, mild MR, mild MS (mean 2 mmHg), mild  RAE, PASP 25  Carotid US 0000000 LICA [id number]  Carotid US 2/16 R 40-59%; L >80%   Myoview 6/15 Probable normal perfusion and soft tissue attenuation (bowel, diaphragm)  Cannot completely r/o subendocardial scar. No ischemia.  SInce previous study, inferior thinning more prominent.  LV Ejection Fraction: 56%.  Low Risk  Holter 6/15 NSVT, short runs SVT (ATach)  Echo 10/13 Vigorous LVF, EF 65-70, AVR ok with mild AS (Mean 62mm Hg, Peak 60mm Hg), mod MAC, mod BAE  Past Medical History:  Diagnosis Date  . Arthritis 02-15-12   osteoarthritis,right knee, pain right wrist.,  . Complication of anesthesia 02-15-12   very confused, and very slow to awaken after anesthesia  . Coronary artery disease 02-15-12   Heart valve replaced   . Hypercholesterolemia 02-15-12  . Hypertension   . S/P AVR (aortic valve replacement)    Mechanical St. Jude AVR // Echo 12/17: Moderate LVH, EF 60-65, normal wall motion, grade 1 diastolic dysfunction, mechanical AVR okay with mean gradient 8 mmHg, MAC, mild mitral stenosis (mean gradient 2 mmHg), mild MR, mild RAE, PASP 25  1. Dementia: On Aricept and Namenda.  2. Hyperlipidemia: myalgias with Vytorin, tolerating pravastatin 3. BPH 4. Osteoarthritis right knee, s/p TKR 10/13.  5. Cervical myelopathy  6. Obesity 7. HTN 8. Aortic stenosis: St. Jude mechanical aortic  valve placed in 1993. Echo (10/13) with EF 65-70% and normal function of the aortic valve (mean gradient 12 mmHg).  9. Cardiac cath in 1993 with nonobstructive disease. Lexiscan Cardiolite (6/15) with EF 56%, inferior thinning, no ischemia.  10. L-spine disease/spinal stenosis.  11. Abdominal ultrasound in 12/11 did not show AAA 12. Gout 13. Shoulder surgery 1/12 14. CKD 15. Chronic diastolic CHF: Echo ([id number]) with EF 65-70%, normally-functioning mechanical aortic valve (mean gradient 12).  16. Sinus bradycardia: Holter (6/15) showed 17 beats NSVT, several short runs atrial  tachycardia, HR 46-95 mean 61.  17. Carotid stenosis: Carotid dopplers (0000000) with [id number] LICA stenosis.  18. c-spine stenosis 19. Peripheral neuropathy.   Past Surgical History:  Procedure Laterality Date  . CARDIAC VALVE REPLACEMENT    . CORONARY ARTERY BYPASS GRAFT    . JOINT REPLACEMENT    . NECK SURGERY    . SHOULDER OPEN ROTATOR CUFF REPAIR  02-15-12   left  . TOTAL KNEE ARTHROPLASTY  02/20/2012   Procedure: TOTAL KNEE ARTHROPLASTY;  Surgeon: Gearlean Alf, MD;  Location: [facility];  Service: Orthopedics;  Laterality: Right;    Current Medications: Current Meds  Medication Sig  . aspirin EC 81 MG tablet Take 81 mg by mouth every morning.   . donepezil (ARICEPT) 5 MG tablet TAKE 1 TABLET EVERY DAY  . enalapril (VASOTEC) 10 MG tablet Take 1 tablet (10 mg total) by mouth 2 (two) times daily.  . finasteride (PROSCAR) 5 MG tablet TAKE 1 TABLET (5 MG TOTAL) BY MOUTH DAILY.  . fluorouracil (EFUDEX) 5 % cream Apply topically daily. To scalp and ears.  Stop when sore.  . hydrochlorothiazide (HYDRODIURIL) 25 MG tablet Take 0.5 tablets (12.5 mg total) by mouth daily.  Marland Kitchen HYDROcodone-acetaminophen (NORCO) 10-325 MG tablet Take 1 tablet by mouth every 6 (six) hours as needed for moderate pain or severe pain.  Marland Kitchen JANTOVEN 5 MG tablet TAKE AS DIRECTED  . memantine (NAMENDA) 5 MG tablet TAKE 1 TABLET TWICE A DAY  . metoprolol tartrate (LOPRESSOR) 25 MG tablet Take 0.5 tablets (12.5 mg total) by mouth 2 (two) times daily.  . potassium chloride SA (K-DUR,KLOR-CON) 20 MEQ tablet Take 20 mEq by mouth every other day.  . pravastatin (PRAVACHOL) 40 MG tablet TAKE 1 TABLET (40 MG TOTAL) BY MOUTH EVERY EVENING.  . tamsulosin (FLOMAX) 0.4 MG CAPS capsule Take 1 capsule (0.4 mg total) by mouth daily.  . Vitamin D, Ergocalciferol, (DRISDOL) 50000 UNITS CAPS capsule TAKE 1 CAPSULE BY MOUTH DAILY FOR 1 WEEK THEN WEEKLY THEREAFTER     Allergies:   Patient has no known allergies.   Social History    Social History  . Marital status: Married    Spouse name: [name]  . Number of children: 3  . Years of education: N/A   Occupational History  . 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 Smoking status: Former Smoker    Quit date: 08/10/1995  . Smokeless tobacco: Never Used  . Alcohol use 0.0 oz/week     Comment: 1-2x a year  . Drug use: No  . Sexual activity: Yes   Other Topics Concern  . None   Social History Narrative   Health Care POA: Wife, [place]   Emergency Contact: wife, [place], [phone] (c)   End of Life Plan:   Who lives with you: Patient lives with wife.   Any pets:    Diet: Patient has a varied  diet of protein, starch, and vegetables.   Exercise: Patient does not have a regular exercise plan, afraid of falling.   Seatbelts: Patient reports wearing seatbelt when in vehicle.    Sun Exposure/Protection: Patient does not wear sun protection.   Hobbies: Patient likes to do yard work.           Family History:  The patient's family history includes Heart attack in his father; Heart disease in his mother; Hyperlipidemia in his father and mother; Hypertension in his father and mother; Varicose Veins in his mother.   ROS:   Please see the history of present illness.    ROS All other systems reviewed and are negative.   EKGs/Labs/Other Test Reviewed:    EKG:  EKG is  ordered today.  The ekg ordered today demonstrates sinus brady, HR 52, LAD, NSSTTW changes, QTc 383 ms, no change from prior tracing.   Recent Labs: 06/10/2015: ALT 8 03/29/2016: BUN 30; Creat 1.63; Hemoglobin 15.0; Platelets 214; Potassium 4.9; Sodium 139   Recent Lipid Panel    Component Value Date/Time   CHOL 165 06/10/2015 1501   TRIG 206 (H) 06/10/2015 1501   HDL 35 (L) 06/10/2015 1501   CHOLHDL 4.7 06/10/2015 1501   VLDL 41 (H) 06/10/2015 1501   LDLCALC 89 06/10/2015 1501   LDLDIRECT 134.0 02/03/2014 1644     Physical Exam:     VS:  BP (!) 142/60   Pulse (!) 52   Ht 5\' 6"  (1.676 m)   Wt 183 lb 6.4 oz (83.2 kg)   BMI 29.60 kg/m     Wt Readings from Last 3 Encounters:  04/26/16 183 lb 6.4 oz (83.2 kg)  04/21/16 182 lb 6.4 oz (82.7 kg)  03/29/16 182 lb 12.8 oz (82.9 kg)     Physical Exam  Constitutional: He is oriented to person, place, and time. He appears well-developed and well-nourished. No distress.  HENT:  Head: Normocephalic and atraumatic.  Eyes: No scleral icterus.  Neck: No JVD present.  Cardiovascular: Normal rate and regular rhythm.   No murmur heard. Normal S1, mechanical S2  Pulmonary/Chest: He has no wheezes. He has no rales.  Abdominal: There is no tenderness.  Musculoskeletal: He exhibits no edema.  Neurological: He is alert and oriented to person, place, and time.  Skin: Skin is warm and dry.  Psychiatric: He has a normal mood and affect.    ASSESSMENT:    1. Dizziness   2. S/P aortic valve replacement   3. Chronic diastolic CHF (congestive heart failure) ([place])   4. Bradycardia   5. Bilateral carotid artery disease ([place])   6. HYPERTENSION, BENIGN SYSTEMIC    PLAN:    In order of problems listed above:  1. Dizziness - Etiology not clear.  He describes more of a spinning sensation. I am not convinced his bradycardia is contributing.  He is supposed to be on Metoprolol 12.5 bid.  His wife thinks he is only taking it once a day.  If he is only taking it once a day, he will just DC it.  If it is twice a day, he will taper off for 2 weeks with plans to resume if his dizziness does not resolve.  If his dizziness does not resolve with stopping his beta-blocker, he should follow up with his PCP to discuss possible referral back to PT for vestibular rehab.  2. S/p SJ Mechanical AVR - Recent echo with stable AVR.  Continue SBE prophylaxis.  Continue Coumadin.  3. Chronic diastolic HF - Volume stable.  Continue current Rx.  4. Bradycardia - As noted, his HR on his monitor was stable  and there were no significant pauses.  However, I am having him come off of his beta-blocker to see if his dizziness improves at all.  5. Carotid artery disease - Asymptomatic LICA 123XX123 stenosis.  He has been managed conservatively given poor functional status.  He has not had FU Carotid US since 3/16.    -  Arrange Carotid US.  6. HTN - BP is at target.  Medication Adjustments/Labs and Tests Ordered: Current medicines are reviewed at length with the patient today.  Concerns regarding medicines are outlined above.  Medication changes, Labs and Tests ordered today are outlined in the Patient Instructions noted below. Patient Instructions  Medication Instructions:  Our medication list has you on Metoprolol Tartrate (Lopressor) 25 mg 1/2 tablet twice a day.  IF you are taking this twice a day, decrease to once a day for 3 days and then stop.  If your dizziness does not resolve with this, go ahead an resume the Metoprolol after 2 weeks  IF you are only taking the Metoprolol 1/2 tablet once a day, just stop taking it altogether and do not resume it.  Labwork: None   Testing/Procedures: Schedule a Carotid ultrasound to follow up on carotid artery disease.  Follow-Up: Dr. [name] in the [facility] in 2-3 months.  Any Other Special Instructions Will Be Listed Below (If Applicable). If you dizziness does not resolve over the next 2-3 weeks, follow up with Zigmund Gottron, MD and see if he thinks you should go back to Physical Therapy for "Vestibular Rehabilitation."  If you need a refill on your cardiac medications before your next appointment, please call your pharmacy.   Signed, Richardson Dopp, PA-C  04/26/2016 5:14 PM    [facility] [address], [place], [place]  [address] Phone: [phone]; Fax: [phone]

## 2016-04-28 ENCOUNTER — Other Ambulatory Visit: Payer: Self-pay | Admitting: Family Medicine

## 2016-05-03 ENCOUNTER — Ambulatory Visit (INDEPENDENT_AMBULATORY_CARE_PROVIDER_SITE_OTHER): Payer: Medicare Other | Admitting: *Deleted

## 2016-05-03 DIAGNOSIS — Z7901 Long term (current) use of anticoagulants: Secondary | ICD-10-CM | POA: Diagnosis not present

## 2016-05-03 DIAGNOSIS — Z952 Presence of prosthetic heart valve: Secondary | ICD-10-CM

## 2016-05-03 DIAGNOSIS — I359 Nonrheumatic aortic valve disorder, unspecified: Secondary | ICD-10-CM | POA: Diagnosis not present

## 2016-05-03 LAB — POCT INR: INR: 4.5

## 2016-05-04 ENCOUNTER — Telehealth: Payer: Self-pay | Admitting: Family Medicine

## 2016-05-04 MED ORDER — HYDROCODONE-ACETAMINOPHEN 10-325 MG PO TABS: 1.0000 | ORAL_TABLET | Freq: Four times a day (QID) | ORAL | 0 refills | Status: DC | PRN

## 2016-05-04 NOTE — Telephone Encounter (Signed)
Pt called and would like to have a refill on his Hydrocodone left up front. Please call when ready. jw

## 2016-05-04 NOTE — Telephone Encounter (Signed)
Patient informed. Sharon T Saunders, CMA  

## 2016-05-04 NOTE — Telephone Encounter (Signed)
Done

## 2016-05-12 ENCOUNTER — Ambulatory Visit (HOSPITAL_COMMUNITY)
Admission: RE | Admit: 2016-05-12 | Discharge: 2016-05-12 | Disposition: A | Discharge status: Home / Self Care | Payer: Medicare Other | Source: Ambulatory Visit | Attending: Cardiology | Admitting: Cardiology

## 2016-05-12 DIAGNOSIS — E785 Hyperlipidemia, unspecified: Secondary | ICD-10-CM | POA: Diagnosis not present

## 2016-05-12 DIAGNOSIS — Z87891 Personal history of nicotine dependence: Secondary | ICD-10-CM | POA: Insufficient documentation

## 2016-05-12 DIAGNOSIS — I2510 Atherosclerotic heart disease of native coronary artery without angina pectoris: Secondary | ICD-10-CM | POA: Insufficient documentation

## 2016-05-12 DIAGNOSIS — I6523 Occlusion and stenosis of bilateral carotid arteries: Secondary | ICD-10-CM | POA: Insufficient documentation

## 2016-05-12 DIAGNOSIS — I779 Disorder of arteries and arterioles, unspecified: Secondary | ICD-10-CM

## 2016-05-12 DIAGNOSIS — I10 Essential (primary) hypertension: Secondary | ICD-10-CM | POA: Diagnosis not present

## 2016-05-12 DIAGNOSIS — I739 Peripheral vascular disease, unspecified: Secondary | ICD-10-CM

## 2016-05-13 ENCOUNTER — Encounter: Payer: Self-pay | Admitting: Physician Assistant

## 2016-05-13 ENCOUNTER — Telehealth: Payer: Self-pay | Admitting: *Deleted

## 2016-05-13 NOTE — Telephone Encounter (Signed)
Lmtcb on both home and cell # to go over Carotid results, findings and recommendations. Per Brynda Rim PA I called Dr. Nicole Cella office with [facility]. During my scheduling the pt for an appt with Dr. Scot Dock the scheduler said based on the pt's results they will need to do a repeat Carotid, so what this means is the pt will need 2 appts. Pt has been scheduled for Carotid to be done on 05/26/16 @ 12 pm @ [facility]; second appt is a f/u with Dr. Scot Dock on 06/01/16 @ 10:45.

## 2016-05-13 NOTE — Telephone Encounter (Signed)
Ptcb and has been notified of Cartoid U/S results, findings as well as recommendations by phone with verbal understanding. Pt agreeable to plan of care to see Dr. Scot Dock again at [facility]. Pt aware appts have been made already today for him. 05/26/16 @ 12 pm he will have Carotid U/S at [facility], on 06/01/16 @ 10:45 he will see Dr. Scot Dock. Pt verbalized appts to me x 2 with verbal understanding.

## 2016-05-17 ENCOUNTER — Telehealth: Payer: Self-pay | Admitting: Physician Assistant

## 2016-05-17 ENCOUNTER — Ambulatory Visit (INDEPENDENT_AMBULATORY_CARE_PROVIDER_SITE_OTHER): Payer: Medicare Other | Admitting: *Deleted

## 2016-05-17 DIAGNOSIS — Z952 Presence of prosthetic heart valve: Secondary | ICD-10-CM

## 2016-05-17 DIAGNOSIS — I359 Nonrheumatic aortic valve disorder, unspecified: Secondary | ICD-10-CM

## 2016-05-17 DIAGNOSIS — Z7901 Long term (current) use of anticoagulants: Secondary | ICD-10-CM | POA: Diagnosis not present

## 2016-05-17 LAB — POCT INR: INR: 2.2

## 2016-05-17 NOTE — Telephone Encounter (Signed)
Mrs. Ramakrishnan is calling to find out if Juan Li is going to need another carotid when he just had one on 05/12/16.  Please call

## 2016-05-17 NOTE — Telephone Encounter (Signed)
I reviewed with Dr. [name] in 03/2016. He wants to see Mr. Juan Li in the [facility] for Cardiology follow up. Make sure he sees Dr. [name] in the [facility] clinic. Richardson Dopp, PA-C   05/17/2016 5:06 PM

## 2016-05-17 NOTE — Telephone Encounter (Signed)
DPR for wife. I called back Mrs. Douthit who wanted to know why did pt need another Carotid U/S with Dr. Scot Dock office 05/26/16 when he just had one in our office. I explained to her that pt's carotid u/s shows moderate disease on the right and continued severe disease on the left. Per Richardson Dopp, PA he felt that pt should f/u with Dr. Scot Dock again for further evaluation. Mrs. Woolworth was ok with this. Mrs. Everly states to me that she was given 3 different names of cardiologist for the pt to see in our office and the pt will decide which one he wants to see. I advised Mrs. Inclan that supposed to be following up with Dr. Aundra Dubin in the [facility]. I advised her that I will confirm this with Brynda Rim. PA and let her know. Mrs. Arruda said she appreciated my help in this matter.

## 2016-05-20 ENCOUNTER — Other Ambulatory Visit: Payer: Self-pay

## 2016-05-20 DIAGNOSIS — I779 Disorder of arteries and arterioles, unspecified: Secondary | ICD-10-CM

## 2016-05-20 DIAGNOSIS — I739 Peripheral vascular disease, unspecified: Principal | ICD-10-CM

## 2016-05-24 ENCOUNTER — Other Ambulatory Visit: Payer: Self-pay | Admitting: *Deleted

## 2016-05-24 DIAGNOSIS — I6522 Occlusion and stenosis of left carotid artery: Secondary | ICD-10-CM

## 2016-05-26 ENCOUNTER — Ambulatory Visit (HOSPITAL_COMMUNITY)
Admission: RE | Admit: 2016-05-26 | Discharge: 2016-05-26 | Disposition: A | Discharge status: Home / Self Care | Payer: Medicare Other | Source: Ambulatory Visit | Attending: Vascular Surgery | Admitting: Vascular Surgery

## 2016-05-26 DIAGNOSIS — I739 Peripheral vascular disease, unspecified: Secondary | ICD-10-CM

## 2016-05-26 DIAGNOSIS — I6522 Occlusion and stenosis of left carotid artery: Secondary | ICD-10-CM | POA: Diagnosis not present

## 2016-05-26 DIAGNOSIS — I779 Disorder of arteries and arterioles, unspecified: Secondary | ICD-10-CM

## 2016-05-26 LAB — VAS US CAROTID
LCCAPDIAS: 11 cm/s
LCCAPSYS: 110 cm/s
LEFT ECA DIAS: -20 cm/s
LEFT VERTEBRAL DIAS: 11 cm/s
Left CCA dist dias: 11 cm/s
Left CCA dist sys: 82 cm/s
Left ICA prox dias: -131 cm/s
Left ICA prox sys: -553 cm/s

## 2016-05-31 ENCOUNTER — Ambulatory Visit (INDEPENDENT_AMBULATORY_CARE_PROVIDER_SITE_OTHER): Payer: Medicare Other | Admitting: Pharmacist

## 2016-05-31 DIAGNOSIS — Z952 Presence of prosthetic heart valve: Secondary | ICD-10-CM

## 2016-05-31 DIAGNOSIS — I6522 Occlusion and stenosis of left carotid artery: Secondary | ICD-10-CM | POA: Diagnosis not present

## 2016-05-31 DIAGNOSIS — I359 Nonrheumatic aortic valve disorder, unspecified: Secondary | ICD-10-CM | POA: Diagnosis not present

## 2016-05-31 DIAGNOSIS — Z7901 Long term (current) use of anticoagulants: Secondary | ICD-10-CM

## 2016-05-31 LAB — PROTIME-INR
INR: 7.1 — AB (ref 0.8–1.2)
PROTHROMBIN TIME: 69.7 s — AB (ref 9.1–12.0)

## 2016-05-31 LAB — POCT INR: INR: 7.1

## 2016-06-01 ENCOUNTER — Ambulatory Visit: Payer: Medicare Other | Admitting: Vascular Surgery

## 2016-06-03 ENCOUNTER — Encounter: Payer: Self-pay | Admitting: Vascular Surgery

## 2016-06-05 ENCOUNTER — Other Ambulatory Visit: Payer: Self-pay | Admitting: Family Medicine

## 2016-06-06 ENCOUNTER — Ambulatory Visit (INDEPENDENT_AMBULATORY_CARE_PROVIDER_SITE_OTHER): Payer: Medicare Other

## 2016-06-06 DIAGNOSIS — I6522 Occlusion and stenosis of left carotid artery: Secondary | ICD-10-CM

## 2016-06-06 DIAGNOSIS — Z7901 Long term (current) use of anticoagulants: Secondary | ICD-10-CM

## 2016-06-06 DIAGNOSIS — Z952 Presence of prosthetic heart valve: Secondary | ICD-10-CM | POA: Diagnosis not present

## 2016-06-06 DIAGNOSIS — I359 Nonrheumatic aortic valve disorder, unspecified: Secondary | ICD-10-CM | POA: Diagnosis not present

## 2016-06-06 LAB — POCT INR: INR: 2.5

## 2016-06-08 ENCOUNTER — Ambulatory Visit (INDEPENDENT_AMBULATORY_CARE_PROVIDER_SITE_OTHER): Payer: Medicare Other | Admitting: Vascular Surgery

## 2016-06-08 ENCOUNTER — Encounter: Payer: Self-pay | Admitting: Vascular Surgery

## 2016-06-08 VITALS — BP 135/69 | HR 86 | Temp 97.3°F | Resp 20 | Ht 66.0 in | Wt 186.0 lb

## 2016-06-08 DIAGNOSIS — I6522 Occlusion and stenosis of left carotid artery: Secondary | ICD-10-CM | POA: Diagnosis not present

## 2016-06-08 NOTE — Progress Notes (Signed)
Patient name: Juan Li MRN: [id number] DOB: 1932/03/26 Sex: male  REASON FOR VISIT: Follow up of carotid disease.  HPI: BRONSYN Juan Li is a 83 y.o. male who I last saw on 07/16/2014. He was found to have a carotid bruit which prompted a duplex scan that showed a greater than 80% left carotid stenosis with a 40-59% right carotid stenosis. He is right-handed. He denied any symptoms at that time. He was on aspirin and was on a statin. He was also on Coumadin because of a mechanical aortic valve replacement over 20 years ago.  He also has a history of cervical stenosis and has had previous surgery by Dr. Joya Salm. Given that the patient was 83 years old, had limited neck mobility and also significant cardiac issues he did not want to pursue surgery and I did not think that this was unreasonable.   I reviewed his office note from the most recent visit to heart care on 04/26/2016. He had been having some problems with dizziness. He has a history of a mechanical aortic valve and chronic diastolic heart failure. In addition he has some issues with bradycardia.  Since I saw him last, he does have some issues with dizziness. However this has been a chronic problem. He denies any focal weakness or paresthesias. He denies any expressive or receptive aphasia. He denies amaurosis fugax.  Past Medical History:  Diagnosis Date  . Arthritis 02-15-12   osteoarthritis,right knee, pain right wrist.,  . Carotid artery disease ([place])    Carotid US 1/18: R 40-59; L 80-99  . Complication of anesthesia 02-15-12   very confused, and very slow to awaken after anesthesia  . Coronary artery disease 02-15-12   Heart valve replaced   . Hypercholesterolemia 02-15-12  . Hypertension   . S/P AVR (aortic valve replacement)    Mechanical St. Jude AVR // Echo 12/17: Moderate LVH, EF 60-65, normal wall motion, grade 1 diastolic dysfunction, mechanical AVR okay with mean gradient 8 mmHg, MAC, mild mitral stenosis (mean  gradient 2 mmHg), mild MR, mild RAE, PASP 25    Family History  Problem Relation Age of Onset  . Heart disease Mother   . Hyperlipidemia Mother   . Hypertension Mother   . Varicose Veins Mother   . Hyperlipidemia Father   . Hypertension Father   . Heart attack Father     SOCIAL HISTORY: Social History  Substance Use Topics  . Smoking status: Former Smoker    Quit date: 08/10/1995  . Smokeless tobacco: Never Used  . Alcohol use 0.0 oz/week     Comment: 1-2x a year    No Known Allergies  Current Outpatient Prescriptions  Medication Sig Dispense Refill  . aspirin EC 81 MG tablet Take 81 mg by mouth every morning.     . donepezil (ARICEPT) 5 MG tablet TAKE 1 TABLET EVERY DAY 90 tablet 3  . enalapril (VASOTEC) 10 MG tablet Take 1 tablet (10 mg total) by mouth 2 (two) times daily. 180 tablet 3  . finasteride (PROSCAR) 5 MG tablet TAKE 1 TABLET (5 MG TOTAL) BY MOUTH DAILY. 90 tablet 3  . fluorouracil (EFUDEX) 5 % cream Apply topically daily. To scalp and ears.  Stop when sore. 40 g 0  . hydrochlorothiazide (HYDRODIURIL) 25 MG tablet Take 0.5 tablets (12.5 mg total) by mouth daily. 90 tablet 3  . HYDROcodone-acetaminophen (NORCO) 10-325 MG tablet Take 1 tablet by mouth every 6 (six) hours as needed for moderate pain or severe  pain. 120 tablet 0  . JANTOVEN 5 MG tablet TAKE AS DIRECTED 100 tablet 3  . memantine (NAMENDA) 5 MG tablet TAKE 1 TABLET TWICE A DAY 180 tablet 3  . potassium chloride SA (K-DUR,KLOR-CON) 20 MEQ tablet Take 20 mEq by mouth every other day.    . pravastatin (PRAVACHOL) 40 MG tablet TAKE 1 TABLET (40 MG TOTAL) BY MOUTH EVERY EVENING. 90 tablet 1  . tamsulosin (FLOMAX) 0.4 MG CAPS capsule Take 1 capsule (0.4 mg total) by mouth daily. 90 capsule 3   No current facility-administered medications for this visit.     REVIEW OF SYSTEMS:  [X]  denotes positive finding, [ ]  denotes negative finding Cardiac  Comments:  Chest pain or chest pressure:    Shortness of  breath upon exertion:    Short of breath when lying flat:    Irregular heart rhythm:        Vascular    Pain in calf, thigh, or hip brought on by ambulation:    Pain in feet at night that wakes you up from your sleep:     Blood clot in your veins:    Leg swelling:         Pulmonary    Oxygen at home:    Productive cough:     Wheezing:         Neurologic    Sudden weakness in arms or legs:     Sudden numbness in arms or legs:     Sudden onset of difficulty speaking or slurred speech:    Temporary loss of vision in one eye:     Problems with dizziness:  X       Gastrointestinal    Blood in stool:     Vomited blood:         Genitourinary    Burning when urinating:     Blood in urine:        Psychiatric    Major depression:         Hematologic    Bleeding problems:    Problems with blood clotting too easily:        Skin    Rashes or ulcers:        Constitutional    Fever or chills:      PHYSICAL EXAM: Vitals:   06/08/16 1535 06/08/16 1538  BP: (!) 154/61 135/69  Pulse: 86   Resp: 20   Temp: 97.3 F (36.3 C)   TempSrc: Oral   SpO2: 93%   Weight: 186 lb (84.4 kg)   Height: 5' 6"  (1.676 m)     GENERAL: The patient is a well-nourished male, in no acute distress. The vital signs are documented above. CARDIAC: There is a regular rate and rhythm.  VASCULAR: He has soft bilateral carotid bruits. PULMONARY: There is good air exchange bilaterally without wheezing or rales. ABDOMEN: Soft and non-tender with normal pitched bowel sounds.  MUSCULOSKELETAL: There are no major deformities or cyanosis. NEUROLOGIC: No focal weakness or paresthesias are detected. SKIN: There are no ulcers or rashes noted. PSYCHIATRIC: The patient has a normal affect.  DATA:   CAROTID DUPLEX: The patient had a carotid duplex scan at the [facility] office on 05/12/2016. This showed a 40-59% right carotid stenosis and an 80% left carotid stenosis. This study showed that both vertebral  arteries were patent with antegrade flow.  CAROTID DUPLEX: A limited carotid duplex scan in our office on 05/26/2016 show  a greater than 80% left carotid  stenosis. His bifurcation was noted to be high. The plaque it also extended fairly high. The distal ICA was not well visualized due to the high bifurcation.  MEDICAL ISSUES:  GREATER THAN 80% LEFT CAROTID STENOSIS:  This is a somewhat complicated situation in that he has a greater than 80% asymptomatic left carotid stenosis. He has very limited neck mobility because of previous cervical disc surgery. In addition his bifurcation is noted to be high and it is difficult to visualize the internal carotid artery above the plaque. We might normally consider carotid stenting in this situation, but given he is asymptomatic we have only really been doing this for symptomatic disease given the slightly higher risk of stroke. He would certainly be at increased risk for carotid endarterectomy given his age and multiple medical comorbidities in addition to his limited neck mobility. If he were to consider surgery and certainly he would need a CT angiogram of the neck to determine if the disease was surgically accessible. When we had discussed this in 2016 he did not wish to pursue surgery and he still feels this way. I do not think that this is unreasonable. He is on aspirin and is on a statin.  I think he would be fine for him to continue his routine follow up studies at the [place] office. If he became symptomatic, then I think we should reconsider addressing the greater than 80% left carotid stenosis. He would require a CT angiogram to determine if he was a candidate for carotid stenting and then this would be clearly the safest option given his age and medical comorbidities in addition to his neck mobility issues.    Deitra Mayo [facility] [phone]

## 2016-06-16 ENCOUNTER — Ambulatory Visit (INDEPENDENT_AMBULATORY_CARE_PROVIDER_SITE_OTHER): Payer: Medicare Other | Admitting: *Deleted

## 2016-06-16 DIAGNOSIS — I6522 Occlusion and stenosis of left carotid artery: Secondary | ICD-10-CM

## 2016-06-16 DIAGNOSIS — Z952 Presence of prosthetic heart valve: Secondary | ICD-10-CM

## 2016-06-16 DIAGNOSIS — I359 Nonrheumatic aortic valve disorder, unspecified: Secondary | ICD-10-CM | POA: Diagnosis not present

## 2016-06-16 DIAGNOSIS — Z7901 Long term (current) use of anticoagulants: Secondary | ICD-10-CM

## 2016-06-16 LAB — POCT INR: INR: 2.9

## 2016-06-19 ENCOUNTER — Other Ambulatory Visit: Payer: Self-pay | Admitting: Cardiology

## 2016-06-20 ENCOUNTER — Other Ambulatory Visit: Payer: Self-pay | Admitting: Family Medicine

## 2016-06-20 MED ORDER — HYDROCODONE-ACETAMINOPHEN 10-325 MG PO TABS: 1.0000 | ORAL_TABLET | Freq: Four times a day (QID) | ORAL | 0 refills | Status: DC | PRN

## 2016-06-20 NOTE — Telephone Encounter (Signed)
Pt needs a refill on hydrocodone. ep °

## 2016-06-20 NOTE — Telephone Encounter (Signed)
Rx written and patient informed.

## 2016-06-30 ENCOUNTER — Ambulatory Visit (INDEPENDENT_AMBULATORY_CARE_PROVIDER_SITE_OTHER): Payer: Medicare Other | Admitting: *Deleted

## 2016-06-30 DIAGNOSIS — Z7901 Long term (current) use of anticoagulants: Secondary | ICD-10-CM | POA: Diagnosis not present

## 2016-06-30 DIAGNOSIS — I359 Nonrheumatic aortic valve disorder, unspecified: Secondary | ICD-10-CM | POA: Diagnosis not present

## 2016-06-30 DIAGNOSIS — Z952 Presence of prosthetic heart valve: Secondary | ICD-10-CM

## 2016-06-30 DIAGNOSIS — I6522 Occlusion and stenosis of left carotid artery: Secondary | ICD-10-CM

## 2016-06-30 LAB — POCT INR: INR: 3.7

## 2016-07-14 ENCOUNTER — Ambulatory Visit (INDEPENDENT_AMBULATORY_CARE_PROVIDER_SITE_OTHER): Payer: Medicare Other | Admitting: Pharmacist

## 2016-07-14 DIAGNOSIS — Z7901 Long term (current) use of anticoagulants: Secondary | ICD-10-CM

## 2016-07-14 DIAGNOSIS — Z952 Presence of prosthetic heart valve: Secondary | ICD-10-CM | POA: Diagnosis not present

## 2016-07-14 DIAGNOSIS — I6522 Occlusion and stenosis of left carotid artery: Secondary | ICD-10-CM

## 2016-07-14 DIAGNOSIS — I359 Nonrheumatic aortic valve disorder, unspecified: Secondary | ICD-10-CM

## 2016-07-14 LAB — POCT INR: INR: 3.2

## 2016-08-04 ENCOUNTER — Ambulatory Visit (INDEPENDENT_AMBULATORY_CARE_PROVIDER_SITE_OTHER): Payer: Medicare Other | Admitting: Pharmacist

## 2016-08-04 DIAGNOSIS — Z7901 Long term (current) use of anticoagulants: Secondary | ICD-10-CM

## 2016-08-04 DIAGNOSIS — I6522 Occlusion and stenosis of left carotid artery: Secondary | ICD-10-CM

## 2016-08-04 DIAGNOSIS — I359 Nonrheumatic aortic valve disorder, unspecified: Secondary | ICD-10-CM | POA: Diagnosis not present

## 2016-08-04 DIAGNOSIS — Z952 Presence of prosthetic heart valve: Secondary | ICD-10-CM | POA: Diagnosis not present

## 2016-08-04 LAB — POCT INR: INR: 3.5

## 2016-08-10 ENCOUNTER — Telehealth: Payer: Self-pay | Admitting: Family Medicine

## 2016-08-10 MED ORDER — HYDROCODONE-ACETAMINOPHEN 10-325 MG PO TABS: 1.0000 | ORAL_TABLET | Freq: Four times a day (QID) | ORAL | 0 refills | Status: DC | PRN

## 2016-08-10 NOTE — Addendum Note (Signed)
Addended by: Zenia Resides on: 08/10/2016 03:12 PM   Modules accepted: Orders

## 2016-08-10 NOTE — Telephone Encounter (Signed)
Pt is calling for a refill of his Hydrocodone to be left up front for pick up. Please inform when ready to pick up. jw

## 2016-09-01 ENCOUNTER — Other Ambulatory Visit: Payer: Self-pay | Admitting: Family Medicine

## 2016-09-01 ENCOUNTER — Ambulatory Visit (INDEPENDENT_AMBULATORY_CARE_PROVIDER_SITE_OTHER): Payer: Medicare Other | Admitting: *Deleted

## 2016-09-01 DIAGNOSIS — Z7901 Long term (current) use of anticoagulants: Secondary | ICD-10-CM | POA: Diagnosis not present

## 2016-09-01 DIAGNOSIS — I359 Nonrheumatic aortic valve disorder, unspecified: Secondary | ICD-10-CM | POA: Diagnosis not present

## 2016-09-01 DIAGNOSIS — I6522 Occlusion and stenosis of left carotid artery: Secondary | ICD-10-CM

## 2016-09-01 DIAGNOSIS — Z952 Presence of prosthetic heart valve: Secondary | ICD-10-CM

## 2016-09-01 LAB — POCT INR: INR: 3.7

## 2016-09-09 ENCOUNTER — Telehealth: Payer: Self-pay

## 2016-09-09 NOTE — Telephone Encounter (Signed)
Wife got out of hospital, has been a little woozie. Juan Li has told her not to drive because he doesn't want her to hurt herself or someone else. She isn't listening to him. [place] would like for Dr. Andria Frames to give her a call. She will listen to him. Ottis Stain, CMA

## 2016-09-13 NOTE — Telephone Encounter (Signed)
Called patient (wife) and discussed.  I see both husband and wife this week.  Will cover driving and other issues at that time.  I doubt wife needs restriction.

## 2016-09-14 ENCOUNTER — Ambulatory Visit (INDEPENDENT_AMBULATORY_CARE_PROVIDER_SITE_OTHER): Payer: Medicare Other | Admitting: *Deleted

## 2016-09-14 ENCOUNTER — Ambulatory Visit (INDEPENDENT_AMBULATORY_CARE_PROVIDER_SITE_OTHER): Payer: Medicare Other | Admitting: Family Medicine

## 2016-09-14 ENCOUNTER — Encounter: Payer: Self-pay | Admitting: Family Medicine

## 2016-09-14 DIAGNOSIS — I6522 Occlusion and stenosis of left carotid artery: Secondary | ICD-10-CM

## 2016-09-14 DIAGNOSIS — M545 Low back pain: Secondary | ICD-10-CM | POA: Diagnosis not present

## 2016-09-14 DIAGNOSIS — R269 Unspecified abnormalities of gait and mobility: Secondary | ICD-10-CM | POA: Diagnosis not present

## 2016-09-14 DIAGNOSIS — I359 Nonrheumatic aortic valve disorder, unspecified: Secondary | ICD-10-CM

## 2016-09-14 DIAGNOSIS — Z952 Presence of prosthetic heart valve: Secondary | ICD-10-CM | POA: Diagnosis not present

## 2016-09-14 DIAGNOSIS — M159 Polyosteoarthritis, unspecified: Secondary | ICD-10-CM

## 2016-09-14 DIAGNOSIS — Z7901 Long term (current) use of anticoagulants: Secondary | ICD-10-CM | POA: Diagnosis not present

## 2016-09-14 DIAGNOSIS — G8929 Other chronic pain: Secondary | ICD-10-CM

## 2016-09-14 DIAGNOSIS — N183 Chronic kidney disease, stage 3 unspecified: Secondary | ICD-10-CM

## 2016-09-14 DIAGNOSIS — I10 Essential (primary) hypertension: Secondary | ICD-10-CM

## 2016-09-14 LAB — POCT INR: INR: 3.2

## 2016-09-14 MED ORDER — HYDROCODONE-ACETAMINOPHEN 10-325 MG PO TABS: 1.0000 | ORAL_TABLET | Freq: Four times a day (QID) | ORAL | 0 refills | Status: DC | PRN

## 2016-09-14 NOTE — Patient Instructions (Addendum)
The pain pills should last three months.  OK to see me once every three months. There is no good cure for back pain.  Good luck. It would help me if you would schedule an annual wellness visit with my nurse. I will call with blood test results.

## 2016-09-14 NOTE — Assessment & Plan Note (Signed)
Handicap placard form filled out.

## 2016-09-14 NOTE — Progress Notes (Signed)
   Subjective:    Patient ID: Juan Li, male    DOB: 03-Jun-1931, 83 y.o.   MRN: [id number]  HPI Multiple issues: 1. Needs handicapped parking form filled out.  Done 2. Refill of chronic pain meds.  Tolerating well.  I am not concerned about abuse.  Refill as requested. 3. "My back is killing me."  No radiculopathy symptoms.  No bowel or bladder involvement.  Known arthritis and DDD.   4. No recent assessment of kidney function or lipids.   5. HBP, tolerating meds well    Review of Systems     Objective:   Physical Exam        Assessment & Plan:

## 2016-09-14 NOTE — Assessment & Plan Note (Signed)
Good control.  Check labs 

## 2016-09-14 NOTE — Assessment & Plan Note (Signed)
Check labs.  No recent assessment.

## 2016-09-14 NOTE — Assessment & Plan Note (Signed)
No effective intervention for back pain.  Continue to remain active.

## 2016-09-15 ENCOUNTER — Other Ambulatory Visit: Payer: Self-pay | Admitting: Family Medicine

## 2016-09-15 ENCOUNTER — Encounter: Payer: Self-pay | Admitting: Family Medicine

## 2016-09-15 LAB — CBC
HEMOGLOBIN: 14.6 g/dL (ref 13.0–17.7)
Hematocrit: 42.2 % (ref 37.5–51.0)
MCH: 32.7 pg (ref 26.6–33.0)
MCHC: 34.6 g/dL (ref 31.5–35.7)
MCV: 94 fL (ref 79–97)
PLATELETS: 207 10*3/uL (ref 150–379)
RBC: 4.47 x10E6/uL (ref 4.14–5.80)
RDW: 14.4 % (ref 12.3–15.4)
WBC: 8.6 10*3/uL (ref 3.4–10.8)

## 2016-09-15 LAB — CMP14+EGFR
ALK PHOS: 55 IU/L (ref 39–117)
ALT: 11 IU/L (ref 0–44)
AST: 16 IU/L (ref 0–40)
Albumin/Globulin Ratio: 1.6 (ref 1.2–2.2)
Albumin: 4.4 g/dL (ref 3.5–4.7)
BUN/Creatinine Ratio: 22 (ref 10–24)
BUN: 38 mg/dL — AB (ref 8–27)
Bilirubin Total: 0.8 mg/dL (ref 0.0–1.2)
CO2: 20 mmol/L (ref 18–29)
CREATININE: 1.71 mg/dL — AB (ref 0.76–1.27)
Calcium: 9.6 mg/dL (ref 8.6–10.2)
Chloride: 102 mmol/L (ref 96–106)
GFR calc Af Amer: 42 mL/min/{1.73_m2} — ABNORMAL LOW (ref >59)
GFR calc non Af Amer: 36 mL/min/{1.73_m2} — ABNORMAL LOW (ref >59)
GLUCOSE: 86 mg/dL (ref 65–99)
Globulin, Total: 2.8 g/dL (ref 1.5–4.5)
Potassium: 4.7 mmol/L (ref 3.5–5.2)
Sodium: 141 mmol/L (ref 134–144)
Total Protein: 7.2 g/dL (ref 6.0–8.5)

## 2016-09-15 LAB — LIPID PANEL
CHOLESTEROL TOTAL: 194 mg/dL (ref 100–199)
Chol/HDL Ratio: 4.6 ratio (ref 0.0–5.0)
HDL: 42 mg/dL (ref >39)
LDL CALC: 121 mg/dL — AB (ref 0–99)
TRIGLYCERIDES: 156 mg/dL — AB (ref 0–149)
VLDL CHOLESTEROL CAL: 31 mg/dL (ref 5–40)

## 2016-09-15 MED ORDER — DONEPEZIL HCL 5 MG PO TABS: 5.0000 mg | ORAL_TABLET | Freq: Every day | ORAL | 3 refills | Status: DC

## 2016-09-15 MED ORDER — PRAVASTATIN SODIUM 40 MG PO TABS: 40.0000 mg | ORAL_TABLET | Freq: Every evening | ORAL | 3 refills | Status: DC

## 2016-09-15 MED ORDER — MEMANTINE HCL 5 MG PO TABS: 5.0000 mg | ORAL_TABLET | Freq: Two times a day (BID) | ORAL | 3 refills | Status: DC

## 2016-09-15 MED ORDER — HYDROCHLOROTHIAZIDE 25 MG PO TABS: 12.5000 mg | ORAL_TABLET | Freq: Every day | ORAL | 3 refills | Status: DC

## 2016-09-29 ENCOUNTER — Ambulatory Visit: Payer: Medicare Other | Admitting: *Deleted

## 2016-10-12 ENCOUNTER — Ambulatory Visit (INDEPENDENT_AMBULATORY_CARE_PROVIDER_SITE_OTHER): Payer: Medicare Other | Admitting: *Deleted

## 2016-10-12 DIAGNOSIS — I6522 Occlusion and stenosis of left carotid artery: Secondary | ICD-10-CM | POA: Diagnosis not present

## 2016-10-12 DIAGNOSIS — Z952 Presence of prosthetic heart valve: Secondary | ICD-10-CM | POA: Diagnosis not present

## 2016-10-12 DIAGNOSIS — Z7901 Long term (current) use of anticoagulants: Secondary | ICD-10-CM

## 2016-10-12 DIAGNOSIS — I359 Nonrheumatic aortic valve disorder, unspecified: Secondary | ICD-10-CM | POA: Diagnosis not present

## 2016-10-12 LAB — POCT INR: INR: 4.8

## 2016-10-14 ENCOUNTER — Other Ambulatory Visit: Payer: Self-pay | Admitting: Family Medicine

## 2016-10-14 DIAGNOSIS — I10 Essential (primary) hypertension: Secondary | ICD-10-CM

## 2016-10-21 ENCOUNTER — Encounter (INDEPENDENT_AMBULATORY_CARE_PROVIDER_SITE_OTHER): Payer: Self-pay

## 2016-10-21 ENCOUNTER — Ambulatory Visit (INDEPENDENT_AMBULATORY_CARE_PROVIDER_SITE_OTHER): Payer: Medicare Other | Admitting: Internal Medicine

## 2016-10-21 ENCOUNTER — Encounter: Payer: Self-pay | Admitting: Internal Medicine

## 2016-10-21 VITALS — BP 138/82 | HR 58 | Ht 66.0 in | Wt 182.0 lb

## 2016-10-21 DIAGNOSIS — E7800 Pure hypercholesterolemia, unspecified: Secondary | ICD-10-CM

## 2016-10-21 DIAGNOSIS — I779 Disorder of arteries and arterioles, unspecified: Secondary | ICD-10-CM

## 2016-10-21 DIAGNOSIS — I5032 Chronic diastolic (congestive) heart failure: Secondary | ICD-10-CM | POA: Diagnosis not present

## 2016-10-21 DIAGNOSIS — Z8679 Personal history of other diseases of the circulatory system: Secondary | ICD-10-CM

## 2016-10-21 DIAGNOSIS — R0989 Other specified symptoms and signs involving the circulatory and respiratory systems: Secondary | ICD-10-CM | POA: Diagnosis not present

## 2016-10-21 DIAGNOSIS — S81801A Unspecified open wound, right lower leg, initial encounter: Secondary | ICD-10-CM

## 2016-10-21 DIAGNOSIS — Z952 Presence of prosthetic heart valve: Secondary | ICD-10-CM

## 2016-10-21 DIAGNOSIS — R42 Dizziness and giddiness: Secondary | ICD-10-CM

## 2016-10-21 DIAGNOSIS — I739 Peripheral vascular disease, unspecified: Secondary | ICD-10-CM

## 2016-10-21 NOTE — Patient Instructions (Signed)
Medication Instructions:  Your physician recommends that you continue on your current medications as directed. Please refer to the Current Medication list given to you today.   Labwork: None   Testing/Procedures: Your physician has requested that you have a lower  extremity arterial duplex. This test is an ultrasound of the arteries in the legs. It looks at arterial blood flow in the legs . Allow one hour for Lower Arterial scans. There are no restrictions or special instructions   Follow-Up: Your physician recommends that you schedule a follow-up appointment in: 6 months with Richardson Dopp, PA       If you need a refill on your cardiac medications before your next appointment, please call your pharmacy.

## 2016-10-21 NOTE — Progress Notes (Signed)
Follow-up Outpatient Visit Date: 10/21/2016  Primary Care Provider: Zenia Resides, MD [place] [address]  Chief Complaint: Follow-up aortic valve replacement  HPI:  Juan Li is a 83 y.o. year-old male with history of aortic stenosis status post mechanical aortic valve replacement (1993), heart failure with preserved ejection fraction, carotid artery stenosis, chronic kidney disease stage III, and dementia, who presents for follow-up of aortic valve disease and diastolic heart failure. He was previously followed by Juan Li and was last seen by Juan Li on 04/26/16. He presents alone today, as his wife recently suffered a stroke in his is painting in rehabilitation. At his last visit, Juan Li complained of intermittent dizziness, prompting discontinuation of metoprolol. Today, he reports overall feeling well. He still has rare episodes of dizziness, occurring once or twice every 3 months. Frequency seems to have improved with discontinuation of metoprolol. He has not had any chest pain. He reports chronic dyspnea on exertion with strenuous activities, which is unchanged for more than a year. He denies lower extremity edema, palpitations, orthopnea, and PND. He has noted a in ulcer on the right medial calf that has not healed for the last few months. He denies claudication or other wounds on his legs. He is compliant with his medications, including chronic anticoagulation with warfarin and aspirin. He has not had any significant bleeding.Marland Kitchen  --------------------------------------------------------------------------------------------------  Past Medical History:  Diagnosis Date  . Arthritis 02-15-12   osteoarthritis,right knee, pain right wrist.,  . Carotid artery disease ([place])    Carotid US 1/18: R 40-59; L 80-99  . Complication of anesthesia 02-15-12   very confused, and very slow to awaken after anesthesia  . Coronary artery disease 02-15-12   Heart valve replaced   . Hypercholesterolemia 02-15-12  . Hypertension   . S/P AVR (aortic valve replacement)    Mechanical St. Jude AVR // Echo 12/17: Moderate LVH, EF 60-65, normal wall motion, grade 1 diastolic dysfunction, mechanical AVR okay with mean gradient 8 mmHg, MAC, mild mitral stenosis (mean gradient 2 mmHg), mild MR, mild RAE, PASP 25   Past Surgical History:  Procedure Laterality Date  . CARDIAC VALVE REPLACEMENT    . CORONARY ARTERY BYPASS GRAFT    . JOINT REPLACEMENT    . NECK SURGERY    . SHOULDER OPEN ROTATOR CUFF REPAIR  02-15-12   left  . TOTAL KNEE ARTHROPLASTY  02/20/2012   Procedure: TOTAL KNEE ARTHROPLASTY;  Surgeon: Gearlean Alf, MD;  Location: [facility];  Service: Orthopedics;  Laterality: Right;    Recent CV Pertinent Labs: Lab Results  Component Value Date   CHOL 194 09/14/2016   HDL 42 09/14/2016   LDLCALC 121 (H) 09/14/2016   LDLDIRECT 134.0 02/03/2014   TRIG 156 (H) 09/14/2016   CHOLHDL 4.6 09/14/2016   CHOLHDL 4.7 06/10/2015   INR 4.8 10/12/2016   INR 7.1 (>) 05/31/2016   INR 2.8 07/16/2010   K 4.7 09/14/2016   BUN 38 (H) 09/14/2016   CREATININE 1.71 (H) 09/14/2016   CREATININE 1.63 (H) 03/29/2016    Past medical and surgical history were reviewed and updated in EPIC.  Current Meds  Medication Sig  . aspirin EC 81 MG tablet Take 81 mg by mouth every morning.   . donepezil (ARICEPT) 5 MG tablet Take 1 tablet (5 mg total) by mouth daily.  . enalapril (VASOTEC) 10 MG tablet TAKE 1 TABLET (10 MG TOTAL) BY MOUTH 2 (TWO) TIMES DAILY.  . finasteride (PROSCAR)  5 MG tablet TAKE 1 TABLET (5 MG TOTAL) BY MOUTH DAILY.  . fluorouracil (EFUDEX) 5 % cream Apply topically daily. To scalp and ears.  Stop when sore.  . hydrochlorothiazide (HYDRODIURIL) 25 MG tablet Take 0.5 tablets (12.5 mg total) by mouth daily.  Marland Kitchen HYDROcodone-acetaminophen (NORCO) 10-325 MG tablet Take 1 tablet by mouth every 6 (six) hours as needed for moderate pain or severe pain.  Marland Kitchen  JANTOVEN 5 MG tablet TAKE AS DIRECTED  . memantine (NAMENDA) 5 MG tablet Take 1 tablet (5 mg total) by mouth 2 (two) times daily.  . potassium chloride SA (K-DUR,KLOR-CON) 20 MEQ tablet Take 20 mEq by mouth every other day.  . pravastatin (PRAVACHOL) 40 MG tablet Take 1 tablet (40 mg total) by mouth every evening.  . tamsulosin (FLOMAX) 0.4 MG CAPS capsule Take 1 capsule (0.4 mg total) by mouth daily.    Allergies: Patient has no known allergies.  Social History   Social History  . Marital status: Married    Spouse name: Juan Li  . Number of children: 3  . Years of education: N/A   Occupational History  . 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 Smoking status: Former Smoker    Quit date: 08/10/1995  . Smokeless tobacco: Never Used  . Alcohol use 0.0 oz/week     Comment: 1-2x a year  . Drug use: No  . Sexual activity: Yes   Other Topics Concern  . Not on file   Social History Narrative   Health Care POA: Wife, Juan Li   Emergency Contact: wife, Juan Li, [phone] (c)   Juan Li of Life Plan:   Who lives with you: Patient lives with wife.   Any pets:    Diet: Patient has a varied diet of protein, starch, and vegetables.   Exercise: Patient does not have a regular exercise plan, afraid of falling.   Seatbelts: Patient reports wearing seatbelt when in vehicle.    Sun Exposure/Protection: Patient does not wear sun protection.   Hobbies: Patient likes to do yard work.          Family History  Problem Relation Age of Onset  . Heart disease Mother   . Hyperlipidemia Mother   . Hypertension Mother   . Varicose Veins Mother   . Hyperlipidemia Father   . Hypertension Father   . Heart attack Father     Review of Systems: Review of Systems  Constitutional: Negative.   HENT: Negative.   Eyes: Negative.   Respiratory: Positive for shortness of breath (with activity - stable).   Cardiovascular: Negative for chest pain,  palpitations, orthopnea, claudication, leg swelling and PND.  Gastrointestinal: Positive for diarrhea.  Genitourinary: Negative.   Musculoskeletal: Positive for back pain.  Skin: Negative for rash (Non-healing ulcer on right calf (see HPI)).  Neurological: Positive for dizziness. Negative for focal weakness.  Endo/Heme/Allergies: Negative.   Psychiatric/Behavioral: Negative.    --------------------------------------------------------------------------------------------------  Physical Exam: BP 138/82   Pulse (!) 58   Ht _0  (1.676 m)   Wt 182 lb (82.6 kg)   BMI 29.38 kg/m   General:  Overweight elderly man, seated comfortably in the exam room. HEENT: No conjunctival pallor or scleral icterus.  Moist mucous membranes.  OP clear. Neck: Supple without lymphadenopathy, thyromegaly, JVD, or HJR. Lungs: Normal work of breathing.  Clear to auscultation bilaterally without wheezes or crackles. Heart: Regular rate and rhythm without murmurs, rubs, or gallops. Mechanical S2 present. Non-displaced  PMI. Abd: Bowel sounds present.  Soft, NT/ND without hepatosplenomegaly Ext: No lower extremity edema.  2+ radial pulses, 1+ pedal pulses bilaterally Skin: Warm and dry. Shallow ulceration measuring approximately 1 cm in diameter noted along the right medial distal calf. No surrounding erythema.  EKG:  Sinus bradycardia (HR 58 bpm) with non-specific ST/T changes. Nonspecific ST/T changes are new from 04/26/16.  Lab Results  Component Value Date   WBC 8.6 09/14/2016   HGB 14.6 09/14/2016   HCT 42.2 09/14/2016   MCV 94 09/14/2016   PLT 207 09/14/2016    Lab Results  Component Value Date   NA 141 09/14/2016   K 4.7 09/14/2016   CL 102 09/14/2016   CO2 20 09/14/2016   BUN 38 (H) 09/14/2016   CREATININE 1.71 (H) 09/14/2016   GLUCOSE 86 09/14/2016   ALT 11 09/14/2016    Lab Results  Component Value Date   CHOL 194 09/14/2016   HDL 42 09/14/2016   LDLCALC 121 (H) 09/14/2016    LDLDIRECT 134.0 02/03/2014   TRIG 156 (H) 09/14/2016   CHOLHDL 4.6 09/14/2016    --------------------------------------------------------------------------------------------------  ASSESSMENT AND PLAN: Chronic diastolic heart failure with history of aortic stenosis status post mechanical AVR Juan Li is stable with chronic exertional dyspnea that has been unchanged for more than a year. He does not have any signs or symptoms of worsening heart failure. Echo in 04/2016 showed preserved LV contraction with normal function of the mechanical AVR. We will continue his current medication regimen including indefinite dose aspirin and warfarin.  Decreased pedal pulses and right calf wound Though Juan Li does not endorse claudication, his poorly healing right calf wound could be a sign of peripheral vascular disease. We will therefore obtain lower extremity arterial Dopplers and ABIs for further evaluation.  Dizziness This has been a long-standing problem for Juan Li, improved with discontinuation of metoprolol. He has not had any falls. We will not make any medication changes today or pursue additional testing.  Carotid artery stenosis The patient is known to have greater than 80% left carotid artery stenosis and moderate disease involving the right carotid artery. He has not been symptomatic and was previously seen by Dr. Doren Custard, most recently in February. Surgery has been deferred due to lack of symptoms and multiple comorbidities. We will plan to repeat carotid Doppler in early 2019.  Hyperlipidemia Juan Li is currently on pravastatin 40 mg daily but was noted to have an LDL of 121 last month. Given his advanced cerebrovascular disease, it would be reasonable to consider tartar regarding a lower LDL goal (ideally less than 70). We discussed escalation of statin therapy to a high e- intensity agent. He wishes to discuss this with Dr. Andria Frames, his PCP.  Follow-up: Return to clinic in  6 months with Juan Li, sooner if significant abnormalities noted on peripheral vascular studies.  Nelva Bush, MD 10/22/2016 8:24 PM

## 2016-10-22 ENCOUNTER — Encounter: Payer: Self-pay | Admitting: Internal Medicine

## 2016-10-26 ENCOUNTER — Ambulatory Visit (INDEPENDENT_AMBULATORY_CARE_PROVIDER_SITE_OTHER): Payer: Medicare Other | Admitting: *Deleted

## 2016-10-26 ENCOUNTER — Other Ambulatory Visit: Payer: Self-pay | Admitting: Internal Medicine

## 2016-10-26 DIAGNOSIS — I359 Nonrheumatic aortic valve disorder, unspecified: Secondary | ICD-10-CM | POA: Diagnosis not present

## 2016-10-26 DIAGNOSIS — Z952 Presence of prosthetic heart valve: Secondary | ICD-10-CM

## 2016-10-26 DIAGNOSIS — R0989 Other specified symptoms and signs involving the circulatory and respiratory systems: Secondary | ICD-10-CM

## 2016-10-26 DIAGNOSIS — Z7901 Long term (current) use of anticoagulants: Secondary | ICD-10-CM

## 2016-10-26 DIAGNOSIS — I6522 Occlusion and stenosis of left carotid artery: Secondary | ICD-10-CM | POA: Diagnosis not present

## 2016-10-26 LAB — POCT INR: INR: 2.4

## 2016-11-09 ENCOUNTER — Ambulatory Visit (HOSPITAL_COMMUNITY)
Admission: RE | Admit: 2016-11-09 | Discharge: 2016-11-09 | Disposition: A | Discharge status: Home / Self Care | Payer: Medicare Other | Source: Ambulatory Visit | Attending: Cardiology | Admitting: Cardiology

## 2016-11-09 DIAGNOSIS — R9439 Abnormal result of other cardiovascular function study: Secondary | ICD-10-CM | POA: Diagnosis not present

## 2016-11-09 DIAGNOSIS — I743 Embolism and thrombosis of arteries of the lower extremities: Secondary | ICD-10-CM | POA: Diagnosis not present

## 2016-11-09 DIAGNOSIS — I745 Embolism and thrombosis of iliac artery: Secondary | ICD-10-CM | POA: Insufficient documentation

## 2016-11-09 DIAGNOSIS — R0989 Other specified symptoms and signs involving the circulatory and respiratory systems: Secondary | ICD-10-CM

## 2016-11-09 DIAGNOSIS — I700 Atherosclerosis of aorta: Secondary | ICD-10-CM | POA: Insufficient documentation

## 2016-11-09 DIAGNOSIS — I739 Peripheral vascular disease, unspecified: Secondary | ICD-10-CM | POA: Diagnosis not present

## 2016-11-15 ENCOUNTER — Telehealth: Payer: Self-pay | Admitting: Internal Medicine

## 2016-11-15 NOTE — Telephone Encounter (Signed)
New message ° ° ° °Pt is returning call to RN. °

## 2016-11-15 NOTE — Telephone Encounter (Signed)
Returning your call . Please call at [phone].Marland Kitchen Thanks

## 2016-11-15 NOTE — Telephone Encounter (Signed)
No answer

## 2016-11-15 NOTE — Telephone Encounter (Signed)
Pt states message left on his phone this morning from our office, states name not left. Pt advised I did not call him this morning, may have been a call for appointment reminder for this Friday.

## 2016-11-16 ENCOUNTER — Ambulatory Visit (INDEPENDENT_AMBULATORY_CARE_PROVIDER_SITE_OTHER): Payer: Medicare Other | Admitting: *Deleted

## 2016-11-16 DIAGNOSIS — Z7901 Long term (current) use of anticoagulants: Secondary | ICD-10-CM

## 2016-11-16 DIAGNOSIS — I6522 Occlusion and stenosis of left carotid artery: Secondary | ICD-10-CM | POA: Diagnosis not present

## 2016-11-16 DIAGNOSIS — Z952 Presence of prosthetic heart valve: Secondary | ICD-10-CM

## 2016-11-16 DIAGNOSIS — I359 Nonrheumatic aortic valve disorder, unspecified: Secondary | ICD-10-CM

## 2016-11-16 LAB — POCT INR: INR: 3

## 2016-11-18 ENCOUNTER — Ambulatory Visit (INDEPENDENT_AMBULATORY_CARE_PROVIDER_SITE_OTHER): Payer: Medicare Other | Admitting: Internal Medicine

## 2016-11-18 ENCOUNTER — Encounter: Payer: Self-pay | Admitting: *Deleted

## 2016-11-18 ENCOUNTER — Encounter: Payer: Self-pay | Admitting: Internal Medicine

## 2016-11-18 VITALS — BP 130/42 | HR 62 | Ht 66.0 in | Wt 186.0 lb

## 2016-11-18 DIAGNOSIS — I5032 Chronic diastolic (congestive) heart failure: Secondary | ICD-10-CM | POA: Diagnosis not present

## 2016-11-18 DIAGNOSIS — I779 Disorder of arteries and arterioles, unspecified: Secondary | ICD-10-CM | POA: Diagnosis not present

## 2016-11-18 DIAGNOSIS — I739 Peripheral vascular disease, unspecified: Secondary | ICD-10-CM

## 2016-11-18 DIAGNOSIS — S81801D Unspecified open wound, right lower leg, subsequent encounter: Secondary | ICD-10-CM | POA: Diagnosis not present

## 2016-11-18 DIAGNOSIS — Z952 Presence of prosthetic heart valve: Secondary | ICD-10-CM

## 2016-11-18 NOTE — Patient Instructions (Signed)
Medication Instructions:  Your physician recommends that you continue on your current medications as directed. Please refer to the Current Medication list given to you today.    Labwork: You will need to have lab prior to the procedure. You can get it the same day you see coumadin clinic about holding your coumadin prior to procedure.  Testing/Procedures: You are scheduled for an Abdominal Aortogram with Lower extremity runoff August 2,2018 See instruction letter.  Follow-Up: Follow up will be determined after the procedure August 2,2018.  Any Other Special Instructions Will Be Listed Below (If Applicable).  You have been referred to the Wound Clinic    If you need a refill on your cardiac medications before your next appointment, please call your pharmacy.

## 2016-11-18 NOTE — Progress Notes (Signed)
Follow-up Outpatient Visit Date: 11/18/2016  Primary Care Provider: Zenia Resides, MD [place] [address]  Chief Complaint: Leg pain and poorly healing right calf wound  HPI:  Mr. Cowden is a 83 y.o. year-old male with history of aortic stenosis status post mechanical aortic valve replacement (1993), heart failure with preserved ejection fraction, carotid artery stenosis, chronic kidney disease stage III, and dementia, who presents for follow-up of leg pain and poorly healing right calf wound. He was previously followed by Dr. Aundra Dubin. I met him on 10/21/16, at which time he was doing well with the exception of pain in both legs and a poorly healing wound along the right medial calf. He reports that for over a year, he has had significant pain in both legs. He describes a burning sensation in the soles of his feet that worsens with any activity. However, it is also present at rest. He also notes considerable aching in his calves, thighs, knees, and hips. He also has intermittent leg swelling that seems dependent. At this time, he is only able to walk about 10 yards before needing to stop due to leg pain. He also has chronic low back pain, which has been stable. He denies chest pain, palpitations, lightheadedness, and shortness of breath. He remains compliant with warfarin and has not had any bleeding.  After our last visit, we performed a lower extremity vascular studies, which demonstrated normal ABIs but evidence of at least moderate bilateral inflow and outflow disease. Two-vessel runoff was also noted on the right.   Of note, Mr. Bubeck wife suffered a stroke earlier this year. She is back at home but continues to perform outpatient rehabilitation. He is her primary caregiver.  --------------------------------------------------------------------------------------------------  Past Medical History:  Diagnosis Date  . Arthritis 02-15-12   osteoarthritis,right knee, pain right wrist.,  . Carotid artery disease ([place])    Carotid US 1/18: R 40-59; L 80-99  . Complication of anesthesia 02-15-12   very confused, and very slow to awaken after anesthesia  . Coronary artery disease 02-15-12   Heart valve replaced   . Hypercholesterolemia 02-15-12  . Hypertension   . S/P AVR (aortic valve replacement)    Mechanical St. Jude AVR // Echo 12/17: Moderate LVH, EF 60-65, normal wall motion, grade 1 diastolic dysfunction, mechanical AVR okay with mean gradient 8 mmHg, MAC, mild mitral stenosis (mean gradient 2 mmHg), mild MR, mild RAE, PASP 25   Past Surgical History:  Procedure Laterality Date  . JOINT REPLACEMENT    . MECHANICAL AORTIC VALVE REPLACEMENT    . NECK SURGERY    . SHOULDER OPEN ROTATOR CUFF REPAIR  02-15-12   left  . TOTAL KNEE ARTHROPLASTY  02/20/2012   Procedure: TOTAL KNEE ARTHROPLASTY;  Surgeon: Gearlean Alf, MD;  Location: [facility];  Service: Orthopedics;  Laterality: Right;    Recent CV Pertinent Labs: Lab Results  Component Value Date   CHOL 194 09/14/2016   HDL 42 09/14/2016   LDLCALC 121 (H) 09/14/2016   LDLDIRECT 134.0 02/03/2014   TRIG 156 (H) 09/14/2016   CHOLHDL 4.6 09/14/2016   CHOLHDL 4.7 06/10/2015   INR 3.0 11/16/2016   INR 7.1 (>) 05/31/2016   INR 2.8 07/16/2010   K 4.7 09/14/2016   BUN 38 (H) 09/14/2016   CREATININE 1.71 (H) 09/14/2016   CREATININE 1.63 (H) 03/29/2016    Past medical and surgical history were reviewed and updated in EPIC.  Current Meds  Medication Sig  .  aspirin EC 81 MG tablet Take 81 mg by mouth every morning.   . donepezil (ARICEPT) 5 MG tablet Take 1 tablet (5 mg total) by mouth daily.  . enalapril (VASOTEC) 10 MG tablet TAKE 1 TABLET (10 MG TOTAL) BY MOUTH 2 (TWO) TIMES DAILY.  . finasteride (PROSCAR) 5 MG tablet TAKE 1 TABLET (5 MG TOTAL) BY MOUTH DAILY.  . fluorouracil (EFUDEX) 5 % cream Apply topically daily. To scalp and ears.  Stop when sore.  .  hydrochlorothiazide (HYDRODIURIL) 25 MG tablet Take 0.5 tablets (12.5 mg total) by mouth daily.  Marland Kitchen HYDROcodone-acetaminophen (NORCO) 10-325 MG tablet Take 1 tablet by mouth every 6 (six) hours as needed for moderate pain or severe pain.  Marland Kitchen JANTOVEN 5 MG tablet TAKE AS DIRECTED  . memantine (NAMENDA) 5 MG tablet Take 1 tablet (5 mg total) by mouth 2 (two) times daily.  . potassium chloride SA (K-DUR,KLOR-CON) 20 MEQ tablet Take 20 mEq by mouth every other day.  . pravastatin (PRAVACHOL) 40 MG tablet Take 1 tablet (40 mg total) by mouth every evening.  . tamsulosin (FLOMAX) 0.4 MG CAPS capsule Take 1 capsule (0.4 mg total) by mouth daily.    Allergies: Patient has no known allergies.  Social History   Social History  . Marital status: Married    Spouse name: [name]  . Number of children: 3  . Years of education: N/A   Occupational History  . 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 Smoking status: Former Smoker    Quit date: 08/10/1995  . Smokeless tobacco: Never Used  . Alcohol use 0.0 oz/week     Comment: 1-2x a year  . Drug use: No  . Sexual activity: Yes   Other Topics Concern  . Not on file   Social History Narrative   Health Care POA: Wife, [place]   Emergency Contact: wife, [place], [phone] (c)    of Life Plan:   Who lives with you: Patient lives with wife.   Any pets:    Diet: Patient has a varied diet of protein, starch, and vegetables.   Exercise: Patient does not have a regular exercise plan, afraid of falling.   Seatbelts: Patient reports wearing seatbelt when in vehicle.    Sun Exposure/Protection: Patient does not wear sun protection.   Hobbies: Patient likes to do yard work.          Family History  Problem Relation Age of Onset  . Heart disease Mother   . Hyperlipidemia Mother   . Hypertension Mother   . Varicose Veins Mother   . Hyperlipidemia Father   . Hypertension Father   . Heart  attack Father     Review of Systems: Review of Systems  Cardiovascular: Positive for claudication.  Musculoskeletal: Positive for back pain and joint pain.  All other systems reviewed and are negative.  --------------------------------------------------------------------------------------------------  Physical Exam: BP (!) 130/42   Pulse 62   Ht 5' 6" (1.676 m)   Wt 186 lb (84.4 kg)   SpO2 95%   BMI 30.02 kg/m   General:  Overweight, elderly man, seated comfortably in the exam room HEENT: No conjunctival pallor or scleral icterus.  Moist mucous membranes.  OP clear. Neck: Supple without lymphadenopathy, thyromegaly, JVD, or HJR. Lungs: Normal work of breathing.  Clear to auscultation bilaterally without wheezes or crackles. Heart: Regular rate and rhythm without murmurs, rubs, or gallops. Mechanical S2 noted. Non-displaced PMI. Abd: Bowel sounds  present.  Soft, NT/ND without hepatosplenomegaly Ext: No lower extremity edema.  2+ femoral and 1+ pedal pulses bilaterally. Skin: Warm and dry. Superficial skin wound with eschar and minimal surrounding erythema over the right medial calf.  Lab Results  Component Value Date   WBC 8.6 09/14/2016   HGB 14.6 09/14/2016   HCT 42.2 09/14/2016   MCV 94 09/14/2016   PLT 207 09/14/2016    Lab Results  Component Value Date   NA 141 09/14/2016   K 4.7 09/14/2016   CL 102 09/14/2016   CO2 20 09/14/2016   BUN 38 (H) 09/14/2016   CREATININE 1.71 (H) 09/14/2016   GLUCOSE 86 09/14/2016   ALT 11 09/14/2016    Lab Results  Component Value Date   CHOL 194 09/14/2016   HDL 42 09/14/2016   LDLCALC 121 (H) 09/14/2016   LDLDIRECT 134.0 02/03/2014   TRIG 156 (H) 09/14/2016   CHOLHDL 4.6 09/14/2016   --------------------------------------------------------------------------------------------------  ASSESSMENT AND PLAN: Peripheral arterial disease with nonhealing wound Mr. Kawabata reports long-standing pain in both legs with multiple  different qualities. He also has a superficial wound of the right medial calf that has been present, per his report, for more than a year. His vascular studies demonstrate at least moderate aortoiliac, right SFA, and left popliteal disease as well as occlusion of the right peroneal artery. I am not entirely convinced that his pain is due to claudication, given that his femoral and pedal pulses are palpable on exam. However, given his poor wound healing, we have agreed to evaluate this further. We discussed abdominal aortography with bilateral runoff, CT angiography, and MR angiography at length today, including the risks and benefits of each procedure. Given his chronic kidney disease, we have agreed to abdominal aortography and bilateral runoff; hopefully we can perform most or all of the study with CO2 to minimize IV contrast exposure. We will need to hold warfarin prior to the procedure; I will have the anticoagulation clinic help coordinate this. I do not feel that he needs bridging, as he has a bileaflet mechanical aortic valve, preserved EF, and no history of stroke or atrial fibrillation. We will have him come in at least 4-6 hours before start of the procedure for gentle rehydration, given his history of chronic kidney disease. I will check a CBC and BMP today in anticipation of the procedure. I will also refer Mr. Vallee to the wound care clinic for evaluation of his nonhealing right calf wound.  Chronic diastolic heart failure and history of aortic valve replacement Mr. Mealey appears euvolemic and well compensated today. His largest functional limitation is due to chronic leg pain. We will continue his current medications. He should continue with indefinite anticoagulation, as managed by the anticoagulation clinic.  Carotid artery stenosis Severe left and moderate right carotid artery disease previously noted and evaluated by Dr. Scot Dock; the patient is currently undergoing medical management.  We will plan to repeat carotid Dopplers in early 2019 to evaluate for progression of disease.  Follow-up: To be determined based on results of angiography  Nelva Bush, MD 11/19/2016 4:34 PM

## 2016-11-19 ENCOUNTER — Encounter: Payer: Self-pay | Admitting: Internal Medicine

## 2016-11-19 DIAGNOSIS — I739 Peripheral vascular disease, unspecified: Secondary | ICD-10-CM | POA: Insufficient documentation

## 2016-11-19 DIAGNOSIS — S81809A Unspecified open wound, unspecified lower leg, initial encounter: Secondary | ICD-10-CM | POA: Insufficient documentation

## 2016-11-21 ENCOUNTER — Other Ambulatory Visit: Payer: Self-pay | Admitting: Family Medicine

## 2016-11-21 DIAGNOSIS — M159 Polyosteoarthritis, unspecified: Secondary | ICD-10-CM

## 2016-11-21 NOTE — Telephone Encounter (Signed)
Pt  calling to request refill of:  Name of Medication(s):  oxycodone Last date of OV:  09-14-16 Pharmacy:   Will route refill request to Clinic RN.  Discussed with patient policy to call pharmacy for future refills.  Also, discussed refills may take up to 48 hours to approve or deny.  Renella Cunas

## 2016-11-22 ENCOUNTER — Telehealth: Payer: Self-pay | Admitting: *Deleted

## 2016-11-22 ENCOUNTER — Other Ambulatory Visit: Payer: Self-pay | Admitting: *Deleted

## 2016-11-22 DIAGNOSIS — I739 Peripheral vascular disease, unspecified: Secondary | ICD-10-CM

## 2016-11-22 DIAGNOSIS — I359 Nonrheumatic aortic valve disorder, unspecified: Secondary | ICD-10-CM

## 2016-11-22 NOTE — Telephone Encounter (Signed)
-----   Message from Erskine Emery, [facility] sent at 11/18/2016  1:37 PM EDT ----- Regarding: RE: abdominal aortogram 12/01/16 It looks like he has a mechanical valve and will require lovenox bridging. We would need to see him Thursday or Friday to go over dosing.  I called and left a message to schedule as he did not answer.   Thanks, Georgina Peer  ----- Message ----- From: Katrine Coho, RN Sent: 11/18/2016  12:56 PM To: [facility] Subject: abdominal aortogram 12/01/16                     Dr End is doing an abdominal aortogram with LE runoff 12/01/16. He would like for you to make arrangements for his coumadin for the procedure.  He did not think he needed bridging but would like you to weigh in on that. I will plan to get BMET and CBCd the day he comes to [facility] prior to procedure. Please let me know the day he is coming to [facility] and I will schedule the lab appointment.  Thanks

## 2016-11-22 NOTE — Telephone Encounter (Signed)
Contacted pt to ask him about this medication and him needing a refill early.  He said that he doesn't know exactly what happened but that he only had 2 pills left.  I told him that he was given a 3 month supply and he said that he thought it was strange when he picked up his Rx he had 3 bottles.  Pt as well as his wife were on the phone and they stated that they were going to see if they could locate the other bottles that he received when he picked them up at pharmacy. Will wait to see what pt find outs after looking for them. Routing to PCP as an Pharmacist, hospital. Katharina Caper, April D, [place]

## 2016-11-22 NOTE — Telephone Encounter (Signed)
Pt found the missing medication. He apologized and said he was sorry to cause [place] an inconvenience.  Juan Li, CMA

## 2016-11-22 NOTE — Telephone Encounter (Signed)
Noted and agree. 

## 2016-11-22 NOTE — Telephone Encounter (Signed)
11/24/16 Lovenox bridge appt scheduled with spouse who will assist with injections.

## 2016-11-22 NOTE — Telephone Encounter (Signed)
By my records, he got a three month supply on 09/14/16 and thus this refill request is early.  Without further information, it is denied until 12/15/16.

## 2016-11-24 ENCOUNTER — Ambulatory Visit (INDEPENDENT_AMBULATORY_CARE_PROVIDER_SITE_OTHER): Payer: Medicare Other | Admitting: Pharmacist

## 2016-11-24 ENCOUNTER — Other Ambulatory Visit: Payer: Medicare Other | Admitting: *Deleted

## 2016-11-24 DIAGNOSIS — Z952 Presence of prosthetic heart valve: Secondary | ICD-10-CM

## 2016-11-24 DIAGNOSIS — I359 Nonrheumatic aortic valve disorder, unspecified: Secondary | ICD-10-CM | POA: Diagnosis not present

## 2016-11-24 DIAGNOSIS — Z7901 Long term (current) use of anticoagulants: Secondary | ICD-10-CM | POA: Diagnosis not present

## 2016-11-24 DIAGNOSIS — I6522 Occlusion and stenosis of left carotid artery: Secondary | ICD-10-CM | POA: Diagnosis not present

## 2016-11-24 DIAGNOSIS — I739 Peripheral vascular disease, unspecified: Secondary | ICD-10-CM | POA: Diagnosis not present

## 2016-11-24 LAB — POCT INR: INR: 3.9

## 2016-11-24 MED ORDER — ENOXAPARIN SODIUM 80 MG/0.8ML ~~LOC~~ SOLN: 80.0000 mg | Freq: Two times a day (BID) | SUBCUTANEOUS | 1 refills | Status: DC

## 2016-11-24 NOTE — Patient Instructions (Addendum)
11/24/16: No Coumadin  11/25/16: Last dose of Coumadin.  11/26/16: No Coumadin or Lovenox.  11/27/16: Inject Lovenox 80mg  in the fatty abdominal tissue at least 2 inches from the belly button twice a day about 12 hours apart, 10am and 10pm rotate sites. No Coumadin.  11/28/16: Inject Lovenox in the fatty tissue every 12 hours, 10am and 10pm. No Coumadin.  11/29/16: Inject Lovenox in the fatty tissue every 12 hours, 10am and 10pm. No Coumadin.  11/30/16: Inject Lovenox in the fatty tissue in the morning at 10 am (No PM dose). No Coumadin.  12/01/16: Procedure Day - No Lovenox - Resume Coumadin in the evening or as directed by doctor   8/3: Resume Lovenox inject in the fatty tissue every 12 hours and take Coumadin.  8/4: Inject Lovenox in the fatty tissue every 12 hours and take Coumadin.  8/5: Inject Lovenox in the fatty tissue every 12 hours and take Coumadin.  8/6: Inject Lovenox in the fatty tissue every 12 hours and take Coumadin.  8/7: Inject Lovenox in the fatty tissue every 12 hours and take Coumadin.  8/8: Coumadin appt to check INR.

## 2016-11-25 LAB — CBC WITH DIFFERENTIAL/PLATELET
BASOS ABS: 0 10*3/uL (ref 0.0–0.2)
BASOS: 0 %
EOS (ABSOLUTE): 0.3 10*3/uL (ref 0.0–0.4)
EOS: 3 %
HEMATOCRIT: 41.4 % (ref 37.5–51.0)
Hemoglobin: 14.1 g/dL (ref 13.0–17.7)
Immature Grans (Abs): 0 10*3/uL (ref 0.0–0.1)
Immature Granulocytes: 0 %
LYMPHS ABS: 1.6 10*3/uL (ref 0.7–3.1)
LYMPHS: 18 %
MCH: 32.5 pg (ref 26.6–33.0)
MCHC: 34.1 g/dL (ref 31.5–35.7)
MCV: 95 fL (ref 79–97)
MONOCYTES: 10 %
Monocytes Absolute: 0.9 10*3/uL (ref 0.1–0.9)
NEUTROS ABS: 6.1 10*3/uL (ref 1.4–7.0)
Neutrophils: 69 %
Platelets: 237 10*3/uL (ref 150–379)
RBC: 4.34 x10E6/uL (ref 4.14–5.80)
RDW: 14.8 % (ref 12.3–15.4)
WBC: 8.9 10*3/uL (ref 3.4–10.8)

## 2016-11-25 LAB — BASIC METABOLIC PANEL
BUN / CREAT RATIO: 21 (ref 10–24)
BUN: 30 mg/dL — AB (ref 8–27)
CHLORIDE: 106 mmol/L (ref 96–106)
CO2: 21 mmol/L (ref 20–29)
Calcium: 9.6 mg/dL (ref 8.6–10.2)
Creatinine, Ser: 1.42 mg/dL — ABNORMAL HIGH (ref 0.76–1.27)
GFR, EST AFRICAN AMERICAN: 53 mL/min/{1.73_m2} — AB (ref >59)
GFR, EST NON AFRICAN AMERICAN: 46 mL/min/{1.73_m2} — AB (ref >59)
Glucose: 79 mg/dL (ref 65–99)
Potassium: 5 mmol/L (ref 3.5–5.2)
Sodium: 140 mmol/L (ref 134–144)

## 2016-11-28 ENCOUNTER — Other Ambulatory Visit: Payer: Self-pay | Admitting: Internal Medicine

## 2016-11-28 DIAGNOSIS — I739 Peripheral vascular disease, unspecified: Secondary | ICD-10-CM

## 2016-12-01 ENCOUNTER — Encounter (HOSPITAL_COMMUNITY): Payer: Self-pay | Admitting: General Practice

## 2016-12-01 ENCOUNTER — Encounter (HOSPITAL_COMMUNITY)
Admission: RE | Disposition: A | Discharge status: Home / Self Care | Payer: Self-pay | Source: Ambulatory Visit | Attending: Internal Medicine

## 2016-12-01 ENCOUNTER — Observation Stay (HOSPITAL_COMMUNITY)
Admission: RE | Admit: 2016-12-01 | Discharge: 2016-12-02 | Disposition: A | Discharge status: Home / Self Care | Payer: Medicare Other | Source: Ambulatory Visit | Attending: Internal Medicine | Admitting: Internal Medicine

## 2016-12-01 DIAGNOSIS — I70232 Atherosclerosis of native arteries of right leg with ulceration of calf: Secondary | ICD-10-CM | POA: Insufficient documentation

## 2016-12-01 DIAGNOSIS — M545 Low back pain: Secondary | ICD-10-CM | POA: Insufficient documentation

## 2016-12-01 DIAGNOSIS — N183 Chronic kidney disease, stage 3 (moderate): Secondary | ICD-10-CM | POA: Insufficient documentation

## 2016-12-01 DIAGNOSIS — I2510 Atherosclerotic heart disease of native coronary artery without angina pectoris: Secondary | ICD-10-CM | POA: Diagnosis not present

## 2016-12-01 DIAGNOSIS — M1711 Unilateral primary osteoarthritis, right knee: Secondary | ICD-10-CM | POA: Insufficient documentation

## 2016-12-01 DIAGNOSIS — M19031 Primary osteoarthritis, right wrist: Secondary | ICD-10-CM | POA: Insufficient documentation

## 2016-12-01 DIAGNOSIS — I5032 Chronic diastolic (congestive) heart failure: Secondary | ICD-10-CM | POA: Diagnosis not present

## 2016-12-01 DIAGNOSIS — E7800 Pure hypercholesterolemia, unspecified: Secondary | ICD-10-CM | POA: Insufficient documentation

## 2016-12-01 DIAGNOSIS — Z7901 Long term (current) use of anticoagulants: Secondary | ICD-10-CM | POA: Insufficient documentation

## 2016-12-01 DIAGNOSIS — F0390 Unspecified dementia without behavioral disturbance: Secondary | ICD-10-CM | POA: Diagnosis not present

## 2016-12-01 DIAGNOSIS — Z87891 Personal history of nicotine dependence: Secondary | ICD-10-CM | POA: Diagnosis not present

## 2016-12-01 DIAGNOSIS — I70213 Atherosclerosis of native arteries of extremities with intermittent claudication, bilateral legs: Secondary | ICD-10-CM | POA: Diagnosis not present

## 2016-12-01 DIAGNOSIS — I6523 Occlusion and stenosis of bilateral carotid arteries: Secondary | ICD-10-CM | POA: Insufficient documentation

## 2016-12-01 DIAGNOSIS — G8929 Other chronic pain: Secondary | ICD-10-CM | POA: Insufficient documentation

## 2016-12-01 DIAGNOSIS — I70212 Atherosclerosis of native arteries of extremities with intermittent claudication, left leg: Principal | ICD-10-CM | POA: Insufficient documentation

## 2016-12-01 DIAGNOSIS — I739 Peripheral vascular disease, unspecified: Secondary | ICD-10-CM | POA: Diagnosis present

## 2016-12-01 DIAGNOSIS — Z7982 Long term (current) use of aspirin: Secondary | ICD-10-CM | POA: Insufficient documentation

## 2016-12-01 DIAGNOSIS — L97219 Non-pressure chronic ulcer of right calf with unspecified severity: Secondary | ICD-10-CM | POA: Diagnosis not present

## 2016-12-01 DIAGNOSIS — I130 Hypertensive heart and chronic kidney disease with heart failure and stage 1 through stage 4 chronic kidney disease, or unspecified chronic kidney disease: Secondary | ICD-10-CM | POA: Insufficient documentation

## 2016-12-01 DIAGNOSIS — Z952 Presence of prosthetic heart valve: Secondary | ICD-10-CM | POA: Diagnosis not present

## 2016-12-01 DIAGNOSIS — Z8249 Family history of ischemic heart disease and other diseases of the circulatory system: Secondary | ICD-10-CM | POA: Insufficient documentation

## 2016-12-01 HISTORY — PX: ABDOMINAL AORTAGRAM: SHX5706

## 2016-12-01 HISTORY — DX: Low back pain, unspecified: M54.50

## 2016-12-01 HISTORY — DX: Low back pain: M54.5

## 2016-12-01 HISTORY — PX: LOWER EXTREMITY ANGIOGRAPHY: CATH118251

## 2016-12-01 HISTORY — PX: ABDOMINAL AORTOGRAM: CATH118222

## 2016-12-01 HISTORY — DX: Other chronic pain: G89.29

## 2016-12-01 HISTORY — DX: Peripheral vascular disease, unspecified: I73.9

## 2016-12-01 LAB — PROTIME-INR
INR: 1.15
Prothrombin Time: 14.8 seconds (ref 11.4–15.2)

## 2016-12-01 SURGERY — ABDOMINAL AORTOGRAM
Anesthesia: LOCAL

## 2016-12-01 MED ORDER — FINASTERIDE 5 MG PO TABS
5.0000 mg | ORAL_TABLET | Freq: Every day | ORAL | Status: DC
  Administered 2016-12-02: 11:00:00 5 mg via ORAL
  Filled 2016-12-01: qty 1

## 2016-12-01 MED ORDER — WARFARIN - PHARMACIST DOSING INPATIENT: Freq: Every day | Status: DC

## 2016-12-01 MED ORDER — HEPARIN (PORCINE) IN NACL 2-0.9 UNIT/ML-% IJ SOLN
INTRAMUSCULAR | Status: AC | PRN
  Administered 2016-12-01: 1000 mL

## 2016-12-01 MED ORDER — SODIUM CHLORIDE 0.9 % IV SOLN
INTRAVENOUS | Status: DC
  Administered 2016-12-01: 08:00:00 via INTRAVENOUS

## 2016-12-01 MED ORDER — HEPARIN SODIUM (PORCINE) 1000 UNIT/ML IJ SOLN
INTRAMUSCULAR | Status: DC | PRN
  Administered 2016-12-01: 4000 [IU] via INTRAVENOUS

## 2016-12-01 MED ORDER — PRAVASTATIN SODIUM 40 MG PO TABS: 40.0000 mg | ORAL_TABLET | Freq: Every evening | ORAL | Status: DC

## 2016-12-01 MED ORDER — SODIUM CHLORIDE 0.9% FLUSH: 3.0000 mL | Freq: Two times a day (BID) | INTRAVENOUS | Status: DC

## 2016-12-01 MED ORDER — ONDANSETRON HCL 4 MG/2ML IJ SOLN
4.0000 mg | Freq: Four times a day (QID) | INTRAMUSCULAR | Status: DC | PRN
  Administered 2016-12-02: 02:00:00 4 mg via INTRAVENOUS
  Filled 2016-12-01: qty 2

## 2016-12-01 MED ORDER — DONEPEZIL HCL 5 MG PO TABS
5.0000 mg | ORAL_TABLET | Freq: Every evening | ORAL | Status: DC
  Administered 2016-12-01: 5 mg via ORAL
  Filled 2016-12-01 (×2): qty 1

## 2016-12-01 MED ORDER — SODIUM CHLORIDE 0.9 % IV SOLN: INTRAVENOUS | Status: AC

## 2016-12-01 MED ORDER — ASPIRIN 81 MG PO CHEW
81.0000 mg | CHEWABLE_TABLET | ORAL | Status: AC
  Administered 2016-12-01: 81 mg via ORAL

## 2016-12-01 MED ORDER — SODIUM CHLORIDE 0.9% FLUSH
3.0000 mL | INTRAVENOUS | Status: DC | PRN
Start: 2016-12-01 — End: 2016-12-01

## 2016-12-01 MED ORDER — ASPIRIN 81 MG PO CHEW
CHEWABLE_TABLET | ORAL | Status: AC
  Administered 2016-12-01: 81 mg via ORAL
  Filled 2016-12-01: qty 1

## 2016-12-01 MED ORDER — SODIUM CHLORIDE 0.9 % IV SOLN: INTRAVENOUS | Status: DC

## 2016-12-01 MED ORDER — ACETAMINOPHEN 325 MG PO TABS
650.0000 mg | ORAL_TABLET | ORAL | Status: DC | PRN
  Administered 2016-12-01 – 2016-12-02 (×3): 650 mg via ORAL
  Filled 2016-12-01 (×3): qty 2

## 2016-12-01 MED ORDER — MEMANTINE HCL 5 MG PO TABS
5.0000 mg | ORAL_TABLET | Freq: Two times a day (BID) | ORAL | Status: DC
  Administered 2016-12-01 – 2016-12-02 (×2): 5 mg via ORAL
  Filled 2016-12-01 (×2): qty 1

## 2016-12-01 MED ORDER — VERAPAMIL HCL 2.5 MG/ML IV SOLN
INTRAVENOUS | Status: DC | PRN
  Administered 2016-12-01: 15:00:00 via INTRA_ARTERIAL

## 2016-12-01 MED ORDER — SODIUM CHLORIDE 0.9 % IV SOLN: 250.0000 mL | INTRAVENOUS | Status: DC | PRN

## 2016-12-01 MED ORDER — SODIUM CHLORIDE 0.9% FLUSH: 3.0000 mL | INTRAVENOUS | Status: DC | PRN

## 2016-12-01 MED ORDER — HYDRALAZINE HCL 20 MG/ML IJ SOLN
INTRAMUSCULAR | Status: DC | PRN
  Administered 2016-12-01: 5 mg via INTRAVENOUS

## 2016-12-01 MED ORDER — FENTANYL CITRATE (PF) 100 MCG/2ML IJ SOLN
INTRAMUSCULAR | Status: DC | PRN
  Administered 2016-12-01: 25 ug via INTRAVENOUS

## 2016-12-01 MED ORDER — ENOXAPARIN SODIUM 80 MG/0.8ML ~~LOC~~ SOLN: 80.0000 mg | Freq: Two times a day (BID) | SUBCUTANEOUS | 1 refills | Status: DC

## 2016-12-01 MED ORDER — VERAPAMIL HCL 2.5 MG/ML IV SOLN
INTRAVENOUS | Status: AC
  Filled 2016-12-01: qty 2

## 2016-12-01 MED ORDER — ASPIRIN EC 81 MG PO TBEC
81.0000 mg | DELAYED_RELEASE_TABLET | Freq: Every morning | ORAL | Status: DC
  Administered 2016-12-02: 81 mg via ORAL
  Filled 2016-12-01: qty 1

## 2016-12-01 MED ORDER — HEPARIN SODIUM (PORCINE) 1000 UNIT/ML IJ SOLN
INTRAMUSCULAR | Status: AC
  Filled 2016-12-01: qty 1

## 2016-12-01 MED ORDER — HEPARIN (PORCINE) IN NACL 2-0.9 UNIT/ML-% IJ SOLN
INTRAMUSCULAR | Status: AC
  Filled 2016-12-01: qty 1000

## 2016-12-01 MED ORDER — HYDROCHLOROTHIAZIDE 25 MG PO TABS
12.5000 mg | ORAL_TABLET | Freq: Every day | ORAL | Status: DC
  Administered 2016-12-02: 10:00:00 12.5 mg via ORAL
  Filled 2016-12-01: qty 1

## 2016-12-01 MED ORDER — LIDOCAINE HCL (PF) 1 % IJ SOLN
INTRAMUSCULAR | Status: AC
  Filled 2016-12-01: qty 30

## 2016-12-01 MED ORDER — HYDRALAZINE HCL 20 MG/ML IJ SOLN
INTRAMUSCULAR | Status: AC
  Filled 2016-12-01: qty 1

## 2016-12-01 MED ORDER — WARFARIN SODIUM 5 MG PO TABS
5.0000 mg | ORAL_TABLET | Freq: Once | ORAL | Status: AC
  Administered 2016-12-01: 5 mg via ORAL
  Filled 2016-12-01: qty 1

## 2016-12-01 MED ORDER — ENALAPRIL MALEATE 10 MG PO TABS
10.0000 mg | ORAL_TABLET | Freq: Two times a day (BID) | ORAL | Status: DC
  Administered 2016-12-01 – 2016-12-02 (×2): 10 mg via ORAL
  Filled 2016-12-01 (×2): qty 1

## 2016-12-01 MED ORDER — HYDRALAZINE HCL 10 MG PO TABS
10.0000 mg | ORAL_TABLET | Freq: Four times a day (QID) | ORAL | Status: DC | PRN
  Administered 2016-12-01: 10 mg via ORAL
  Filled 2016-12-01: qty 1

## 2016-12-01 MED ORDER — HYDRALAZINE HCL 20 MG/ML IJ SOLN
5.0000 mg | Freq: Four times a day (QID) | INTRAMUSCULAR | Status: DC | PRN
  Administered 2016-12-02: 01:00:00 5 mg via INTRAVENOUS
  Filled 2016-12-01: qty 1

## 2016-12-01 MED ORDER — FENTANYL CITRATE (PF) 100 MCG/2ML IJ SOLN
INTRAMUSCULAR | Status: AC
  Filled 2016-12-01: qty 2

## 2016-12-01 MED ORDER — LIDOCAINE HCL (PF) 1 % IJ SOLN
INTRAMUSCULAR | Status: DC | PRN
  Administered 2016-12-01: 2 mL via INTRADERMAL

## 2016-12-01 MED ORDER — TAMSULOSIN HCL 0.4 MG PO CAPS
0.4000 mg | ORAL_CAPSULE | Freq: Every day | ORAL | Status: DC
  Administered 2016-12-02: 10:00:00 0.4 mg via ORAL
  Filled 2016-12-01: qty 1

## 2016-12-01 SURGICAL SUPPLY — 12 items
CATH ANGIO 5F PIGTAIL 100CM (CATHETERS) ×1 IMPLANT
CATH EXPO 5F PIG 125CM (CATHETERS) ×1 IMPLANT
CATH INFINITI VERT 5FR 125CM (CATHETERS) ×1 IMPLANT
FILTER CO2 0.2 MICRON (VASCULAR PRODUCTS) ×1 IMPLANT
GLIDESHEATH SLEND SS 6F .021 (SHEATH) ×1 IMPLANT
KIT PV (KITS) ×3 IMPLANT
RESERVOIR CO2 (VASCULAR PRODUCTS) ×1 IMPLANT
SET FLUSH CO2 (MISCELLANEOUS) ×1 IMPLANT
SYRINGE MEDRAD AVANTA MACH 7 (SYRINGE) ×1 IMPLANT
TRANSDUCER W/STOPCOCK (MISCELLANEOUS) ×3 IMPLANT
TRAY PV CATH (CUSTOM PROCEDURE TRAY) ×3 IMPLANT
WIRE HI TORQ VERSACORE J 260CM (WIRE) ×1 IMPLANT

## 2016-12-01 NOTE — Progress Notes (Signed)
ANTICOAGULATION CONSULT NOTE - Initial Consult  Pharmacy Consult for Coumadin Indication: hx of mechanical aortic valve  No Known Allergies  Patient Measurements: Height: _0  (167.6 cm) Weight: 170 lb (77.1 kg) IBW/kg (Calculated) : 63.8  Vital Signs: BP: 164/89 (08/02 1735) Pulse Rate: 77 (08/02 1735)  Labs:  Recent Labs  12/01/16 0728  LABPROT 14.8  INR 1.15    Estimated Creatinine Clearance: 38.5 mL/min (A) (by C-G formula based on SCr of 1.42 mg/dL (H)).   Medical History: Past Medical History:  Diagnosis Date  . Arthritis 02-15-12   osteoarthritis,right knee, pain right wrist.,  . Carotid artery disease ([place])    Carotid US 1/18: R 40-59; L 80-99  . Complication of anesthesia 02-15-12   very confused, and very slow to awaken after anesthesia  . Coronary artery disease 02-15-12   Heart valve replaced   . Hypercholesterolemia 02-15-12  . Hypertension   . S/P AVR (aortic valve replacement)    Mechanical St. Jude AVR // Echo 12/17: Moderate LVH, EF 60-65, normal wall motion, grade 1 diastolic dysfunction, mechanical AVR okay with mean gradient 8 mmHg, MAC, mild mitral stenosis (mean gradient 2 mmHg), mild MR, mild RAE, PASP 25    Assessment: 85 YOM with St. Jude mechanical aortic valve, on chronic coumadin. He is admitted today for abdominal aortogram. Coumadin has been on hold prior to procedure. Now to restart. INR 1.15. BL CBC wnl.  PTA dose: 2.5 mg on Thurs/Sun, 42m on AODs (INR goal 2.5-3.5)   Goal of Therapy:  INR 2.5 - 3.5 Monitor platelets by anticoagulation protocol: Yes   Plan:  Coumadin 5 mg po x 1 Daily INR  MMaryanna Shape PharmD, BCPS  Clinical Pharmacist  Pager: [phone]  12/01/2016,6:44 PM

## 2016-12-01 NOTE — Brief Op Note (Signed)
Brief Catheterization Note  Date: 12/01/2016 Time: 3:55 PM  PATIENT:  Juan Li  83 y.o. male  PRE-OPERATIVE DIAGNOSIS:  Claudication and non-healing right leg wound  POST-OPERATIVE DIAGNOSIS:  Same  PROCEDURE:  Procedure(s): ABDOMINAL AORTOGRAM (N/A) Lower Extremity Angiography (Bilateral)  SURGEON:  Surgeon(s) and Role:    * Faelyn Sigler, MD - Primary  FINDINGS: 1. 70% right ostial common iliac artery stenosis with 30 mmHg pressure gradient. 2. 80% left popliteal artery (P1) stenosis. 3. 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  RECOMMENDATIONS: 1. Medical therapy, including walking. 2. If symptoms persist, consider intervention to left popliteal and right common iliac arteries.  Nelva Bush, MD [facility] Pager: [phone]

## 2016-12-01 NOTE — H&P (View-Only) (Signed)
Follow-up Outpatient Visit Date: 11/18/2016  Primary Care Provider: Zenia Resides, MD [place] [address]  Chief Complaint: Leg pain and poorly healing right calf wound  HPI:  Juan Li is a 83 y.o. year-old male with history of aortic stenosis status post mechanical aortic valve replacement (1993), heart failure with preserved ejection fraction, carotid artery stenosis, chronic kidney disease stage III, and dementia, who presents for follow-up of leg pain and poorly healing right calf wound. He was previously followed by Dr. Aundra Dubin. I met him on 10/21/16, at which time he was doing well with the exception of pain in both legs and a poorly healing wound along the right medial calf. He reports that for over a year, he has had significant pain in both legs. He describes a burning sensation in the soles of his feet that worsens with any activity. However, it is also present at rest. He also notes considerable aching in his calves, thighs, knees, and hips. He also has intermittent leg swelling that seems dependent. At this time, he is only able to walk about 10 yards before needing to stop due to leg pain. He also has chronic low back pain, which has been stable. He denies chest pain, palpitations, lightheadedness, and shortness of breath. He remains compliant with warfarin and has not had any bleeding.  After our last visit, we performed a lower extremity vascular studies, which demonstrated normal ABIs but evidence of at least moderate bilateral inflow and outflow disease. Two-vessel runoff was also noted on the right.   Of note, Juan Li wife suffered a stroke earlier this year. She is back at home but continues to perform outpatient rehabilitation. He is her primary caregiver.  --------------------------------------------------------------------------------------------------  Past Medical History:  Diagnosis Date  . Arthritis 02-15-12   osteoarthritis,right knee, pain right wrist.,  . Carotid artery disease ([place])    Carotid US 1/18: R 40-59; L 80-99  . Complication of anesthesia 02-15-12   very confused, and very slow to awaken after anesthesia  . Coronary artery disease 02-15-12   Heart valve replaced   . Hypercholesterolemia 02-15-12  . Hypertension   . S/P AVR (aortic valve replacement)    Mechanical St. Jude AVR // Echo 12/17: Moderate LVH, EF 60-65, normal wall motion, grade 1 diastolic dysfunction, mechanical AVR okay with mean gradient 8 mmHg, MAC, mild mitral stenosis (mean gradient 2 mmHg), mild MR, mild RAE, PASP 25   Past Surgical History:  Procedure Laterality Date  . JOINT REPLACEMENT    . MECHANICAL AORTIC VALVE REPLACEMENT    . NECK SURGERY    . SHOULDER OPEN ROTATOR CUFF REPAIR  02-15-12   left  . TOTAL KNEE ARTHROPLASTY  02/20/2012   Procedure: TOTAL KNEE ARTHROPLASTY;  Surgeon: Gearlean Alf, MD;  Location: [facility];  Service: Orthopedics;  Laterality: Right;    Recent CV Pertinent Labs: Lab Results  Component Value Date   CHOL 194 09/14/2016   HDL 42 09/14/2016   LDLCALC 121 (H) 09/14/2016   LDLDIRECT 134.0 02/03/2014   TRIG 156 (H) 09/14/2016   CHOLHDL 4.6 09/14/2016   CHOLHDL 4.7 06/10/2015   INR 3.0 11/16/2016   INR 7.1 (>) 05/31/2016   INR 2.8 07/16/2010   K 4.7 09/14/2016   BUN 38 (H) 09/14/2016   CREATININE 1.71 (H) 09/14/2016   CREATININE 1.63 (H) 03/29/2016    Past medical and surgical history were reviewed and updated in EPIC.  Current Meds  Medication Sig  .  aspirin EC 81 MG tablet Take 81 mg by mouth every morning.   . donepezil (ARICEPT) 5 MG tablet Take 1 tablet (5 mg total) by mouth daily.  . enalapril (VASOTEC) 10 MG tablet TAKE 1 TABLET (10 MG TOTAL) BY MOUTH 2 (TWO) TIMES DAILY.  . finasteride (PROSCAR) 5 MG tablet TAKE 1 TABLET (5 MG TOTAL) BY MOUTH DAILY.  . fluorouracil (EFUDEX) 5 % cream Apply topically daily. To scalp and ears.  Stop when sore.  .  hydrochlorothiazide (HYDRODIURIL) 25 MG tablet Take 0.5 tablets (12.5 mg total) by mouth daily.  Marland Kitchen HYDROcodone-acetaminophen (NORCO) 10-325 MG tablet Take 1 tablet by mouth every 6 (six) hours as needed for moderate pain or severe pain.  Marland Kitchen JANTOVEN 5 MG tablet TAKE AS DIRECTED  . memantine (NAMENDA) 5 MG tablet Take 1 tablet (5 mg total) by mouth 2 (two) times daily.  . potassium chloride SA (K-DUR,KLOR-CON) 20 MEQ tablet Take 20 mEq by mouth every other day.  . pravastatin (PRAVACHOL) 40 MG tablet Take 1 tablet (40 mg total) by mouth every evening.  . tamsulosin (FLOMAX) 0.4 MG CAPS capsule Take 1 capsule (0.4 mg total) by mouth daily.    Allergies: Patient has no known allergies.  Social History   Social History  . Marital status: Married    Spouse name: [name]  . Number of children: 3  . Years of education: N/A   Occupational History  . 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 Smoking status: Former Smoker    Quit date: 08/10/1995  . Smokeless tobacco: Never Used  . Alcohol use 0.0 oz/week     Comment: 1-2x a year  . Drug use: No  . Sexual activity: Yes   Other Topics Concern  . Not on file   Social History Narrative   Health Care POA: Wife, [place]   Emergency Contact: wife, [place], [phone] (c)   Kushi Kun of Life Plan:   Who lives with you: Patient lives with wife.   Any pets:    Diet: Patient has a varied diet of protein, starch, and vegetables.   Exercise: Patient does not have a regular exercise plan, afraid of falling.   Seatbelts: Patient reports wearing seatbelt when in vehicle.    Sun Exposure/Protection: Patient does not wear sun protection.   Hobbies: Patient likes to do yard work.          Family History  Problem Relation Age of Onset  . Heart disease Mother   . Hyperlipidemia Mother   . Hypertension Mother   . Varicose Veins Mother   . Hyperlipidemia Father   . Hypertension Father   . Heart  attack Father     Review of Systems: Review of Systems  Cardiovascular: Positive for claudication.  Musculoskeletal: Positive for back pain and joint pain.  All other systems reviewed and are negative.  --------------------------------------------------------------------------------------------------  Physical Exam: BP (!) 130/42   Pulse 62   Ht 5' 6" (1.676 m)   Wt 186 lb (84.4 kg)   SpO2 95%   BMI 30.02 kg/m   General:  Overweight, elderly man, seated comfortably in the exam room HEENT: No conjunctival pallor or scleral icterus.  Moist mucous membranes.  OP clear. Neck: Supple without lymphadenopathy, thyromegaly, JVD, or HJR. Lungs: Normal work of breathing.  Clear to auscultation bilaterally without wheezes or crackles. Heart: Regular rate and rhythm without murmurs, rubs, or gallops. Mechanical S2 noted. Non-displaced PMI. Abd: Bowel sounds  present.  Soft, NT/ND without hepatosplenomegaly Ext: No lower extremity edema.  2+ femoral and 1+ pedal pulses bilaterally. Skin: Warm and dry. Superficial skin wound with eschar and minimal surrounding erythema over the right medial calf.  Lab Results  Component Value Date   WBC 8.6 09/14/2016   HGB 14.6 09/14/2016   HCT 42.2 09/14/2016   MCV 94 09/14/2016   PLT 207 09/14/2016    Lab Results  Component Value Date   NA 141 09/14/2016   K 4.7 09/14/2016   CL 102 09/14/2016   CO2 20 09/14/2016   BUN 38 (H) 09/14/2016   CREATININE 1.71 (H) 09/14/2016   GLUCOSE 86 09/14/2016   ALT 11 09/14/2016    Lab Results  Component Value Date   CHOL 194 09/14/2016   HDL 42 09/14/2016   LDLCALC 121 (H) 09/14/2016   LDLDIRECT 134.0 02/03/2014   TRIG 156 (H) 09/14/2016   CHOLHDL 4.6 09/14/2016   --------------------------------------------------------------------------------------------------  ASSESSMENT AND PLAN: Peripheral arterial disease with nonhealing wound Juan Li reports long-standing pain in both legs with multiple  different qualities. He also has a superficial wound of the right medial calf that has been present, per his report, for more than a year. His vascular studies demonstrate at least moderate aortoiliac, right SFA, and left popliteal disease as well as occlusion of the right peroneal artery. I am not entirely convinced that his pain is due to claudication, given that his femoral and pedal pulses are palpable on exam. However, given his poor wound healing, we have agreed to evaluate this further. We discussed abdominal aortography with bilateral runoff, CT angiography, and MR angiography at length today, including the risks and benefits of each procedure. Given his chronic kidney disease, we have agreed to abdominal aortography and bilateral runoff; hopefully we can perform most or all of the study with CO2 to minimize IV contrast exposure. We will need to hold warfarin prior to the procedure; I will have the anticoagulation clinic help coordinate this. I do not feel that he needs bridging, as he has a bileaflet mechanical aortic valve, preserved EF, and no history of stroke or atrial fibrillation. We will have him come in at least 4-6 hours before start of the procedure for gentle rehydration, given his history of chronic kidney disease. I will check a CBC and BMP today in anticipation of the procedure. I will also refer Juan Li to the wound care clinic for evaluation of his nonhealing right calf wound.  Chronic diastolic heart failure and history of aortic valve replacement Juan Li appears euvolemic and well compensated today. His largest functional limitation is due to chronic leg pain. We will continue his current medications. He should continue with indefinite anticoagulation, as managed by the anticoagulation clinic.  Carotid artery stenosis Severe left and moderate right carotid artery disease previously noted and evaluated by Dr. Scot Dock; the patient is currently undergoing medical management.  We will plan to repeat carotid Dopplers in early 2019 to evaluate for progression of disease.  Follow-up: To be determined based on results of angiography  Nelva Bush, MD 11/19/2016 4:34 PM

## 2016-12-01 NOTE — Progress Notes (Signed)
Report called to Hassell Done

## 2016-12-01 NOTE — Interval H&P Note (Signed)
History and Physical Interval Note:  12/01/2016 2:41 PM  Juan Li  has presented today for surgery, with the diagnosis of claudication and non-healing right calf wound.  The various methods of treatment have been discussed with the patient and family. After consideration of risks, benefits and other options for treatment, the patient has consented to  Procedure(s): Abdominal Aortogram w/Lower Extremity (N/A) as a surgical intervention .  The patient's history has been reviewed, patient examined, no change in status, stable for surgery.  I have reviewed the patient's chart and labs.  Questions were answered to the patient's satisfaction.     Jorie Zee

## 2016-12-01 NOTE — Progress Notes (Signed)
Dr. Saunders Revel in to speak with pt and wife. He is to admit pt overnight. Pt will be transferred to holding area bay 6 to await inpt bed

## 2016-12-01 NOTE — Progress Notes (Signed)
Patient has several sores that are reccuring on right lower leg.  Patient stated that the sores bleed at times and come and go.  Dr. Saunders Revel aware.

## 2016-12-02 ENCOUNTER — Encounter (HOSPITAL_COMMUNITY): Payer: Self-pay | Admitting: Internal Medicine

## 2016-12-02 DIAGNOSIS — L97219 Non-pressure chronic ulcer of right calf with unspecified severity: Secondary | ICD-10-CM | POA: Diagnosis not present

## 2016-12-02 DIAGNOSIS — I10 Essential (primary) hypertension: Secondary | ICD-10-CM | POA: Diagnosis not present

## 2016-12-02 DIAGNOSIS — I130 Hypertensive heart and chronic kidney disease with heart failure and stage 1 through stage 4 chronic kidney disease, or unspecified chronic kidney disease: Secondary | ICD-10-CM | POA: Diagnosis not present

## 2016-12-02 DIAGNOSIS — I739 Peripheral vascular disease, unspecified: Secondary | ICD-10-CM

## 2016-12-02 DIAGNOSIS — I70232 Atherosclerosis of native arteries of right leg with ulceration of calf: Secondary | ICD-10-CM | POA: Diagnosis not present

## 2016-12-02 DIAGNOSIS — I70212 Atherosclerosis of native arteries of extremities with intermittent claudication, left leg: Secondary | ICD-10-CM | POA: Diagnosis not present

## 2016-12-02 DIAGNOSIS — N183 Chronic kidney disease, stage 3 (moderate): Secondary | ICD-10-CM

## 2016-12-02 DIAGNOSIS — I5032 Chronic diastolic (congestive) heart failure: Secondary | ICD-10-CM

## 2016-12-02 LAB — CBC
HCT: 39.4 % (ref 39.0–52.0)
Hemoglobin: 13.8 g/dL (ref 13.0–17.0)
MCH: 32.5 pg (ref 26.0–34.0)
MCHC: 35 g/dL (ref 30.0–36.0)
MCV: 92.9 fL (ref 78.0–100.0)
PLATELETS: 215 10*3/uL (ref 150–400)
RBC: 4.24 MIL/uL (ref 4.22–5.81)
RDW: 13.6 % (ref 11.5–15.5)
WBC: 13.6 10*3/uL — ABNORMAL HIGH (ref 4.0–10.5)

## 2016-12-02 LAB — BASIC METABOLIC PANEL
Anion gap: 8 (ref 5–15)
BUN: 25 mg/dL — ABNORMAL HIGH (ref 6–20)
CALCIUM: 9.1 mg/dL (ref 8.9–10.3)
CO2: 21 mmol/L — AB (ref 22–32)
CREATININE: 1.48 mg/dL — AB (ref 0.61–1.24)
Chloride: 107 mmol/L (ref 101–111)
GFR calc non Af Amer: 42 mL/min — ABNORMAL LOW (ref >60)
GFR, EST AFRICAN AMERICAN: 49 mL/min — AB (ref >60)
GLUCOSE: 117 mg/dL — AB (ref 65–99)
Potassium: 4 mmol/L (ref 3.5–5.1)
Sodium: 136 mmol/L (ref 135–145)

## 2016-12-02 LAB — PROTIME-INR
INR: 1.05
PROTHROMBIN TIME: 13.8 s (ref 11.4–15.2)

## 2016-12-02 MED ORDER — ANGIOPLASTY BOOK
Freq: Once | Status: AC
  Administered 2016-12-02: 04:00:00 1
  Filled 2016-12-02: qty 1

## 2016-12-02 MED ORDER — WARFARIN SODIUM 5 MG PO TABS: 5.0000 mg | ORAL_TABLET | Freq: Every day | ORAL | Status: DC

## 2016-12-02 MED ORDER — WARFARIN SODIUM 5 MG PO TABS: 5.0000 mg | ORAL_TABLET | ORAL | 0 refills | Status: DC

## 2016-12-02 NOTE — Care Management Note (Signed)
Case Management Note  Patient Details  Name: Juan Li MRN: [id number] Date of Birth: 05-09-1931  Subjective/Objective:   From home with wife, he uses a 4 prong cane, he has a PCP and medication coverage. S/p abd aortogram and le angiography.                   Action/Plan: NCM will follow for dc needs.   Expected Discharge Date:  12/02/16               Expected Discharge Plan:  Home/Self Care  In-House Referral:     Discharge planning Services  CM Consult  Post Acute Care Choice:    Choice offered to:     DME Arranged:    DME Agency:     HH Arranged:    HH Agency:     Status of Service:  Completed, signed off  If discussed at [employer] of Stay Meetings, dates discussed:    Additional Comments:  Zenon Mayo, RN 12/02/2016, 10:01 AM

## 2016-12-02 NOTE — Care Management Obs Status (Signed)
[place] NOTIFICATION   Patient Details  Name: Juan Li MRN: [id number] Date of Birth: August 10, 1931   Medicare Observation Status Notification Given:  Yes    Zenon Mayo, RN 12/02/2016, 9:59 AM

## 2016-12-02 NOTE — Discharge Summary (Signed)
Discharge Summary    Patient ID: TRESHAUN CARRICO,  MRN: [id number], DOB/AGE: [birth year] 81 y.o.  Admit date: 12/01/2016 Discharge date: 12/02/2016  Primary Care Provider: Madison Hickman A Primary Cardiologist: Dr. Saunders Revel (Dr. Aundra Dubin previously)  Discharge Diagnoses    Principal Problem:   Claudication in peripheral vascular disease [facility]) Active Problems:   Claudication [facility])   Carotid artery disease   Dizziness   HLD   CKD stage III   Chronic diastolic CHF with hx of mechanical aortic valve replacement   Allergies No Known Allergies  Diagnostic Studies/Procedures    ABDOMINAL AORTOGRAM  Lower Extremity Angiography  Conclusion   Conclusions: 1. Significant right common iliac (70%) and left popliteal (80-90%) stenoses. 2. Mild to moderate disease involving the left common iliac artery and ostial superficial femoral artery. 3. Tortuous right external iliac artery with small, non-flow-limiting dissection (likely catheter induced).  Recommendations: 1. Overnight observation to ensure no vascular compromise from iatrogenic right external iliac artery dissection. 2. If no bleeding or evidence of vascular compromise, we will plan to restart enoxaparin tomorrow; warfarin can begin tonight per pharmacy, given history of mechanical aortic valve. 3. Obtain BMP in AM to ensure stable renal function. 4. Outpatient follow-up with me in ~2 weeks. 5. Consider supervised walking program after clinic follow-up. If pain persists, PTA to right common iliac and left popliteal arteries will be considered.      History of Present Illness     83 y.o. male aortic stenosis status post mechanical aortic valve replacement (1993), heart failure with preserved ejection fraction, carotid artery stenosis, chronic kidney disease stage III, and dementia, who presented 12/01/16 for PV angiogram for leg pain bilaterally and poorly healing right calf wound.  Lower extremity vascular studies  demonstrated normal ABIs but evidence of at least moderate bilateral inflow and outflow disease. Two-vessel runoff was also noted on the right.   Hospital Course     Consultants: None  . PAD with non healing R calf wound - Angiogram showed ight common iliac (70%) and left popliteal (80-90%) stenoses. Mild to moderate disease involving the left common iliac artery and ostial superficial femoral artery. Medical management with walking program. Plan for PTA if pain persists. F/u in would clinic 12/05/16.   2. Chronic diastolic CHF with hx of mechanical aortic valve replacement (1993) - Euvolemic. Continue coumadin. Resume Levonox at discharge given INR of 1.05. F/u in coumadin clinic as scheduled 12/05/16.   3. Carotid artery disease - Severe left and moderate right carotid artery disease. Seen by Dr. Scot Dock 06/2016 and recommended medical therapy.   4. HLD - 09/14/2016: Cholesterol, Total 194; HDL 42; LDL Calculated 121; Triglycerides 156  - Continue statin   5. CAD stage III - Scr stable at baseline of 1.4  The patient has been seen by Dr. Irish Lack today and deemed ready for discharge home. All follow-up appointments have been scheduled. Discharge medications are listed below.  _____________   Discharge Vitals Blood pressure (!) 103/46, pulse 77, temperature 98.6 F (37 C), temperature source Oral, resp. rate 19, height 5\' 6"  (1.676 m), weight 166 lb 7.2 oz (75.5 kg), SpO2 100 %.  Filed Weights   12/01/16 0621 12/02/16 0105  Weight: 170 lb (77.1 kg) 166 lb 7.2 oz (75.5 kg)    Labs & Radiologic Studies     CBC  Recent Labs  12/02/16 0424  WBC 13.6*  HGB 13.8  HCT 39.4  MCV 92.9  PLT 366   Basic Metabolic  Panel  Recent Labs  12/02/16 0424  NA 136  K 4.0  CL 107  CO2 21*  GLUCOSE 117*  BUN 25*  CREATININE 1.48*  CALCIUM 9.1     Disposition   Pt is being discharged home today in good condition.  Follow-up Plans & Appointments    Follow-up Information     End, Harrell Gave, MD. Go on 12/16/2016.   Specialty:  Cardiology Why:  @11 :40am  for post hospital  Contact information: [place] [address] [phone]        [facility] [employer]. Go on 12/05/2016.   Specialty:  Cardiology Why:  @ 1:15pm for INR check  Contact information: [address], [place] [id number]         Discharge Instructions    Diet - low sodium heart healthy    Complete by:  As directed    Discharge instructions    Complete by:  As directed    No driving for 48 hours. No lifting over 5 lbs for 1 week. No sexual activity for 1 week. Keep procedure site clean & dry. If you notice increased pain, swelling, bleeding or pus, call/return!  You may shower, but no soaking baths/hot tubs/pools for 1 week.   Increase activity slowly    Complete by:  As directed       Discharge Medications   Current Discharge Medication List    CONTINUE these medications which have CHANGED   Details  enoxaparin (LOVENOX) 80 MG/0.8ML injection Inject 0.8 mLs (80 mg total) into the skin every 12 (twelve) hours. Qty: 20 Syringe, Refills: 1    warfarin (JANTOVEN) 5 MG tablet Take 1 tablet (5 mg total) by mouth See admin instructions. Take 5 mg daily my mouth.Check INR 12/05/16. Qty: 30 tablet, Refills: 0      CONTINUE these medications which have NOT CHANGED   Details  aspirin EC 81 MG tablet Take 81 mg by mouth every morning.    Associated Diagnoses: Aortic valve disorders; Essential hypertension, benign    donepezil (ARICEPT) 5 MG tablet Take 1 tablet (5 mg total) by mouth daily. Qty: 90 tablet, Refills: 3    enalapril (VASOTEC) 10 MG tablet TAKE 1 TABLET (10 MG TOTAL) BY MOUTH 2 (TWO) TIMES DAILY. Qty: 180 tablet, Refills: 2   Associated Diagnoses: Essential hypertension, benign    hydrochlorothiazide (HYDRODIURIL) 25 MG tablet Take 0.5 tablets (12.5 mg total) by mouth daily. Qty: 90 tablet,  Refills: 3    HYDROcodone-acetaminophen (NORCO) 10-325 MG tablet Take 1 tablet by mouth every 6 (six) hours as needed for moderate pain or severe pain. Qty: 360 tablet, Refills: 0   Associated Diagnoses: Generalized osteoarthrosis, involving multiple sites    memantine (NAMENDA) 5 MG tablet Take 1 tablet (5 mg total) by mouth 2 (two) times daily. Qty: 180 tablet, Refills: 3    pravastatin (PRAVACHOL) 40 MG tablet Take 1 tablet (40 mg total) by mouth every evening. Qty: 90 tablet, Refills: 3    tamsulosin (FLOMAX) 0.4 MG CAPS capsule Take 1 capsule (0.4 mg total) by mouth daily. Qty: 90 capsule, Refills: 3    finasteride (PROSCAR) 5 MG tablet TAKE 1 TABLET (5 MG TOTAL) BY MOUTH DAILY. Qty: 90 tablet, Refills: 3    fluorouracil (EFUDEX) 5 % cream Apply topically daily. To scalp and ears.  Stop when sore. Qty: 40 g, Refills: 0   Associated Diagnoses: Actinic keratosis    potassium  chloride SA (K-DUR,KLOR-CON) 20 MEQ tablet Take 20 mEq by mouth every other day.          Outstanding Labs/Studies   None  Duration of Discharge Encounter   Greater than 30 minutes including physician time.  Signed, Bhagat,Bhavinkumar PA-C 12/02/2016, 10:58 AM   I have examined the patient and reviewed assessment and plan and discussed with patient.  Agree with above as stated.  Right leg circulation appears intact.  Okay for discharge today with follow-up with Dr. Saunders Revel. Left radial site intact.  Somewhat unsteady on his feet.  He will need to use his cane to try to avoid falls.   Larae Grooms

## 2016-12-02 NOTE — Progress Notes (Signed)
TR BAND REMOVAL  LOCATION:    L radial  DEFLATED PER PROTOCOL:    Yes  TIME BAND OFF / DRESSING APPLIED:   21:20   SITE UPON ARRIVAL:    Level  0  SITE AFTER BAND REMOVAL:    Level 0  CIRCULATION SENSATION AND MOVEMENT:    Within Normal Limits   Yes  COMMENTS:  Pt tolerated procedure well. VSS. Post removal instructions given. Pt will require frequent reinforcement due to Dementia

## 2016-12-02 NOTE — Progress Notes (Signed)
[place] for Coumadin Indication: hx of mechanical aortic valve  No Known Allergies  Patient Measurements: Height: 5\' 6"  (167.6 cm) Weight: 166 lb 7.2 oz (75.5 kg) IBW/kg (Calculated) : 63.8  Vital Signs: Temp: 98.6 F (37 C) (08/03 0720) Temp Source: Oral (08/03 0720) BP: 118/71 (08/03 0720) Pulse Rate: 77 (08/03 0720)  Labs:  Recent Labs  12/01/16 0728 12/02/16 0424  HGB  --  13.8  HCT  --  39.4  PLT  --  215  LABPROT 14.8 13.8  INR 1.15 1.05  CREATININE  --  1.48*    Estimated Creatinine Clearance: 34.1 mL/min (A) (by C-G formula based on SCr of 1.48 mg/dL (H)).    Assessment: 65 YOM with St. Jude mechanical aortic valve, on chronic coumadin. He is admitted today for abdominal aortogram. Coumadin has been on hold prior to procedure. Now to restart. INR 1.15. BL CBC wnl.  PTA dose: 2.5 mg on Thurs/Sun, 5mg  on AODs (INR goal 2.5-3.5)  Planning home today with Lovenox bridge   Goal of Therapy:  INR 2.5 - 3.5 Monitor platelets by anticoagulation protocol: Yes   Plan:  Lovenox 80 mg sq Q 12 hours Coumadin 5 mg po daily INR Monday  Thank you Anette Guarneri, PharmD [phone]  12/02/2016,9:57 AM

## 2016-12-02 NOTE — Progress Notes (Signed)
Progress Note  Patient Name: Juan Li Date of Encounter: 12/02/2016  Primary Cardiologist: Dr. Saunders Revel (Dr. Aundra Dubin previously)  Subjective   No chest pain or dyspnea. Had dizzy spell early morning.  Inpatient Medications    Scheduled Meds: . aspirin EC  81 mg Oral q morning - 10a  . donepezil  5 mg Oral QPM  . enalapril  10 mg Oral BID  . finasteride  5 mg Oral Daily  . hydrochlorothiazide  12.5 mg Oral Daily  . memantine  5 mg Oral BID  . pravastatin  40 mg Oral QPM  . sodium chloride flush  3 mL Intravenous Q12H  . sodium chloride flush  3 mL Intravenous Q12H  . tamsulosin  0.4 mg Oral Daily  . Warfarin - Pharmacist Dosing Inpatient   Does not apply q1800   Continuous Infusions: . sodium chloride    . sodium chloride     PRN Meds: sodium chloride, sodium chloride, acetaminophen, hydrALAZINE, ondansetron (ZOFRAN) IV, sodium chloride flush, sodium chloride flush   Vital Signs    Vitals:   12/02/16 0400 12/02/16 0422 12/02/16 0600 12/02/16 0720  BP:   (!) 148/55 118/71  Pulse: 81  84 77  Resp:  17 19 20   Temp: 98.2 F (36.8 C)  98.2 F (36.8 C) 98.6 F (37 C)  TempSrc: Oral  Oral Oral  SpO2: 97%  98% 100%  Weight:      Height:        Intake/Output Summary (Last 24 hours) at 12/02/16 0830 Last data filed at 12/01/16 2245  Gross per 24 hour  Intake              345 ml  Output              100 ml  Net              245 ml   Filed Weights   12/01/16 0621 12/02/16 0105  Weight: 170 lb (77.1 kg) 166 lb 7.2 oz (75.5 kg)    Telemetry    Sinus rhythm at rate of 80s - Personally Reviewed  ECG    None today   Physical Exam   GEN: No acute distress.   Neck: No JVD Cardiac: RRR, no murmurs, rubs, or gallops. L radial cath site without hematoma.  Respiratory: Clear to auscultation bilaterally. GI: Soft, nontender, non-distended  MS: No edema; No deformity. Neuro:  Nonfocal  Psych: Normal affect   Labs    Chemistry Recent Labs Lab  12/02/16 0424  NA 136  K 4.0  CL 107  CO2 21*  GLUCOSE 117*  BUN 25*  CREATININE 1.48*  CALCIUM 9.1  GFRNONAA 42*  GFRAA 49*  ANIONGAP 8     Hematology Recent Labs Lab 12/02/16 0424  WBC 13.6*  RBC 4.24  HGB 13.8  HCT 39.4  MCV 92.9  MCH 32.5  MCHC 35.0  RDW 13.6  PLT 215     Radiology    No results found.  Cardiac Studies   ABDOMINAL AORTOGRAM  Lower Extremity Angiography  Conclusion   Conclusions: 1. Significant right common iliac (70%) and left popliteal (80-90%) stenoses. 2. Mild to moderate disease involving the left common iliac artery and ostial superficial femoral artery. 3. Tortuous right external iliac artery with small, non-flow-limiting dissection (likely catheter induced).  Recommendations: 1. Overnight observation to ensure no vascular compromise from iatrogenic right external iliac artery dissection. 2. If no bleeding or evidence of vascular compromise, we will  plan to restart enoxaparin tomorrow; warfarin can begin tonight per pharmacy, given history of mechanical aortic valve. 3. Obtain BMP in AM to ensure stable renal function. 4. Outpatient follow-up with me in ~2 weeks. 5. Consider supervised walking program after clinic follow-up. If pain persists, PTA to right common iliac and left popliteal arteries will be considered.     Patient Profile     83 y.o. male aortic stenosis status post mechanical aortic valve replacement (1993), heart failure with preserved ejection fraction, carotid artery stenosis, chronic kidney disease stage III, and dementia, who presented 12/01/16 for PV angiogram for leg pain bilaterally and poorly healing right calf wound.   Lower extremity vascular studies demonstrated normal ABIs but evidence of at least moderate bilateral inflow and outflow disease. Two-vessel runoff was also noted on the right.   Assessment & Plan    1. PAD with non healing R calf wound - Angiogram showed ight common iliac (70%) and left  popliteal (80-90%) stenoses. Mild to moderate disease involving the left common iliac artery and ostial superficial femoral artery. Medical management with walking program. Plan for PTA if pain persists.   2. Chronic diastolic CHF with hx of mechanical aortic valve replacement (1993) - Euvolemic. Continue coumadin. Resume Levonox at discharge given INR of 1.05. F/u in coumadin clinic as scheduled 8/8.   3. Carotid artery disease - Severe left and moderate right carotid artery disease. Seen by Dr. Scot Dock 06/2016 and recommended medical therapy.   4. HLD - 09/14/2016: Cholesterol, Total 194; HDL 42; LDL Calculated 121; Triglycerides 156  - Continue statin   5. CAD stage III - Scr stable at baseline of 1.4  Signed, Bhagat,Bhavinkumar, PA  12/02/2016, 8:30 AM    I have examined the patient and reviewed assessment and plan and discussed with patient.  Agree with above as stated.  Right leg circulation appears intact.  Okay for discharge today with follow-up with Dr. Saunders Revel. Left radial site intact.  Larae Grooms

## 2016-12-05 ENCOUNTER — Encounter (HOSPITAL_BASED_OUTPATIENT_CLINIC_OR_DEPARTMENT_OTHER): Payer: Medicare Other

## 2016-12-05 ENCOUNTER — Ambulatory Visit (INDEPENDENT_AMBULATORY_CARE_PROVIDER_SITE_OTHER): Payer: Medicare Other

## 2016-12-05 DIAGNOSIS — I6522 Occlusion and stenosis of left carotid artery: Secondary | ICD-10-CM

## 2016-12-05 DIAGNOSIS — Z952 Presence of prosthetic heart valve: Secondary | ICD-10-CM

## 2016-12-05 DIAGNOSIS — I359 Nonrheumatic aortic valve disorder, unspecified: Secondary | ICD-10-CM

## 2016-12-05 DIAGNOSIS — Z7901 Long term (current) use of anticoagulants: Secondary | ICD-10-CM | POA: Diagnosis not present

## 2016-12-05 LAB — POCT INR: INR: 1.1

## 2016-12-09 ENCOUNTER — Ambulatory Visit (INDEPENDENT_AMBULATORY_CARE_PROVIDER_SITE_OTHER): Payer: Medicare Other | Admitting: *Deleted

## 2016-12-09 DIAGNOSIS — I359 Nonrheumatic aortic valve disorder, unspecified: Secondary | ICD-10-CM | POA: Diagnosis not present

## 2016-12-09 DIAGNOSIS — I6522 Occlusion and stenosis of left carotid artery: Secondary | ICD-10-CM

## 2016-12-09 DIAGNOSIS — Z952 Presence of prosthetic heart valve: Secondary | ICD-10-CM | POA: Diagnosis not present

## 2016-12-09 DIAGNOSIS — Z7901 Long term (current) use of anticoagulants: Secondary | ICD-10-CM

## 2016-12-09 LAB — POCT INR: INR: 1.5

## 2016-12-12 DIAGNOSIS — D044 Carcinoma in situ of skin of scalp and neck: Secondary | ICD-10-CM | POA: Diagnosis not present

## 2016-12-12 DIAGNOSIS — C44212 Basal cell carcinoma of skin of right ear and external auricular canal: Secondary | ICD-10-CM | POA: Diagnosis not present

## 2016-12-12 DIAGNOSIS — L570 Actinic keratosis: Secondary | ICD-10-CM | POA: Diagnosis not present

## 2016-12-12 DIAGNOSIS — D485 Neoplasm of uncertain behavior of skin: Secondary | ICD-10-CM | POA: Diagnosis not present

## 2016-12-13 ENCOUNTER — Ambulatory Visit (INDEPENDENT_AMBULATORY_CARE_PROVIDER_SITE_OTHER): Payer: Medicare Other | Admitting: Pharmacist

## 2016-12-13 DIAGNOSIS — Z7901 Long term (current) use of anticoagulants: Secondary | ICD-10-CM

## 2016-12-13 DIAGNOSIS — Z952 Presence of prosthetic heart valve: Secondary | ICD-10-CM

## 2016-12-13 DIAGNOSIS — I6522 Occlusion and stenosis of left carotid artery: Secondary | ICD-10-CM

## 2016-12-13 DIAGNOSIS — I359 Nonrheumatic aortic valve disorder, unspecified: Secondary | ICD-10-CM | POA: Diagnosis not present

## 2016-12-13 LAB — POCT INR: INR: 2.9

## 2016-12-16 ENCOUNTER — Telehealth: Payer: Self-pay | Admitting: *Deleted

## 2016-12-16 ENCOUNTER — Encounter: Payer: Self-pay | Admitting: Internal Medicine

## 2016-12-16 ENCOUNTER — Ambulatory Visit (INDEPENDENT_AMBULATORY_CARE_PROVIDER_SITE_OTHER): Payer: Medicare Other | Admitting: Internal Medicine

## 2016-12-16 VITALS — BP 130/50 | HR 62 | Ht 66.0 in | Wt 182.6 lb

## 2016-12-16 DIAGNOSIS — Z952 Presence of prosthetic heart valve: Secondary | ICD-10-CM

## 2016-12-16 DIAGNOSIS — N183 Chronic kidney disease, stage 3 unspecified: Secondary | ICD-10-CM

## 2016-12-16 DIAGNOSIS — M109 Gout, unspecified: Secondary | ICD-10-CM | POA: Diagnosis not present

## 2016-12-16 DIAGNOSIS — I739 Peripheral vascular disease, unspecified: Secondary | ICD-10-CM

## 2016-12-16 DIAGNOSIS — I5032 Chronic diastolic (congestive) heart failure: Secondary | ICD-10-CM | POA: Diagnosis not present

## 2016-12-16 HISTORY — DX: Gout, unspecified: M10.9

## 2016-12-16 MED ORDER — COLCHICINE 0.6 MG PO TABS: ORAL_TABLET | ORAL | 0 refills | Status: DC

## 2016-12-16 NOTE — Progress Notes (Signed)
Follow-up Outpatient Visit Date: 12/16/2016  Primary Care Provider: Zenia Resides, MD [place] [address]  Chief Complaint: Left foot pain  HPI:  Juan Li is a 83 y.o. year-old male with history of aortic stenosis status post mechanical aortic valve replacement (1993), heart failure with preserved ejection fraction, carotid artery stenosis, chronic kidney disease stage III, and dementia, who presents for follow-up of PAD. I last saw him on 11/18/16, which time we agreed to undergo abdominal aortography and bilateral lower extremity runoff due to poorly healing right calf wound and abnormal vascular studies. This revealed 70% right common iliac and 80-90% left popliteal artery lesions, which were not intervened upon at this time due to chronic kidney disease. An iatrogenic nonflow limiting dissection also occurred in the right external iliac artery. Mr. Wigger was monitored overnight without any evidence of vascular compromise and was discharged home the next day.  Today, Mr. Eddinger reports that he has been doing relatively well except for pain involving the left great toe that began yesterday. It is sharp and most pronounced when bearing weight. Denies any wounds in this area. Right medial calf wound is unchanged. He recently saw a dermatologist but forgot to mention this to him. He does not have any calf or thigh pain with ambulation. He uses a rolling walker to get around. He has not had any falls. He is back on warfarin with a therapeutic INR. He completed his Lovenox bridge without incident. He denies chest pain, shortness of breath, palpitations, and lightheadedness.  --------------------------------------------------------------------------------------------------  Past Medical History:  Diagnosis Date  . Arthritis 02-15-12   osteoarthritis,right knee, pain right wrist.,  . Arthritis    "hands, legs" (12/01/2016)  . Carotid artery disease ([place])    Carotid US 1/18: R 40-59; L 80-99  . Chronic lower back pain   . Complication of anesthesia 02-15-12   very confused, and very slow to awaken after anesthesia  . Coronary artery disease 02-15-12   Heart valve replaced   . Hypercholesterolemia 02-15-12  . Hypertension   . PAD (peripheral artery disease) ([place])   . S/P AVR (aortic valve replacement)    Mechanical St. Jude AVR // Echo 12/17: Moderate LVH, EF 60-65, normal wall motion, grade 1 diastolic dysfunction, mechanical AVR okay with mean gradient 8 mmHg, MAC, mild mitral stenosis (mean gradient 2 mmHg), mild MR, mild RAE, PASP 25   Past Surgical History:  Procedure Laterality Date  . ABDOMINAL AORTAGRAM  12/01/2016  . ABDOMINAL AORTOGRAM N/A 12/01/2016   Procedure: ABDOMINAL AORTOGRAM;  Surgeon: Nelva Bush, MD;  Location: [facility];  Service: Cardiovascular;  Laterality: N/A;  . ANTERIOR CERVICAL DECOMP/DISCECTOMY FUSION       Decompression at the level of C3-C4 and C4-C5, allograft and plate from C3 to C%, microscope/notes 09/14/2010  . BACK SURGERY    . CARDIAC VALVE REPLACEMENT    . CARPAL TUNNEL RELEASE Left 01/2003   Archie Endo 09/14/2010  . JOINT REPLACEMENT    . LOWER EXTREMITY ANGIOGRAPHY  12/01/2016  . LOWER EXTREMITY ANGIOGRAPHY Bilateral 12/01/2016   Procedure: Lower Extremity Angiography;  Surgeon: Nelva Bush, MD;  Location: [facility];  Service: Cardiovascular;  Laterality: Bilateral;  . MECHANICAL AORTIC VALVE REPLACEMENT  1993   Archie Endo 11/18/2016  . SHOULDER OPEN ROTATOR CUFF REPAIR Left 02/15/2012  . TOTAL KNEE ARTHROPLASTY  02/20/2012   Procedure: TOTAL KNEE ARTHROPLASTY;  Surgeon: Gearlean Alf, MD;  Location: [facility];  Service: Orthopedics;  Laterality:  Right;  Marland Kitchen TRANSURETHRAL RESECTION OF PROSTATE  02/01/2010   Archie Endo 02/06/2010    Current Meds  Medication Sig  . aspirin EC 81 MG tablet Take 81 mg by mouth every morning.   . donepezil (ARICEPT) 5 MG tablet Take 1 tablet (5 mg total) by  mouth daily. (Patient taking differently: Take 5 mg by mouth every evening. )  . enalapril (VASOTEC) 10 MG tablet TAKE 1 TABLET (10 MG TOTAL) BY MOUTH 2 (TWO) TIMES DAILY.  Marland Kitchen enoxaparin (LOVENOX) 80 MG/0.8ML injection Inject 0.8 mLs (80 mg total) into the skin every 12 (twelve) hours.  . finasteride (PROSCAR) 5 MG tablet TAKE 1 TABLET (5 MG TOTAL) BY MOUTH DAILY.  . fluorouracil (EFUDEX) 5 % cream Apply topically daily. To scalp and ears.  Stop when sore.  . hydrochlorothiazide (HYDRODIURIL) 25 MG tablet Take 0.5 tablets (12.5 mg total) by mouth daily.  Marland Kitchen HYDROcodone-acetaminophen (NORCO) 10-325 MG tablet Take 1 tablet by mouth every 6 (six) hours as needed for moderate pain or severe pain.  . memantine (NAMENDA) 5 MG tablet Take 1 tablet (5 mg total) by mouth 2 (two) times daily.  . potassium chloride SA (K-DUR,KLOR-CON) 20 MEQ tablet Take 20 mEq by mouth every other day.  . pravastatin (PRAVACHOL) 40 MG tablet Take 1 tablet (40 mg total) by mouth every evening.  . tamsulosin (FLOMAX) 0.4 MG CAPS capsule Take 1 capsule (0.4 mg total) by mouth daily.  Marland Kitchen warfarin (JANTOVEN) 5 MG tablet Take 1 tablet (5 mg total) by mouth See admin instructions. Take 5 mg daily my mouth.Check INR 12/05/16.    Allergies: Patient has no known allergies.  Social History   Social History  . Marital status: Married    Spouse name: [name]  . Number of children: 3  . Years of education: N/A   Occupational History  . 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 Smoking status: Former Smoker    Packs/day: 1.00    Years: 50.00    Types: Cigarettes    Quit date: 08/10/1995  . Smokeless tobacco: Never Used  . Alcohol use 0.0 oz/week     Comment: 12/01/2016 "nothing in the 2000s"  . Drug use: No  . Sexual activity: Not on file   Other Topics Concern  . Not on file   Social History Narrative   Health Care POA: Wife, [place]   Emergency Contact: wife, [place],  [phone] (c)   Lindsy Cerullo of Life Plan:   Who lives with you: Patient lives with wife.   Any pets:    Diet: Patient has a varied diet of protein, starch, and vegetables.   Exercise: Patient does not have a regular exercise plan, afraid of falling.   Seatbelts: Patient reports wearing seatbelt when in vehicle.    Sun Exposure/Protection: Patient does not wear sun protection.   Hobbies: Patient likes to do yard work.          Family History  Problem Relation Age of Onset  . Heart disease Mother   . Hyperlipidemia Mother   . Hypertension Mother   . Varicose Veins Mother   . Hyperlipidemia Father   . Hypertension Father   . Heart attack Father     Review of Systems: Review of Systems  All other systems reviewed and are negative.  --------------------------------------------------------------------------------------------------  Physical Exam: BP (!) 130/50   Pulse 62   Ht _0  (1.676 m)   Wt 182 lb 9.6 oz (  82.8 kg)   SpO2 96%   BMI 29.47 kg/m   General:  Overweight man, seated comfortably in the exam room. HEENT: No conjunctival pallor or scleral icterus.  Moist mucous membranes.  OP clear. Neck: Supple without lymphadenopathy, thyromegaly, JVD, or HJR.  No carotid bruit. Lungs: Normal work of breathing.  Clear to auscultation bilaterally without wheezes or crackles. Heart: Regular rate and rhythm without murmurs, rubs, or gallops. Mechanical S2 noted. Non-displaced PMI. Abd: Bowel sounds present.  Soft, NT/ND without hepatosplenomegaly Ext: No lower extremity edema.  Right posterior tibial and dorsalis pedis pulses are 2+. Left posterior tibial dorsalis pedis pulses are trace. Bilateral radial pulses are one plus. Left radial arteriotomy site is well-healed.  Skin: Warm and dry. Irregular, slightly raised wound noted along the right medial calf with overlying eschar. Left great toe is warm and erythematous with tenderness over the IP joint. No open wound or discharge.  Lab  Results  Component Value Date   WBC 13.6 (H) 12/02/2016   HGB 13.8 12/02/2016   HCT 39.4 12/02/2016   MCV 92.9 12/02/2016   PLT 215 12/02/2016    Lab Results  Component Value Date   NA 136 12/02/2016   K 4.0 12/02/2016   CL 107 12/02/2016   CO2 21 (L) 12/02/2016   BUN 25 (H) 12/02/2016   CREATININE 1.48 (H) 12/02/2016   GLUCOSE 117 (H) 12/02/2016   ALT 11 09/14/2016    Lab Results  Component Value Date   CHOL 194 09/14/2016   HDL 42 09/14/2016   LDLCALC 121 (H) 09/14/2016   LDLDIRECT 134.0 02/03/2014   TRIG 156 (H) 09/14/2016   CHOLHDL 4.6 09/14/2016   --------------------------------------------------------------------------------------------------  ASSESSMENT AND PLAN: Peripheral arterial disease with nonhealing wound Overall, symptoms are relatively stable. Recent arterial studies showed 70% right common iliac artery and 80-90% left popliteal artery stenoses. Mr. persons symptoms are not classic for claudication. Additionally, I do not think that his right-sided disease is severe enough to explain his poorly healing wound. We have agreed to defer interventions at this time. We will continue with statin therapy and aspirin.  Gout I am concerned that left great toe pain and erythema represents a gout flare. Though Mr. Schanz does not carry a diagnosis of gout, he notes similar pain in the past that eventually resolved on its own. I have provided him with a prescription for colchicine 0.6 mg twice a day 1 day followed by 0.6 mg daily thereafter. If his pain has not improved over the next week, he should contact Dr. Andria Frames for further evaluation.  Chronic diastolic heart failure and history of aortic valve replacement No symptoms of heart failure. Mr. Alison Murray and appears euvolemic and well compensated. He is back on warfarin with therapeutic INR 3 days ago. No medication changes at this time.  Chronic kidney disease stage III In light of recent IV contrast exposure and  gout flare, I will obtain a basic metabolic panel today.  Follow-up: Return to clinic in 3 months.  Nelva Bush, MD 12/16/2016 12:26 PM

## 2016-12-16 NOTE — Patient Instructions (Addendum)
Medication Instructions:  Start colchicine 0.6 mg  two times a day for one  day, then take 1 tablet  daily for one  week.  If your toe pain is not better in one  week, you should contact your Primary Care Doctor.  Labwork: Your physician recommends that you have lab today-BMET  Testing/Procedures: None   Follow-Up: Your physician recommends that you schedule a follow-up appointment in: 3 months with Dr End.        If you need a refill on your cardiac medications before your next appointment, please call your pharmacy.

## 2016-12-16 NOTE — Telephone Encounter (Signed)
Pt is to be on Colchicine 0.6mg  bid today then 1 daily for gout Pt instructed if he has diarrhea to call coumadin clinic and let us know as diarrhea will at times elevate INR and he states understanding

## 2016-12-17 LAB — BASIC METABOLIC PANEL
BUN / CREAT RATIO: 17 (ref 10–24)
BUN: 27 mg/dL (ref 8–27)
CALCIUM: 9.6 mg/dL (ref 8.6–10.2)
CO2: 22 mmol/L (ref 20–29)
Chloride: 102 mmol/L (ref 96–106)
Creatinine, Ser: 1.57 mg/dL — ABNORMAL HIGH (ref 0.76–1.27)
GFR, EST AFRICAN AMERICAN: 46 mL/min/{1.73_m2} — AB (ref >59)
GFR, EST NON AFRICAN AMERICAN: 40 mL/min/{1.73_m2} — AB (ref >59)
Glucose: 86 mg/dL (ref 65–99)
POTASSIUM: 4.7 mmol/L (ref 3.5–5.2)
SODIUM: 139 mmol/L (ref 134–144)

## 2016-12-22 ENCOUNTER — Encounter: Payer: Self-pay | Admitting: Family Medicine

## 2016-12-28 ENCOUNTER — Ambulatory Visit (INDEPENDENT_AMBULATORY_CARE_PROVIDER_SITE_OTHER): Payer: Medicare Other | Admitting: *Deleted

## 2016-12-28 DIAGNOSIS — Z7901 Long term (current) use of anticoagulants: Secondary | ICD-10-CM | POA: Diagnosis not present

## 2016-12-28 DIAGNOSIS — I6522 Occlusion and stenosis of left carotid artery: Secondary | ICD-10-CM

## 2016-12-28 DIAGNOSIS — I359 Nonrheumatic aortic valve disorder, unspecified: Secondary | ICD-10-CM

## 2016-12-28 DIAGNOSIS — Z952 Presence of prosthetic heart valve: Secondary | ICD-10-CM

## 2016-12-28 LAB — POCT INR: INR: 2.5

## 2017-01-02 ENCOUNTER — Other Ambulatory Visit: Payer: Self-pay | Admitting: Physician Assistant

## 2017-01-04 ENCOUNTER — Other Ambulatory Visit: Payer: Self-pay | Admitting: Physician Assistant

## 2017-01-11 ENCOUNTER — Encounter: Payer: Self-pay | Admitting: Family Medicine

## 2017-01-11 ENCOUNTER — Ambulatory Visit (INDEPENDENT_AMBULATORY_CARE_PROVIDER_SITE_OTHER): Payer: Medicare Other | Admitting: Family Medicine

## 2017-01-11 DIAGNOSIS — I6522 Occlusion and stenosis of left carotid artery: Secondary | ICD-10-CM | POA: Diagnosis not present

## 2017-01-11 DIAGNOSIS — M4712 Other spondylosis with myelopathy, cervical region: Secondary | ICD-10-CM | POA: Diagnosis not present

## 2017-01-11 DIAGNOSIS — I10 Essential (primary) hypertension: Secondary | ICD-10-CM | POA: Diagnosis not present

## 2017-01-11 DIAGNOSIS — M542 Cervicalgia: Secondary | ICD-10-CM

## 2017-01-11 DIAGNOSIS — M159 Polyosteoarthritis, unspecified: Secondary | ICD-10-CM

## 2017-01-11 DIAGNOSIS — R269 Unspecified abnormalities of gait and mobility: Secondary | ICD-10-CM | POA: Diagnosis not present

## 2017-01-11 DIAGNOSIS — Z23 Encounter for immunization: Secondary | ICD-10-CM | POA: Diagnosis present

## 2017-01-11 MED ORDER — HYDROCODONE-ACETAMINOPHEN 10-325 MG PO TABS: 1.0000 | ORAL_TABLET | Freq: Four times a day (QID) | ORAL | 0 refills | Status: DC | PRN

## 2017-01-11 NOTE — Assessment & Plan Note (Addendum)
Reviewed MRI on 07/2014.  Progressive changes of worsening recurrent spinal stenosis.  Will repeat MRI to make sure not important pressure on spinal cord.

## 2017-01-11 NOTE — Patient Instructions (Signed)
I will call with the MRI report. Stop the hydrochlorothiazide to see if that helps with the dizziness. Plan to see me in three months.   Let me know if your blood pressure starts creeping up.  I want the top number to stay less than 160.

## 2017-01-12 NOTE — Assessment & Plan Note (Signed)
Refill pain meds 

## 2017-01-12 NOTE — Progress Notes (Signed)
   Subjective:    Patient ID: Juan Li, male    DOB: Apr 08, 1932, 83 y.o.   MRN: [id number]  HPI Multiple issues 1. Chronic pain.  Managed OK by chronic narcotics.  No concerning symptoms.  Given ongoing problems, I am reluctant to wean. 2. Chronic gait instability largely due to cervical spondylosis with myelopathy.  Operated on years ago.  Unclear to me if getting worse or stable.  Reviewed MRI from 07/02/14 which had mild to mod spinal stenosis above and below fusion site.  No bowel or bladder incontinence. 3. Memory issues stable.     Review of Systems     Objective:   Physical ExamLungs clear Cardiac RRR without m or g Gait shuffling Cannot well coordinate cane usage due to cognitive issues.   BP is lowish        Assessment & Plan:

## 2017-01-12 NOTE — Assessment & Plan Note (Signed)
Will recheck spine MRI.

## 2017-01-12 NOTE — Assessment & Plan Note (Signed)
Lowish bp today and a bit dizzy.  Given gout, will try off HCTZ, monitor BP and see if that improves either gout or dizziness.

## 2017-01-18 ENCOUNTER — Ambulatory Visit (INDEPENDENT_AMBULATORY_CARE_PROVIDER_SITE_OTHER): Payer: Medicare Other | Admitting: *Deleted

## 2017-01-18 DIAGNOSIS — I359 Nonrheumatic aortic valve disorder, unspecified: Secondary | ICD-10-CM

## 2017-01-18 DIAGNOSIS — Z7901 Long term (current) use of anticoagulants: Secondary | ICD-10-CM | POA: Diagnosis not present

## 2017-01-18 DIAGNOSIS — Z952 Presence of prosthetic heart valve: Secondary | ICD-10-CM

## 2017-01-18 LAB — POCT INR: INR: 3

## 2017-01-30 ENCOUNTER — Telehealth: Payer: Self-pay | Admitting: Family Medicine

## 2017-01-30 DIAGNOSIS — N183 Chronic kidney disease, stage 3 unspecified: Secondary | ICD-10-CM

## 2017-01-30 MED ORDER — COLCHICINE 0.6 MG PO TABS: ORAL_TABLET | ORAL | 3 refills | Status: DC

## 2017-01-30 NOTE — Telephone Encounter (Signed)
Needs refill on his gout medicine.  [facility] [address]. He would like to be called when the Rx is called in. He is willing to come pick it up if necessary.

## 2017-02-15 ENCOUNTER — Ambulatory Visit (INDEPENDENT_AMBULATORY_CARE_PROVIDER_SITE_OTHER): Payer: Medicare Other | Admitting: *Deleted

## 2017-02-15 DIAGNOSIS — Z952 Presence of prosthetic heart valve: Secondary | ICD-10-CM | POA: Diagnosis not present

## 2017-02-15 DIAGNOSIS — Z7901 Long term (current) use of anticoagulants: Secondary | ICD-10-CM

## 2017-02-15 DIAGNOSIS — Z5181 Encounter for therapeutic drug level monitoring: Secondary | ICD-10-CM | POA: Diagnosis not present

## 2017-02-15 DIAGNOSIS — I359 Nonrheumatic aortic valve disorder, unspecified: Secondary | ICD-10-CM

## 2017-02-15 LAB — POCT INR: INR: 2.6

## 2017-02-16 ENCOUNTER — Other Ambulatory Visit: Payer: Self-pay | Admitting: Family Medicine

## 2017-02-16 ENCOUNTER — Ambulatory Visit
Admission: RE | Admit: 2017-02-16 | Discharge: 2017-02-16 | Disposition: A | Discharge status: Home / Self Care | Payer: Medicare Other | Source: Ambulatory Visit | Attending: Family Medicine | Admitting: Family Medicine

## 2017-02-16 DIAGNOSIS — M4802 Spinal stenosis, cervical region: Secondary | ICD-10-CM | POA: Diagnosis not present

## 2017-02-16 DIAGNOSIS — M4712 Other spondylosis with myelopathy, cervical region: Secondary | ICD-10-CM

## 2017-02-16 DIAGNOSIS — M542 Cervicalgia: Secondary | ICD-10-CM

## 2017-02-16 NOTE — Progress Notes (Signed)
Pt informed that the referral was placed. Fleeger, Salome Spotted, CMA

## 2017-02-16 NOTE — Progress Notes (Signed)
Called and LM that I want him to see the neurosurgeon again.

## 2017-02-17 ENCOUNTER — Telehealth: Payer: Self-pay | Admitting: *Deleted

## 2017-02-17 NOTE — Telephone Encounter (Signed)
Patient's wife called in requesting return call from PCP regarding MRI results and plans going forward. Hubbard Hartshorn, RN, BSN

## 2017-02-17 NOTE — Telephone Encounter (Signed)
Called and discussed.  Will continue with the NS referral.  Wife on board.

## 2017-02-22 ENCOUNTER — Encounter: Payer: Self-pay | Admitting: Family Medicine

## 2017-02-22 DIAGNOSIS — C44219 Basal cell carcinoma of skin of left ear and external auricular canal: Secondary | ICD-10-CM | POA: Diagnosis not present

## 2017-02-22 DIAGNOSIS — D044 Carcinoma in situ of skin of scalp and neck: Secondary | ICD-10-CM | POA: Diagnosis not present

## 2017-02-22 DIAGNOSIS — C44212 Basal cell carcinoma of skin of right ear and external auricular canal: Secondary | ICD-10-CM | POA: Diagnosis not present

## 2017-02-22 DIAGNOSIS — D485 Neoplasm of uncertain behavior of skin: Secondary | ICD-10-CM | POA: Diagnosis not present

## 2017-03-03 ENCOUNTER — Telehealth: Payer: Self-pay | Admitting: *Deleted

## 2017-03-03 NOTE — Telephone Encounter (Signed)
Patient's wife left message on nurse line wanting to let PCP know that they have not heard from a surgeon regarding MRI. Hubbard Hartshorn, RN, BSN

## 2017-03-07 ENCOUNTER — Other Ambulatory Visit: Payer: Self-pay | Admitting: Family Medicine

## 2017-03-07 NOTE — Telephone Encounter (Signed)
Rollene Fare returns call. I was able to get patient scheduled. (She stated she attempted to call patient to schedule this afternoon with no answer on either phone.)  Patient is schedule to see Dr. Cyndy Freeze on Wed. 03/15/17 at 10:30 AM (arrive at 10 am).   [facility] Address: [address], [place], [place] [address] Phone: [phone]  I also attempted to contact patient to advise of this appt, no answer. lmovm to have him call office to receive appt info. Please have patient contact their office directly if this appt does not work for him.

## 2017-03-07 NOTE — Telephone Encounter (Signed)
Will ask referral coordinator about status of neurosurgical referral.

## 2017-03-07 NOTE — Telephone Encounter (Signed)
I spoke with referral coordinator Rollene Fare) at [facility] about another patient yesterday, and she advised me that they are currently behind on getting referrals scheduled. Currently scheduling referrals that were sent to them in the first week on October. Patient's referral was sent to them on 10/19. I have left Regina at [facility] a VM to see how much longer it will be be for patient.

## 2017-03-08 DIAGNOSIS — D044 Carcinoma in situ of skin of scalp and neck: Secondary | ICD-10-CM | POA: Diagnosis not present

## 2017-03-09 NOTE — Telephone Encounter (Signed)
Called wife and verified that she has information about NS referral

## 2017-03-15 DIAGNOSIS — M542 Cervicalgia: Secondary | ICD-10-CM | POA: Diagnosis not present

## 2017-03-15 DIAGNOSIS — M4712 Other spondylosis with myelopathy, cervical region: Secondary | ICD-10-CM | POA: Diagnosis not present

## 2017-03-16 ENCOUNTER — Telehealth: Payer: Self-pay

## 2017-03-16 NOTE — Telephone Encounter (Signed)
   [facility] Pre-operative Risk Assessment    Request for surgical clearance:  1. What type of surgery is being performed? c5-6 anterior discectomy with fusion and plate fixation: removal of c3-c5 anterior cervical plate  2. When is this surgery scheduled? TBD   3. Are there any medications that need to be held prior to surgery and how long? patient is on coumadin and will need tos top prior to surgery/ please advise if this is ok and how long to stop meds.   4. Practice name and name of physician performing surgery? [facility]. Dr Marland Kitchen Ditty.  5. What is your office phone and fax number? Phone: [phone]. Fax: [phone].   6. Anesthesia type (None, local, MAC, general) ? Not specified.   Stephannie Peters 03/16/2017, 12:40 PM  _________________________________________________________________   (provider comments below)

## 2017-03-16 NOTE — Telephone Encounter (Signed)
   Chart reviewed as part of pre-op. Last seen by Dr. Saunders Revel, 12/16/2016. LMOM to CB. Will route to pharmacy for review.

## 2017-03-16 NOTE — Telephone Encounter (Signed)
    Chart reviewed as part of pre-operative protocol coverage. Because of Juan Li's past medical history and time since last visit, he/she will require a follow-up visit in order to better assess preoperative cardiovascular risk. Their primary cardiologist is Dr. Saunders Revel.  Patient already has an appointment with Dr. Saunders Revel on 03/20/2017 at 9:20 AM. I spoke with his wife on the phone today who indicates the patient is doing well from a cardiac standpoint. He has been without chest pain, SOB, dizziness, presyncope, or syncope. He does not yet know a potential date for surgery. He has left over Lovenox from prior surgery at home that his wife wants to count and see if they could be used for bridging for this procedure. I advised her to bring them to the patient's up coming appointment for review. Pharmacy has weighed in on bridging with Lovenox which will be coordinated with Coumadin Clinic. We will await review from Dr. Saunders Revel at the patient's appointment on 11/19 and for a surgical date to be set prior to providing a date to start his bridge with Lovenox.   Pre-op covering staff: - Please schedule appointment and call patient to inform them. - Please contact requesting surgeon's office via preferred method (i.e, phone, fax) to inform them of need for appointment prior to surgery.  Christell Faith, PA-C  03/16/2017, 3:11 PM

## 2017-03-16 NOTE — Telephone Encounter (Signed)
Juan Li is returning your call. Thanks

## 2017-03-16 NOTE — Telephone Encounter (Signed)
Pt will need to hold warfarin for 5 days prior to procedure. He is historically bridged with Lovenox prior to procedures due to history of mechanical AVR. Will coordinate Lovenox bridge in Coumadin clinic for upcoming procedure. Pt scheduled 11/19 for next INR check and can coordinate then.

## 2017-03-20 ENCOUNTER — Ambulatory Visit (INDEPENDENT_AMBULATORY_CARE_PROVIDER_SITE_OTHER): Payer: Medicare Other | Admitting: Internal Medicine

## 2017-03-20 ENCOUNTER — Encounter: Payer: Self-pay | Admitting: Internal Medicine

## 2017-03-20 ENCOUNTER — Ambulatory Visit (INDEPENDENT_AMBULATORY_CARE_PROVIDER_SITE_OTHER): Payer: Medicare Other | Admitting: *Deleted

## 2017-03-20 VITALS — BP 180/60 | HR 68 | Ht 66.0 in | Wt 185.4 lb

## 2017-03-20 DIAGNOSIS — Z7901 Long term (current) use of anticoagulants: Secondary | ICD-10-CM

## 2017-03-20 DIAGNOSIS — R0609 Other forms of dyspnea: Secondary | ICD-10-CM

## 2017-03-20 DIAGNOSIS — Z952 Presence of prosthetic heart valve: Secondary | ICD-10-CM

## 2017-03-20 DIAGNOSIS — Z5181 Encounter for therapeutic drug level monitoring: Secondary | ICD-10-CM | POA: Diagnosis not present

## 2017-03-20 DIAGNOSIS — I10 Essential (primary) hypertension: Secondary | ICD-10-CM | POA: Diagnosis not present

## 2017-03-20 DIAGNOSIS — R0602 Shortness of breath: Secondary | ICD-10-CM | POA: Diagnosis not present

## 2017-03-20 DIAGNOSIS — I359 Nonrheumatic aortic valve disorder, unspecified: Secondary | ICD-10-CM

## 2017-03-20 DIAGNOSIS — I739 Peripheral vascular disease, unspecified: Secondary | ICD-10-CM

## 2017-03-20 DIAGNOSIS — Z01818 Encounter for other preprocedural examination: Secondary | ICD-10-CM

## 2017-03-20 DIAGNOSIS — N183 Chronic kidney disease, stage 3 (moderate): Secondary | ICD-10-CM | POA: Diagnosis not present

## 2017-03-20 LAB — POCT INR: INR: 2.8

## 2017-03-20 LAB — BASIC METABOLIC PANEL
BUN/Creatinine Ratio: 13 (ref 10–24)
BUN: 18 mg/dL (ref 8–27)
CALCIUM: 9.1 mg/dL (ref 8.6–10.2)
CHLORIDE: 106 mmol/L (ref 96–106)
CO2: 21 mmol/L (ref 20–29)
CREATININE: 1.4 mg/dL — AB (ref 0.76–1.27)
GFR calc non Af Amer: 46 mL/min/{1.73_m2} — ABNORMAL LOW (ref >59)
GFR, EST AFRICAN AMERICAN: 53 mL/min/{1.73_m2} — AB (ref >59)
Glucose: 88 mg/dL (ref 65–99)
Potassium: 4.6 mmol/L (ref 3.5–5.2)
Sodium: 142 mmol/L (ref 134–144)

## 2017-03-20 NOTE — Patient Instructions (Signed)
Continue same dosage 1 tablet daily except 1/2 tablet on Sundays and Thursdays. Recheck in 4 weeks.  Call when scheduled for procedure Coumadin Clinic [phone] with any questions or concerns

## 2017-03-20 NOTE — Patient Instructions (Addendum)
Medication Instructions:  Your physician recommends that you continue on your current medications as directed. Please refer to the Current Medication list given to you today.  Please call me and let me know the dose of enalapril (Vasotec)  and the number of times you take it per day. Webb Silversmith [phone].  Labwork: BMET today   Testing/Procedures: Your physician has requested that you have a lexiscan myoview. For further information please visit [email or web address]. Please follow instruction sheet, as given.    Follow-Up: Your physician recommends that you schedule a follow-up appointment in: 3 months with Dr End.        If you need a refill on your cardiac medications before your next appointment, please call your pharmacy.

## 2017-03-20 NOTE — Telephone Encounter (Signed)
Pt schedule for stress test on 11/27, clearance will be base on result.

## 2017-03-20 NOTE — Progress Notes (Signed)
Follow-up Outpatient Visit Date: 03/20/2017  Primary Care Provider: Zenia Resides, MD [place] [address]  Chief Complaint: Poor balance and shortness of breath  HPI:  Mr. Kroeker is a 83 y.o. year-old male with history of aortic stenosis status post mechanical aortic valve replacement (1993), heart failure with preserved ejection fraction, carotid artery stenosis, peripheral artery disease, chronic kidney disease stage III, and dementia who presents for follow-up of peripheral vascular disease and heart failure with preserved ejection fraction. I last saw Mr Holsomback in August following abdominal aortogram and lower extremity angiography in the setting of poorly healing right calf wound. Angiogram demonstrated 70% right common iliac stenosis and 80-90% left popliteal artery stenosis. Intervention was deferred due chronic kidney disease. At our follow-up visit, his right calf wound was unchanged, though Mr. Geurts did not have any claudication. In light of his comorbidities, we agreed to continue with medical therapy. Of note, Mr. Bristol is scheduled for cervical spine surgery by Dr. Cyndy Freeze.  Today, Mr. Leverich states that he feels about the same as at our last visit. He has chronic exertional dyspnea; he is able to walk about 20 yards before needing to stop. He denies chest pain, palpitations, lightheadedness, and edema. His balance remains poor, and he is concerned that this may be due to cervical spine disease. Other than poor balance, he has not had any focal neurologic changes, included weakness, slurred speech, and amaurosis fugax. Mr. Ehresman remains compliant with warfarin. He has not had any significant bleeding. He does not check his blood pressure regularly at home, but notes that it is frequently elevated when he sees doctors.  Mr. Mclaren notes that his right calf wound has not resolved but is slowly improving. He does not have any new wounds. He  denies leg pain with activity and at rest. --------------------------------------------------------------------------------------------------  Past Medical History:  Diagnosis Date  . Arthritis 02-15-12   osteoarthritis,right knee, pain right wrist.,  . Arthritis    "hands, legs" (12/01/2016)  . Carotid artery disease ([place])    Carotid US 1/18: R 40-59; L 80-99  . Chronic lower back pain   . Complication of anesthesia 02-15-12   very confused, and very slow to awaken after anesthesia  . Coronary artery disease 02-15-12   Heart valve replaced   . Hypercholesterolemia 02-15-12  . Hypertension   . PAD (peripheral artery disease) ([place])   . S/P AVR (aortic valve replacement)    Mechanical [employer] AVR // Echo 12/17: Moderate LVH, EF 60-65, normal wall motion, grade 1 diastolic dysfunction, mechanical AVR okay with mean gradient 8 mmHg, MAC, mild mitral stenosis (mean gradient 2 mmHg), mild MR, mild RAE, PASP 25   Past Surgical History:  Procedure Laterality Date  . ABDOMINAL AORTAGRAM  12/01/2016  . ABDOMINAL AORTOGRAM N/A 12/01/2016   Performed by Nelva Bush, MD at [facility]  . ANTERIOR CERVICAL DECOMP/DISCECTOMY FUSION       Decompression at the level of C3-C4 and C4-C5, allograft and plate from C3 to C%, microscope/notes 09/14/2010  . BACK SURGERY    . CARDIAC VALVE REPLACEMENT    . CARPAL TUNNEL RELEASE Left 01/2003   Archie Endo 09/14/2010  . JOINT REPLACEMENT    . LOWER EXTREMITY ANGIOGRAPHY  12/01/2016  . Lower Extremity Angiography Bilateral 12/01/2016   Performed by Nelva Bush, MD at [facility]  . MECHANICAL AORTIC VALVE REPLACEMENT  1993   Archie Endo 11/18/2016  . SHOULDER OPEN ROTATOR CUFF REPAIR  Left 02/15/2012  . TOTAL KNEE ARTHROPLASTY Right 02/20/2012   Performed by Gearlean Alf, MD at [facility]  . TRANSURETHRAL RESECTION OF PROSTATE  02/01/2010   Archie Endo 02/06/2010    Recent CV Pertinent Labs: Lab Results  Component Value Date   CHOL 194 09/14/2016    HDL 42 09/14/2016   LDLCALC 121 (H) 09/14/2016   LDLDIRECT 134.0 02/03/2014   TRIG 156 (H) 09/14/2016   CHOLHDL 4.6 09/14/2016   CHOLHDL 4.7 06/10/2015   INR 2.8 03/20/2017   INR 1.05 12/02/2016   INR 2.8 07/16/2010   K 4.6 03/20/2017   BUN 18 03/20/2017   CREATININE 1.40 (H) 03/20/2017   CREATININE 1.63 (H) 03/29/2016    Past medical and surgical history were reviewed and updated in EPIC.  Current Meds  Medication Sig  . aspirin EC 81 MG tablet Take 81 mg by mouth every morning.   . colchicine 0.6 MG tablet Take 1 tablet by mouth two times a day for one day, then 1 tablet daily for one week.  . donepezil (ARICEPT) 5 MG tablet Take 1 tablet (5 mg total) by mouth daily. (Patient taking differently: Take 5 mg by mouth every evening. )  . enalapril (VASOTEC) 10 MG tablet TAKE 1 TABLET (10 MG TOTAL) BY MOUTH 2 (TWO) TIMES DAILY.  . finasteride (PROSCAR) 5 MG tablet TAKE 1 TABLET (5 MG TOTAL) BY MOUTH DAILY.  . fluorouracil (EFUDEX) 5 % cream Apply topically daily. To scalp and ears.  Stop when sore.  . hydrochlorothiazide (HYDRODIURIL) 25 MG tablet Take 25 mg daily by mouth.  Marland Kitchen HYDROcodone-acetaminophen (NORCO) 10-325 MG tablet Take 1 tablet by mouth every 6 (six) hours as needed for moderate pain or severe pain.  . memantine (NAMENDA) 5 MG tablet Take 1 tablet (5 mg total) by mouth 2 (two) times daily.  . potassium chloride SA (K-DUR,KLOR-CON) 20 MEQ tablet Take 20 mEq by mouth every other day.  . pravastatin (PRAVACHOL) 40 MG tablet Take 1 tablet (40 mg total) by mouth every evening.  . tamsulosin (FLOMAX) 0.4 MG CAPS capsule TAKE ONE CAPSULE BY MOUTH EVERY DAY  . warfarin (COUMADIN) 5 MG tablet TAKE 1 TABLET AS DIRECTED    Allergies: Patient has no known allergies.  Social History   Socioeconomic History  . Marital status: Married    Spouse name: [name]  . Number of children: 3  . Years of education: Not on file  . Highest education level: Not on file  Social Needs  .  Financial resource strain: Not on file  . Food insecurity - worry: Not on file  . Food insecurity - inability: Not on file  . Transportation needs - medical: Not on file  . Transportation needs - non-medical: Not on file  Occupational History  . 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 Smoking status: Former Smoker    Packs/day: 1.00    Years: 50.00    Pack years: 50.00    Types: Cigarettes    Last attempt to quit: 08/10/1995    Years since quitting: 21.6  . Smokeless tobacco: Never Used  Substance and Sexual Activity  . Alcohol use: Yes    Alcohol/week: 0.0 oz    Comment: 12/01/2016 "nothing in the 2000s"  . Drug use: No  . Sexual activity: Not on file  Other Topics Concern  . Not on file  Social History Narrative   Health Care POA: Wife, [name] Spradlin  Emergency Contact: wife, [place], [phone] (c)   Peggi Yono of Life Plan:   Who lives with you: Patient lives with wife.   Any pets:    Diet: Patient has a varied diet of protein, starch, and vegetables.   Exercise: Patient does not have a regular exercise plan, afraid of falling.   Seatbelts: Patient reports wearing seatbelt when in vehicle.    Sun Exposure/Protection: Patient does not wear sun protection.   Hobbies: Patient likes to do yard work.          Family History  Problem Relation Age of Onset  . Heart disease Mother   . Hyperlipidemia Mother   . Hypertension Mother   . Varicose Veins Mother   . Hyperlipidemia Father   . Hypertension Father   . Heart attack Father     Review of Systems: Right calf wound slowly improving (but not resolved). He is scheduled to see a dermatologist later this week. Otherwise, a 12-system review of systems was performed and was negative except as noted in the HPI.  --------------------------------------------------------------------------------------------------  Physical Exam: BP (!) 180/60   Pulse 68   Ht _0  (1.676 m)    Wt 185 lb 6.4 oz (84.1 kg)   SpO2 97%   BMI 29.92 kg/m   General:  Overweight, elderly man, seated comfortably in the exam room. He is accompanied by his wife. HEENT: No conjunctival pallor or scleral icterus. Moist mucous membranes.  OP clear. Neck: Supple without lymphadenopathy, thyromegaly, JVD, or HJR. Lungs: Normal work of breathing. Clear to auscultation bilaterally without wheezes or crackles. Heart: Regular rate and rhythm without murmurs, rubs, or gallops. Non-displaced PMI. Abd: Bowel sounds present. Soft, NT/ND without hepatosplenomegaly Ext: No lower extremity edema. 2+ radial, 1+pedal pulses bilaterally. Skin: Warm and dry without rash.  EKG:  Normal sinus rhythm with non-specific ST segment changes. No significant change from prior tracing.  Lab Results  Component Value Date   WBC 13.6 (H) 12/02/2016   HGB 13.8 12/02/2016   HCT 39.4 12/02/2016   MCV 92.9 12/02/2016   PLT 215 12/02/2016    Lab Results  Component Value Date   NA 142 03/20/2017   K 4.6 03/20/2017   CL 106 03/20/2017   CO2 21 03/20/2017   BUN 18 03/20/2017   CREATININE 1.40 (H) 03/20/2017   GLUCOSE 88 03/20/2017   ALT 11 09/14/2016    Lab Results  Component Value Date   CHOL 194 09/14/2016   HDL 42 09/14/2016   LDLCALC 121 (H) 09/14/2016   LDLDIRECT 134.0 02/03/2014   TRIG 156 (H) 09/14/2016   CHOLHDL 4.6 09/14/2016    --------------------------------------------------------------------------------------------------  ASSESSMENT AND PLAN: Dyspnea on exertion Longstanding and  Likely multifactorial. Echo in 04/2016 showed appropriate aortic valve prosthesis function and mildly elevated transmitral gradient. Last ischemic evaluation was myocardial perfusion stress test in 2015 (low risk). Given poor function capacity, we have agreed to obtain a pharmacologic myocardial perfusion stress test. Blood pressure is poorly controlled today, which may be contributing to his  dyspnea.  Hypertension Blood pressure poorly controlled today. Mr. Vanbeek and his wife are somewhat unsure of if he is taking enalapril daily or BID. The will check at home today and let us know. I will obtain a BMP today in anticipation of doubling his current dose of enalapril. He will need to return in ~2 weeks for a BMP and BP check.  Preoperative risk assessment Poor function status limits evaluation. Given Mr. Saldivar chronic dyspnea on exertion and  history of peripheral vascular disease, we have agreed to obtain a pharmacologic myocardial perfusion stress test. If there is no evidence of high-risk ischemia, I think it would be safe to proceed with c-spine surgery without additional testing. Given history of significant cerebrovascular disease, it would be worthwhile to consult with Dr. Scot Dock (vascular surgery) about the risk for stroke or other cerebrovascular complication.  Mechanical aortic valve No clinical evidence of valve dysfunction. Mr. Yero is tolerating warfarin well. In the past, he has been bridged with enoxaparin for elective surgical procedures. I recommend bridging again with LMWH if/when he proceeds with c-spine surgery.  Peripheral vascular disease No claudication reported by the patient. Right calf wound is slowly healing. We will continue to defer intervention on his right common iliac and left SFA disease, as he is not symptomatic (I do not believe that his right calf wound represents an ischemic ulcer). Additionally, intervention would necessitate antiplatelet therapy that would delay spine surgery.  Follow-up: Return to clinic in 3 months.  Nelva Bush, MD 03/20/2017 6:08 PM

## 2017-03-21 ENCOUNTER — Telehealth (HOSPITAL_COMMUNITY): Payer: Self-pay | Admitting: Radiology

## 2017-03-21 ENCOUNTER — Telehealth (HOSPITAL_COMMUNITY): Payer: Self-pay | Admitting: *Deleted

## 2017-03-21 NOTE — Telephone Encounter (Signed)
Patient given detailed instructions per Myocardial Perfusion Study Information Sheet for the test on 03/28/2016 at 9:45. Patient notified to arrive 15 minutes early and that it is imperative to arrive on time for appointment to keep from having the test rescheduled.  If you need to cancel or reschedule your appointment, please call the office within 24 hours of your appointment. . Patient verbalized understanding.EHK

## 2017-03-21 NOTE — Telephone Encounter (Signed)
Left message on voicemail in reference to upcoming appointment scheduled for 03/28/17. Phone number given for a call back so details instructions can be given. Maudene Stotler, Ranae Palms

## 2017-03-22 ENCOUNTER — Telehealth: Payer: Self-pay | Admitting: *Deleted

## 2017-03-22 DIAGNOSIS — I10 Essential (primary) hypertension: Secondary | ICD-10-CM

## 2017-03-22 DIAGNOSIS — I5032 Chronic diastolic (congestive) heart failure: Secondary | ICD-10-CM

## 2017-03-22 DIAGNOSIS — S0100XD Unspecified open wound of scalp, subsequent encounter: Secondary | ICD-10-CM | POA: Diagnosis not present

## 2017-03-22 DIAGNOSIS — N183 Chronic kidney disease, stage 3 unspecified: Secondary | ICD-10-CM

## 2017-03-22 NOTE — Addendum Note (Signed)
Addended by: Katrine Coho on: 03/22/2017 11:40 AM   Modules accepted: Orders

## 2017-03-22 NOTE — Telephone Encounter (Signed)
Notes recorded by Katrine Coho, RN on 03/22/2017 at 11:08 AM EST Discussed with patient's wife, she verbalized understanding, will plan repeat BMET /BP check at time of myoview already scheduled for 03/28/17 ------  Notes recorded by End, Harrell Gave, MD on 03/21/2017 at 8:42 PM EST Please let Juan Li know that his kidney function and potassium are stable. I suggest that he increase enalapril to 20 mg twice a day and come back in 1 to 2 weeks for a repeat BMP and blood pressure check.

## 2017-03-28 ENCOUNTER — Other Ambulatory Visit: Payer: Medicare Other

## 2017-03-28 ENCOUNTER — Ambulatory Visit (HOSPITAL_COMMUNITY): Payer: Medicare Other | Attending: Cardiology

## 2017-03-28 DIAGNOSIS — R0609 Other forms of dyspnea: Secondary | ICD-10-CM

## 2017-03-28 DIAGNOSIS — Z01818 Encounter for other preprocedural examination: Secondary | ICD-10-CM | POA: Diagnosis not present

## 2017-03-28 DIAGNOSIS — R269 Unspecified abnormalities of gait and mobility: Secondary | ICD-10-CM | POA: Diagnosis not present

## 2017-03-28 DIAGNOSIS — I10 Essential (primary) hypertension: Secondary | ICD-10-CM | POA: Diagnosis not present

## 2017-03-28 DIAGNOSIS — I5032 Chronic diastolic (congestive) heart failure: Secondary | ICD-10-CM

## 2017-03-28 LAB — MYOCARDIAL PERFUSION IMAGING
CHL CUP NUCLEAR SDS: 1
CHL CUP NUCLEAR SRS: 1
CHL CUP RESTING HR STRESS: 58 {beats}/min
LV dias vol: 107 mL (ref 62–150)
LV sys vol: 47 mL
NUC STRESS TID: 0.97
Peak HR: 79 {beats}/min
RATE: 0.41
SSS: 2

## 2017-03-28 LAB — BASIC METABOLIC PANEL
BUN / CREAT RATIO: 15 (ref 10–24)
BUN: 21 mg/dL (ref 8–27)
CHLORIDE: 105 mmol/L (ref 96–106)
CO2: 21 mmol/L (ref 20–29)
Calcium: 9.4 mg/dL (ref 8.6–10.2)
Creatinine, Ser: 1.36 mg/dL — ABNORMAL HIGH (ref 0.76–1.27)
GFR calc Af Amer: 55 mL/min/{1.73_m2} — ABNORMAL LOW (ref >59)
GFR calc non Af Amer: 48 mL/min/{1.73_m2} — ABNORMAL LOW (ref >59)
GLUCOSE: 94 mg/dL (ref 65–99)
POTASSIUM: 4.3 mmol/L (ref 3.5–5.2)
SODIUM: 141 mmol/L (ref 134–144)

## 2017-03-28 MED ORDER — TECHNETIUM TC 99M TETROFOSMIN IV KIT
32.8000 | PACK | Freq: Once | INTRAVENOUS | Status: AC | PRN
  Administered 2017-03-28: 32.8 via INTRAVENOUS
  Filled 2017-03-28: qty 33

## 2017-03-28 MED ORDER — TECHNETIUM TC 99M TETROFOSMIN IV KIT
10.2000 | PACK | Freq: Once | INTRAVENOUS | Status: AC | PRN
  Administered 2017-03-28: 10.2 via INTRAVENOUS
  Filled 2017-03-28: qty 11

## 2017-03-28 MED ORDER — REGADENOSON 0.4 MG/5ML IV SOLN
0.4000 mg | Freq: Once | INTRAVENOUS | Status: AC
  Administered 2017-03-28: 0.4 mg via INTRAVENOUS

## 2017-03-30 DIAGNOSIS — Z01818 Encounter for other preprocedural examination: Secondary | ICD-10-CM | POA: Diagnosis not present

## 2017-03-30 DIAGNOSIS — Z8739 Personal history of other diseases of the musculoskeletal system and connective tissue: Secondary | ICD-10-CM | POA: Diagnosis not present

## 2017-04-05 ENCOUNTER — Ambulatory Visit (INDEPENDENT_AMBULATORY_CARE_PROVIDER_SITE_OTHER): Payer: Medicare Other | Admitting: *Deleted

## 2017-04-05 VITALS — BP 190/78 | HR 58 | Wt 184.6 lb

## 2017-04-05 DIAGNOSIS — I10 Essential (primary) hypertension: Secondary | ICD-10-CM | POA: Diagnosis not present

## 2017-04-05 MED ORDER — AMLODIPINE BESYLATE 10 MG PO TABS: 10.0000 mg | ORAL_TABLET | Freq: Every day | ORAL | 1 refills | Status: DC

## 2017-04-05 NOTE — Patient Instructions (Addendum)
Medication Instructions:   START TAKING AMLODIPINE 10 MG BY MOUTH DAILY     Follow-Up:  FOLLOW-UP AS PLANNED WITH DR. END ON 06/23/17 AT 11:20 AM.  PLEASE BRING A LOG OF BP READINGS WITH YOU TO THIS OFFICE VISIT.      Any Other Special Instructions Will Be Listed Below (If Applicable).  PLEASE TAKE ALL YOUR MEDICATIONS AS PRESCRIBED.  PLEASE TAKE YOUR BP X 1 WEEK (SINCE STARTING ADDITIONAL BP MEDICATION) AND CALL THIS INTO OUR OFFICE TO REPORT TO DR. Darnelle Bos RN AT [phone].       If you need a refill on your cardiac medications before your next appointment, please call your pharmacy.

## 2017-04-05 NOTE — Progress Notes (Signed)
1.) Reason for visit: BP Check per Dr End.  2.) Name of MD requesting visit: Dr. Saunders Revel  3.) H&P: History of HTN and dementia --Pt seen on 11/19 by Dr End.  Noted at that visit he had poorly controlled BP, and wife reported at that visit that she was unsure if she was giving him his Enalapril daily or bid.   On 11/20 wife called Dr Saunders Revel and RN back to report that she was only giving the pt Enalapril 20 mg daily, so then Dr End recommended that the pt increase his Enalapril to 20 mg po bid and come in for BP check on 12/5.  Pt reported today for reassessment of BP since increase in Enalapril on 11/20.    4.) ROS related to problem: 83 year old male here for BP check/ nurse visit today, as advised by Dr End on 11/20.  Per Dr End, the pt was to increase his Enalapril to 20 mg po bid.  Pt has a history of poorly controlled BP, non-compliance, and dementia.  Pts wife manages and prepares his meds at home.  Pts VS at today's visit were :  BP--190/78--HR--58--Resp--18--SPO2--96% RA.  Weight---184.6 lbs.  Pt reported to today's visit alone.  Pts wife was down in the car of the office parking lot.  Pt reports "I think I'm taking all meds prescribed."  Pt reports his wife prepares his meds.  Called the pts wife on the phone and she reports that she did give him all his cardiac meds, and she thinks she only missed giving him his increased dose of Enalapril 20 mg po BID x 1 day.  Wife states "it could've been more than one day, but unsure."Both are poor historians.  Pt has no lower extremity edema, increased sob or doe,  chest pain, blurred vision, dizziness, or light-headedness at this time.  Pt states he occasionally gets Headaches.  Showed the pts Vital signs and elevated BP readings to DOD Dr Johnsie Cancel, and per Dr Johnsie Cancel, the pt should add amlodipine 10 mg po daily to his regimen, and take all other meds exactly as prescribed.   Endorsed new medication addition to both the pt and wife.  Instructions and education provided  to the pt. Both verbalized understanding and agrees with this plan.  Will forward note and plan to ordering Provider and covering RN for their review and further follow-up with the pt.   5.) Assessment and plan per MD: Based on the pts elevated BP of 190/78, DOD Dr. Johnsie Cancel ordered for the pt to add amlodipine 10 mg po daily to his regimen.  Advised both the pt and the wife that the pt should start regimen today, take and log is BP x 1 week, then call this into our office to report to Dr End and his RN.  Advised the wife on the appropriate way to take pts BP and logging this.  Both verbalized understanding and agrees with this plan.

## 2017-04-12 DIAGNOSIS — C44219 Basal cell carcinoma of skin of left ear and external auricular canal: Secondary | ICD-10-CM | POA: Diagnosis not present

## 2017-04-21 ENCOUNTER — Ambulatory Visit: Payer: Medicare Other | Admitting: Physician Assistant

## 2017-05-08 ENCOUNTER — Encounter (INDEPENDENT_AMBULATORY_CARE_PROVIDER_SITE_OTHER): Payer: Self-pay

## 2017-05-08 ENCOUNTER — Ambulatory Visit (INDEPENDENT_AMBULATORY_CARE_PROVIDER_SITE_OTHER): Payer: Medicare Other | Admitting: *Deleted

## 2017-05-08 DIAGNOSIS — Z5181 Encounter for therapeutic drug level monitoring: Secondary | ICD-10-CM | POA: Diagnosis not present

## 2017-05-08 DIAGNOSIS — Z7901 Long term (current) use of anticoagulants: Secondary | ICD-10-CM

## 2017-05-08 DIAGNOSIS — I359 Nonrheumatic aortic valve disorder, unspecified: Secondary | ICD-10-CM

## 2017-05-08 DIAGNOSIS — Z952 Presence of prosthetic heart valve: Secondary | ICD-10-CM

## 2017-05-08 LAB — POCT INR: INR: 3.2

## 2017-05-08 NOTE — Patient Instructions (Signed)
Description   Continue same dosage 1 tablet daily except 1/2 tablet on Sundays and Thursdays. Recheck in 6 weeks.  Call when scheduled for procedure [facility] [phone] with any questions or concerns

## 2017-05-24 DIAGNOSIS — C44712 Basal cell carcinoma of skin of right lower limb, including hip: Secondary | ICD-10-CM | POA: Diagnosis not present

## 2017-05-24 DIAGNOSIS — D485 Neoplasm of uncertain behavior of skin: Secondary | ICD-10-CM | POA: Diagnosis not present

## 2017-05-27 ENCOUNTER — Other Ambulatory Visit: Payer: Self-pay | Admitting: Cardiovascular Disease

## 2017-05-27 ENCOUNTER — Other Ambulatory Visit: Payer: Self-pay | Admitting: Family Medicine

## 2017-05-29 ENCOUNTER — Other Ambulatory Visit: Payer: Self-pay | Admitting: Cardiovascular Disease

## 2017-05-29 DIAGNOSIS — I6523 Occlusion and stenosis of bilateral carotid arteries: Secondary | ICD-10-CM

## 2017-05-30 ENCOUNTER — Other Ambulatory Visit: Payer: Self-pay | Admitting: *Deleted

## 2017-05-30 ENCOUNTER — Ambulatory Visit (HOSPITAL_COMMUNITY)
Admission: RE | Admit: 2017-05-30 | Discharge: 2017-05-30 | Disposition: A | Discharge status: Home / Self Care | Payer: Medicare Other | Source: Ambulatory Visit | Attending: Internal Medicine | Admitting: Internal Medicine

## 2017-05-30 DIAGNOSIS — I6523 Occlusion and stenosis of bilateral carotid arteries: Secondary | ICD-10-CM | POA: Insufficient documentation

## 2017-05-30 MED ORDER — WARFARIN SODIUM 5 MG PO TABS: ORAL_TABLET | ORAL | 3 refills | Status: DC

## 2017-05-31 ENCOUNTER — Telehealth: Payer: Self-pay

## 2017-05-31 DIAGNOSIS — I6523 Occlusion and stenosis of bilateral carotid arteries: Secondary | ICD-10-CM

## 2017-05-31 NOTE — Telephone Encounter (Signed)
-----   Message from Josue Hector, MD sent at 05/31/2017  7:36 AM EST ----- Left ICA 60-79% stenosis.  No TIA symptoms.  Continue antiplatelet Rx   40-59% RICA stenosis.  F/U duplex in 1 year.

## 2017-05-31 NOTE — Telephone Encounter (Signed)
Warfarin rx was sent to pharmacy yesterday 05/30/17.

## 2017-05-31 NOTE — Telephone Encounter (Signed)
Patient aware of carotid results. While on the phone, Patient stated he needed a refill for warfarin. Patient stated he needs it sent [facility], not [facility]. Will forward to [facility].

## 2017-06-08 DIAGNOSIS — R351 Nocturia: Secondary | ICD-10-CM | POA: Diagnosis not present

## 2017-06-08 DIAGNOSIS — N401 Enlarged prostate with lower urinary tract symptoms: Secondary | ICD-10-CM | POA: Diagnosis not present

## 2017-06-08 DIAGNOSIS — N3941 Urge incontinence: Secondary | ICD-10-CM | POA: Diagnosis not present

## 2017-06-12 ENCOUNTER — Encounter: Payer: Self-pay | Admitting: Family Medicine

## 2017-06-12 DIAGNOSIS — N401 Enlarged prostate with lower urinary tract symptoms: Secondary | ICD-10-CM

## 2017-06-12 MED ORDER — MIRABEGRON ER 50 MG PO TB24: 50.0000 mg | ORAL_TABLET | Freq: Every day | ORAL | Status: DC

## 2017-06-12 NOTE — Assessment & Plan Note (Signed)
Begun on Mybetriq by Uro 06/08/17 for frequency.  See scanned note.

## 2017-06-14 DIAGNOSIS — M4712 Other spondylosis with myelopathy, cervical region: Secondary | ICD-10-CM | POA: Diagnosis not present

## 2017-06-15 ENCOUNTER — Telehealth: Payer: Self-pay

## 2017-06-15 ENCOUNTER — Other Ambulatory Visit: Payer: Self-pay | Admitting: Neurological Surgery

## 2017-06-15 NOTE — Telephone Encounter (Signed)
   [facility] Pre-operative Risk Assessment    Request for surgical clearance:  1. What type of surgery is being performed? C5-6 Anterior discectomy with fusion, plate fixation, removal of C3-5 Anterior cervical plate   2. When is this surgery scheduled? 07/07/17   3. What type of clearance is required (medical clearance vs. Pharmacy clearance to hold med vs. Both)? Both  4. Are there any medications that need to be held prior to surgery and how long? Warfarin "Patient is on Warfarin and will need to stop prior to surgery; please advise" and Aspirin   5. Practice name and name of physician performing surgery? [facility] / Dr. Marland Kitchen Ditty   6. What is your office phone and fax number? Phone [phone] Fax [phone]   7. Anesthesia type (None, local, MAC, general) ?unknown   Michaelyn Barter 06/15/2017, 4:51 PM  _________________________________________________________________   (provider comments below)

## 2017-06-16 NOTE — Telephone Encounter (Signed)
   Primary Cardiologist: Nelva Bush, MD  Chart reviewed as part of pre-operative protocol coverage. Patient was contacted 06/16/2017 in reference to pre-operative risk assessment for pending surgery as outlined below.  Juan Li was last seen on 03/20/2017 by Dr End.  At that time, a Lexiscan Myoview was ordered.  The Myoview was without ischemia.  At that time, Dr. Saunders Revel reviewed the results and felt that Juan Li was at acceptable risk for neck surgery without further cardiac testing.  As it has been over 2 months, a phone call is indicated.  I have left messages on his home and cell phone.   Since that day, Juan Li has done (unknown).   Please call with questions.  Rosaria Ferries, PA-C 06/16/2017, 4:04 PM

## 2017-06-19 NOTE — Telephone Encounter (Signed)
   Primary Cardiologist:Christopher End, MD  Chart reviewed as part of pre-operative protocol coverage. Patient has history of aortic stenosis s/p mechanical aortic valve replacement, HFpEF, carotid artery disease, PAD, CKD III, dementia recently seen by Dr. Saunders Revel 03/2017 for pre-operative clearance at which time patient reported longstanding dyspnea. Blood pressure was poorly controlled. Lexiscan nuclear stress was low risk. Patient's BP meds were adjusted and he was seen back by nurse visit 12/5 but BP was found to be 190/78. Patient's dementia made assessment of what he was taking challenging. Nurse called wife but both were found to be poor historians. Further med changes were made with addition of amlodipine. Given his history and recent uncontrolled BP, I believe the safest thing for this patient is to bring him in for a visit prior to surgery to reassess BP and to help definitively coordinate the Lovenox/Coumadin bridge in the perioperative period in a way that everyone can understand. The fact that both he and his wife have previously been deemed poor historians make a phone call quite challenging. Cessation of aspirin can also be discussed at that visit.  He actually has an appointment already scheduled 2/22 with both Dr. Saunders Revel and [facility]. We will keep this appointment - would ask that Dr. Saunders Revel route finalized recommendations to the requesting party below. Will cc him on this note.  Will route this bundled recommendation to requesting provider via Epic fax function. Please call with questions.  Charlie Pitter, PA-C  06/19/2017, 1:44 PM

## 2017-06-20 NOTE — Telephone Encounter (Signed)
Thank you for the update. We will address preop clearance when I see Mr. Gallicchio later this week.  Nelva Bush, MD [facility] Pager: [phone]

## 2017-06-20 NOTE — Telephone Encounter (Signed)
Patient to see Nelva Bush, MD 06/23/17. Preoperative recommendations can be made at that visit regarding risk, anticoagulation, etc. This phone note will be removed from the preop pool. Richardson Dopp, PA-C    06/20/2017 2:21 PM

## 2017-06-21 ENCOUNTER — Other Ambulatory Visit: Payer: Self-pay

## 2017-06-21 ENCOUNTER — Ambulatory Visit (INDEPENDENT_AMBULATORY_CARE_PROVIDER_SITE_OTHER): Payer: Medicare Other | Admitting: Family Medicine

## 2017-06-21 ENCOUNTER — Encounter: Payer: Self-pay | Admitting: Family Medicine

## 2017-06-21 DIAGNOSIS — I6523 Occlusion and stenosis of bilateral carotid arteries: Secondary | ICD-10-CM | POA: Diagnosis not present

## 2017-06-21 DIAGNOSIS — M4802 Spinal stenosis, cervical region: Secondary | ICD-10-CM

## 2017-06-21 DIAGNOSIS — M4712 Other spondylosis with myelopathy, cervical region: Secondary | ICD-10-CM

## 2017-06-21 DIAGNOSIS — I359 Nonrheumatic aortic valve disorder, unspecified: Secondary | ICD-10-CM | POA: Diagnosis not present

## 2017-06-21 DIAGNOSIS — Z7901 Long term (current) use of anticoagulants: Secondary | ICD-10-CM

## 2017-06-21 DIAGNOSIS — Z515 Encounter for palliative care: Secondary | ICD-10-CM | POA: Diagnosis not present

## 2017-06-21 DIAGNOSIS — I10 Essential (primary) hypertension: Secondary | ICD-10-CM

## 2017-06-21 MED ORDER — WARFARIN SODIUM 5 MG PO TABS: ORAL_TABLET | ORAL | 3 refills | Status: DC

## 2017-06-21 NOTE — Patient Instructions (Signed)
Good luck I hope the physical therapy before surgery makes things easier for you. Make sure you are very clear about your medicines, especially your blood thinner prior to surgery. Check you medication list to make sure you and I agree.  If not, call me. Bring a copy of your living will and health care power of attorney with you to the hospital.   Make sure your whole team of doctors is communicating together.

## 2017-06-21 NOTE — Assessment & Plan Note (Signed)
And recent fall.  Surg scheduled for March 8.  Will get PT prior to surgery.

## 2017-06-22 ENCOUNTER — Encounter: Payer: Self-pay | Admitting: Family Medicine

## 2017-06-22 NOTE — Assessment & Plan Note (Signed)
Did not increase antihypertensives due to fall risk.  Near goal at this visit.

## 2017-06-22 NOTE — Assessment & Plan Note (Signed)
For surg.  Fall agrevated bilateral arm symptoms.  Will get preop PT.

## 2017-06-22 NOTE — Assessment & Plan Note (Signed)
Knows to work with cards for [employer] plan pre and post op.

## 2017-06-22 NOTE — Assessment & Plan Note (Signed)
Reaffirmed this status.  Urged patient to bring copy of advanced directives to hospital when checking in for surgery.

## 2017-06-22 NOTE — Progress Notes (Signed)
   Subjective:    Patient ID: Juan Li, male    DOB: [birth year], 84 y.o.   MRN: [id number]  HPI Busy visit.  Two acute problems: 1. Slipped and fell in bathtub 10 days ago.  Struck head but no bleeding or loss of conciousness. Wife was not home at the time.  He could not get up until wife came home ~1 hour later.  Has chronic shoulder and leg pain and weakness.  Complains that both arms and legs are worse since fall.  No worsening confusion.  Had headache for a day, but has subsequently resolved. 2. Hypertensive in the [facility] office.  BP is a bit up today - but he has been all over the place with his BPs.  Non acute problems: 1. Has C spine surg planned for March 8 2. S/P AVR and on chronic coumadin therapy.  Has appointment with cards for cardiac clearance and coumadin dosing prior to surg. 3. Also has ENT appointment prior to surg to help "decide what side to go in on." 4. While this is not particularly high risk surgery, He is 74 and has a significant burden of chronic disease.  Wife believes that they have both a living will and a health care POA.  Confirmed with patient that he wants acute interventions but would not want longterm ventilator support.      Review of Systems     Objective:   Physical Exam Both upper ext week with only 4+ grip strength. Gait: Uses rolling walker.  Walks bent over (urged him to walk more erect.)  Needs walker for balance when turns.       Assessment & Plan:

## 2017-06-23 ENCOUNTER — Ambulatory Visit (INDEPENDENT_AMBULATORY_CARE_PROVIDER_SITE_OTHER)
Admission: RE | Admit: 2017-06-23 | Discharge: 2017-06-23 | Disposition: A | Discharge status: Home / Self Care | Payer: Medicare Other | Source: Ambulatory Visit | Attending: Internal Medicine | Admitting: Internal Medicine

## 2017-06-23 ENCOUNTER — Encounter: Payer: Self-pay | Admitting: Internal Medicine

## 2017-06-23 ENCOUNTER — Ambulatory Visit (INDEPENDENT_AMBULATORY_CARE_PROVIDER_SITE_OTHER): Payer: Medicare Other | Admitting: Internal Medicine

## 2017-06-23 VITALS — BP 136/70 | HR 73 | Ht 66.0 in | Wt 179.0 lb

## 2017-06-23 DIAGNOSIS — I359 Nonrheumatic aortic valve disorder, unspecified: Secondary | ICD-10-CM | POA: Diagnosis not present

## 2017-06-23 DIAGNOSIS — I5030 Unspecified diastolic (congestive) heart failure: Secondary | ICD-10-CM | POA: Diagnosis not present

## 2017-06-23 DIAGNOSIS — Z01810 Encounter for preprocedural cardiovascular examination: Secondary | ICD-10-CM | POA: Diagnosis not present

## 2017-06-23 DIAGNOSIS — R42 Dizziness and giddiness: Secondary | ICD-10-CM | POA: Diagnosis not present

## 2017-06-23 DIAGNOSIS — Z952 Presence of prosthetic heart valve: Secondary | ICD-10-CM

## 2017-06-23 DIAGNOSIS — I10 Essential (primary) hypertension: Secondary | ICD-10-CM | POA: Diagnosis not present

## 2017-06-23 DIAGNOSIS — I739 Peripheral vascular disease, unspecified: Secondary | ICD-10-CM

## 2017-06-23 DIAGNOSIS — W19XXXA Unspecified fall, initial encounter: Secondary | ICD-10-CM

## 2017-06-23 DIAGNOSIS — S0990XA Unspecified injury of head, initial encounter: Secondary | ICD-10-CM | POA: Diagnosis not present

## 2017-06-23 NOTE — Patient Instructions (Addendum)
Medication Instructions:  Your physician recommends that you continue on your current medications as directed. Please refer to the Current Medication list given to you today.  -- If you need a refill on your cardiac medications before your next appointment, please call your pharmacy. --  Labwork: None ordered  Testing/Procedures: Non-Cardiac CT scanning, (CAT scanning), is a noninvasive, special x-ray that produces cross-sectional images of the body using x-rays and a computer. CT scans help physicians diagnose and treat medical conditions. For some CT exams, a contrast material is used to enhance visibility in the area of the body being studied. CT scans provide greater clarity and reveal more details than regular x-ray exams.   Follow-Up: Your physician wants you to follow-up in: 6 months with Dr. Saunders Revel.    You will receive a reminder letter in the mail two months in advance. If you don't receive a letter, please call our office to schedule the follow-up appointment.  Thank you for choosing [facility]!!    Any Other Special Instructions Will Be Listed Below (If Applicable).

## 2017-06-23 NOTE — Progress Notes (Signed)
Follow-up Outpatient Visit Date: 06/23/2017  Primary Care Provider: Zenia Resides, MD [place] [address]  Chief Complaint: Dizziness  HPI:  Mr. Proto is a 84 y.o. year-old male with history of aortic stenosis status post mechanical aortic valve replacement (1993), heart failure with preserved ejection fraction, carotid artery stenosis, peripheral artery disease, chronic kidney disease stage III, and dementia, who presents for follow-up of HFpEF and PAD. I last saw Mr. Jawad in November, which time he reported chronic exertional dyspnea as well as poor balance that he attributed to cervical spine disease. Given potential need for spine surgery, myocardial perfusion stress test was performed on 03/28/17. This was low risk. He is now planning for surgical spine surgery to be done early next month by Dr. Cyndy Freeze.  Today, Mr. Hakim complains predominantly of dizziness, numbness/tingling in his hands, and leg weakness. He slipped and fell in the shower last week, striking the back of his head against the wall shower. He did not pass out or have any external bleeding, though he did hear a "crack" when he fell. He feels as though his dizziness has been a little bit worse since then. He denies chest pain, shortness of breath, palpitations, and leg pain/claudication. His mobility is severely limited by leg weakness and gait instability. He remains compliant with his medications, including warfarin. He has been advised that he will need Lovenox bridging in anticipation of his upcoming C-spine surgery.  --------------------------------------------------------------------------------------------------  Past Medical History:  Diagnosis Date  . Arthritis 02-15-12   osteoarthritis,right knee, pain right wrist.,  . Arthritis    "hands, legs" (12/01/2016)  . Carotid artery disease ([place])    Carotid US 1/18: R 40-59; L 80-99  . Chronic lower back pain   . Complication of  anesthesia 02-15-12   very confused, and very slow to awaken after anesthesia  . Coronary artery disease 02-15-12   Heart valve replaced   . Hypercholesterolemia 02-15-12  . Hypertension   . PAD (peripheral artery disease) ([place])   . S/P AVR (aortic valve replacement)    Mechanical [employer] AVR // Echo 12/17: Moderate LVH, EF 60-65, normal wall motion, grade 1 diastolic dysfunction, mechanical AVR okay with mean gradient 8 mmHg, MAC, mild mitral stenosis (mean gradient 2 mmHg), mild MR, mild RAE, PASP 25   Past Surgical History:  Procedure Laterality Date  . ABDOMINAL AORTAGRAM  12/01/2016  . ABDOMINAL AORTOGRAM N/A 12/01/2016   Procedure: ABDOMINAL AORTOGRAM;  Surgeon: Nelva Bush, MD;  Location: [facility];  Service: Cardiovascular;  Laterality: N/A;  . ANTERIOR CERVICAL DECOMP/DISCECTOMY FUSION       Decompression at the level of C3-C4 and C4-C5, allograft and plate from C3 to C%, microscope/notes 09/14/2010  . BACK SURGERY    . CARDIAC VALVE REPLACEMENT    . CARPAL TUNNEL RELEASE Left 01/2003   Archie Endo 09/14/2010  . JOINT REPLACEMENT    . LOWER EXTREMITY ANGIOGRAPHY  12/01/2016  . LOWER EXTREMITY ANGIOGRAPHY Bilateral 12/01/2016   Procedure: Lower Extremity Angiography;  Surgeon: Nelva Bush, MD;  Location: [facility];  Service: Cardiovascular;  Laterality: Bilateral;  . MECHANICAL AORTIC VALVE REPLACEMENT  1993   Archie Endo 11/18/2016  . SHOULDER OPEN ROTATOR CUFF REPAIR Left 02/15/2012  . TOTAL KNEE ARTHROPLASTY  02/20/2012   Procedure: TOTAL KNEE ARTHROPLASTY;  Surgeon: Gearlean Alf, MD;  Location: [facility];  Service: Orthopedics;  Laterality: Right;  . TRANSURETHRAL RESECTION OF PROSTATE  02/01/2010   Archie Endo 02/06/2010  Past medical and surgical history were reviewed and updated in EPIC.  Current Meds  Medication Sig  . amLODipine (NORVASC) 10 MG tablet TAKE 1 TABLET BY MOUTH EVERY DAY  . aspirin EC 81 MG tablet Take 81 mg by mouth every morning.   .  colchicine 0.6 MG tablet Take 1 tablet by mouth two times a day for one day, then 1 tablet daily for one week.  . donepezil (ARICEPT) 5 MG tablet Take 1 tablet (5 mg total) by mouth daily. (Patient taking differently: Take 5 mg by mouth every evening. )  . enalapril (VASOTEC) 20 MG tablet Take 1 tablet (20 mg total) by mouth 2 (two) times daily. Pt will take 2 of his 10 mg tablets two times a day and use current supply  . finasteride (PROSCAR) 5 MG tablet TAKE 1 TABLET (5 MG TOTAL) BY MOUTH DAILY.  . fluorouracil (EFUDEX) 5 % cream Apply topically daily. To scalp and ears.  Stop when sore.  . hydrochlorothiazide (HYDRODIURIL) 25 MG tablet Take 25 mg daily by mouth.  Marland Kitchen HYDROcodone-acetaminophen (NORCO) 10-325 MG tablet Take 1 tablet by mouth every 6 (six) hours as needed for moderate pain or severe pain.  . memantine (NAMENDA) 5 MG tablet Take 1 tablet (5 mg total) by mouth 2 (two) times daily.  . mirabegron ER (MYRBETRIQ) 50 MG TB24 tablet Take 1 tablet (50 mg total) by mouth daily.  . potassium chloride SA (K-DUR,KLOR-CON) 20 MEQ tablet Take 20 mEq by mouth every other day.  . pravastatin (PRAVACHOL) 40 MG tablet Take 1 tablet (40 mg total) by mouth every evening.  . tamsulosin (FLOMAX) 0.4 MG CAPS capsule TAKE ONE CAPSULE BY MOUTH EVERY DAY  . warfarin (COUMADIN) 5 MG tablet TAKE 1 TABLET AS DIRECTED    Allergies: Patient has no known allergies.  Social History   Socioeconomic History  . Marital status: Married    Spouse name: [name]  . Number of children: 3  . Years of education: Not on file  . Highest education level: Not on file  Social Needs  . Financial resource strain: Not on file  . Food insecurity - worry: Not on file  . Food insecurity - inability: Not on file  . Transportation needs - medical: Not on file  . Transportation needs - non-medical: Not on file  Occupational History  . 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 Smoking  status: Former Smoker    Packs/day: 1.00    Years: 50.00    Pack years: 50.00    Types: Cigarettes    Last attempt to quit: 08/10/1995    Years since quitting: 21.8  . Smokeless tobacco: Never Used  Substance and Sexual Activity  . Alcohol use: Yes    Alcohol/week: 0.0 oz    Comment: 12/01/2016 "nothing in the 2000s"  . Drug use: No  . Sexual activity: Not on file  Other Topics Concern  . Not on file  Social History Narrative   Health Care POA: Wife, [place]   Emergency Contact: wife, [place], [phone] (c)   Philbert Ocall[place]han of Life Plan:   Who lives with you: Patient lives with wife.   Any pets:    Diet: Patient has a varied diet of protein, starch, and vegetables.   Exercise: Patient does not have a regular exercise plan, afraid of falling.   Seatbelts: Patient reports wearing seatbelt when in vehicle.    Nancy Fetter Exposure/Protection: Patient does  not wear sun protection.   Hobbies: Patient likes to do yard work.          Family History  Problem Relation Age of Onset  . Heart disease Mother   . Hyperlipidemia Mother   . Hypertension Mother   . Varicose Veins Mother   . Hyperlipidemia Father   . Hypertension Father   . Heart attack Father     Review of Systems: Review of Systems  Constitutional: Negative.   HENT: Negative.   Eyes: Negative.   Respiratory: Negative.   Cardiovascular: Negative.   Gastrointestinal: Negative.   Genitourinary:       Urinary hesitancy  Musculoskeletal: Positive for falls.  Skin: Negative.   Neurological: Positive for dizziness, tingling and focal weakness (Bilateral leg weakness).  Endo/Heme/Allergies: Negative.   Psychiatric/Behavioral: The patient is nervous/anxious.    --------------------------------------------------------------------------------------------------  Physical Exam: BP 136/70   Pulse 73   Ht 5' 6"  (1.676 m)   Wt 179 lb (81.2 kg)   SpO2 98%   BMI 28.89 kg/m   General:  Overweight, elderly man,  seated in the exam room. He is accompanied by his wife. HEENT: No conjunctival pallor or scleral icterus. Moist mucous membranes.  OP clear. Neck: Supple without lymphadenopathy, thyromegaly, JVD, or HJR.  Lungs: Normal work of breathing. Clear to auscultation bilaterally without wheezes or crackles. Heart: Regular rate and rhythm with mechanical S2. 1/6 systolic murmur loudest at the right upper sternal border. No rubs or gallops. Nondisplaced PMI. Abd: Bowel sounds present. Soft, NT/ND without hepatosplenomegaly Ext: No lower extremity edema. Skin: Warm and dry without rash.  EKG:  Normal sinus rhythm with PACs and left axis deviation. Nonspecific ST changes.  Lab Results  Component Value Date   WBC 13.6 (H) 12/02/2016   HGB 13.8 12/02/2016   HCT 39.4 12/02/2016   MCV 92.9 12/02/2016   PLT 215 12/02/2016    Lab Results  Component Value Date   NA 141 03/28/2017   K 4.3 03/28/2017   CL 105 03/28/2017   CO2 21 03/28/2017   BUN 21 03/28/2017   CREATININE 1.36 (H) 03/28/2017   GLUCOSE 94 03/28/2017   ALT 11 09/14/2016    Lab Results  Component Value Date   CHOL 194 09/14/2016   HDL 42 09/14/2016   LDLCALC 121 (H) 09/14/2016   LDLDIRECT 134.0 02/03/2014   TRIG 156 (H) 09/14/2016   CHOLHDL 4.6 09/14/2016   --------------------------------------------------------------------------------------------------  ASSESSMENT AND PLAN: Fall and dizziness Dizziness has been a long-standing problem for Mr. Oshita but has reportedly become worse since his fall in the shower last week. Owing to his chronic leg weakness and paresthesias that may be related to his cervical spine disease, neurologic assessment is challenging. We will obtain an unenhanced CT of the head today to exclude intracranial trauma/hemorrhage, given that Mr. Yepes is anticoagulated with warfarin.  Heart failure with preserved ejection fraction Mr. Towry appears euvolemic. Functional class assessment is  challenging due to his limited mobility. However, he appears at his baseline. Blood pressure is upper normal today. We will not make any medication changes.  Peripheral vascular disease No claudication reported. We will continue with medical therapy.  Aortic stenosis status post mechanical aortic valve replacement No symptoms or exam findings to suggest valve prosthesis dysfunction. Mr. Yepiz will need to continue on indefinite anticoagulation, including Lovenox bridging for upcoming cervical spine surgery.  Hypertension Blood pressure borderline elevated today. I will not make any medication changes at this time.  Preoperative cardiovascular risk  assessment Mr. Novick is at elevated risk for perioperative cardiovascular complications, owing to his diastolic heart failure, mechanical aortic valve, and peripheral vascular disease. However, myocardial perfusion stress test in November was low risk. I feel that he is optimized from a cardiac standpoint at this time and can proceed with cervical spine surgery without cardiac additional testing. He will need Lovenox bridging in the perioperative period, which our anticoagulation clinic can assist with.  Follow-up: Return to clinic in 6 months.  Nelva Bush, MD 06/24/2017 12:57 PM

## 2017-06-24 ENCOUNTER — Encounter: Payer: Self-pay | Admitting: Internal Medicine

## 2017-06-24 DIAGNOSIS — I5030 Unspecified diastolic (congestive) heart failure: Secondary | ICD-10-CM

## 2017-06-24 DIAGNOSIS — R42 Dizziness and giddiness: Secondary | ICD-10-CM | POA: Insufficient documentation

## 2017-06-24 DIAGNOSIS — W19XXXA Unspecified fall, initial encounter: Secondary | ICD-10-CM | POA: Insufficient documentation

## 2017-06-24 HISTORY — DX: Unspecified diastolic (congestive) heart failure: I50.30

## 2017-06-26 ENCOUNTER — Other Ambulatory Visit: Payer: Medicare Other

## 2017-06-27 ENCOUNTER — Ambulatory Visit (INDEPENDENT_AMBULATORY_CARE_PROVIDER_SITE_OTHER): Payer: Medicare Other | Admitting: *Deleted

## 2017-06-27 DIAGNOSIS — I359 Nonrheumatic aortic valve disorder, unspecified: Secondary | ICD-10-CM | POA: Diagnosis not present

## 2017-06-27 DIAGNOSIS — Z952 Presence of prosthetic heart valve: Secondary | ICD-10-CM

## 2017-06-27 DIAGNOSIS — Z7901 Long term (current) use of anticoagulants: Secondary | ICD-10-CM | POA: Diagnosis not present

## 2017-06-27 DIAGNOSIS — Z5181 Encounter for therapeutic drug level monitoring: Secondary | ICD-10-CM | POA: Diagnosis not present

## 2017-06-27 LAB — POCT INR: INR: 2.6

## 2017-06-27 MED ORDER — ENOXAPARIN SODIUM 80 MG/0.8ML ~~LOC~~ SOLN: 80.0000 mg | Freq: Two times a day (BID) | SUBCUTANEOUS | 1 refills | Status: DC

## 2017-06-27 NOTE — Patient Instructions (Addendum)
Description   Continue same dosage 1 tablet daily except 1/2 tablet on Sundays and Thursdays. Recheck in 1 week after procedure.  Refer to pt instructions for Lovenox bridge instructions.  Call when scheduled for procedure [facility] [phone] with any questions or concerns     07/01/17: Last dose of Coumadin.  07/02/17: No Coumadin or Lovenox.  07/03/17: Inject Lovenox 80 mg in the fatty abdominal tissue at least 2 inches from the belly button twice a day about 12 hours apart, 8am and 8pm rotate sites. No Coumadin.  07/04/17: Inject Lovenox in the fatty tissue every 12 hours, 8am and 8pm. No Coumadin.  07/05/17: Inject Lovenox in the fatty tissue every 12 hours, 8am and 8pm. No Coumadin.  07/06/17: Inject Lovenox in the fatty tissue in the morning at 8 am (No PM dose). No Coumadin.  07/07/17: Procedure Day - No Lovenox - Resume Coumadin in the evening or as directed by doctor, resume normal dose of Coumadin.  07/08/17: Resume Lovenox inject in the fatty tissue every 12 hours at 8am and 8pm and take Coumadin.  07/09/17: Inject Lovenox in the fatty tissue every 12 hours every hours at 8am and 8pmand take Coumadin.  07/10/17: Inject Lovenox in the fatty tissue every 12 hours at 8am and 8pm and take Coumadin.  07/11/17: Inject Lovenox in the fatty tissue every 12 hours at  8am and 8pm and take Coumadin.  07/12/17: Inject Lovenox in the fatty tissue every 12 hours at  8am and 8pm and take Coumadin.  07/13/17: Inject Lovenox in the fatty tissue at 8am & Coumadin appt to check INR.  [facility] [phone]

## 2017-06-28 DIAGNOSIS — N401 Enlarged prostate with lower urinary tract symptoms: Secondary | ICD-10-CM | POA: Diagnosis not present

## 2017-06-28 DIAGNOSIS — R351 Nocturia: Secondary | ICD-10-CM | POA: Diagnosis not present

## 2017-06-28 DIAGNOSIS — N3941 Urge incontinence: Secondary | ICD-10-CM | POA: Diagnosis not present

## 2017-06-29 ENCOUNTER — Telehealth: Payer: Self-pay | Admitting: Family Medicine

## 2017-06-29 NOTE — Telephone Encounter (Signed)
Unclear to me and patient whether surgery was postponed or permenantly canceled.  Patient will call neurosurgeon's office.

## 2017-06-29 NOTE — Telephone Encounter (Signed)
Patient received a call that surgery had been cancelled so he is not sure why. He needs to know what is going on because surgery is schedule for 07/07/17 (or was). Please call to advise, at [phone]

## 2017-07-03 ENCOUNTER — Inpatient Hospital Stay (HOSPITAL_COMMUNITY)
Admission: RE | Admit: 2017-07-03 | Discharge: 2017-07-03 | Disposition: A | Discharge status: Home / Self Care | Payer: Medicare Other | Source: Ambulatory Visit

## 2017-07-03 ENCOUNTER — Other Ambulatory Visit: Payer: Self-pay | Admitting: Neurosurgery

## 2017-07-03 DIAGNOSIS — I10 Essential (primary) hypertension: Secondary | ICD-10-CM | POA: Diagnosis not present

## 2017-07-03 DIAGNOSIS — M4712 Other spondylosis with myelopathy, cervical region: Secondary | ICD-10-CM | POA: Diagnosis not present

## 2017-07-04 ENCOUNTER — Other Ambulatory Visit: Payer: Self-pay | Admitting: Family Medicine

## 2017-07-04 NOTE — Pre-Procedure Instructions (Signed)
Zhi Geier Guise  07/04/2017      [id number] - [address], [address]  [address] Phone: [phone] Fax: [phone]  [facility] [address] [facility] [address] [place] [address] Phone: [phone] Fax: [phone]    Your procedure is scheduled on 07/11/2017.  Report to [facility] Admitting at 1220 P.M.  Call this number if you have problems the morning of surgery:  [phone]   Remember:  Do not eat food or drink liquids after midnight.   Continue all medications as directed by your physician except follow these medication instructions before surgery below   Take these medicines the morning of surgery with A SIP OF WATER: Amlodipine (Norvasc) Colchicine - if needed Donepezil (Aricept) Finasteride (Proscar) Hydrocodone-acetaminophen (Norco) - if needed memantine (Namenda) Riluzole (Rilutek) tamsulosin (Flomax)  7 days prior to surgery STOP taking any Aleve, Naproxen, Ibuprofen, Motrin, Advil, Goody's, BC's, all herbal medications, fish oil, and all vitamins;  Follow your doctors instructions regarding your Aspirin and coumadin.  If no instructions were given by your doctor, then you will need to call the prescribing office office to get instructions.     Do not wear jewelry.  Do not wear lotions, powders, or colognes, or deodorant.  Men may shave face and neck.  Do not bring valuables to the hospital.  [facility] is not responsible for any belongings or valuables.  Hearing aids, eyeglasses, contacts, dentures or bridgework may not be worn into surgery.  Leave your suitcase in the car.  After surgery it may be brought to your room.  For patients admitted to the hospital, discharge time will be determined by your treatment team.  Patients discharged the day of surgery will not be allowed to drive home.   Name and  phone number of your driver:   Special instructions:   [facility]- Preparing For Surgery  Before surgery, you can play an important role. Because skin is not sterile, your skin needs to be as free of germs as possible. You can reduce the number of germs on your skin by washing with CHG (chlorahexidine gluconate) Soap before surgery.  CHG is an antiseptic cleaner which kills germs and bonds with the skin to continue killing germs even after washing.  Please do not use if you have an allergy to CHG or antibacterial soaps. If your skin becomes reddened/irritated stop using the CHG.  Do not shave (including legs and underarms) for at least 48 hours prior to first CHG shower. It is OK to shave your face.  Please follow these instructions carefully.   1. Shower the NIGHT BEFORE SURGERY and the MORNING OF SURGERY with CHG.   2. If you chose to wash your hair, wash your hair first as usual with your normal shampoo.  3. After you shampoo, rinse your hair and body thoroughly to remove the shampoo.  4. Use CHG as you would any other liquid soap. You can apply CHG directly to the skin and wash gently with a scrungie or a clean washcloth.   5. Apply the CHG Soap to your body ONLY FROM THE NECK DOWN.  Do not use on open wounds or open sores. Avoid contact with your eyes, ears, mouth and genitals (private parts). Wash Face and genitals (private parts)  with your normal soap.  6. Wash thoroughly, paying special attention to the area where  your surgery will be performed.  7. Thoroughly rinse your body with warm water from the neck down.  8. DO NOT shower/wash with your normal soap after using and rinsing off the CHG Soap.  9. Pat yourself dry with a CLEAN TOWEL.  10. Wear CLEAN PAJAMAS to bed the night before surgery, wear comfortable clothes the morning of surgery  11. Place CLEAN SHEETS on your bed the night of your first shower and DO NOT SLEEP WITH PETS.    Day of Surgery: Shower as stated  above. Do not apply any deodorants/lotions. Please wear clean clothes to the hospital/surgery center.      Please read over the following fact sheets that you were given.

## 2017-07-05 ENCOUNTER — Encounter (HOSPITAL_COMMUNITY)
Admission: RE | Admit: 2017-07-05 | Discharge: 2017-07-05 | Disposition: A | Discharge status: Home / Self Care | Payer: Medicare Other | Source: Ambulatory Visit | Attending: Neurological Surgery | Admitting: Neurological Surgery

## 2017-07-05 ENCOUNTER — Encounter (HOSPITAL_COMMUNITY): Payer: Self-pay

## 2017-07-05 ENCOUNTER — Other Ambulatory Visit: Payer: Self-pay

## 2017-07-05 ENCOUNTER — Telehealth: Payer: Self-pay | Admitting: Internal Medicine

## 2017-07-05 DIAGNOSIS — Z01818 Encounter for other preprocedural examination: Secondary | ICD-10-CM | POA: Insufficient documentation

## 2017-07-05 DIAGNOSIS — M4712 Other spondylosis with myelopathy, cervical region: Secondary | ICD-10-CM | POA: Insufficient documentation

## 2017-07-05 DIAGNOSIS — M4722 Other spondylosis with radiculopathy, cervical region: Secondary | ICD-10-CM | POA: Diagnosis not present

## 2017-07-05 HISTORY — DX: Major depressive disorder, single episode, unspecified: F32.9

## 2017-07-05 HISTORY — DX: Other amnesia: R41.3

## 2017-07-05 HISTORY — DX: Frequency of micturition: R35.0

## 2017-07-05 HISTORY — DX: Depression, unspecified: F32.A

## 2017-07-05 LAB — TYPE AND SCREEN
ABO/RH(D): B POS
ANTIBODY SCREEN: NEGATIVE

## 2017-07-05 LAB — SURGICAL PCR SCREEN
MRSA, PCR: NEGATIVE
STAPHYLOCOCCUS AUREUS: NEGATIVE

## 2017-07-05 LAB — BASIC METABOLIC PANEL
Anion gap: 13 (ref 5–15)
BUN: 41 mg/dL — ABNORMAL HIGH (ref 6–20)
CALCIUM: 9.4 mg/dL (ref 8.9–10.3)
CHLORIDE: 107 mmol/L (ref 101–111)
CO2: 18 mmol/L — ABNORMAL LOW (ref 22–32)
CREATININE: 1.79 mg/dL — AB (ref 0.61–1.24)
GFR calc non Af Amer: 33 mL/min — ABNORMAL LOW (ref >60)
GFR, EST AFRICAN AMERICAN: 39 mL/min — AB (ref >60)
Glucose, Bld: 108 mg/dL — ABNORMAL HIGH (ref 65–99)
Potassium: 4.6 mmol/L (ref 3.5–5.1)
SODIUM: 138 mmol/L (ref 135–145)

## 2017-07-05 LAB — CBC
HCT: 40.9 % (ref 39.0–52.0)
Hemoglobin: 13.5 g/dL (ref 13.0–17.0)
MCH: 31 pg (ref 26.0–34.0)
MCHC: 33 g/dL (ref 30.0–36.0)
MCV: 94 fL (ref 78.0–100.0)
PLATELETS: 212 10*3/uL (ref 150–400)
RBC: 4.35 MIL/uL (ref 4.22–5.81)
RDW: 14.8 % (ref 11.5–15.5)
WBC: 7.9 10*3/uL (ref 4.0–10.5)

## 2017-07-05 LAB — ABO/RH: ABO/RH(D): B POS

## 2017-07-05 NOTE — Telephone Encounter (Signed)
Returned call to the pt & he stated his surgical date has changed from 07/07/17 to 07/11/17 for his procedure. Wife stated that the doctor that was going to do his surgery had prescribed the pt a medicine called riluzole and the pt had taken it a couple days before they called the doctor's office & questioned why the pt is taken the med and the call the pt was told to stop the med & they stopped the med. She stated the doctor office canceled the procedure and is not at the practice anymore so she had to get pt another surgical date.  Apologized for their inconvenience & inquired how I could assist them today & Wife & husband stated they needed instructions on how to take Lovenox since procedure date has changed.  Gave wife (pt stated to tell his wife so she can write them down) instructions as follows: Pt will take last dose of Coumadin on 07/05/17, 07/06/17-No Coumadin & continue holding Coumadin 07/06/17 thru 07/11/17.  Pt will start Lovenox injections twice a day on 07/07/17 thru 07/09/17 & on 07/10/17 pt will take 8am dose of Lovenox injection ONLY.  07/11/17-procedure date & do not take any Lovenox that day.  07/11/17 after procedure pt will resume regular dose of Coumadin if he is discharged from the hospital or as directed by Surgeon.  On 07/12/17 pt will continue both Lovenox twice a day & Coumadin at regular dose until appt on 07/17/17. Instructed pt/wife to call if pt stays in the hospital longer than expected or if they have any questions.  Wife read the instructions back to me over the phone to ensure accuracy since having to tell her instructions over the phone & instructions were accurate that she read back to me. Wife wrote down the Anticoagulation Number to call if needed at anytime.

## 2017-07-05 NOTE — Telephone Encounter (Signed)
Returned call to the pt & had to leave a msg for the pt to call back so we can answer his question.

## 2017-07-05 NOTE — Progress Notes (Signed)
PCP - Madison Hickman Cardiologist - end  Chest x-ray - not needed EKG - 06/23/17 Stress Test - 2018 ECHO - 2017 Cardiac Cath - denies    Blood Thinner Instructions: last dose of coumadin 3/6, starting lovenox bridge, last lovenox dose 07/10/17 Aspirin Instructions: called dr end office for aspirin instructions  Anesthesia review: yes  Patient denies shortness of breath, fever, cough and chest pain at PAT appointment   Patient verbalized understanding of instructions that were given to them at the PAT appointment. Patient was also instructed that they will need to review over the PAT instructions again at home before surgery.

## 2017-07-05 NOTE — Telephone Encounter (Signed)
New message    Patient calling to verify instructions for Lovenox.

## 2017-07-06 ENCOUNTER — Other Ambulatory Visit: Payer: Self-pay | Admitting: Neurosurgery

## 2017-07-06 DIAGNOSIS — R351 Nocturia: Secondary | ICD-10-CM | POA: Diagnosis not present

## 2017-07-06 DIAGNOSIS — N3941 Urge incontinence: Secondary | ICD-10-CM | POA: Diagnosis not present

## 2017-07-06 DIAGNOSIS — N401 Enlarged prostate with lower urinary tract symptoms: Secondary | ICD-10-CM | POA: Diagnosis not present

## 2017-07-06 NOTE — Progress Notes (Signed)
Anesthesia Chart Review:  Pt is an 84 year old male scheduled for C5-6 ACDF and plate fixation, removal of C3-4, C4-5 anterior plate on 0/99/8338 with Consuella Lose, MD  Providers:  - PCP is Madison Hickman, MD  - Cardiologist is Nelva Bush, MD. Pt cleared for surgery at last office visit 06/23/17 "at elevated risk for perioperative cardiovascular complications, owing to his diastolic heart failure, mechanical aortic valve, and peripheral vascular disease."  "I feel he is optimized from a cardiac standpoint at this time and can proceed with cervical spine surgery without cardiac additional testing"  PMH includes: CAD (from notes, nonobstructive by 1993 cath done prior to AVR), aortic stenosis (s/p AV replacement with [employer] mechanical AV 1993), CHF, carotid artery disease, HTN, hyperlipidemia, PAD, memory loss.  Former smoker (quit in 1997).  BMI 29.  S/p R TKA 02/20/12.  Medications include: Amlodipine, ASA 81 mg, Aricept, enalapril, Lovenox, HCTZ, Namenda, potassium, pravastatin, coumadin. Dr. Saunders Revel gave ok to hold ASA 5 days before surgery; Nikki in Dr. Cleotilde Neer office notified patient. Last dose coumadin 07/05/17, start lovenox 07/07/17, last dose of lovenox morning of 07/10/17  BP (!) 142/56   Pulse (!) 59   Temp 36.7 C (Oral)   Resp 18   Ht 5' 6"  (1.676 m)   Wt 178 lb 6 oz (80.9 kg)   SpO2 98%   BMI 28.79 kg/m    Preoperative labs reviewed.  - Cr 1.79, BUN 41 - PT will be obtained day of surgery  EKG 06/23/17: Sinus rhythm with PACs.  LAD.  Carotid duplex 05/30/17:  - Right Carotid: Velocities in the right ICA are consistent with a 40-59% stenosis. Non-hemodynamically significant plaque <50% noted in the CCA. The ECA appears >50% stenosed. The RICA velocities are elevated and stable compared to the prior exam. - Left Carotid: Velocities in the left ICA are now consistent with a 60-79% stenosis. Non-hemodynamically significant plaque noted in the CCA. The ECA appears >50%  stenosed. The LICA velocities are elevated and have decreased compared to the prior exam. - Vertebrals: Both vertebral arteries were patent with antegrade flow. - Subclavians: Normal flow hemodynamics were seen in bilateral subclavian arteries.  Nuclear stress test 03/28/17:  Nuclear stress EF: 56%.  There was no ST segment deviation noted during stress.  This is a low risk study.  Cardiac event monitor 04/26/16:  - No significant arrhythmias  Echo 04/21/16: - Left ventricle: The cavity size was normal. Wall thickness was increased in a pattern of moderate LVH. Systolic function was normal. The estimated ejection fraction was in the range of 60% to 65%. Wall motion was normal; there were no regional wall motion abnormalities. Doppler parameters are consistent with abnormal left ventricular relaxation (grade 1 diastolic dysfunction). The E/e&' ratio is >15, suggesting elevated LV filling pressure. - Aortic valve: Mechanical bileaflet AVR. No obstruction (AT <100, DVI >0.25). Trivial cleaning AI jet. Mean gradient (S): 8 mm Hg. Peak gradient (S): 17 mm Hg. - Mitral valve: Calcified annulus. Mildly thickened leaflets. Mild stenosis. There was mild regurgitation. Pressure half-time: 129 ms. Valve area by pressure half-time: 1.71 cm^2. Valve area by continuity equation (using LVOT flow): 1.74 cm^2. - Left atrium: The atrium was normal in size. - Right atrium: The atrium was mildly dilated. - Tricuspid valve: There was trivial regurgitation. - Pulmonary arteries: PA peak pressure: 25 mm Hg (S). - Inferior vena cava: The vessel was normal in size. The respirophasic diameter changes were in the normal range (= 50%), consistent with normal  central venous pressure. - Impressions: LVEF 60-65%, moderate LVH, normal wall motion, s/p mechanical bileaflet AVR without obstruction, MAC with mild mitral stenosis and regurgitation, normal LA, trivial TR, RVSP 25 mmHg, normal IVC.  If no changes, I anticipate  pt can proceed with surgery as scheduled.   Willeen Cass, FNP-BC [facility]/Anesthesiology Phone: [phone] 07/06/2017 2:02 PM

## 2017-07-07 ENCOUNTER — Inpatient Hospital Stay: Admit: 2017-07-07 | Payer: Medicare Other | Admitting: Neurological Surgery

## 2017-07-07 SURGERY — ANTERIOR CERVICAL DECOMPRESSION/DISCECTOMY FUSION 1 LEVEL/HARDWARE REMOVAL
Anesthesia: General

## 2017-07-11 ENCOUNTER — Encounter (HOSPITAL_COMMUNITY): Payer: Self-pay | Admitting: *Deleted

## 2017-07-11 ENCOUNTER — Inpatient Hospital Stay (HOSPITAL_COMMUNITY)
Admission: RE | Admit: 2017-07-11 | Discharge: 2017-07-14 | DRG: 472 | Disposition: A | Discharge status: Skilled Nursing Facility | Payer: Medicare Other | Source: Ambulatory Visit | Attending: Neurosurgery | Admitting: Neurosurgery

## 2017-07-11 ENCOUNTER — Ambulatory Visit (HOSPITAL_COMMUNITY): Payer: Medicare Other

## 2017-07-11 ENCOUNTER — Ambulatory Visit (HOSPITAL_COMMUNITY): Payer: Medicare Other | Admitting: Emergency Medicine

## 2017-07-11 ENCOUNTER — Encounter (HOSPITAL_COMMUNITY)
Admission: RE | Disposition: A | Discharge status: Skilled Nursing Facility | Payer: Self-pay | Source: Ambulatory Visit | Attending: Neurosurgery

## 2017-07-11 DIAGNOSIS — Z87891 Personal history of nicotine dependence: Secondary | ICD-10-CM | POA: Diagnosis not present

## 2017-07-11 DIAGNOSIS — I2510 Atherosclerotic heart disease of native coronary artery without angina pectoris: Secondary | ICD-10-CM | POA: Diagnosis present

## 2017-07-11 DIAGNOSIS — K219 Gastro-esophageal reflux disease without esophagitis: Secondary | ICD-10-CM | POA: Diagnosis present

## 2017-07-11 DIAGNOSIS — G992 Myelopathy in diseases classified elsewhere: Secondary | ICD-10-CM | POA: Diagnosis present

## 2017-07-11 DIAGNOSIS — Z952 Presence of prosthetic heart valve: Secondary | ICD-10-CM | POA: Diagnosis not present

## 2017-07-11 DIAGNOSIS — N400 Enlarged prostate without lower urinary tract symptoms: Secondary | ICD-10-CM | POA: Diagnosis present

## 2017-07-11 DIAGNOSIS — M50022 Cervical disc disorder at C5-C6 level with myelopathy: Secondary | ICD-10-CM | POA: Diagnosis not present

## 2017-07-11 DIAGNOSIS — I10 Essential (primary) hypertension: Secondary | ICD-10-CM | POA: Diagnosis present

## 2017-07-11 DIAGNOSIS — I739 Peripheral vascular disease, unspecified: Secondary | ICD-10-CM | POA: Diagnosis present

## 2017-07-11 DIAGNOSIS — R41841 Cognitive communication deficit: Secondary | ICD-10-CM | POA: Diagnosis not present

## 2017-07-11 DIAGNOSIS — M4322 Fusion of spine, cervical region: Secondary | ICD-10-CM | POA: Diagnosis not present

## 2017-07-11 DIAGNOSIS — M545 Low back pain: Secondary | ICD-10-CM | POA: Diagnosis not present

## 2017-07-11 DIAGNOSIS — E7800 Pure hypercholesterolemia, unspecified: Secondary | ICD-10-CM | POA: Diagnosis present

## 2017-07-11 DIAGNOSIS — Z7982 Long term (current) use of aspirin: Secondary | ICD-10-CM | POA: Diagnosis not present

## 2017-07-11 DIAGNOSIS — M4802 Spinal stenosis, cervical region: Principal | ICD-10-CM | POA: Diagnosis present

## 2017-07-11 DIAGNOSIS — Z96651 Presence of right artificial knee joint: Secondary | ICD-10-CM | POA: Diagnosis present

## 2017-07-11 DIAGNOSIS — N183 Chronic kidney disease, stage 3 (moderate): Secondary | ICD-10-CM | POA: Diagnosis not present

## 2017-07-11 DIAGNOSIS — Z85828 Personal history of other malignant neoplasm of skin: Secondary | ICD-10-CM | POA: Diagnosis not present

## 2017-07-11 DIAGNOSIS — M4712 Other spondylosis with myelopathy, cervical region: Secondary | ICD-10-CM | POA: Diagnosis not present

## 2017-07-11 DIAGNOSIS — M6389 Disorders of muscle in diseases classified elsewhere, multiple sites: Secondary | ICD-10-CM | POA: Diagnosis not present

## 2017-07-11 DIAGNOSIS — Z419 Encounter for procedure for purposes other than remedying health state, unspecified: Secondary | ICD-10-CM

## 2017-07-11 DIAGNOSIS — M542 Cervicalgia: Secondary | ICD-10-CM | POA: Diagnosis not present

## 2017-07-11 DIAGNOSIS — R531 Weakness: Secondary | ICD-10-CM | POA: Diagnosis not present

## 2017-07-11 DIAGNOSIS — R32 Unspecified urinary incontinence: Secondary | ICD-10-CM | POA: Diagnosis not present

## 2017-07-11 DIAGNOSIS — M5412 Radiculopathy, cervical region: Secondary | ICD-10-CM | POA: Diagnosis not present

## 2017-07-11 DIAGNOSIS — Z7901 Long term (current) use of anticoagulants: Secondary | ICD-10-CM

## 2017-07-11 DIAGNOSIS — I5032 Chronic diastolic (congestive) heart failure: Secondary | ICD-10-CM | POA: Diagnosis not present

## 2017-07-11 DIAGNOSIS — R278 Other lack of coordination: Secondary | ICD-10-CM | POA: Diagnosis not present

## 2017-07-11 DIAGNOSIS — R2681 Unsteadiness on feet: Secondary | ICD-10-CM | POA: Diagnosis not present

## 2017-07-11 DIAGNOSIS — Z981 Arthrodesis status: Secondary | ICD-10-CM | POA: Diagnosis not present

## 2017-07-11 DIAGNOSIS — I509 Heart failure, unspecified: Secondary | ICD-10-CM | POA: Diagnosis not present

## 2017-07-11 DIAGNOSIS — M109 Gout, unspecified: Secondary | ICD-10-CM | POA: Diagnosis not present

## 2017-07-11 DIAGNOSIS — G959 Disease of spinal cord, unspecified: Secondary | ICD-10-CM | POA: Diagnosis present

## 2017-07-11 HISTORY — PX: ANTERIOR CERVICAL DECOMP/DISCECTOMY FUSION: SHX1161

## 2017-07-11 LAB — PROTIME-INR
INR: 2.32
Prothrombin Time: 25.2 seconds — ABNORMAL HIGH (ref 11.4–15.2)

## 2017-07-11 SURGERY — ANTERIOR CERVICAL DECOMPRESSION/DISCECTOMY FUSION 1 LEVEL/HARDWARE REMOVAL
Anesthesia: General

## 2017-07-11 MED ORDER — BISACODYL 10 MG RE SUPP: 10.0000 mg | Freq: Every day | RECTAL | Status: DC | PRN

## 2017-07-11 MED ORDER — SENNOSIDES-DOCUSATE SODIUM 8.6-50 MG PO TABS: 1.0000 | ORAL_TABLET | Freq: Every evening | ORAL | Status: DC | PRN

## 2017-07-11 MED ORDER — DOCUSATE SODIUM 100 MG PO CAPS
100.0000 mg | ORAL_CAPSULE | Freq: Two times a day (BID) | ORAL | Status: DC
  Administered 2017-07-11 – 2017-07-14 (×6): 100 mg via ORAL
  Filled 2017-07-11 (×6): qty 1

## 2017-07-11 MED ORDER — DEXTROSE 5 % IV SOLN
500.0000 mg | Freq: Four times a day (QID) | INTRAVENOUS | Status: DC | PRN
  Filled 2017-07-11: qty 5

## 2017-07-11 MED ORDER — DEXAMETHASONE SODIUM PHOSPHATE 4 MG/ML IJ SOLN
INTRAMUSCULAR | Status: DC | PRN
  Administered 2017-07-11: 10 mg via INTRAVENOUS

## 2017-07-11 MED ORDER — PROPOFOL 10 MG/ML IV BOLUS
INTRAVENOUS | Status: AC
Start: 2017-07-11 — End: 2017-07-11
  Filled 2017-07-11: qty 20

## 2017-07-11 MED ORDER — ROCURONIUM BROMIDE 100 MG/10ML IV SOLN
INTRAVENOUS | Status: DC | PRN
  Administered 2017-07-11: 20 mg via INTRAVENOUS
  Administered 2017-07-11: 50 mg via INTRAVENOUS
  Administered 2017-07-11: 20 mg via INTRAVENOUS

## 2017-07-11 MED ORDER — SUGAMMADEX SODIUM 200 MG/2ML IV SOLN
INTRAVENOUS | Status: DC | PRN
  Administered 2017-07-11: 200 mg via INTRAVENOUS

## 2017-07-11 MED ORDER — GABAPENTIN 300 MG PO CAPS
300.0000 mg | ORAL_CAPSULE | Freq: Three times a day (TID) | ORAL | Status: DC
  Administered 2017-07-11 – 2017-07-14 (×10): 300 mg via ORAL
  Filled 2017-07-11 (×10): qty 1

## 2017-07-11 MED ORDER — PHENYLEPHRINE HCL 10 MG/ML IJ SOLN
INTRAMUSCULAR | Status: DC | PRN
  Administered 2017-07-11: 80 ug via INTRAVENOUS

## 2017-07-11 MED ORDER — CEFAZOLIN SODIUM-DEXTROSE 2-4 GM/100ML-% IV SOLN
2.0000 g | INTRAVENOUS | Status: AC
  Administered 2017-07-11: 2 g via INTRAVENOUS
  Filled 2017-07-11: qty 100

## 2017-07-11 MED ORDER — BUPIVACAINE HCL (PF) 0.5 % IJ SOLN
INTRAMUSCULAR | Status: AC
  Filled 2017-07-11: qty 30

## 2017-07-11 MED ORDER — HYDROCODONE-ACETAMINOPHEN 5-325 MG PO TABS
1.0000 | ORAL_TABLET | ORAL | Status: DC | PRN
Start: 2017-07-11 — End: 2017-07-15
  Administered 2017-07-12 – 2017-07-14 (×3): 1 via ORAL
  Filled 2017-07-11 (×3): qty 1

## 2017-07-11 MED ORDER — ONDANSETRON HCL 4 MG/2ML IJ SOLN
INTRAMUSCULAR | Status: DC | PRN
  Administered 2017-07-11: 4 mg via INTRAVENOUS

## 2017-07-11 MED ORDER — FENTANYL CITRATE (PF) 100 MCG/2ML IJ SOLN
INTRAMUSCULAR | Status: DC | PRN
  Administered 2017-07-11 (×3): 50 ug via INTRAVENOUS

## 2017-07-11 MED ORDER — SODIUM CHLORIDE 0.9 % IV SOLN
INTRAVENOUS | Status: DC | PRN
  Administered 2017-07-11: 40 ug/min via INTRAVENOUS

## 2017-07-11 MED ORDER — SODIUM CHLORIDE 0.9% FLUSH
3.0000 mL | Freq: Two times a day (BID) | INTRAVENOUS | Status: DC
  Administered 2017-07-11 – 2017-07-14 (×5): 3 mL via INTRAVENOUS

## 2017-07-11 MED ORDER — SODIUM CHLORIDE 0.9% FLUSH: 3.0000 mL | INTRAVENOUS | Status: DC | PRN

## 2017-07-11 MED ORDER — ACETAMINOPHEN 500 MG PO TABS
1000.0000 mg | ORAL_TABLET | Freq: Four times a day (QID) | ORAL | Status: AC
  Administered 2017-07-11 – 2017-07-12 (×4): 1000 mg via ORAL
  Filled 2017-07-11 (×5): qty 2

## 2017-07-11 MED ORDER — LIDOCAINE-EPINEPHRINE 1 %-1:100000 IJ SOLN
INTRAMUSCULAR | Status: AC
  Filled 2017-07-11: qty 1

## 2017-07-11 MED ORDER — LIDOCAINE-EPINEPHRINE 1 %-1:100000 IJ SOLN
INTRAMUSCULAR | Status: DC | PRN
  Administered 2017-07-11: 7 mL

## 2017-07-11 MED ORDER — MENTHOL 3 MG MT LOZG: 1.0000 | LOZENGE | OROMUCOSAL | Status: DC | PRN

## 2017-07-11 MED ORDER — OXYCODONE HCL 5 MG PO TABS
5.0000 mg | ORAL_TABLET | ORAL | Status: DC | PRN
  Administered 2017-07-11: 10 mg via ORAL
  Administered 2017-07-12 – 2017-07-13 (×2): 5 mg via ORAL
  Administered 2017-07-13 – 2017-07-14 (×2): 10 mg via ORAL
  Filled 2017-07-11 (×4): qty 2
  Filled 2017-07-11 (×2): qty 1

## 2017-07-11 MED ORDER — HYDROMORPHONE HCL 1 MG/ML IJ SOLN
INTRAMUSCULAR | Status: AC
  Administered 2017-07-11: 0.5 mg via INTRAVENOUS
  Filled 2017-07-11: qty 1

## 2017-07-11 MED ORDER — THROMBIN 5000 UNITS EX SOLR
CUTANEOUS | Status: AC
  Filled 2017-07-11: qty 5000

## 2017-07-11 MED ORDER — PHENOL 1.4 % MT LIQD: 1.0000 | OROMUCOSAL | Status: DC | PRN

## 2017-07-11 MED ORDER — ACETAMINOPHEN 325 MG PO TABS
650.0000 mg | ORAL_TABLET | ORAL | Status: DC | PRN
  Administered 2017-07-12: 650 mg via ORAL
  Filled 2017-07-11: qty 2

## 2017-07-11 MED ORDER — CHLORHEXIDINE GLUCONATE CLOTH 2 % EX PADS: 6.0000 | MEDICATED_PAD | Freq: Once | CUTANEOUS | Status: DC

## 2017-07-11 MED ORDER — THROMBIN (RECOMBINANT) 5000 UNITS EX SOLR
OROMUCOSAL | Status: DC | PRN
  Administered 2017-07-11: 14:00:00 via TOPICAL

## 2017-07-11 MED ORDER — ROCURONIUM BROMIDE 10 MG/ML (PF) SYRINGE
PREFILLED_SYRINGE | INTRAVENOUS | Status: AC
  Filled 2017-07-11: qty 5

## 2017-07-11 MED ORDER — SODIUM CHLORIDE 0.9 % IR SOLN
Status: DC | PRN
  Administered 2017-07-11: 14:00:00

## 2017-07-11 MED ORDER — MIDAZOLAM HCL 2 MG/2ML IJ SOLN
INTRAMUSCULAR | Status: AC
  Filled 2017-07-11: qty 2

## 2017-07-11 MED ORDER — ZOLPIDEM TARTRATE 5 MG PO TABS: 5.0000 mg | ORAL_TABLET | Freq: Every evening | ORAL | Status: DC | PRN

## 2017-07-11 MED ORDER — FENTANYL CITRATE (PF) 250 MCG/5ML IJ SOLN
INTRAMUSCULAR | Status: AC
  Filled 2017-07-11: qty 5

## 2017-07-11 MED ORDER — BUPIVACAINE HCL 0.5 % IJ SOLN
INTRAMUSCULAR | Status: DC | PRN
  Administered 2017-07-11: 7 mL

## 2017-07-11 MED ORDER — SODIUM CHLORIDE 0.9 % IV SOLN: INTRAVENOUS | Status: DC

## 2017-07-11 MED ORDER — SODIUM CHLORIDE 0.9 % IV SOLN: 250.0000 mL | INTRAVENOUS | Status: DC

## 2017-07-11 MED ORDER — FLEET ENEMA 7-19 GM/118ML RE ENEM: 1.0000 | ENEMA | Freq: Once | RECTAL | Status: DC | PRN

## 2017-07-11 MED ORDER — ONDANSETRON HCL 4 MG PO TABS: 4.0000 mg | ORAL_TABLET | Freq: Four times a day (QID) | ORAL | Status: DC | PRN

## 2017-07-11 MED ORDER — LACTATED RINGERS IV SOLN
INTRAVENOUS | Status: DC
  Administered 2017-07-11 (×2): via INTRAVENOUS

## 2017-07-11 MED ORDER — 0.9 % SODIUM CHLORIDE (POUR BTL) OPTIME
TOPICAL | Status: DC | PRN
  Administered 2017-07-11: 1000 mL

## 2017-07-11 MED ORDER — CEFAZOLIN SODIUM-DEXTROSE 2-4 GM/100ML-% IV SOLN
2.0000 g | Freq: Three times a day (TID) | INTRAVENOUS | Status: AC
  Administered 2017-07-11 – 2017-07-12 (×2): 2 g via INTRAVENOUS
  Filled 2017-07-11 (×2): qty 100

## 2017-07-11 MED ORDER — SENNA 8.6 MG PO TABS
1.0000 | ORAL_TABLET | Freq: Two times a day (BID) | ORAL | Status: DC
  Administered 2017-07-11 – 2017-07-14 (×6): 8.6 mg via ORAL
  Filled 2017-07-11 (×6): qty 1

## 2017-07-11 MED ORDER — HYDROMORPHONE HCL 1 MG/ML IJ SOLN: 0.5000 mg | INTRAMUSCULAR | Status: DC | PRN

## 2017-07-11 MED ORDER — HYDROMORPHONE HCL 1 MG/ML IJ SOLN
0.2500 mg | INTRAMUSCULAR | Status: DC | PRN
  Administered 2017-07-11: 0.5 mg via INTRAVENOUS
  Administered 2017-07-11 (×2): 0.25 mg via INTRAVENOUS

## 2017-07-11 MED ORDER — ACETAMINOPHEN 650 MG RE SUPP
650.0000 mg | RECTAL | Status: DC | PRN
Start: 2017-07-11 — End: 2017-07-15

## 2017-07-11 MED ORDER — PROPOFOL 10 MG/ML IV BOLUS
INTRAVENOUS | Status: DC | PRN
  Administered 2017-07-11: 30 mg via INTRAVENOUS
  Administered 2017-07-11: 90 mg via INTRAVENOUS

## 2017-07-11 MED ORDER — LIDOCAINE HCL (CARDIAC) 20 MG/ML IV SOLN
INTRAVENOUS | Status: DC | PRN
  Administered 2017-07-11: 60 mg via INTRAVENOUS

## 2017-07-11 MED ORDER — METHOCARBAMOL 500 MG PO TABS
500.0000 mg | ORAL_TABLET | Freq: Four times a day (QID) | ORAL | Status: DC | PRN
  Administered 2017-07-11 – 2017-07-13 (×3): 500 mg via ORAL
  Filled 2017-07-11 (×3): qty 1

## 2017-07-11 MED ORDER — ONDANSETRON HCL 4 MG/2ML IJ SOLN: 4.0000 mg | Freq: Four times a day (QID) | INTRAMUSCULAR | Status: DC | PRN

## 2017-07-11 SURGICAL SUPPLY — 76 items
ADH SKN CLS APL DERMABOND .7 (GAUZE/BANDAGES/DRESSINGS) ×1
APL SKNCLS STERI-STRIP NONHPOA (GAUZE/BANDAGES/DRESSINGS)
BAG DECANTER FOR FLEXI CONT (MISCELLANEOUS) ×3 IMPLANT
BASKET BONE COLLECTION (BASKET) ×1 IMPLANT
BENZOIN TINCTURE PRP APPL 2/3 (GAUZE/BANDAGES/DRESSINGS) IMPLANT
BLADE CLIPPER SURG (BLADE) IMPLANT
BLADE SURG 11 STRL SS (BLADE) ×3 IMPLANT
BLADE ULTRA TIP 2M (BLADE) IMPLANT
BUR MATCHSTICK NEURO 3.0 LAGG (BURR) ×3 IMPLANT
CAGE SPINAL NP LORDO 6X17X14 (Cage) ×2 IMPLANT
CANISTER SUCT 3000ML PPV (MISCELLANEOUS) ×3 IMPLANT
CARTRIDGE OIL MAESTRO DRILL (MISCELLANEOUS) ×1 IMPLANT
CLOSURE WOUND 1/2 X4 (GAUZE/BANDAGES/DRESSINGS)
DECANTER SPIKE VIAL GLASS SM (MISCELLANEOUS) ×3 IMPLANT
DERMABOND ADVANCED (GAUZE/BANDAGES/DRESSINGS) ×2
DERMABOND ADVANCED .7 DNX12 (GAUZE/BANDAGES/DRESSINGS) ×1 IMPLANT
DIFFUSER DRILL AIR PNEUMATIC (MISCELLANEOUS) ×3 IMPLANT
DRAIN CHANNEL 10M FLAT 3/4 FLT (DRAIN) IMPLANT
DRAPE C-ARM 42X72 X-RAY (DRAPES) ×6 IMPLANT
DRAPE HALF SHEET 40X57 (DRAPES) IMPLANT
DRAPE LAPAROTOMY 100X72 PEDS (DRAPES) ×3 IMPLANT
DRAPE MICROSCOPE LEICA (MISCELLANEOUS) ×3 IMPLANT
DRAPE POUCH INSTRU U-SHP 10X18 (DRAPES) ×3 IMPLANT
DRSG OPSITE 4X5.5 SM (GAUZE/BANDAGES/DRESSINGS) ×6 IMPLANT
DRSG OPSITE POSTOP 3X4 (GAUZE/BANDAGES/DRESSINGS) ×4 IMPLANT
DURAPREP 6ML APPLICATOR 50/CS (WOUND CARE) ×3 IMPLANT
ELECT COATED BLADE 2.86 ST (ELECTRODE) ×3 IMPLANT
ELECT REM PT RETURN 9FT ADLT (ELECTROSURGICAL) ×3
ELECTRODE REM PT RTRN 9FT ADLT (ELECTROSURGICAL) ×1 IMPLANT
EVACUATOR SILICONE 100CC (DRAIN) IMPLANT
GAUZE SPONGE 4X4 16PLY XRAY LF (GAUZE/BANDAGES/DRESSINGS) IMPLANT
GLOVE BIO SURGEON STRL SZ7.5 (GLOVE) IMPLANT
GLOVE BIOGEL PI IND STRL 6 (GLOVE) IMPLANT
GLOVE BIOGEL PI IND STRL 6.5 (GLOVE) IMPLANT
GLOVE BIOGEL PI IND STRL 7.5 (GLOVE) ×2 IMPLANT
GLOVE BIOGEL PI INDICATOR 6 (GLOVE) ×6
GLOVE BIOGEL PI INDICATOR 6.5 (GLOVE) ×4
GLOVE BIOGEL PI INDICATOR 7.5 (GLOVE) ×6
GLOVE ECLIPSE 6.5 STRL STRAW (GLOVE) ×2 IMPLANT
GLOVE ECLIPSE 7.0 STRL STRAW (GLOVE) ×3 IMPLANT
GLOVE EXAM NITRILE LRG STRL (GLOVE) IMPLANT
GLOVE EXAM NITRILE XL STR (GLOVE) IMPLANT
GLOVE EXAM NITRILE XS STR PU (GLOVE) IMPLANT
GLOVE SURG SS PI 6.5 STRL IVOR (GLOVE) ×2 IMPLANT
GOWN STRL REUS W/ TWL LRG LVL3 (GOWN DISPOSABLE) ×2 IMPLANT
GOWN STRL REUS W/ TWL XL LVL3 (GOWN DISPOSABLE) IMPLANT
GOWN STRL REUS W/TWL 2XL LVL3 (GOWN DISPOSABLE) IMPLANT
GOWN STRL REUS W/TWL LRG LVL3 (GOWN DISPOSABLE) ×9
GOWN STRL REUS W/TWL XL LVL3 (GOWN DISPOSABLE) ×3
HEMOSTAT POWDER KIT SURGIFOAM (HEMOSTASIS) ×3 IMPLANT
KIT BASIN OR (CUSTOM PROCEDURE TRAY) ×3 IMPLANT
KIT ROOM TURNOVER OR (KITS) ×3 IMPLANT
NDL HYPO 25X1 1.5 SAFETY (NEEDLE) ×1 IMPLANT
NDL SPNL 22GX3.5 QUINCKE BK (NEEDLE) ×1 IMPLANT
NEEDLE HYPO 25X1 1.5 SAFETY (NEEDLE) ×3 IMPLANT
NEEDLE SPNL 22GX3.5 QUINCKE BK (NEEDLE) ×3 IMPLANT
NS IRRIG 1000ML POUR BTL (IV SOLUTION) ×3 IMPLANT
OIL CARTRIDGE MAESTRO DRILL (MISCELLANEOUS) ×3
PACK LAMINECTOMY NEURO (CUSTOM PROCEDURE TRAY) ×3 IMPLANT
PAD ARMBOARD 7.5X6 YLW CONV (MISCELLANEOUS) ×9 IMPLANT
PUTTY GRAFTON 1CC (Putty) ×2 IMPLANT
RASP 3.0MM (RASP) ×2 IMPLANT
RUBBERBAND STERILE (MISCELLANEOUS) ×6 IMPLANT
SCREW SPINAL NOP S-D 4.0X13 (Screw) ×4 IMPLANT
SCREW SPINAL NOP S/D 4.0X11 (Screw) ×2 IMPLANT
SCREW SPINAL S/D SZ3.5X15 DIV (Screw) ×2 IMPLANT
SPONGE INTESTINAL PEANUT (DISPOSABLE) ×3 IMPLANT
SPONGE SURGIFOAM ABS GEL SZ50 (HEMOSTASIS) ×3 IMPLANT
STRIP CLOSURE SKIN 1/2X4 (GAUZE/BANDAGES/DRESSINGS) IMPLANT
SUT ETHILON 3 0 FSL (SUTURE) IMPLANT
SUT VIC AB 3-0 SH 8-18 (SUTURE) ×3 IMPLANT
SUT VICRYL 3-0 RB1 18 ABS (SUTURE) ×4 IMPLANT
TAPE CLOTH 3X10 TAN LF (GAUZE/BANDAGES/DRESSINGS) ×5 IMPLANT
TOWEL GREEN STERILE (TOWEL DISPOSABLE) ×3 IMPLANT
TOWEL GREEN STERILE FF (TOWEL DISPOSABLE) ×3 IMPLANT
WATER STERILE IRR 1000ML POUR (IV SOLUTION) ×3 IMPLANT

## 2017-07-11 NOTE — Anesthesia Postprocedure Evaluation (Signed)
Anesthesia Post Note  Patient: Juan Li  Procedure(s) Performed: CERVICAL FIVE-SIX ANTERIOR CERVICAL DECOMPRESSION/DISCECTOMY FUSION AND PLATE FIXATION (N/A )     Patient location during evaluation: PACU Anesthesia Type: General Level of consciousness: awake and alert Pain management: pain level controlled Vital Signs Assessment: post-procedure vital signs reviewed and stable Respiratory status: spontaneous breathing, nonlabored ventilation and respiratory function stable Cardiovascular status: blood pressure returned to baseline and stable Postop Assessment: no apparent nausea or vomiting Anesthetic complications: no    Last Vitals:  Vitals:   07/11/17 1713 07/11/17 1715  BP:  (!) 121/55  Pulse: 63 66  Resp: 11 14  Temp:    SpO2: 96% 97%                  Audry Pili

## 2017-07-11 NOTE — Anesthesia Preprocedure Evaluation (Addendum)
Anesthesia Evaluation  Patient identified by MRN, date of birth, ID band Patient awake    Reviewed: Allergy & Precautions, H&P , NPO status , Patient's Chart, lab work & pertinent test results  Airway Mallampati: II  TM Distance: >3 FB Neck ROM: Full    Dental no notable dental hx. (+) Edentulous Upper, Edentulous Lower, Dental Advisory Given   Pulmonary neg pulmonary ROS, former smoker,    Pulmonary exam normal breath sounds clear to auscultation       Cardiovascular hypertension, Pt. on medications + Peripheral Vascular Disease  + Valvular Problems/Murmurs  Rhythm:Regular Rate:Normal  S/p AVR   Neuro/Psych Depression Dementia negative neurological ROS  negative psych ROS   GI/Hepatic Neg liver ROS, GERD  Controlled,  Endo/Other  negative endocrine ROS  Renal/GU Renal InsufficiencyRenal disease  negative genitourinary   Musculoskeletal  (+) Arthritis , Osteoarthritis,    Abdominal   Peds  Hematology negative hematology ROS (+)   Anesthesia Other Findings   Reproductive/Obstetrics negative OB ROS                            Anesthesia Physical Anesthesia Plan  ASA: III  Anesthesia Plan: General   Post-op Pain Management:    Induction: Intravenous  PONV Risk Score and Plan: 3 and Ondansetron, Dexamethasone and Treatment may vary due to age or medical condition  Airway Management Planned: Oral ETT  Additional Equipment:   Intra-op Plan:   Post-operative Plan: Extubation in OR  Informed Consent: I have reviewed the patients History and Physical, chart, labs and discussed the procedure including the risks, benefits and alternatives for the proposed anesthesia with the patient or authorized representative who has indicated his/her understanding and acceptance.     Plan Discussed with: CRNA  Anesthesia Plan Comments:         Anesthesia Quick Evaluation

## 2017-07-11 NOTE — Transfer of Care (Signed)
Immediate Anesthesia Transfer of Care Note  Patient: Juan Li  Procedure(s) Performed: CERVICAL FIVE-SIX ANTERIOR CERVICAL DECOMPRESSION/DISCECTOMY FUSION AND PLATE FIXATION (N/A )  Patient Location: PACU  Anesthesia Type:General  Level of Consciousness: awake, alert , oriented and patient cooperative  Airway & Oxygen Therapy: Patient Spontanous Breathing and Patient connected to nasal cannula oxygen  Post-op Assessment: Report given to RN and Post -op Vital signs reviewed and stable  Post vital signs: Reviewed and stable  Last Vitals:  Vitals:   07/11/17 1228 07/11/17 1613  BP: (!) 164/59 117/70  Pulse:  70  Resp:  11  Temp:    SpO2:  95%    Last Pain:  Vitals:   07/11/17 1227  TempSrc: Oral      Patients Stated Pain Goal: 3 (85/46/27 0350)  Complications: No apparent anesthesia complications

## 2017-07-11 NOTE — Op Note (Signed)
PREOP DIAGNOSIS: Cervical stenosis with myelopathy, C5-6  POSTOP DIAGNOSIS: Same  PROCEDURE: 1. Discectomy at C5-6 for decompression of spinal cord and exiting nerve roots  2. Placement of intervertebral biomechanical device [employer] zero-profile 32mm PEEK graft 3. Use of morselized bone allograft  4. Arthrodesis C5-6, anterior interbody technique  5. Use of intraoperative microscope  SURGEON: Dr. Consuella Lose, MD  ASSISTANT: Dr. Ferne Reus, PA-C, MD  ANESTHESIA: General Endotracheal  EBL: 50cc  SPECIMENS: None  DRAINS: None  COMPLICATIONS: None immediate  CONDITION: Hemodynamically stable to PACU  HISTORY: Juan Li is a 84 y.o. y.o. male who initially presented to the outpatient clinic with neck pain, imbalance, and incoordination. MRI demonstrated adjacent segment stenosis at C5-6 after previous ACDF from C3-C5. Treatment options were discussed including repeat anterior cervical discectomy and fusion.  He previously underwent evaluation by ENT and was not found to have any evidence of vocal fold paralysis.  After all questions were answered, informed consent was obtained.  PROCEDURE IN DETAIL: The patient was brought to the operating room and transferred to the operative table. After induction of general anesthesia, the patient was positioned on the operative table in the supine position with all pressure points meticulously padded. The skin of the neck was then prepped and draped in the usual sterile fashion.  After timeout was conducted, the skin was infiltrated with local anesthetic.  Right-sided transverse skin incision was then made sharply and Bovie electrocautery was used to dissect the subcutaneous tissue until the platysma was identified. The platysma was then divided and undermined. The sternocleidomastoid muscle was then identified and, utilizing natural fascial planes in the neck, the prevertebral fascia was identified and the carotid sheath was  retracted laterally and the trachea and esophagus retracted medially.  The previous plate was identified at its inferior margin, and the immediately adjacent C5-6 disc space was identified.  Intraoperative fluoroscopy was used to confirm our location at the correct level.  Bovie electrocautery was used to dissect in the subperiosteal plane and elevate the bilateral longus coli muscles. Self-retaining retractors were then placed. At this point, the microscope was draped and brought into the field, and the remainder of the case was done under the microscope using microdissecting technique.  The disc space was incised sharply and rongeurs were use to initially complete a discectomy. The high-speed drill was then used to complete discectomy until the posterior annulus was identified and removed and the posterior longitudinal ligament was identified. Using a nerve hook, the PLL was elevated, and Kerrison rongeurs were used to remove the posterior longitudinal ligament and the ventral thecal sac was identified. Using a combination of curettes and rongeurs, complete decompression of the thecal sac and exiting nerve roots at this level was completed, and verified using micro-nerve hook.  Having completed our decompression, attention was turned to placement of the intervertebral device. Trial spacers were used to select a 35mm graft. This graft was then filled with morcellized allograft, and inserted.  Position was confirmed with lateral fluoroscopy.  In order to place the screws into the graft, it became clear that a portion of the previously placed anterior cervical plate would have to be removed in order to obtain adequate angulation inferiorly into the C6 vertebral body.  The drill cutting bur was then used to remove a portion of the previously placed cervical plate after the right-sided inferior screw was removed.   We then selected 15 mm screws which were placed through the zero profile interbody device.  The  inferior screw appeared to have good position, but was slightly long, and the superiorly directed screw appeared to be along the C5 endplate and not definitively within the C5 vertebral body.  I therefore removed both screws and placed a 13 mm rescue screw in the C6 vertebral body and an 11 mm rescue screw in the C5 vertebral body after the superiorly directed pilot hole was remade with a more superior angulation.  Final fluoroscopic images in AP and lateral projections were taken to confirm good hardware placement.  At this point, after all counts were verified to be correct, meticulous hemostasis was secured using a combination of bipolar electrocautery and passive hemostatics. The platysma muscle was then closed using interrupted 3-0 Vicryl sutures, and the skin was closed with an interrupted 3-0 Vicry subcuticular stitch. Dermabond and sterile dressings were then applied and the drapes removed.  The patient tolerated the procedure well and was extubated in the room and taken to the postanesthesia care unit in stable condition.  At the end of the case all sponge, needle, instrument, and cottonoid counts were correct.

## 2017-07-11 NOTE — Plan of Care (Signed)
  Progressing Safety: Ability to remain free from injury will improve 07/11/2017 2248 - Progressing by Pricilla Holm, Perline Awe D, RN Activity: Ability to avoid complications of mobility impairment will improve 07/11/2017 2248 - Progressing by Pricilla Holm, Brigg Cape D, RN Ability to tolerate increased activity will improve 07/11/2017 2248 - Progressing by Charlena Cross, RN Will remain free from falls 07/11/2017 2248 - Progressing by Margot Chimes D, RN Bowel/Gastric: Gastrointestinal status for postoperative course will improve 07/11/2017 2248 - Progressing by Charlena Cross, RN Education: Ability to verbalize activity precautions or restrictions will improve 07/11/2017 2248 - Progressing by Margot Chimes D, RN Knowledge of the prescribed therapeutic regimen will improve 07/11/2017 2248 - Progressing by Charlena Cross, RN Understanding of discharge needs will improve 07/11/2017 2248 - Progressing by Charlena Cross, RN Physical Regulation: Ability to maintain clinical measurements within normal limits will improve 07/11/2017 2248 - Progressing by Pricilla Holm, Amarianna Abplanalp D, RN Postoperative complications will be avoided or minimized 07/11/2017 2248 - Progressing by Charlena Cross, RN Diagnostic test results will improve 07/11/2017 2248 - Progressing by Charlena Cross, RN Pain Management: Pain level will decrease 07/11/2017 2248 - Progressing by Charlena Cross, RN Skin Integrity: Signs of wound healing will improve 07/11/2017 2248 - Progressing by Margot Chimes D, RN Health Behavior/Discharge Planning: Identification of resources available to assist in meeting health care needs will improve 07/11/2017 2248 - Progressing by Margot Chimes D, RN Bladder/Genitourinary: Urinary functional status for postoperative course will improve 07/11/2017 2248 - Progressing by Charlena Cross, RN

## 2017-07-11 NOTE — H&P (Signed)
Chief Complaint   Neck pain  HPI   HPI: Juan Li is a 84 y.o. male with history of chronic neck pain, gait imbalance and b/d hand incoordination. He is s/o ACDF C3-5. Due to concern over adjacent level disease, he underwent cervical MRI. MRI significant for adjacent level broad-based disc protrusion with moderately severe stenosis at C5-6. It was recommended that he undergo C5-6 ACDF. Presents today for surgery. No concerns.  Patient Active Problem List   Diagnosis Date Noted  . Fall 06/24/2017  . Dizziness 06/24/2017  . (HFpEF) heart failure with preserved ejection fraction ([place]) 06/24/2017  . Peripheral vascular disease ([place]) 12/16/2016  . Acute gout involving toe of left foot 12/16/2016  . Claudication in peripheral vascular disease ([place]) 11/19/2016  . Non-healing wound of lower extremity 11/19/2016  . Cervical spondylosis with myelopathy 01/28/2016  . Metatarsalgia of left foot 07/30/2015  . Encounter for palliative care 11/21/2014  . Carotid artery disease ([place]) 07/18/2014  . Stenosis of cervical spine region 07/18/2014  . Encounter for chronic pain management 04/09/2014  . Bradycardia 10/06/2013  . Unspecified vitamin D deficiency 11/30/2012  . Chronic diastolic CHF (congestive heart failure) ([place]) 08/21/2012  . OA (osteoarthritis) of knee 02/20/2012  . Gait abnormality 09/02/2011  . Aortic valve disorder 06/28/2010  . Long term current use of anticoagulant therapy 06/28/2010  . H/O mechanical aortic valve replacement 06/28/2010  . AAA 01/20/2010  . HEART VALVE REPLACEMENT, HX OF 12/31/2008  . CKD (chronic kidney disease) stage 3, GFR 30-59 ml/min (HCC) 08/06/2008  . Dementia 05/25/2007  . OSTEOARTHROSIS, GENERALIZED, MULTIPLE SITES 09/18/2006  . HYPERCHOLESTEROLEMIA 06/29/2006  . Gouty arthropathy 06/29/2006  . CARPAL TUNNEL SYNDROME 06/29/2006  . Essential hypertension 06/29/2006  . GASTROESOPHAGEAL REFLUX, NO ESOPHAGITIS 06/29/2006  . BPH (benign prostatic  hyperplasia) 06/29/2006  . Skin cancer 06/29/2006  . BACK PAIN, LOW 06/29/2006  . ROTATOR CUFF TENDONITIS 06/29/2006  . TOBACCO USE, QUIT 06/29/2006    PMH: Past Medical History:  Diagnosis Date  . Arthritis 02-15-12   osteoarthritis,right knee, pain right wrist.,  . Arthritis    "hands, legs" (12/01/2016)  . Carotid artery disease ([place])    Carotid US 1/18: R 40-59; L 80-99  . Chronic lower back pain   . Complication of anesthesia 02-15-12   very confused, and very slow to awaken after anesthesia  . Coronary artery disease 02-15-12   Heart valve replaced   . Depression   . Hypercholesterolemia 02-15-12  . Hypertension   . Memory loss   . PAD (peripheral artery disease) ([place])   . S/P AVR (aortic valve replacement)    Mechanical [employer] AVR // Echo 12/17: Moderate LVH, EF 60-65, normal wall motion, grade 1 diastolic dysfunction, mechanical AVR okay with mean gradient 8 mmHg, MAC, mild mitral stenosis (mean gradient 2 mmHg), mild MR, mild RAE, PASP 25  . Urinary frequency     PSH: Past Surgical History:  Procedure Laterality Date  . ABDOMINAL AORTAGRAM  12/01/2016  . ABDOMINAL AORTOGRAM N/A 12/01/2016   Procedure: ABDOMINAL AORTOGRAM;  Surgeon: Nelva Bush, MD;  Location: [facility];  Service: Cardiovascular;  Laterality: N/A;  . ANTERIOR CERVICAL DECOMP/DISCECTOMY FUSION       Decompression at the level of C3-C4 and C4-C5, allograft and plate from C3 to C%, microscope/notes 09/14/2010  . BACK SURGERY    . CARDIAC VALVE REPLACEMENT    . CARPAL TUNNEL RELEASE Left 01/2003   Archie Endo 09/14/2010  . JOINT REPLACEMENT    .  LOWER EXTREMITY ANGIOGRAPHY  12/01/2016  . LOWER EXTREMITY ANGIOGRAPHY Bilateral 12/01/2016   Procedure: Lower Extremity Angiography;  Surgeon: Nelva Bush, MD;  Location: [facility];  Service: Cardiovascular;  Laterality: Bilateral;  . MECHANICAL AORTIC VALVE REPLACEMENT  1993   Archie Endo 11/18/2016  . SHOULDER OPEN ROTATOR CUFF REPAIR Left  02/15/2012  . TOTAL KNEE ARTHROPLASTY  02/20/2012   Procedure: TOTAL KNEE ARTHROPLASTY;  Surgeon: Gearlean Alf, MD;  Location: [facility];  Service: Orthopedics;  Laterality: Right;  . TRANSURETHRAL RESECTION OF PROSTATE  02/01/2010   Archie Endo 02/06/2010    Medications Prior to Admission  Medication Sig Dispense Refill Last Dose  . amLODipine (NORVASC) 10 MG tablet TAKE 1 TABLET BY MOUTH EVERY DAY 30 tablet 10 07/11/2017 at 0900  . aspirin EC 81 MG tablet Take 81 mg by mouth every morning.    Past Week at Unknown time  . colchicine 0.6 MG tablet Take 1 tablet by mouth two times a day for one day, then 1 tablet daily for one week. (Patient taking differently: Take 0.6 mg by mouth daily as needed (gout). Take 1 tablet by mouth two times a day for one day, then 1 tablet daily for one week.) 30 tablet 3 Past Month at Unknown time  . donepezil (ARICEPT) 5 MG tablet Take 1 tablet (5 mg total) by mouth daily. (Patient taking differently: Take 5 mg by mouth every morning. ) 90 tablet 3 07/11/2017 at 0900  . enalapril (VASOTEC) 20 MG tablet Take 1 tablet (20 mg total) by mouth 2 (two) times daily. Pt will take 2 of his 10 mg tablets two times a day and use current supply   07/11/2017 at 0900  . enoxaparin (LOVENOX) 80 MG/0.8ML injection Inject 0.8 mLs (80 mg total) into the skin every 12 (twelve) hours. 20 Syringe 1 07/10/2017 at Unknown time  . hydrochlorothiazide (HYDRODIURIL) 25 MG tablet Take 25 mg daily by mouth.   07/10/2017 at Unknown time  . HYDROcodone-acetaminophen (NORCO) 10-325 MG tablet Take 1 tablet by mouth every 6 (six) hours as needed for moderate pain or severe pain. 360 tablet 0 Past Week at Unknown time  . memantine (NAMENDA) 5 MG tablet Take 1 tablet (5 mg total) by mouth 2 (two) times daily. 180 tablet 3 07/11/2017 at 0900  . potassium chloride SA (K-DUR,KLOR-CON) 20 MEQ tablet Take 20 mEq by mouth every other day.   Past Month at Unknown time  . pravastatin (PRAVACHOL) 40 MG tablet Take 1  tablet (40 mg total) by mouth every evening. (Patient taking differently: Take 40 mg by mouth 3 (three) times a week. ) 90 tablet 3 Past Week at Unknown time  . tamsulosin (FLOMAX) 0.4 MG CAPS capsule TAKE ONE CAPSULE BY MOUTH EVERY DAY 90 capsule 3 07/11/2017 at 0900  . Vitamin D, Ergocalciferol, (DRISDOL) 50000 units CAPS capsule Take 50,000 Units by mouth every Wednesday.   Past Week at Unknown time  . warfarin (COUMADIN) 5 MG tablet TAKE 1 TABLET AS DIRECTED (Patient taking differently: Take 2.5-5 mg by mouth See admin instructions. TAKE 1 TABLET AS DIRECTED - 5 mg all nights, except 2.5 mg Sunday and Thursday) 100 tablet 3 Past Week at Unknown time  . finasteride (PROSCAR) 5 MG tablet TAKE 1 TABLET (5 MG TOTAL) BY MOUTH DAILY. 90 tablet 3 Unknown at Unknown time  . mirabegron ER (MYRBETRIQ) 50 MG TB24 tablet Take 1 tablet (50 mg total) by mouth daily. 30 tablet  Unknown at Unknown time  .  riluzole (RILUTEK) 50 MG tablet Take 50 mg by mouth every 12 (twelve) hours. To take 2 weeks before and 2 weeks after procedure - July 07, 2017   Unknown at Unknown time    SH: Social History   Tobacco Use  . Smoking status: Former Smoker    Packs/day: 1.00    Years: 50.00    Pack years: 50.00    Types: Cigarettes    Last attempt to quit: 08/10/1995    Years since quitting: 21.9  . Smokeless tobacco: Never Used  Substance Use Topics  . Alcohol use: No    Alcohol/week: 0.0 oz    Frequency: Never    Comment: 12/01/2016 "nothing in the 2000s"  . Drug use: No    MEDS: Prior to Admission medications   Medication Sig Start Date End Date Taking? Authorizing Provider  amLODipine (NORVASC) 10 MG tablet TAKE 1 TABLET BY MOUTH EVERY DAY 05/29/17  Yes Josue Hector, MD  aspirin EC 81 MG tablet Take 81 mg by mouth every morning.  04/20/11  Yes Larey Dresser, MD  colchicine 0.6 MG tablet Take 1 tablet by mouth two times a day for one day, then 1 tablet daily for one week. Patient taking differently: Take  0.6 mg by mouth daily as needed (gout). Take 1 tablet by mouth two times a day for one day, then 1 tablet daily for one week. 01/30/17  Yes [name], Jamal Collin, MD  donepezil (ARICEPT) 5 MG tablet Take 1 tablet (5 mg total) by mouth daily. Patient taking differently: Take 5 mg by mouth every morning.  09/15/16  Yes [name], Jamal Collin, MD  enalapril (VASOTEC) 20 MG tablet Take 1 tablet (20 mg total) by mouth 2 (two) times daily. Pt will take 2 of his 10 mg tablets two times a day and use current supply 03/22/17  Yes End, Harrell Gave, MD  enoxaparin (LOVENOX) 80 MG/0.8ML injection Inject 0.8 mLs (80 mg total) into the skin every 12 (twelve) hours. 06/27/17  Yes Larey Dresser, MD  hydrochlorothiazide (HYDRODIURIL) 25 MG tablet Take 25 mg daily by mouth.   Yes [provider]  HYDROcodone-acetaminophen (NORCO) 10-325 MG tablet Take 1 tablet by mouth every 6 (six) hours as needed for moderate pain or severe pain. 01/11/17  Yes [name], Jamal Collin, MD  memantine (NAMENDA) 5 MG tablet Take 1 tablet (5 mg total) by mouth 2 (two) times daily. 09/15/16  Yes [name], Jamal Collin, MD  potassium chloride SA (K-DUR,KLOR-CON) 20 MEQ tablet Take 20 mEq by mouth every other day.   Yes [provider]  pravastatin (PRAVACHOL) 40 MG tablet Take 1 tablet (40 mg total) by mouth every evening. Patient taking differently: Take 40 mg by mouth 3 (three) times a week.  09/15/16  Yes [name], Jamal Collin, MD  tamsulosin (FLOMAX) 0.4 MG CAPS capsule TAKE ONE CAPSULE BY MOUTH EVERY DAY 03/07/17  Yes [name], Jamal Collin, MD  Vitamin D, Ergocalciferol, (DRISDOL) 50000 units CAPS capsule Take 50,000 Units by mouth every Wednesday.   Yes [provider]  warfarin (COUMADIN) 5 MG tablet TAKE 1 TABLET AS DIRECTED Patient taking differently: Take 2.5-5 mg by mouth See admin instructions. TAKE 1 TABLET AS DIRECTED - 5 mg all nights, except 2.5 mg Sunday and Thursday 06/21/17  Yes [name], Jamal Collin, MD  finasteride (PROSCAR) 5 MG  tablet TAKE 1 TABLET (5 MG TOTAL) BY MOUTH DAILY. 05/29/17   Zenia Resides, MD  mirabegron ER (MYRBETRIQ) 50 MG TB24 tablet  Take 1 tablet (50 mg total) by mouth daily. 06/12/17   Zenia Resides, MD  riluzole (RILUTEK) 50 MG tablet Take 50 mg by mouth every 12 (twelve) hours. To take 2 weeks before and 2 weeks after procedure - July 07, 2017    [provider]    ALLERGY: No Known Allergies  Social History   Tobacco Use  . Smoking status: Former Smoker    Packs/day: 1.00    Years: 50.00    Pack years: 50.00    Types: Cigarettes    Last attempt to quit: 08/10/1995    Years since quitting: 21.9  . Smokeless tobacco: Never Used  Substance Use Topics  . Alcohol use: No    Alcohol/week: 0.0 oz    Frequency: Never    Comment: 12/01/2016 "nothing in the 2000s"     Family History  Problem Relation Age of Onset  . Heart disease Mother   . Hyperlipidemia Mother   . Hypertension Mother   . Varicose Veins Mother   . Hyperlipidemia Father   . Hypertension Father   . Heart attack Father      ROS   ROS  Exam   Vitals:   07/11/17 1227 07/11/17 1228  BP:  (!) 164/59  Pulse: 62   Resp: 20   Temp: 98.7 F (37.1 C)   SpO2: 100%    General appearance: WDWN  Eyes: PERRL, Fundoscopic: normal Cardiovascular: Regular rate and rhythm without murmurs, rubs, gallops. No edema or variciosities. Distal pulses normal. Pulmonary: Clear to auscultation Musculoskeletal:     Gait: ambulates with cane    Muscle tone upper extremities: Normal    Muscle tone lower extremities: Normal    Motor exam: Upper Extremities Deltoid Bicep Tricep Grip  Right 5/5 5/5 5/5 5/5  Left 5/5 5/5 5/5 5/5   Lower Extremity IP Quad PF DF EHL  Right 5/5 5/5 5/5 5/5 5/5  Left 5/5 5/5 5/5 5/5 5/5   Neurological Awake, alert, oriented Memory and concentration grossly intact Speech fluent, appropriate CNII: Visual fields normal CNIII/IV/VI: EOMI CNV: Facial sensation normal CNVII: Symmetric,  normal strength CNVIII: Grossly normal CNIX: Normal palate movement CNXI: Trap and SCM strength normal CN XII: Tongue protrusion normal Sensation grossly intact to LT DTR: Normal + Hoffmans b/l  Results - Imaging/Labs   No results found for this or any previous visit (from the past 48 hour(s)).  No results found.  Impression/Plan   84 y.o. male with cervical myelopathy likely related to stenosis at C5-6 adjacent to previous C3-5 ACDF. It was recommended that he undergo C5-6 diskectomy and likely placement of zero profile interbody device in the hope of avoiding the removal of the previous C3-5 plate.There is a possibility of need for plate removal. While in the office, risks of surgery were discussed in great detailed which include but are not limited to spinal cord injury, post op dysphagia, neck hematoma, CSF leak and risks of anesthesia like DVT, PE and MI. The patient stated understanding and wished to proceed.

## 2017-07-11 NOTE — Anesthesia Procedure Notes (Signed)
Procedure Name: Intubation Date/Time: 07/11/2017 1:53 PM Performed by: Roderic Palau, MD Pre-anesthesia Checklist: Patient identified, Emergency Drugs available, Suction available, Patient being monitored and Timeout performed Patient Re-evaluated:Patient Re-evaluated prior to induction Oxygen Delivery Method: Circle system utilized Preoxygenation: Pre-oxygenation with 100% oxygen Induction Type: Combination inhalational/ intravenous induction Ventilation: Mask ventilation without difficulty and Oral airway inserted - appropriate to patient size Laryngoscope Size: Mac and 3 Grade View: Grade I Tube size: 7.5 mm Airway Equipment and Method: Stylet and Oral airway Placement Confirmation: ETT inserted through vocal cords under direct vision,  positive ETCO2 and breath sounds checked- equal and bilateral Secured at: 21 cm Dental Injury: Teeth and Oropharynx as per pre-operative assessment

## 2017-07-12 LAB — CBC
HCT: 40.3 % (ref 39.0–52.0)
Hemoglobin: 13.7 g/dL (ref 13.0–17.0)
MCH: 31.5 pg (ref 26.0–34.0)
MCHC: 34 g/dL (ref 30.0–36.0)
MCV: 92.6 fL (ref 78.0–100.0)
Platelets: 223 10*3/uL (ref 150–400)
RBC: 4.35 MIL/uL (ref 4.22–5.81)
RDW: 14.5 % (ref 11.5–15.5)
WBC: 15.3 10*3/uL — ABNORMAL HIGH (ref 4.0–10.5)

## 2017-07-12 LAB — BASIC METABOLIC PANEL
Anion gap: 12 (ref 5–15)
BUN: 25 mg/dL — ABNORMAL HIGH (ref 6–20)
CO2: 21 mmol/L — ABNORMAL LOW (ref 22–32)
Calcium: 9.3 mg/dL (ref 8.9–10.3)
Chloride: 103 mmol/L (ref 101–111)
Creatinine, Ser: 1.45 mg/dL — ABNORMAL HIGH (ref 0.61–1.24)
GFR calc Af Amer: 50 mL/min — ABNORMAL LOW (ref >60)
GFR calc non Af Amer: 43 mL/min — ABNORMAL LOW (ref >60)
Glucose, Bld: 133 mg/dL — ABNORMAL HIGH (ref 65–99)
Potassium: 4.1 mmol/L (ref 3.5–5.1)
Sodium: 136 mmol/L (ref 135–145)

## 2017-07-12 LAB — PROTIME-INR
INR: 2.33
Prothrombin Time: 25.4 s — ABNORMAL HIGH (ref 11.4–15.2)

## 2017-07-12 LAB — APTT: aPTT: 42 seconds — ABNORMAL HIGH (ref 24–36)

## 2017-07-12 NOTE — Evaluation (Signed)
Occupational Therapy Evaluation Patient Details Name: Juan Li MRN: [id number] DOB: 04-19-1932 Today's Date: 07/12/2017    History of Present Illness 84 y.o. 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  Now s/p ACDF at C5-6.  PMH: HTN, PAD, CAD, s/p AVR, arthritis, memory loss, BPH, GERD.   Clinical Impression   This 84 y/o M presents with the above. At baseline pt reports being mod independent with ADLs and functional mobility using RW. Pt presenting with cognitive impairments, generalized weakness, decreased dynamic balance. Pt completing room and hallway functional mobility with overall modA+2 using RW this session, currently requires MaxA for LB ADLs. Pt will benefit from continued acute OT services and recommend additional OT services in SNF setting after discharge to maximize his overall strength, safety and independence with ADLs and mobility prior to return home.     Follow Up Recommendations  SNF;Supervision/Assistance - 24 hour    Equipment Recommendations  Other (comment)(TBD in next venue )           Precautions / Restrictions Precautions Precautions: Fall;Cervical Precaution Booklet Issued: Yes (comment) Required Braces or Orthoses: Cervical Brace Cervical Brace: Hard collar Restrictions Weight Bearing Restrictions: No      Mobility Bed Mobility Overal bed mobility: Needs Assistance Bed Mobility: Rolling;Sidelying to Sit Rolling: Mod assist Sidelying to sit: Mod assist;HOB elevated;+2 for safety/equipment       General bed mobility comments: cues for technique, assist for lifting trunk, assist for safety  Transfers Overall transfer level: Needs assistance Equipment used: Rolling walker (2 wheeled) Transfers: Sit to/from Stand Sit to Stand: Mod assist;+2 physical assistance         General transfer comment: assist to lift from EOB    Balance Overall balance assessment: Needs  assistance   Sitting balance-Leahy Scale: Fair     Standing balance support: Bilateral upper extremity supported Standing balance-Leahy Scale: Poor Standing balance comment: UE support and assist for balance                           ADL either performed or assessed with clinical judgement   ADL Overall ADL's : Needs assistance/impaired Eating/Feeding: Set up;Sitting   Grooming: Set up;Sitting   Upper Body Bathing: Minimal assistance;Sitting   Lower Body Bathing: Moderate assistance;+2 for physical assistance;+2 for safety/equipment;Sit to/from stand   Upper Body Dressing : Minimal assistance;Sitting Upper Body Dressing Details (indicate cue type and reason): doffing/donning new gown  Lower Body Dressing: Maximal assistance;+2 for physical assistance;+2 for safety/equipment;Sit to/from stand   Toilet Transfer: Moderate assistance;+2 for physical assistance;+2 for safety/equipment;BSC;RW;Ambulation Toilet Transfer Details (indicate cue type and reason): BSC over toilet; simulated through transfer to [place] and Hygiene: Total assistance;Bed level;+2 for physical assistance Toileting - Clothing Manipulation Details (indicate cue type and reason): pt requiring total assist for peri-care after incontinent BM this session      Functional mobility during ADLs: Moderate assistance;+2 for physical assistance;+2 for safety/equipment;Rolling walker;Cueing for safety General ADL Comments: pt noted to have incontinent BM upon entering room, required total assist +2 to complete peri-care at bed level; pt completing room and hallway level functional mobility using RW with overall ModA+2, requires cues for safety and safe use of RW, cues for maintaining upright position as pt demonstrating bil knee buckling with mobility  Pertinent Vitals/Pain Pain Assessment: Faces Faces Pain Scale: Hurts a little bit Pain Location:  neck soreness Pain Descriptors / Indicators: Sore Pain Intervention(s): Monitored during session          Extremity/Trunk Assessment Upper Extremity Assessment Upper Extremity Assessment: Generalized weakness(to be further assessed, pt reports difficulty gripping RW )   Lower Extremity Assessment Lower Extremity Assessment: Defer to PT evaluation RLE Deficits / Details: AROM WFL strength not formally tested, but weakness evident with ambulation due to LE's buckling slowly and cues needed to extend legs to prevent falling LLE Deficits / Details: AROM WFL strength not formally tested, but weakness evident with ambulation due to LE's buckling slowly and cues needed to extend legs to prevent falling   Cervical / Trunk Assessment Cervical / Trunk Assessment: Kyphotic   Communication Communication Communication: No difficulties   Cognition Arousal/Alertness: Awake/alert Behavior During Therapy: WFL for tasks assessed/performed Overall Cognitive Status: No family/caregiver present to determine baseline cognitive functioning                                 General Comments: per chart review pt with baseline memory loss/dementia    General Comments  Patient soiled with stool in bed and unaware, assist for cleaning in supine, RN reports has had a few other episodes of incontinence               Home Living Family/patient expects to be discharged to:: Skilled nursing facility Living Arrangements: Spouse/significant other Available Help at Discharge: Family Type of Home: House Home Access: Stairs to enter Technical brewer of Steps: 3   Home Layout: Multi-level(split level ) Alternate Level Stairs-Number of Steps: 4 Alternate Level Stairs-Rails: Right Bathroom Shower/Tub: Walk-in shower         Home Equipment: Environmental consultant - 4 wheels;Walker - 2 wheels;Shower seat          Prior Functioning/Environment Level of Independence: Independent with assistive  device(s)        Comments: walked with walker; reports fall in shower approx 4 weeks ago         OT Problem List: Decreased strength;Decreased activity tolerance;Decreased range of motion;Impaired balance (sitting and/or standing);Decreased cognition;Decreased safety awareness;Decreased knowledge of precautions;Decreased knowledge of use of DME or AE      OT Treatment/Interventions: Self-care/ADL training;DME and/or AE instruction;Therapeutic activities;Balance training;Therapeutic exercise;Energy conservation;Patient/family education    OT Goals(Current goals can be found in the care plan section) Acute Rehab OT Goals Patient Stated Goal: felt good to walk OT Goal Formulation: With patient Time For Goal Achievement: 07/26/17 Potential to Achieve Goals: Good  OT Frequency: Min 2X/week               Co-evaluation PT/OT/SLP Co-Evaluation/Treatment: Yes Reason for Co-Treatment: For patient/therapist safety;To address functional/ADL transfers PT goals addressed during session: Mobility/safety with mobility;Balance;Proper use of DME OT goals addressed during session: ADL's and self-care;Proper use of Adaptive equipment and DME      AM-PAC PT "6 Clicks" Daily Activity     Outcome Measure Help from another person eating meals?: None Help from another person taking care of personal grooming?: A Little Help from another person toileting, which includes using toliet, bedpan, or urinal?: Total Help from another person bathing (including washing, rinsing, drying)?: A Lot Help from another person to put on and taking off regular upper body clothing?: A Lot Help from another person to put on and taking off regular lower body clothing?:  A Lot 6 Click Score: 14   End of Session Equipment Utilized During Treatment: Gait belt;Rolling walker;Cervical collar Nurse Communication: Mobility status  Activity Tolerance: Patient tolerated treatment well Patient left: in chair;with call  bell/phone within reach;with nursing/sitter in room  OT Visit Diagnosis: Unsteadiness on feet (R26.81);Muscle weakness (generalized) (M62.81)                Time: 7824-2353 OT Time Calculation (min): 36 min Charges:  OT General Charges $OT Visit: 1 Visit OT Evaluation $OT Eval Moderate Complexity: 1 Mod G-Codes:     Lou Cal, OT Pager [phone] 07/12/2017   Raymondo Band 07/12/2017, 2:57 PM

## 2017-07-12 NOTE — Evaluation (Signed)
Physical Therapy Evaluation Patient Details Name: Juan Li MRN: [id number] DOB: 02-Jan-1932 Today's Date: 07/12/2017   History of Present Illness  84 y.o. 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  Now s/p ACDF at C5-6.  PMH: HTN, PAD, CAD, s/p AVR, arthritis, memory loss, BPH, GERD.  Clinical Impression  Patient presents with decreased independence and safety with mobility due to deficits listed in PT problem list.  Currently he needs +2 mod A for mobility in hallway due to severe LE weakness and poor safety awareness.  Feel he will need SNF level PT at d/c.  PT to follow acutely as well.     Follow Up Recommendations SNF;Supervision/Assistance - 24 hour    Equipment Recommendations  None recommended by PT    Recommendations for Other Services       Precautions / Restrictions Precautions Precautions: Fall;Cervical Required Braces or Orthoses: Cervical Brace Cervical Brace: Hard collar Restrictions Weight Bearing Restrictions: No      Mobility  Bed Mobility Overal bed mobility: Needs Assistance Bed Mobility: Rolling;Sidelying to Sit Rolling: Mod assist Sidelying to sit: Mod assist;HOB elevated;+2 for safety/equipment       General bed mobility comments: cues for technique, assist for lifting trunk, assist for safety  Transfers Overall transfer level: Needs assistance Equipment used: Rolling walker (2 wheeled) Transfers: Sit to/from Stand Sit to Stand: Mod assist;+2 physical assistance         General transfer comment: assist to lift from EOB  Ambulation/Gait Ambulation/Gait assistance: Mod assist;+2 physical assistance Ambulation Distance (Feet): 90 Feet Assistive device: Rolling walker (2 wheeled) Gait Pattern/deviations: Step-to pattern;Step-through pattern;Decreased stride length;Trunk flexed;Wide base of support;Shuffle;Drifts right/left     General Gait Details: assist to keep  walker close, frequent stops to encourage trunk and LE extension, assist for balance, tends to shuffle feet and let walker get too far ahead extremely high fall risk  Stairs            Wheelchair Mobility    Modified Rankin (Stroke Patients Only)       Balance Overall balance assessment: Needs assistance   Sitting balance-Leahy Scale: Fair     Standing balance support: Bilateral upper extremity supported Standing balance-Leahy Scale: Poor Standing balance comment: UE support and assist for balance                             Pertinent Vitals/Pain Pain Assessment: Faces Faces Pain Scale: Hurts a little bit Pain Location: neck sorness Pain Descriptors / Indicators: Sore Pain Intervention(s): Monitored during session    Home Living Family/patient expects to be discharged to:: Skilled nursing facility Living Arrangements: Spouse/significant other Available Help at Discharge: Family Type of Home: House Home Access: Stairs to enter   Technical brewer of Steps: 3 Home Layout: Multi-level(split level) Home Equipment: Environmental consultant - 4 wheels;Walker - 2 wheels;Shower seat      Prior Function Level of Independence: Independent with assistive device(s)         Comments: walked with walker     Hand Dominance        Extremity/Trunk Assessment        Lower Extremity Assessment Lower Extremity Assessment: RLE deficits/detail;LLE deficits/detail RLE Deficits / Details: AROM WFL strength not formally tested, but weakness evident with ambulation due to LE's buckling slowly and cues needed to extend legs to prevent falling LLE Deficits / Details: AROM WFL strength not formally  tested, but weakness evident with ambulation due to LE's buckling slowly and cues needed to extend legs to prevent falling    Cervical / Trunk Assessment Cervical / Trunk Assessment: Kyphotic  Communication   Communication: No difficulties  Cognition Arousal/Alertness:  Awake/alert Behavior During Therapy: WFL for tasks assessed/performed Overall Cognitive Status: No family/caregiver present to determine baseline cognitive functioning                                        General Comments General comments (skin integrity, edema, etc.): Patient soiled with stool in bed and unaware, assist for cleaning in supine, RN reports has had a few other episodes of incontinence    Exercises     Assessment/Plan    PT Assessment Patient needs continued PT services  PT Problem List Decreased activity tolerance;Decreased balance;Decreased cognition;Decreased knowledge of use of DME;Pain;Decreased safety awareness;Decreased mobility;Decreased strength;Decreased coordination;Decreased knowledge of precautions       PT Treatment Interventions DME instruction;Therapeutic activities;Gait training;Therapeutic exercise;Patient/family education;Stair training;Balance training;Functional mobility training    PT Goals (Current goals can be found in the Care Plan section)  Acute Rehab PT Goals Patient Stated Goal: felt good to walk PT Goal Formulation: With patient Time For Goal Achievement: 07/26/17 Potential to Achieve Goals: Fair    Frequency Min 5X/week   Barriers to discharge        Co-evaluation PT/OT/SLP Co-Evaluation/Treatment: Yes Reason for Co-Treatment: For patient/therapist safety;To address functional/ADL transfers PT goals addressed during session: Mobility/safety with mobility;Balance;Proper use of DME         AM-PAC PT "6 Clicks" Daily Activity  Outcome Measure Difficulty turning over in bed (including adjusting bedclothes, sheets and blankets)?: Unable Difficulty moving from lying on back to sitting on the side of the bed? : Unable Difficulty sitting down on and standing up from a chair with arms (e.g., wheelchair, bedside commode, etc,.)?: Unable Help needed moving to and from a bed to chair (including a wheelchair)?: A  Lot Help needed walking in hospital room?: A Lot Help needed climbing 3-5 steps with a railing? : Total 6 Click Score: 8    End of Session Equipment Utilized During Treatment: Gait belt;Cervical collar Activity Tolerance: Patient limited by fatigue Patient left: in chair;with call bell/phone within reach;with nursing/sitter in room Nurse Communication: Mobility status PT Visit Diagnosis: Other abnormalities of gait and mobility (R26.89);Muscle weakness (generalized) (M62.81);History of falling (Z91.81);Other symptoms and signs involving the nervous system (C16.384)    Time: 5364-6803 PT Time Calculation (min) (ACUTE ONLY): 18 min   Charges:   PT Evaluation $PT Eval Moderate Complexity: 1 Mod     PT G CodesMagda Kiel, [place] [phone] 07/12/2017   Reginia Naas 07/12/2017, 1:28 PM

## 2017-07-12 NOTE — Progress Notes (Addendum)
Neurosurgery Progress Note  No issues overnight. Has been having significant difficulty with ambulation since a fall 4 weeks ago. Nursing unable to get him up to ambulate Neck soreness as expected.  No concerns this am  EXAM:  BP (!) 157/72 (BP Location: Right Arm)   Pulse 83   Temp 98.5 F (36.9 C) (Oral)   Resp 18   SpO2 97%   Awake, alert, oriented  Speech fluent, appropriate  CN grossly intact  MAEW with good strength Wound c/d/i  PLAN Doing fair this am. Significant difficulties with ambulation since a fall 4 weeks ago that he had not mentioned to anyone until this am. Still has good strength in legs. He may need to go to rehab for safety reasons prior to going home. He is very hesitant about rehab - states he has a lot of help at home. Able to convince him rehab is in his best interest due to safety reasons and he agrees. CSW consult for placement.

## 2017-07-12 NOTE — Progress Notes (Signed)
Pt transferred to 4N08 via chair. Spoke with Pt's wife to inform her of the change in rooms. Report called to RN. Holli Humbles, RN

## 2017-07-13 ENCOUNTER — Encounter (HOSPITAL_COMMUNITY): Payer: Self-pay | Admitting: Neurosurgery

## 2017-07-13 DIAGNOSIS — R32 Unspecified urinary incontinence: Secondary | ICD-10-CM | POA: Diagnosis not present

## 2017-07-13 DIAGNOSIS — I5032 Chronic diastolic (congestive) heart failure: Secondary | ICD-10-CM | POA: Diagnosis not present

## 2017-07-13 DIAGNOSIS — I10 Essential (primary) hypertension: Secondary | ICD-10-CM | POA: Diagnosis not present

## 2017-07-13 DIAGNOSIS — R531 Weakness: Secondary | ICD-10-CM | POA: Diagnosis not present

## 2017-07-13 DIAGNOSIS — M4712 Other spondylosis with myelopathy, cervical region: Secondary | ICD-10-CM | POA: Diagnosis not present

## 2017-07-13 DIAGNOSIS — Z7901 Long term (current) use of anticoagulants: Secondary | ICD-10-CM | POA: Diagnosis not present

## 2017-07-13 DIAGNOSIS — M109 Gout, unspecified: Secondary | ICD-10-CM | POA: Diagnosis not present

## 2017-07-13 NOTE — Care Management Note (Signed)
Case Management Note  Patient Details  Name: Juan Li MRN: [id number] Date of Birth: [birth year]  Subjective/Objective:   84 y.o. 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  Now s/p ACDF at C5-6.  PTA, pt resided at home with spouse and used RW for ambulation.   PCP is Dr. Madison Hickman.                Action/Plan: PT/OT recommending SNF for rehab.  CSW consulted to facilitate dc to SNF upon medical stability.  Will follow progress.  Expected Discharge Date:                  Expected Discharge Plan:  Skilled Nursing Facility  In-House Referral:  Clinical Social Work  Discharge planning Services  CM Consult  Post Acute Care Choice:    Choice offered to:     DME Arranged:    DME Agency:     HH Arranged:    [place] Agency:     Status of Service:  In process, will continue to follow  If discussed at Long Length of Stay Meetings, dates discussed:    Additional Comments:  Reinaldo Raddle, RN, BSN  Trauma/Neuro ICU Case Manager [phone]

## 2017-07-13 NOTE — Clinical Social Work Note (Addendum)
Clinical Social Work Assessment  Patient Details  Name: Juan Li MRN: [id number] Date of Birth: [birth year]  Date of referral:  07/13/17               Reason for consult:  Facility Placement, [employer]                Permission sought to share information with:  Facility Sport and exercise psychologist, Family Supports Permission granted to share information::  Yes, Verbal Permission Granted  Name::     [place]::     Relationship::wife  Contact Information:  [phone]  Housing/Transportation Living arrangements for the past 2 months:  [place] of Information:  Patient, Spouse Patient Interpreter Needed:  None Criminal Activity/Legal Involvement Pertinent to Current Situation/Hospitalization:  No - Comment as needed Significant Relationships:  Adult Children, Other Family Members, Spouse Lives with:  Spouse Do you feel safe going back to the place where you live?  Yes Need for family participation in patient care:  Yes (Comment)(split level home, needs support with mobility etc. )  Care giving concerns:  Pt had fallen in the shower and has been dependent with mobility since, pt lives in a split level home and would like to get stronger so he can navigate his home again. Is amenable to SNF placement for continued therapies.   Social Worker assessment / plan:  CSW met with pt at bedside, pt wife also present and engaged in assessment. Pt amenable to SNF since discussing with medical care team. States he fell in the shower and has had a hard time since, knows that his surgery will mean that he needs continued therapy. Pt wife states that she is able to help just not with level of mobility support currently needed. Pt was at [facility] in 2013, requests to see all options. Pt has family in multiple states as well as close by, pt states that he feels well supported.   Employment status:  Retired Forensic scientist:  [employer] PT Recommendations:   [place], [place] / Referral to community resources:  [place]  Patient/Family's Response to care:  Pt and pt wife pleasant and friendly to CSW, appreciative of support to find SNF.   Patient/Family's Understanding of and Emotional Response to Diagnosis, Current Treatment, and Prognosis:  Pt states understanding of diagnosis, current treatment, and prognosis and is currently more amenable to SNF. Pt wife also expresses understanding, both pt and pt wife pleasant and appropriate in affect and emotion to care needs and CSW faxing out pt to SNF.   Emotional Assessment Appearance:  Appears stated age Attitude/Demeanor/Rapport:  Engaged, Gracious Affect (typically observed):  Accepting, Appropriate, Calm, Stable Orientation:  Oriented to Self, Oriented to Place, Oriented to  Time, Oriented to Situation Alcohol / Substance use:  Tobacco Use(former smoker quit 1997) Psych involvement (Current and /or in the community):  No (Comment)  Discharge Needs  Concerns to be addressed:  Care Coordination, Discharge Planning Concerns Readmission within the last 30 days:    Current discharge risk:  Dependent with Mobility, Physical Impairment Barriers to Discharge:  Ship broker, Continued Medical Work up   [employer], [place] 07/13/2017, 11:26 AM

## 2017-07-13 NOTE — Progress Notes (Signed)
Patient ID: Juan Li, male   DOB: Aug 16, 1931, 84 y.o.   MRN: [id number] Patient doing well still with condition neck pain not much changed hands but he did ambulate a little bit today   strength appears to be 4+ out of 5 diffusely weak but nonfocal incision clean dry and intact   awaiting rehab placement

## 2017-07-13 NOTE — Progress Notes (Signed)
Physical Therapy Treatment Patient Details Name: LAWSEN ARNOTT MRN: [id number] DOB: 10-Jul-1931 Today's Date: 07/13/2017    History of Present Illness 84 y.o. 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  Now s/p ACDF at C5-6.  PMH: HTN, PAD, CAD, s/p AVR, arthritis, memory loss, BPH, GERD.    PT Comments    Pt progressing slowly.  He is having trouble with weakness and coordination of bil LE's in standing/ambulation and difficulty managing the RW with hands.  Emphasis on scooting, sit to stand, gait stability and stamina as well as cervical education.   Follow Up Recommendations  SNF;Supervision/Assistance - 24 hour     Equipment Recommendations  None recommended by PT    Recommendations for Other Services       Precautions / Restrictions Precautions Precautions: Fall;Cervical Precaution Booklet Issued: Yes (comment) Required Braces or Orthoses: Cervical Brace Cervical Brace: Hard collar    Mobility  Bed Mobility Overal bed mobility: Needs Assistance Bed Mobility: Rolling;Sit to Sidelying Rolling: Mod assist       Sit to sidelying: Mod assist;+2 for physical assistance General bed mobility comments: cues/demo of technique.  assist for control of upper body to sidelying and assist with legs into the bed.  Transfers Overall transfer level: Needs assistance   Transfers: Sit to/from Stand Sit to Stand: Mod assist;+2 physical assistance         General transfer comment: cues for hand placement and assist to come forward and boost.  Ambulation/Gait Ambulation/Gait assistance: Mod assist;+2 physical assistance Ambulation Distance (Feet): 30 Feet(and 25 feet with rest in between.) Assistive device: Rolling walker (2 wheeled) Gait Pattern/deviations: Step-through pattern   Gait velocity interpretation: Below normal speed for age/gender General Gait Details: cues for safest technique, Assist for  support and stability while amb with RW.  Assist for stabilizing RW and keeping pt more approx in RW.   Stairs            Wheelchair Mobility    Modified Rankin (Stroke Patients Only)       Balance Overall balance assessment: Needs assistance   Sitting balance-Leahy Scale: Fair     Standing balance support: Bilateral upper extremity supported Standing balance-Leahy Scale: Poor Standing balance comment: reliant on external support and RW for assist                            Cognition Arousal/Alertness: Awake/alert Behavior During Therapy: WFL for tasks assessed/performed Overall Cognitive Status: Within Functional Limits for tasks assessed                                        Exercises      General Comments General comments (skin integrity, edema, etc.): Reinforced neck care/prec, log roll, expected slower progression.      Pertinent Vitals/Pain Pain Assessment: Faces Faces Pain Scale: Hurts a little bit Pain Location: neck Pain Descriptors / Indicators: Guarding    Home Living                      Prior Function            PT Goals (current goals can now be found in the care plan section) Acute Rehab PT Goals Patient Stated Goal: walk better PT Goal Formulation: With patient Time For Goal Achievement:  07/26/17 Potential to Achieve Goals: Fair Progress towards PT goals: Progressing toward goals    Frequency    Min 5X/week      PT Plan Current plan remains appropriate    Co-evaluation              AM-PAC PT "6 Clicks" Daily Activity  Outcome Measure  Difficulty turning over in bed (including adjusting bedclothes, sheets and blankets)?: Unable Difficulty moving from lying on back to sitting on the side of the bed? : Unable Difficulty sitting down on and standing up from a chair with arms (e.g., wheelchair, bedside commode, etc,.)?: Unable Help needed moving to and from a bed to chair (including a  wheelchair)?: A Lot Help needed walking in hospital room?: A Lot Help needed climbing 3-5 steps with a railing? : Total 6 Click Score: 8    End of Session Equipment Utilized During Treatment: Gait belt;Cervical collar Activity Tolerance: Patient limited by fatigue Patient left: in bed;with call bell/phone within reach Nurse Communication: Mobility status PT Visit Diagnosis: Other abnormalities of gait and mobility (R26.89);Muscle weakness (generalized) (M62.81);History of falling (Z91.81);Other symptoms and signs involving the nervous system (R29.898)     Time: 1430-1456 PT Time Calculation (min) (ACUTE ONLY): 26 min  Charges:  $Gait Training: 8-22 mins $Therapeutic Activity: 8-22 mins                    G Codes:       2017-07-20  Donnella Sham, PT [phone]  (pager)   Tessie Fass Emarion Toral 2017-07-20, 4:45 PM

## 2017-07-13 NOTE — NC FL2 (Signed)
[place] LEVEL OF CARE SCREENING TOOL     IDENTIFICATION  Patient Name: Juan Li Birthdate: 06-25-1931 Sex: male Admission Date (Current Location): 07/11/2017  [facility] and [place] Number:  Herbalist and Address:  [facility] [place], [place] [address], [place], [place] [address]      Provider Number: [id number]  Attending Physician Name and Address:  Consuella Lose, MD  Relative Name and Phone Number:  Toniann Fail   [phone]  Current Level of Care: Hospital Recommended Level of Care: [place] Prior Approval Number:    Date Approved/Denied:   PASRR Number: [id number]  Discharge Plan: SNF    Current Diagnoses: Patient Active Problem List   Diagnosis Date Noted  . Cervical myelopathy ([place]) 07/11/2017  . Fall 06/24/2017  . Dizziness 06/24/2017  . (HFpEF) heart failure with preserved ejection fraction ([place]) 06/24/2017  . Peripheral vascular disease ([place]) 12/16/2016  . Acute gout involving toe of left foot 12/16/2016  . Claudication in peripheral vascular disease ([place]) 11/19/2016  . Non-healing wound of lower extremity 11/19/2016  . Cervical spondylosis with myelopathy 01/28/2016  . Metatarsalgia of left foot 07/30/2015  . Encounter for palliative care 11/21/2014  . Carotid artery disease ([place]) 07/18/2014  . Stenosis of cervical spine region 07/18/2014  . Encounter for chronic pain management 04/09/2014  . Bradycardia 10/06/2013  . Unspecified vitamin D deficiency 11/30/2012  . Chronic diastolic CHF (congestive heart failure) ([place]) 08/21/2012  . OA (osteoarthritis) of knee 02/20/2012  . Gait abnormality 09/02/2011  . Aortic valve disorder 06/28/2010  . Long term current use of anticoagulant therapy 06/28/2010  . H/O mechanical aortic valve replacement 06/28/2010  . AAA 01/20/2010  . HEART VALVE REPLACEMENT, HX OF 12/31/2008  . CKD (chronic kidney disease) stage 3, GFR 30-59 ml/min (HCC)  08/06/2008  . Dementia 05/25/2007  . OSTEOARTHROSIS, GENERALIZED, MULTIPLE SITES 09/18/2006  . HYPERCHOLESTEROLEMIA 06/29/2006  . Gouty arthropathy 06/29/2006  . CARPAL TUNNEL SYNDROME 06/29/2006  . Essential hypertension 06/29/2006  . GASTROESOPHAGEAL REFLUX, NO ESOPHAGITIS 06/29/2006  . BPH (benign prostatic hyperplasia) 06/29/2006  . Skin cancer 06/29/2006  . BACK PAIN, LOW 06/29/2006  . ROTATOR CUFF TENDONITIS 06/29/2006  . TOBACCO USE, QUIT 06/29/2006    Orientation RESPIRATION BLADDER Height & Weight     Self, Time, Situation, Place  Normal Continent, External catheter Weight:   Height:     BEHAVIORAL SYMPTOMS/MOOD NEUROLOGICAL BOWEL NUTRITION STATUS      Incontinent Diet(see discharge summary)  AMBULATORY STATUS COMMUNICATION OF NEEDS Skin   Extensive Assist   Surgical wounds(neck incision; honeycomb dressing & aspen collar)                       Personal Care Assistance Level of Assistance  Bathing, Dressing, Feeding Bathing Assistance: Maximum assistance Feeding assistance: Independent Dressing Assistance: Maximum assistance     Functional Limitations Info  Sight, Hearing, Speech Sight Info: Adequate Hearing Info: Adequate Speech Info: Adequate    SPECIAL CARE FACTORS FREQUENCY  PT (By licensed PT), OT (By licensed OT)     PT Frequency: 5x week OT Frequency: 5x week            Contractures Contractures Info: Not present    Additional Factors Info  Code Status, Allergies Code Status Info: Full Code Allergies Info: No Known Allergies           Current Medications (07/13/2017):  This is the current hospital active medication list Current Facility-Administered  Medications  Medication Dose Route Frequency Provider Last Rate Last Dose  . 0.9 %  sodium chloride infusion   Intravenous Continuous Costella, Vincent J, PA-C      . 0.9 %  sodium chloride infusion  250 mL Intravenous Continuous Costella, Vista Mink, PA-C      . acetaminophen (TYLENOL)  tablet 650 mg  650 mg Oral Q4H PRN Traci Sermon, PA-C   650 mg at 07/12/17 2146   Or  . acetaminophen (TYLENOL) suppository 650 mg  650 mg Rectal Q4H PRN Costella, Vista Mink, PA-C      . bisacodyl (DULCOLAX) suppository 10 mg  10 mg Rectal Daily PRN Costella, Vista Mink, PA-C      . docusate sodium (COLACE) capsule 100 mg  100 mg Oral BID Costella, Vincent J, PA-C   100 mg at 07/13/17 0930  . gabapentin (NEURONTIN) capsule 300 mg  300 mg Oral TID Costella, Vincent J, PA-C   300 mg at 07/13/17 0930  . HYDROcodone-acetaminophen (NORCO/VICODIN) 5-325 MG per tablet 1 tablet  1 tablet Oral Q4H PRN Traci Sermon, PA-C   1 tablet at 07/12/17 0248  . HYDROmorphone (DILAUDID) injection 0.5-1 mg  0.5-1 mg Intravenous Q2H PRN Costella, Vista Mink, PA-C      . lactated ringers infusion   Intravenous Continuous Roderic Palau, MD 50 mL/hr at 07/11/17 1308    . menthol-cetylpyridinium (CEPACOL) lozenge 3 mg  1 lozenge Oral PRN Costella, Vista Mink, PA-C       Or  . phenol (CHLORASEPTIC) mouth spray 1 spray  1 spray Mouth/Throat PRN Costella, Vista Mink, PA-C      . methocarbamol (ROBAXIN) tablet 500 mg  500 mg Oral Q6H PRN Costella, Vista Mink, PA-C   500 mg at 07/12/17 2146   Or  . methocarbamol (ROBAXIN) 500 mg in dextrose 5 % 50 mL IVPB  500 mg Intravenous Q6H PRN Costella, Vincent J, PA-C      . ondansetron (ZOFRAN) tablet 4 mg  4 mg Oral Q6H PRN Costella, Vincent J, PA-C       Or  . ondansetron (ZOFRAN) injection 4 mg  4 mg Intravenous Q6H PRN Costella, Vincent J, PA-C      . oxyCODONE (Oxy IR/ROXICODONE) immediate release tablet 5-10 mg  5-10 mg Oral Q3H PRN Costella, Vincent J, PA-C   5 mg at 07/12/17 1954  . senna (SENOKOT) tablet 8.6 mg  1 tablet Oral BID Costella, Vincent J, PA-C   8.6 mg at 07/13/17 0930  . senna-docusate (Senokot-S) tablet 1 tablet  1 tablet Oral QHS PRN Costella, Vincent J, PA-C      . sodium chloride flush (NS) 0.9 % injection 3 mL  3 mL Intravenous Q12H Costella,  Vincent J, PA-C   3 mL at 07/13/17 0931  . sodium chloride flush (NS) 0.9 % injection 3 mL  3 mL Intravenous PRN Costella, Vista Mink, PA-C      . sodium phosphate (FLEET) 7-19 GM/118ML enema 1 enema  1 enema Rectal Once PRN Costella, Vista Mink, PA-C      . zolpidem (AMBIEN) tablet 5 mg  5 mg Oral QHS PRN Costella, Vista Mink, PA-C         Discharge Medications: Please see discharge summary for a list of discharge medications.  Relevant Imaging Results:  Relevant Lab Results:   Additional Information SS# [id number]; has aspen collar   [place], LCSWA

## 2017-07-14 ENCOUNTER — Other Ambulatory Visit: Payer: Self-pay

## 2017-07-14 DIAGNOSIS — M109 Gout, unspecified: Secondary | ICD-10-CM | POA: Diagnosis present

## 2017-07-14 DIAGNOSIS — Z85828 Personal history of other malignant neoplasm of skin: Secondary | ICD-10-CM | POA: Diagnosis not present

## 2017-07-14 DIAGNOSIS — E799 Disorder of purine and pyrimidine metabolism, unspecified: Secondary | ICD-10-CM | POA: Diagnosis not present

## 2017-07-14 DIAGNOSIS — Z6829 Body mass index (BMI) 29.0-29.9, adult: Secondary | ICD-10-CM | POA: Diagnosis not present

## 2017-07-14 DIAGNOSIS — A419 Sepsis, unspecified organism: Secondary | ICD-10-CM | POA: Diagnosis present

## 2017-07-14 DIAGNOSIS — N3001 Acute cystitis with hematuria: Secondary | ICD-10-CM | POA: Diagnosis not present

## 2017-07-14 DIAGNOSIS — M4322 Fusion of spine, cervical region: Secondary | ICD-10-CM | POA: Diagnosis not present

## 2017-07-14 DIAGNOSIS — Z96651 Presence of right artificial knee joint: Secondary | ICD-10-CM | POA: Diagnosis present

## 2017-07-14 DIAGNOSIS — I130 Hypertensive heart and chronic kidney disease with heart failure and stage 1 through stage 4 chronic kidney disease, or unspecified chronic kidney disease: Secondary | ICD-10-CM | POA: Diagnosis present

## 2017-07-14 DIAGNOSIS — E860 Dehydration: Secondary | ICD-10-CM | POA: Diagnosis present

## 2017-07-14 DIAGNOSIS — E7800 Pure hypercholesterolemia, unspecified: Secondary | ICD-10-CM | POA: Diagnosis present

## 2017-07-14 DIAGNOSIS — N400 Enlarged prostate without lower urinary tract symptoms: Secondary | ICD-10-CM | POA: Diagnosis present

## 2017-07-14 DIAGNOSIS — N183 Chronic kidney disease, stage 3 (moderate): Secondary | ICD-10-CM | POA: Diagnosis present

## 2017-07-14 DIAGNOSIS — R278 Other lack of coordination: Secondary | ICD-10-CM | POA: Diagnosis not present

## 2017-07-14 DIAGNOSIS — M6389 Disorders of muscle in diseases classified elsewhere, multiple sites: Secondary | ICD-10-CM | POA: Diagnosis not present

## 2017-07-14 DIAGNOSIS — G309 Alzheimer's disease, unspecified: Secondary | ICD-10-CM | POA: Diagnosis not present

## 2017-07-14 DIAGNOSIS — M545 Low back pain: Secondary | ICD-10-CM | POA: Diagnosis not present

## 2017-07-14 DIAGNOSIS — R791 Abnormal coagulation profile: Secondary | ICD-10-CM | POA: Diagnosis not present

## 2017-07-14 DIAGNOSIS — I2510 Atherosclerotic heart disease of native coronary artery without angina pectoris: Secondary | ICD-10-CM | POA: Diagnosis present

## 2017-07-14 DIAGNOSIS — A0472 Enterocolitis due to Clostridium difficile, not specified as recurrent: Secondary | ICD-10-CM | POA: Diagnosis present

## 2017-07-14 DIAGNOSIS — I509 Heart failure, unspecified: Secondary | ICD-10-CM | POA: Diagnosis not present

## 2017-07-14 DIAGNOSIS — R41841 Cognitive communication deficit: Secondary | ICD-10-CM | POA: Diagnosis not present

## 2017-07-14 DIAGNOSIS — N179 Acute kidney failure, unspecified: Secondary | ICD-10-CM | POA: Diagnosis not present

## 2017-07-14 DIAGNOSIS — R7309 Other abnormal glucose: Secondary | ICD-10-CM | POA: Diagnosis not present

## 2017-07-14 DIAGNOSIS — R079 Chest pain, unspecified: Secondary | ICD-10-CM | POA: Diagnosis not present

## 2017-07-14 DIAGNOSIS — E538 Deficiency of other specified B group vitamins: Secondary | ICD-10-CM | POA: Diagnosis not present

## 2017-07-14 DIAGNOSIS — F0280 Dementia in other diseases classified elsewhere without behavioral disturbance: Secondary | ICD-10-CM | POA: Diagnosis not present

## 2017-07-14 DIAGNOSIS — M542 Cervicalgia: Secondary | ICD-10-CM | POA: Diagnosis not present

## 2017-07-14 DIAGNOSIS — K219 Gastro-esophageal reflux disease without esophagitis: Secondary | ICD-10-CM | POA: Diagnosis present

## 2017-07-14 DIAGNOSIS — I739 Peripheral vascular disease, unspecified: Secondary | ICD-10-CM | POA: Diagnosis present

## 2017-07-14 DIAGNOSIS — N390 Urinary tract infection, site not specified: Secondary | ICD-10-CM | POA: Diagnosis not present

## 2017-07-14 DIAGNOSIS — I5032 Chronic diastolic (congestive) heart failure: Secondary | ICD-10-CM | POA: Diagnosis present

## 2017-07-14 DIAGNOSIS — I10 Essential (primary) hypertension: Secondary | ICD-10-CM | POA: Diagnosis not present

## 2017-07-14 DIAGNOSIS — Z981 Arthrodesis status: Secondary | ICD-10-CM | POA: Diagnosis not present

## 2017-07-14 DIAGNOSIS — M6281 Muscle weakness (generalized): Secondary | ICD-10-CM | POA: Diagnosis not present

## 2017-07-14 DIAGNOSIS — F0390 Unspecified dementia without behavioral disturbance: Secondary | ICD-10-CM | POA: Diagnosis not present

## 2017-07-14 DIAGNOSIS — R319 Hematuria, unspecified: Secondary | ICD-10-CM | POA: Diagnosis not present

## 2017-07-14 DIAGNOSIS — R2681 Unsteadiness on feet: Secondary | ICD-10-CM | POA: Diagnosis not present

## 2017-07-14 DIAGNOSIS — R531 Weakness: Secondary | ICD-10-CM | POA: Diagnosis not present

## 2017-07-14 DIAGNOSIS — G8929 Other chronic pain: Secondary | ICD-10-CM | POA: Diagnosis present

## 2017-07-14 DIAGNOSIS — Z952 Presence of prosthetic heart valve: Secondary | ICD-10-CM | POA: Diagnosis not present

## 2017-07-14 DIAGNOSIS — R031 Nonspecific low blood-pressure reading: Secondary | ICD-10-CM | POA: Diagnosis not present

## 2017-07-14 DIAGNOSIS — M4802 Spinal stenosis, cervical region: Secondary | ICD-10-CM | POA: Diagnosis not present

## 2017-07-14 DIAGNOSIS — M4712 Other spondylosis with myelopathy, cervical region: Secondary | ICD-10-CM | POA: Diagnosis not present

## 2017-07-14 DIAGNOSIS — Z87891 Personal history of nicotine dependence: Secondary | ICD-10-CM | POA: Diagnosis not present

## 2017-07-14 DIAGNOSIS — R410 Disorientation, unspecified: Secondary | ICD-10-CM | POA: Diagnosis not present

## 2017-07-14 DIAGNOSIS — E872 Acidosis: Secondary | ICD-10-CM | POA: Diagnosis present

## 2017-07-14 MED ORDER — HYDROCODONE-ACETAMINOPHEN 5-325 MG PO TABS: 1.0000 | ORAL_TABLET | ORAL | 0 refills | Status: DC | PRN

## 2017-07-14 NOTE — Social Work (Signed)
Clinical Social Worker facilitated patient discharge including contacting patient family and facility to confirm patient discharge plans.  Clinical information faxed to facility and family agreeable with plan.  CSW arranged ambulance transport via [facility] to [facility] to call [phone] with report  prior to discharge.  Clinical Social Worker will sign off for now as social work intervention is no longer needed. Please consult [place] again if new need arises.  Alexander Mt, [place] Social Worker

## 2017-07-14 NOTE — Progress Notes (Signed)
Subjective: Patient reports doing well feels like he is getting stronger  Objective: Vital signs in last 24 hours: Temp:  [97.5 F (36.4 C)-98.3 F (36.8 C)] 97.6 F (36.4 C) (03/15 0826) Pulse Rate:  [58-70] 58 (03/15 0800) Resp:  [16] 16 (03/15 0327) BP: (138-147)/(60-78) 147/60 (03/15 0800) SpO2:  [95 %-96 %] 95 % (03/15 0800)  Intake/Output from previous day: 03/14 0701 - 03/15 0700 In: 480 [P.O.:480] Out: 700 [Urine:700] Intake/Output this shift: Total I/O In: 240 [P.O.:240] Out: -   4+ out of 5 diffuse generalized weakness but improving incision clean dry and intact  Lab Results: Recent Labs    07/12/17 0207  WBC 15.3*  HGB 13.7  HCT 40.3  PLT 223   BMET Recent Labs    07/12/17 0207  NA 136  K 4.1  CL 103  CO2 21*  GLUCOSE 133*  BUN 25*  CREATININE 1.45*  CALCIUM 9.3    Studies/Results: No results found.  Assessment/Plan: Continue to mobilize with physical occupational therapy patient is stable for transfer to rehabilitation on bed becomes available.  LOS: 3 days     Silvino Selman P 07/14/2017, 10:12 AM

## 2017-07-14 NOTE — Progress Notes (Signed)
Physical Therapy Treatment Patient Details Name: Juan Li MRN: [id number] DOB: 1931/05/31 Today's Date: 07/14/2017    History of Present Illness 84 y.o. 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  Now s/p ACDF at C5-6.  PMH: HTN, PAD, CAD, s/p AVR, arthritis, memory loss, BPH, GERD.    PT Comments    Pt improving slowly.  Most of the session spent up in standing, practicing transfer safety and gait stability/stamina.    Follow Up Recommendations  SNF;Supervision/Assistance - 24 hour     Equipment Recommendations  None recommended by PT    Recommendations for Other Services       Precautions / Restrictions Precautions Precautions: Fall;Cervical Precaution Booklet Issued: Yes (comment) Required Braces or Orthoses: Cervical Brace Cervical Brace: Hard collar    Mobility  Bed Mobility Overal bed mobility: Needs Assistance             General bed mobility comments: up in the chair on arrival  Transfers Overall transfer level: Needs assistance   Transfers: Sit to/from Stand Sit to Stand: Mod assist;+2 physical assistance         General transfer comment: cues for hand placement and assist to come forward and boost.  Ambulation/Gait Ambulation/Gait assistance: Mod assist;+2 physical assistance Ambulation Distance (Feet): 34 Feet(24 and 10 feet, respectively) Assistive device: Rolling walker (2 wheeled) Gait Pattern/deviations: Step-through pattern   Gait velocity interpretation: Below normal speed for age/gender General Gait Details: short, weak-kneed gait with flexed posture and tendency to sag toward the floor getting further from the RW unless controlled externally.  cued for approp technique, assist to keep upright.   Stairs            Wheelchair Mobility    Modified Rankin (Stroke Patients Only)       Balance Overall balance assessment: Needs assistance   Sitting  balance-Leahy Scale: Fair Sitting balance - Comments: slumped at rest, but can sit up erect, making the movement without UE assist   Standing balance support: Bilateral upper extremity supported Standing balance-Leahy Scale: Poor Standing balance comment: reliant on external support and RW for assist                            Cognition Arousal/Alertness: Awake/alert Behavior During Therapy: WFL for tasks assessed/performed Overall Cognitive Status: Within Functional Limits for tasks assessed                                        Exercises      General Comments General comments (skin integrity, edema, etc.): Reinforced neck care/prec, log roll, expected progression in rehab.      Pertinent Vitals/Pain Pain Assessment: Faces Faces Pain Scale: Hurts a little bit Pain Location: neck Pain Descriptors / Indicators: Discomfort Pain Intervention(s): Monitored during session    Home Living                      Prior Function            PT Goals (current goals can now be found in the care plan section) Acute Rehab PT Goals Patient Stated Goal: walk better PT Goal Formulation: With patient Time For Goal Achievement: 07/26/17 Potential to Achieve Goals: Fair Progress towards PT goals: Progressing toward goals    Frequency  Min 5X/week      PT Plan Current plan remains appropriate    Co-evaluation              AM-PAC PT "6 Clicks" Daily Activity  Outcome Measure  Difficulty turning over in bed (including adjusting bedclothes, sheets and blankets)?: Unable Difficulty moving from lying on back to sitting on the side of the bed? : Unable Difficulty sitting down on and standing up from a chair with arms (e.g., wheelchair, bedside commode, etc,.)?: Unable Help needed moving to and from a bed to chair (including a wheelchair)?: A Lot Help needed walking in hospital room?: A Lot Help needed climbing 3-5 steps with a railing? :  Total 6 Click Score: 8    End of Session Equipment Utilized During Treatment: Cervical collar Activity Tolerance: Patient limited by fatigue Patient left: with call bell/phone within reach;in chair;with chair alarm set Nurse Communication: Mobility status PT Visit Diagnosis: Other abnormalities of gait and mobility (R26.89);Muscle weakness (generalized) (M62.81);History of falling (Z91.81);Other symptoms and signs involving the nervous system (R29.898)     Time: 5732-2025 PT Time Calculation (min) (ACUTE ONLY): 28 min  Charges:  $Gait Training: 8-22 mins $Therapeutic Activity: 8-22 mins                    G Codes:       07/28/17  Donnella Sham, PT [phone]  (pager)   Tessie Fass Sandeep Delagarza Jul 28, 2017, 12:38 PM

## 2017-07-14 NOTE — Clinical Social Work Placement (Signed)
   CLINICAL SOCIAL WORK PLACEMENT  NOTE [place] Room 211 RN to call report to [phone]  Date:  07/14/2017  Patient Details  Name: Juan Li MRN: [id number] Date of Birth: 08/23/1931  Clinical Social Work is seeking post-discharge placement for this patient at the [place] level of care (*CSW will initial, date and re-position this form in  chart as items are completed):  Yes   Patient/family provided with [place] Work Department's list of facilities offering this level of care within the geographic area requested by the patient (or if unable, by the patient's family).  Yes   Patient/family informed of their freedom to choose among providers that offer the needed level of care, that participate in Medicare, Medicaid or managed care program needed by the patient, have an available bed and are willing to accept the patient.  Yes   Patient/family informed of [facility]'s ownership interest in [facility] and [facility], as well as of the fact that they are under no obligation to receive care at these facilities.  PASRR submitted to EDS on       PASRR number received on       Existing PASRR number confirmed on 07/13/17     FL2 transmitted to all facilities in geographic area requested by pt/family on 07/13/17     FL2 transmitted to all facilities within larger geographic area on       Patient informed that his/her managed care company has contracts with or will negotiate with certain facilities, including the following:        Yes   Patient/family informed of bed offers received.  Patient chooses bed at [facility], [place]     Physician recommends and patient chooses bed at      Patient to be transferred to [facility] on 07/14/17.  Patient to be transferred to facility by [facility]     Patient family notified on 07/14/17 of transfer.  Name of family member notified:  [name], wife      PHYSICIAN Please prepare prescriptions     Additional Comment:    _______________________________________________ Alexander Mt, LCSWA 07/14/2017, 4:08 PM

## 2017-07-14 NOTE — Care Management Important Message (Signed)
Important Message  Patient Details  Name: Juan Li MRN: [id number] Date of Birth: [birth year]   Medicare Important Message Given:  Yes    [name] [name] 07/14/2017, 11:25 AM

## 2017-07-14 NOTE — Discharge Summary (Signed)
Physician Discharge Summary  Patient ID: Juan Li MRN: [id number] DOB/AGE: 1931/10/23 84 y.o.  Admit date: 07/11/2017 Discharge date: 07/14/2017  Admission Diagnoses: Cervical stenosis with myelopathy, C5-6    Discharge Diagnoses: same   Discharged Condition: good  Hospital Course: The patient was admitted on 07/11/2017 and taken to the operating room where the patient underwent C5-6 discectomy. The patient tolerated the procedure well and was taken to the recovery room and then to the floor in stable condition. The hospital course was routine. There were no complications. The wound remained clean dry and intact. Pt had appropriate neck soreness. No complaints of new pain or new N/T/W. The patient remained afebrile with stable vital signs, and tolerated a regular diet. The patient continued to increase activities, and pain was well controlled with oral pain medications.   Consults: None  Significant Diagnostic Studies:  Results for orders placed or performed during the hospital encounter of 07/11/17  PT- INR Day of Surgery  Result Value Ref Range   Prothrombin Time 25.2 (H) 11.4 - 15.2 seconds   INR 2.32   CBC  Result Value Ref Range   WBC 15.3 (H) 4.0 - 10.5 K/uL   RBC 4.35 4.22 - 5.81 MIL/uL   Hemoglobin 13.7 13.0 - 17.0 g/dL   HCT 40.3 39.0 - 52.0 %   MCV 92.6 78.0 - 100.0 fL   MCH 31.5 26.0 - 34.0 pg   MCHC 34.0 30.0 - 36.0 g/dL   RDW 14.5 11.5 - 15.5 %   Platelets 223 150 - 400 K/uL  Basic Metabolic Panel  Result Value Ref Range   Sodium 136 135 - 145 mmol/L   Potassium 4.1 3.5 - 5.1 mmol/L   Chloride 103 101 - 111 mmol/L   CO2 21 (L) 22 - 32 mmol/L   Glucose, Bld 133 (H) 65 - 99 mg/dL   BUN 25 (H) 6 - 20 mg/dL   Creatinine, Ser 1.45 (H) 0.61 - 1.24 mg/dL   Calcium 9.3 8.9 - 10.3 mg/dL   GFR calc non Af Amer 43 (L) >60 mL/min   GFR calc Af Amer 50 (L) >60 mL/min   Anion gap 12 5 - 15  Protime-INR  Result Value Ref Range   Prothrombin Time 25.4 (H) 11.4 -  15.2 seconds   INR 2.33   APTT  Result Value Ref Range   aPTT 42 (H) 24 - 36 seconds    Dg Cervical Spine 1 View  Result Date: 07/11/2017 CLINICAL DATA:  Intraoperative views from ACDF at C5-6. EXAM: DG C-ARM 61-120 MIN; DG CERVICAL SPINE - 1 VIEW COMPARISON:  Prior MRI from 02/16/2017 FINDINGS: Spot lateral intraoperative fluoroscopic view from ACDF at C5-6. Hardware appears normally position. No complication. IMPRESSION: Intraoperative fluoroscopic views for ACDF at C5-6. Electronically Signed   By: Jeannine Boga M.D.   On: 07/11/2017 17:15   Ct Head Wo Contrast  Result Date: 06/23/2017 CLINICAL DATA:  84 year old with fall and dizziness. Patient is on Coumadin. EXAM: CT HEAD WITHOUT CONTRAST TECHNIQUE: Contiguous axial images were obtained from the base of the skull through the vertex without intravenous contrast. COMPARISON:  Brain MRI 07/02/2014 FINDINGS: Brain: No evidence for acute hemorrhage, mass lesion, midline shift or large new infarct. Again noted is enlargement of ventricles that appears to be secondary to cerebral atrophy. 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 Low-density in the white matter compatible with chronic changes. Vascular: No hyperdense vessel or unexpected calcification. Skull: Normal. Negative for fracture or  focal lesion. Sinuses/Orbits: Visualized paranasal sinuses are clear. There is mild mucosal thickening in the ethmoid air cells. Other: None. IMPRESSION: No acute intracranial abnormality. Stable atrophy and evidence for chronic small vessel ischemic changes. Electronically Signed   By: Markus Daft M.D.   On: 06/23/2017 13:40   Dg C-arm 1-60 Min  Result Date: 07/11/2017 CLINICAL DATA:  Intraoperative views from ACDF at C5-6. EXAM: DG C-ARM 61-120 MIN; DG CERVICAL SPINE - 1 VIEW COMPARISON:  Prior MRI from 02/16/2017 FINDINGS: Spot lateral intraoperative fluoroscopic view from ACDF at C5-6. Hardware appears normally position. No  complication. IMPRESSION: Intraoperative fluoroscopic views for ACDF at C5-6. Electronically Signed   By: Jeannine Boga M.D.   On: 07/11/2017 17:15   Dg C-arm 1-60 Min  Result Date: 07/11/2017 CLINICAL DATA:  Intraoperative views from ACDF at C5-6. EXAM: DG C-ARM 61-120 MIN; DG CERVICAL SPINE - 1 VIEW COMPARISON:  Prior MRI from 02/16/2017 FINDINGS: Spot lateral intraoperative fluoroscopic view from ACDF at C5-6. Hardware appears normally position. No complication. IMPRESSION: Intraoperative fluoroscopic views for ACDF at C5-6. Electronically Signed   By: Jeannine Boga M.D.   On: 07/11/2017 17:15    Antibiotics:  Anti-infectives (From admission, onward)   Start     Dose/Rate Route Frequency Ordered Stop   07/12/17 0600  ceFAZolin (ANCEF) IVPB 2g/100 mL premix     2 g 200 mL/hr over 30 Minutes Intravenous On call to O.R. 07/11/17 1207 07/11/17 1422   07/11/17 2200  ceFAZolin (ANCEF) IVPB 2g/100 mL premix     2 g 200 mL/hr over 30 Minutes Intravenous Every 8 hours 07/11/17 1618 07/12/17 0643   07/11/17 1333  bacitracin 50,000 Units in sodium chloride irrigation 0.9 % 500 mL irrigation  Status:  Discontinued       As needed 07/11/17 1444 07/11/17 1610      Discharge Exam: Blood pressure (!) 144/61, pulse 68, temperature 97.6 F (36.4 C), temperature source Oral, resp. rate 16, SpO2 100 %. Neurologic: Grossly normal Voiding well  Discharge Medications:   Allergies as of 07/14/2017   No Known Allergies     Medication List    TAKE these medications   amLODipine 10 MG tablet Commonly known as:  NORVASC TAKE 1 TABLET BY MOUTH EVERY DAY   aspirin EC 81 MG tablet Take 81 mg by mouth every morning.   colchicine 0.6 MG tablet Take 1 tablet by mouth two times a day for one day, then 1 tablet daily for one week. What changed:    how much to take  how to take this  when to take this  reasons to take this  additional instructions   donepezil 5 MG tablet Commonly  known as:  ARICEPT Take 1 tablet (5 mg total) by mouth daily. What changed:  when to take this   enoxaparin 80 MG/0.8ML injection Commonly known as:  LOVENOX Inject 0.8 mLs (80 mg total) into the skin every 12 (twelve) hours.   finasteride 5 MG tablet Commonly known as:  PROSCAR TAKE 1 TABLET (5 MG TOTAL) BY MOUTH DAILY.   hydrochlorothiazide 25 MG tablet Commonly known as:  HYDRODIURIL Take 25 mg daily by mouth.   HYDROcodone-acetaminophen 10-325 MG tablet Commonly known as:  NORCO Take 1 tablet by mouth every 6 (six) hours as needed for moderate pain or severe pain. What changed:  Another medication with the same name was added. Make sure you understand how and when to take each.   HYDROcodone-acetaminophen 5-325 MG tablet Commonly  known as:  NORCO/VICODIN Take 1 tablet by mouth every 4 (four) hours as needed for moderate pain ((score 4 to 6)). What changed:  You were already taking a medication with the same name, and this prescription was added. Make sure you understand how and when to take each.   memantine 5 MG tablet Commonly known as:  NAMENDA Take 1 tablet (5 mg total) by mouth 2 (two) times daily.   mirabegron ER 50 MG Tb24 tablet Commonly known as:  MYRBETRIQ Take 1 tablet (50 mg total) by mouth daily.   potassium chloride SA 20 MEQ tablet Commonly known as:  K-DUR,KLOR-CON Take 20 mEq by mouth every other day.   pravastatin 40 MG tablet Commonly known as:  PRAVACHOL Take 1 tablet (40 mg total) by mouth every evening. What changed:  when to take this   riluzole 50 MG tablet Commonly known as:  RILUTEK Take 50 mg by mouth every 12 (twelve) hours. To take 2 weeks before and 2 weeks after procedure - July 07, 2017   tamsulosin 0.4 MG Caps capsule Commonly known as:  FLOMAX TAKE ONE CAPSULE BY MOUTH EVERY DAY   VASOTEC 20 MG tablet Generic drug:  enalapril Take 1 tablet (20 mg total) by mouth 2 (two) times daily. Pt will take 2 of his 10 mg tablets two  times a day and use current supply   Vitamin D (Ergocalciferol) 50000 units Caps capsule Commonly known as:  DRISDOL Take 50,000 Units by mouth every Wednesday.   warfarin 5 MG tablet Commonly known as:  COUMADIN Take as directed. If you are unsure how to take this medication, talk to your nurse or doctor. Original instructions:  TAKE 1 TABLET AS DIRECTED What changed:    how much to take  how to take this  when to take this  additional instructions       Disposition: snf   Final Dx: same  Discharge Instructions    Call MD for:  difficulty breathing, headache or visual disturbances   Complete by:  As directed    Call MD for:  extreme fatigue   Complete by:  As directed    Call MD for:  hives   Complete by:  As directed    Call MD for:  persistant dizziness or light-headedness   Complete by:  As directed    Call MD for:  persistant nausea and vomiting   Complete by:  As directed    Call MD for:  redness, tenderness, or signs of infection (pain, swelling, redness, odor or green/yellow discharge around incision site)   Complete by:  As directed    Call MD for:  severe uncontrolled pain   Complete by:  As directed    Diet - low sodium heart healthy   Complete by:  As directed    Increase activity slowly   Complete by:  As directed       Contact information for after-discharge care    Destination    [facility] .   Service:  Skilled Nursing Contact information: [place] [phone]               Signed: Ocie Cornfield [facility] 07/14/2017, 3:13 PM

## 2017-07-14 NOTE — Social Work (Signed)
CSW aware pt choice is for [facility], pt able to be transferred today or tomorrow.  Will call and confirm with wife,.  12:53- Pt wife in agreement for [facility]. CSW has called and left message at Neurosurgery Main Office.   CSW continuing to follow.  Alexander Mt, [place] Work [phone]

## 2017-07-17 ENCOUNTER — Telehealth: Payer: Self-pay | Admitting: *Deleted

## 2017-07-17 NOTE — Telephone Encounter (Signed)
Spoke with Juan Li nurse at [facility] and she states they are checking and managing Coumadin and Lovenox for pt currently while he is admitted there.

## 2017-07-19 DIAGNOSIS — M542 Cervicalgia: Secondary | ICD-10-CM | POA: Diagnosis not present

## 2017-07-19 DIAGNOSIS — I10 Essential (primary) hypertension: Secondary | ICD-10-CM | POA: Diagnosis not present

## 2017-07-19 DIAGNOSIS — Z6829 Body mass index (BMI) 29.0-29.9, adult: Secondary | ICD-10-CM | POA: Diagnosis not present

## 2017-07-19 DIAGNOSIS — M4712 Other spondylosis with myelopathy, cervical region: Secondary | ICD-10-CM | POA: Diagnosis not present

## 2017-07-27 ENCOUNTER — Ambulatory Visit (INDEPENDENT_AMBULATORY_CARE_PROVIDER_SITE_OTHER): Payer: Medicare Other | Admitting: Neurology

## 2017-07-27 ENCOUNTER — Encounter: Payer: Self-pay | Admitting: Neurology

## 2017-07-27 ENCOUNTER — Telehealth: Payer: Self-pay | Admitting: Neurology

## 2017-07-27 VITALS — BP 93/57 | HR 79

## 2017-07-27 DIAGNOSIS — M6281 Muscle weakness (generalized): Secondary | ICD-10-CM

## 2017-07-27 DIAGNOSIS — R5383 Other fatigue: Secondary | ICD-10-CM | POA: Insufficient documentation

## 2017-07-27 DIAGNOSIS — R410 Disorientation, unspecified: Secondary | ICD-10-CM | POA: Diagnosis not present

## 2017-07-27 DIAGNOSIS — R531 Weakness: Secondary | ICD-10-CM

## 2017-07-27 NOTE — Telephone Encounter (Signed)
medicare order sent to [facility] no auth they will contact the pt to schedule.

## 2017-07-27 NOTE — Progress Notes (Signed)
PATIENT: Juan Li DOB: [birth year]  Chief Complaint  Patient presents with  . New Patient (Initial Visit)    PCP: Dr. Leanna Battles. Pt recently had back surgery. Pt now having worsening weakness in legs.      HISTORICAL  Juan Li is a 84 year old male,  seen in refer by space his primary care doctor  Leanna Battles, for evaluation of weakness, initial evaluation was on July 27, 2017, he is brought in by his facility staff at [facility], patient is confused, provide limited history.  I was able to review multiple previous hospital admission note, he had a history of congestive heart failure, mechanical aortic valve 1993, on chronic Coumadin treatment, peripheral vascular disease, coronary artery disease, hypertension, hyperlipidemia, former smoker, quit in 1997, right total knee replacement in October 2013, he also has a history of dementia, taking Namenda, and Aricept,  He had vasectomy at C5-6 with decompression of cervical stenosis with myelopathy, by Dr. Kathyrn Sheriff on July 11, 2017,  Per patient, he lives at home still driving on Christmas of 2018, ambulate with a cane prior to hospital, he came in with wheelchair today, reported not feeling well, increased confusion, no longer ambulatory, but the patient is a poor historian, unable to provide limited information, he could not spell his wife's name, is not oriented to year, and months.  I reviewed MRI cervical in October 2018, prior to his recent decompression surgery, progressive ankylosis at C5-6, flattening of the cord, severe bilateral foraminal narrowing,  Laboratory evaluation showed elevated INR 2.33 in July 12, 2017,  Patient is on polypharmacy treatment, including Lovenox 80 mg subcutaneous twice a day, Coumadin 5 mg daily, aspirin 81 mg daily,  Our office was able to talk with the staff at the [facility], per conversation, his primary care physician Dr. Sharlett Iles will have repeat INR soon, if INR is  within therapeutic range, Lovenox will be stopped.  MRI of the brain 2016 shows significant atrophy, ventricular megaly, supratentorium small vessel disease, cerebellum chronic stroke  REVIEW OF SYSTEMS: Full 14 system review of systems performed and notable only for weakness, confusion  ALLERGIES: No Known Allergies  HOME MEDICATIONS: Current Outpatient Medications  Medication Sig Dispense Refill  . amLODipine (NORVASC) 10 MG tablet TAKE 1 TABLET BY MOUTH EVERY DAY 30 tablet 10  . aspirin EC 81 MG tablet Take 81 mg by mouth every morning.     . colchicine 0.6 MG tablet Take 1 tablet by mouth two times a day for one day, then 1 tablet daily for one week. (Patient taking differently: Take 0.6 mg by mouth daily as needed (gout). Take 1 tablet by mouth two times a day for one day, then 1 tablet daily for one week.) 30 tablet 3  . donepezil (ARICEPT) 5 MG tablet Take 1 tablet (5 mg total) by mouth daily. (Patient taking differently: Take 5 mg by mouth every morning. ) 90 tablet 3  . enalapril (VASOTEC) 20 MG tablet Take 1 tablet (20 mg total) by mouth 2 (two) times daily. Pt will take 2 of his 10 mg tablets two times a day and use current supply    . enoxaparin (LOVENOX) 80 MG/0.8ML injection Inject 0.8 mLs (80 mg total) into the skin every 12 (twelve) hours. 20 Syringe 1  . finasteride (PROSCAR) 5 MG tablet TAKE 1 TABLET (5 MG TOTAL) BY MOUTH DAILY. 90 tablet 3  . hydrochlorothiazide (HYDRODIURIL) 25 MG tablet Take 25 mg daily by mouth.    Marland Kitchen  memantine (NAMENDA) 5 MG tablet Take 1 tablet (5 mg total) by mouth 2 (two) times daily. 180 tablet 3  . mirabegron ER (MYRBETRIQ) 50 MG TB24 tablet Take 1 tablet (50 mg total) by mouth daily. 30 tablet   . potassium chloride SA (K-DUR,KLOR-CON) 20 MEQ tablet Take 20 mEq by mouth every other day.    . pravastatin (PRAVACHOL) 40 MG tablet Take 1 tablet (40 mg total) by mouth every evening. (Patient taking differently: Take 40 mg by mouth 3 (three) times a  week. ) 90 tablet 3  . tamsulosin (FLOMAX) 0.4 MG CAPS capsule TAKE ONE CAPSULE BY MOUTH EVERY DAY 90 capsule 3  . vancomycin (VANCOCIN) 250 MG capsule Take 250 mg by mouth 4 (four) times daily.    . Vitamin D, Ergocalciferol, (DRISDOL) 50000 units CAPS capsule Take 50,000 Units by mouth every Wednesday.    . warfarin (COUMADIN) 5 MG tablet TAKE 1 TABLET AS DIRECTED (Patient taking differently: Take 2.5-5 mg by mouth See admin instructions. TAKE 1 TABLET AS DIRECTED - 5 mg all nights, except 2.5 mg Sunday and Thursday) 100 tablet 3  . riluzole (RILUTEK) 50 MG tablet Take 50 mg by mouth every 12 (twelve) hours. To take 2 weeks before and 2 weeks after procedure - July 07, 2017     No current facility-administered medications for this visit.     PAST MEDICAL HISTORY: Past Medical History:  Diagnosis Date  . Arthritis 02-15-12   osteoarthritis,right knee, pain right wrist.,  . Arthritis    "hands, legs" (12/01/2016)  . Carotid artery disease ([place])    Carotid US 1/18: R 40-59; L 80-99  . Chronic lower back pain   . Complication of anesthesia 02-15-12   very confused, and very slow to awaken after anesthesia  . Coronary artery disease 02-15-12   Heart valve replaced   . Depression   . Hypercholesterolemia 02-15-12  . Hypertension   . Memory loss   . PAD (peripheral artery disease) ([place])   . S/P AVR (aortic valve replacement)    Mechanical [employer] AVR // Echo 12/17: Moderate LVH, EF 60-65, normal wall motion, grade 1 diastolic dysfunction, mechanical AVR okay with mean gradient 8 mmHg, MAC, mild mitral stenosis (mean gradient 2 mmHg), mild MR, mild RAE, PASP 25  . Urinary frequency     PAST SURGICAL HISTORY: Past Surgical History:  Procedure Laterality Date  . ABDOMINAL AORTAGRAM  12/01/2016  . ABDOMINAL AORTOGRAM N/A 12/01/2016   Procedure: ABDOMINAL AORTOGRAM;  Surgeon: Nelva Bush, MD;  Location: [facility];  Service: Cardiovascular;  Laterality: N/A;  . ANTERIOR  CERVICAL DECOMP/DISCECTOMY FUSION       Decompression at the level of C3-C4 and C4-C5, allograft and plate from C3 to C%, microscope/notes 09/14/2010  . ANTERIOR CERVICAL DECOMP/DISCECTOMY FUSION N/A 07/11/2017   Procedure: CERVICAL FIVE-SIX ANTERIOR CERVICAL DECOMPRESSION/DISCECTOMY FUSION AND PLATE FIXATION;  Surgeon: Consuella Lose, MD;  Location: [facility];  Service: Neurosurgery;  Laterality: N/A;  CERVICAL FIVE-SIX ANTERIOR CERVICAL DECOMPRESSION/DISCECTOMY FUSION AND PLATE FIXATION  . BACK SURGERY    . CARDIAC VALVE REPLACEMENT    . CARPAL TUNNEL RELEASE Left 01/2003   Archie Endo 09/14/2010  . JOINT REPLACEMENT    . LOWER EXTREMITY ANGIOGRAPHY  12/01/2016  . LOWER EXTREMITY ANGIOGRAPHY Bilateral 12/01/2016   Procedure: Lower Extremity Angiography;  Surgeon: Nelva Bush, MD;  Location: [facility];  Service: Cardiovascular;  Laterality: Bilateral;  . MECHANICAL AORTIC VALVE REPLACEMENT  1993   Archie Endo 11/18/2016  .  SHOULDER OPEN ROTATOR CUFF REPAIR Left 02/15/2012  . TOTAL KNEE ARTHROPLASTY  02/20/2012   Procedure: TOTAL KNEE ARTHROPLASTY;  Surgeon: Gearlean Alf, MD;  Location: [facility];  Service: Orthopedics;  Laterality: Right;  . TRANSURETHRAL RESECTION OF PROSTATE  02/01/2010   Archie Endo 02/06/2010    FAMILY HISTORY: Family History  Problem Relation Age of Onset  . Heart disease Mother   . Hyperlipidemia Mother   . Hypertension Mother   . Varicose Veins Mother   . Hyperlipidemia Father   . Hypertension Father   . Heart attack Father     SOCIAL HISTORY:  Social History   Socioeconomic History  . Marital status: Married    Spouse name: Juan Li  . Number of children: 3  . Years of education: Not on file  . Highest education level: Not on file  Occupational History  . 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 Financial resource strain: Not on file  . Food insecurity:    Worry: Not on file    Inability: Not on file  .  Transportation needs:    Medical: Not on file    Non-medical: Not on file  Tobacco Use  . Smoking status: Former Smoker    Packs/day: 1.00    Years: 50.00    Pack years: 50.00    Types: Cigarettes    Last attempt to quit: 08/10/1995    Years since quitting: 21.9  . Smokeless tobacco: Never Used  Substance and Sexual Activity  . Alcohol use: No    Alcohol/week: 0.0 oz    Frequency: Never    Comment: 12/01/2016 "nothing in the 2000s"  . Drug use: No  . Sexual activity: Not on file  Lifestyle  . Physical activity:    Days per week: Not on file    Minutes per session: Not on file  . Stress: Not on file  Relationships  . Social connections:    Talks on phone: Not on file    Gets together: Not on file    Attends religious service: Not on file    Active member of club or organization: Not on file    Attends meetings of clubs or organizations: Not on file    Relationship status: Not on file  . Intimate partner violence:    Fear of current or ex partner: Not on file    Emotionally abused: Not on file    Physically abused: Not on file    Forced sexual activity: Not on file  Other Topics Concern  . Not on file  Social History Narrative   Health Care POA: Wife, Juan Li   Emergency Contact: wife, Juan Li, [phone] (c)   End of Life Plan:   Who lives with you: Patient lives with wife.   Any pets:    Diet: Patient has a varied diet of protein, starch, and vegetables.   Exercise: Patient does not have a regular exercise plan, afraid of falling.   Seatbelts: Patient reports wearing seatbelt when in vehicle.    Sun Exposure/Protection: Patient does not wear sun protection.   Hobbies: Patient likes to do yard work.           PHYSICAL EXAM   Vitals:   07/27/17 1055  BP: (!) 93/57  Pulse: 79    Not recorded      There is no height or weight on file to calculate BMI.  PHYSICAL EXAMNIATION:  Gen: NAD, conversant, well  nourised, obese, well groomed                      Cardiovascular: Regular rate rhythm, no peripheral edema, warm, nontender. Eyes: Conjunctivae clear without exudates or hemorrhage Neck: Supple, no carotid bruits. Pulmonary: Clear to auscultation bilaterally   NEUROLOGICAL EXAM:  MENTAL STATUS: Patient wears a rigid cervical collar, sitting in a wheelchair, seems quite uncomfortable, he  answers questions, but not oriented to year, month, place,   CRANIAL NERVES: CN II: Visual fields are full to confrontation.  Pupils were small, round, reactive to light CN III, IV, VI: extraocular movement are normal. No ptosis. CN V: Facial sensation is intact to pinprick in all 3 divisions bilaterally. Corneal responses are intact.  CN VII: Face is symmetric with normal eye closure and smile. CN VIII: Hearing is normal to rubbing fingers CN IX, X: Palate elevates symmetrically. Phonation is normal. CN XI: Head turning and shoulder shrug are intact CN XII: Tongue is midline with normal movements and no atrophy.  MOTOR: Upper extremity motor strength is normal, there is bilateral lower extremity antigravity without any difficulty, no significant ankle weakness noticed,  REFLEXES: Reflexes are hypoactive and symmetric at the biceps, triceps, knees, and ankles. Plantar responses are flexor.  SENSORY: Not reliable, seems to have less dependent sensory changes to pinprick  COORDINATION: No limb dysmetria noticed.  GAIT/STANCE: deferred Romberg is absent.   DIAGNOSTIC DATA (LABS, IMAGING, TESTING) - I reviewed patient records, labs, notes, testing and imaging myself where available.   ASSESSMENT AND PLAN  JAQUAVIOUS MERCER is a 84 y.o. male   Status post anterior cervical C5-6 decompression and fusion on July 11, 2017 Worsening confusion, gait abnormality  This happened in the setting of polypharmacy, dementia, chronic Coumadin treatment history of aortic valve replacement, also Lovenox 80 mg twice a day,  aspirin,  Laboratory evaluations, including TSH, INR, CBC to infectious etiology  MRI of the brain, and cervical spine  If patient continued to worsen, may consider urgent evaluation  Marcial Pacas, M.D. Ph.D.  [facility] [address] [place], [place] [address] Ph: [phone] Fax: [phone]  CC: Leanna Battles, MD

## 2017-07-28 ENCOUNTER — Emergency Department (HOSPITAL_COMMUNITY): Payer: Medicare Other

## 2017-07-28 ENCOUNTER — Encounter (HOSPITAL_COMMUNITY): Payer: Self-pay | Admitting: Pharmacy Technician

## 2017-07-28 ENCOUNTER — Inpatient Hospital Stay (HOSPITAL_COMMUNITY)
Admission: EM | Admit: 2017-07-28 | Discharge: 2017-08-01 | DRG: 872 | Disposition: A | Discharge status: Skilled Nursing Facility | Payer: Medicare Other | Attending: Family Medicine | Admitting: Family Medicine

## 2017-07-28 ENCOUNTER — Telehealth: Payer: Self-pay | Admitting: Neurology

## 2017-07-28 DIAGNOSIS — Z952 Presence of prosthetic heart valve: Secondary | ICD-10-CM

## 2017-07-28 DIAGNOSIS — I739 Peripheral vascular disease, unspecified: Secondary | ICD-10-CM | POA: Diagnosis present

## 2017-07-28 DIAGNOSIS — Z87891 Personal history of nicotine dependence: Secondary | ICD-10-CM | POA: Diagnosis not present

## 2017-07-28 DIAGNOSIS — Z85828 Personal history of other malignant neoplasm of skin: Secondary | ICD-10-CM

## 2017-07-28 DIAGNOSIS — G309 Alzheimer's disease, unspecified: Secondary | ICD-10-CM | POA: Diagnosis not present

## 2017-07-28 DIAGNOSIS — N183 Chronic kidney disease, stage 3 (moderate): Secondary | ICD-10-CM | POA: Diagnosis present

## 2017-07-28 DIAGNOSIS — I130 Hypertensive heart and chronic kidney disease with heart failure and stage 1 through stage 4 chronic kidney disease, or unspecified chronic kidney disease: Secondary | ICD-10-CM | POA: Diagnosis present

## 2017-07-28 DIAGNOSIS — Z79899 Other long term (current) drug therapy: Secondary | ICD-10-CM

## 2017-07-28 DIAGNOSIS — Z96651 Presence of right artificial knee joint: Secondary | ICD-10-CM | POA: Diagnosis present

## 2017-07-28 DIAGNOSIS — E7800 Pure hypercholesterolemia, unspecified: Secondary | ICD-10-CM | POA: Diagnosis present

## 2017-07-28 DIAGNOSIS — R031 Nonspecific low blood-pressure reading: Secondary | ICD-10-CM | POA: Diagnosis not present

## 2017-07-28 DIAGNOSIS — M109 Gout, unspecified: Secondary | ICD-10-CM | POA: Diagnosis present

## 2017-07-28 DIAGNOSIS — A0472 Enterocolitis due to Clostridium difficile, not specified as recurrent: Secondary | ICD-10-CM | POA: Diagnosis present

## 2017-07-28 DIAGNOSIS — G8929 Other chronic pain: Secondary | ICD-10-CM | POA: Diagnosis present

## 2017-07-28 DIAGNOSIS — Z981 Arthrodesis status: Secondary | ICD-10-CM | POA: Diagnosis not present

## 2017-07-28 DIAGNOSIS — E860 Dehydration: Secondary | ICD-10-CM | POA: Diagnosis present

## 2017-07-28 DIAGNOSIS — K219 Gastro-esophageal reflux disease without esophagitis: Secondary | ICD-10-CM | POA: Diagnosis present

## 2017-07-28 DIAGNOSIS — N400 Enlarged prostate without lower urinary tract symptoms: Secondary | ICD-10-CM | POA: Diagnosis present

## 2017-07-28 DIAGNOSIS — M6389 Disorders of muscle in diseases classified elsewhere, multiple sites: Secondary | ICD-10-CM | POA: Diagnosis not present

## 2017-07-28 DIAGNOSIS — N3001 Acute cystitis with hematuria: Secondary | ICD-10-CM | POA: Diagnosis not present

## 2017-07-28 DIAGNOSIS — I5032 Chronic diastolic (congestive) heart failure: Secondary | ICD-10-CM | POA: Diagnosis present

## 2017-07-28 DIAGNOSIS — R2681 Unsteadiness on feet: Secondary | ICD-10-CM | POA: Diagnosis not present

## 2017-07-28 DIAGNOSIS — R319 Hematuria, unspecified: Secondary | ICD-10-CM

## 2017-07-28 DIAGNOSIS — R4182 Altered mental status, unspecified: Secondary | ICD-10-CM | POA: Diagnosis not present

## 2017-07-28 DIAGNOSIS — B9620 Unspecified Escherichia coli [E. coli] as the cause of diseases classified elsewhere: Secondary | ICD-10-CM | POA: Diagnosis present

## 2017-07-28 DIAGNOSIS — R278 Other lack of coordination: Secondary | ICD-10-CM | POA: Diagnosis not present

## 2017-07-28 DIAGNOSIS — R41841 Cognitive communication deficit: Secondary | ICD-10-CM | POA: Diagnosis not present

## 2017-07-28 DIAGNOSIS — Z7982 Long term (current) use of aspirin: Secondary | ICD-10-CM

## 2017-07-28 DIAGNOSIS — N390 Urinary tract infection, site not specified: Secondary | ICD-10-CM | POA: Diagnosis not present

## 2017-07-28 DIAGNOSIS — A419 Sepsis, unspecified organism: Principal | ICD-10-CM | POA: Diagnosis present

## 2017-07-28 DIAGNOSIS — F0280 Dementia in other diseases classified elsewhere without behavioral disturbance: Secondary | ICD-10-CM | POA: Diagnosis not present

## 2017-07-28 DIAGNOSIS — N179 Acute kidney failure, unspecified: Secondary | ICD-10-CM | POA: Diagnosis not present

## 2017-07-28 DIAGNOSIS — E875 Hyperkalemia: Secondary | ICD-10-CM | POA: Diagnosis not present

## 2017-07-28 DIAGNOSIS — R791 Abnormal coagulation profile: Secondary | ICD-10-CM | POA: Diagnosis not present

## 2017-07-28 DIAGNOSIS — I2510 Atherosclerotic heart disease of native coronary artery without angina pectoris: Secondary | ICD-10-CM | POA: Diagnosis present

## 2017-07-28 DIAGNOSIS — Z4889 Encounter for other specified surgical aftercare: Secondary | ICD-10-CM | POA: Diagnosis not present

## 2017-07-28 DIAGNOSIS — E872 Acidosis: Secondary | ICD-10-CM | POA: Diagnosis present

## 2017-07-28 DIAGNOSIS — R6520 Severe sepsis without septic shock: Secondary | ICD-10-CM | POA: Diagnosis not present

## 2017-07-28 DIAGNOSIS — M6281 Muscle weakness (generalized): Secondary | ICD-10-CM | POA: Diagnosis not present

## 2017-07-28 DIAGNOSIS — F0390 Unspecified dementia without behavioral disturbance: Secondary | ICD-10-CM | POA: Diagnosis not present

## 2017-07-28 DIAGNOSIS — R079 Chest pain, unspecified: Secondary | ICD-10-CM | POA: Diagnosis not present

## 2017-07-28 DIAGNOSIS — Z7901 Long term (current) use of anticoagulants: Secondary | ICD-10-CM

## 2017-07-28 HISTORY — DX: Acute kidney failure, unspecified: N17.9

## 2017-07-28 LAB — CBC WITH DIFFERENTIAL/PLATELET
BASOS PCT: 0 %
BASOS: 0 %
Basophils Absolute: 0 10*3/uL (ref 0.0–0.2)
Basophils Absolute: 0 10*3/uL (ref 0.0–0.1)
EOS (ABSOLUTE): 0.1 10*3/uL (ref 0.0–0.4)
Eos: 0 %
Eosinophils Absolute: 0.2 10*3/uL (ref 0.0–0.7)
Eosinophils Relative: 1 %
HCT: 40.2 % (ref 39.0–52.0)
HEMOGLOBIN: 13.4 g/dL (ref 13.0–17.0)
Hematocrit: 41.8 % (ref 37.5–51.0)
Hemoglobin: 13.3 g/dL (ref 13.0–17.7)
IMMATURE GRANULOCYTES: 1 %
Immature Grans (Abs): 0.1 10*3/uL (ref 0.0–0.1)
LYMPHS ABS: 2.1 10*3/uL (ref 0.7–4.0)
LYMPHS: 7 %
Lymphocytes Absolute: 1.4 10*3/uL (ref 0.7–3.1)
Lymphocytes Relative: 11 %
MCH: 31.4 pg (ref 26.6–33.0)
MCH: 31.7 pg (ref 26.0–34.0)
MCHC: 31.8 g/dL (ref 31.5–35.7)
MCHC: 33.3 g/dL (ref 30.0–36.0)
MCV: 99 fL — AB (ref 79–97)
MCV: 95 fL (ref 78.0–100.0)
MONOS ABS: 1.6 10*3/uL — AB (ref 0.1–0.9)
MONOS PCT: 8 %
Monocytes Absolute: 1.5 10*3/uL — ABNORMAL HIGH (ref 0.1–1.0)
Monocytes: 8 %
NEUTROS ABS: 15.5 10*3/uL — AB (ref 1.7–7.7)
NEUTROS PCT: 84 %
NEUTROS PCT: 80 %
Neutrophils Absolute: 16.2 10*3/uL — ABNORMAL HIGH (ref 1.4–7.0)
PLATELETS: 367 10*3/uL (ref 150–379)
Platelets: 299 10*3/uL (ref 150–400)
RBC: 4.24 x10E6/uL (ref 4.14–5.80)
RBC: 4.23 MIL/uL (ref 4.22–5.81)
RDW: 15.8 % — AB (ref 12.3–15.4)
RDW: 14.4 % (ref 11.5–15.5)
WBC: 19.5 10*3/uL — ABNORMAL HIGH (ref 3.4–10.8)
WBC: 19.2 10*3/uL — ABNORMAL HIGH (ref 4.0–10.5)

## 2017-07-28 LAB — COMPREHENSIVE METABOLIC PANEL
A/G RATIO: 1.7 (ref 1.2–2.2)
ALT: 11 IU/L (ref 0–44)
ALT: 13 U/L — ABNORMAL LOW (ref 17–63)
ANION GAP: 13 (ref 5–15)
AST: 18 IU/L (ref 0–40)
AST: 18 U/L (ref 15–41)
Albumin: 4.4 g/dL (ref 3.5–4.7)
Albumin: 3.7 g/dL (ref 3.5–5.0)
Alkaline Phosphatase: 70 IU/L (ref 39–117)
Alkaline Phosphatase: 59 U/L (ref 38–126)
BILIRUBIN TOTAL: 0.4 mg/dL (ref 0.0–1.2)
BUN: 123 mg/dL — ABNORMAL HIGH (ref 6–20)
CALCIUM: 9.6 mg/dL (ref 8.6–10.2)
CHLORIDE: 103 mmol/L (ref 96–106)
CHLORIDE: 102 mmol/L (ref 101–111)
CO2: 9 mmol/L — CL (ref 20–29)
CO2: 18 mmol/L — AB (ref 22–32)
Calcium: 9.1 mg/dL (ref 8.9–10.3)
Creatinine, Ser: 3.38 mg/dL — ABNORMAL HIGH (ref 0.76–1.27)
Creatinine, Ser: 3.34 mg/dL — ABNORMAL HIGH (ref 0.61–1.24)
GFR calc Af Amer: 18 mL/min/{1.73_m2} — ABNORMAL LOW (ref >59)
GFR calc non Af Amer: 16 mL/min/{1.73_m2} — ABNORMAL LOW (ref >59)
GFR calc non Af Amer: 16 mL/min — ABNORMAL LOW (ref >60)
GFR, EST AFRICAN AMERICAN: 18 mL/min — AB (ref >60)
GLUCOSE: 102 mg/dL — AB (ref 65–99)
Globulin, Total: 2.6 g/dL (ref 1.5–4.5)
Glucose, Bld: 100 mg/dL — ABNORMAL HIGH (ref 65–99)
POTASSIUM: 5.4 mmol/L — AB (ref 3.5–5.2)
Potassium: 4.8 mmol/L (ref 3.5–5.1)
SODIUM: 133 mmol/L — AB (ref 135–145)
Sodium: 141 mmol/L (ref 134–144)
Total Bilirubin: 0.9 mg/dL (ref 0.3–1.2)
Total Protein: 7 g/dL (ref 6.0–8.5)
Total Protein: 7.3 g/dL (ref 6.5–8.1)

## 2017-07-28 LAB — I-STAT TROPONIN, ED: Troponin i, poc: 0.08 ng/mL (ref 0.00–0.08)

## 2017-07-28 LAB — PROTIME-INR
INR: 3.8 — ABNORMAL HIGH (ref 0.8–1.2)
INR: 3.86
PROTHROMBIN TIME: 36.5 s — AB (ref 9.1–12.0)
PROTHROMBIN TIME: 37.7 s — AB (ref 11.4–15.2)

## 2017-07-28 LAB — URINALYSIS, ROUTINE W REFLEX MICROSCOPIC
Bilirubin Urine: NEGATIVE
GLUCOSE, UA: NEGATIVE mg/dL
KETONES UR: NEGATIVE mg/dL
NITRITE: NEGATIVE
Protein, ur: NEGATIVE mg/dL
Specific Gravity, Urine: 1.018 (ref 1.005–1.030)
pH: 5 (ref 5.0–8.0)

## 2017-07-28 LAB — TSH: TSH: 3.62 u[IU]/mL (ref 0.450–4.500)

## 2017-07-28 LAB — I-STAT CG4 LACTIC ACID, ED: Lactic Acid, Venous: 1.68 mmol/L (ref 0.5–1.9)

## 2017-07-28 LAB — VITAMIN B12: Vitamin B-12: 384 pg/mL (ref 232–1245)

## 2017-07-28 LAB — HGB A1C W/O EAG: Hgb A1c MFr Bld: 5.6 % (ref 4.8–5.6)

## 2017-07-28 MED ORDER — ACETAMINOPHEN 325 MG PO TABS
650.0000 mg | ORAL_TABLET | Freq: Four times a day (QID) | ORAL | Status: DC | PRN
  Administered 2017-07-28 – 2017-08-01 (×10): 650 mg via ORAL
  Filled 2017-07-28 (×10): qty 2

## 2017-07-28 MED ORDER — SODIUM CHLORIDE 0.9 % IV BOLUS
1000.0000 mL | Freq: Once | INTRAVENOUS | Status: AC
  Administered 2017-07-28: 1000 mL via INTRAVENOUS

## 2017-07-28 MED ORDER — ONDANSETRON HCL 4 MG/2ML IJ SOLN
4.0000 mg | Freq: Four times a day (QID) | INTRAMUSCULAR | Status: DC | PRN
  Administered 2017-07-28 – 2017-07-30 (×2): 4 mg via INTRAVENOUS
  Filled 2017-07-28 (×2): qty 2

## 2017-07-28 MED ORDER — ASPIRIN EC 81 MG PO TBEC
81.0000 mg | DELAYED_RELEASE_TABLET | Freq: Every day | ORAL | Status: DC
  Administered 2017-07-29 – 2017-08-01 (×4): 81 mg via ORAL
  Filled 2017-07-28 (×4): qty 1

## 2017-07-28 MED ORDER — TAMSULOSIN HCL 0.4 MG PO CAPS
0.4000 mg | ORAL_CAPSULE | Freq: Every day | ORAL | Status: DC
  Administered 2017-07-29 – 2017-08-01 (×4): 0.4 mg via ORAL
  Filled 2017-07-28 (×4): qty 1

## 2017-07-28 MED ORDER — SODIUM CHLORIDE 0.9 % IV SOLN
1.0000 g | INTRAVENOUS | Status: DC
  Administered 2017-07-29 – 2017-07-30 (×2): 1 g via INTRAVENOUS
  Filled 2017-07-28 (×3): qty 10

## 2017-07-28 MED ORDER — ONDANSETRON HCL 4 MG PO TABS: 4.0000 mg | ORAL_TABLET | Freq: Four times a day (QID) | ORAL | Status: DC | PRN

## 2017-07-28 MED ORDER — SODIUM CHLORIDE 0.9 % IV SOLN
2.0000 g | Freq: Once | INTRAVENOUS | Status: AC
  Administered 2017-07-28: 2 g via INTRAVENOUS
  Filled 2017-07-28: qty 20

## 2017-07-28 MED ORDER — SODIUM CHLORIDE 0.9 % IV SOLN
INTRAVENOUS | Status: DC
  Administered 2017-07-28 – 2017-07-30 (×2): via INTRAVENOUS

## 2017-07-28 MED ORDER — FINASTERIDE 5 MG PO TABS
5.0000 mg | ORAL_TABLET | Freq: Every day | ORAL | Status: DC
  Administered 2017-07-29 – 2017-08-01 (×4): 5 mg via ORAL
  Filled 2017-07-28 (×4): qty 1

## 2017-07-28 MED ORDER — PRAVASTATIN SODIUM 40 MG PO TABS
40.0000 mg | ORAL_TABLET | Freq: Every day | ORAL | Status: DC
  Administered 2017-07-29 – 2017-07-31 (×4): 40 mg via ORAL
  Filled 2017-07-28 (×4): qty 1

## 2017-07-28 MED ORDER — DONEPEZIL HCL 5 MG PO TABS
5.0000 mg | ORAL_TABLET | Freq: Every day | ORAL | Status: DC
  Administered 2017-07-29 – 2017-07-31 (×4): 5 mg via ORAL
  Filled 2017-07-28 (×4): qty 1

## 2017-07-28 MED ORDER — MEMANTINE HCL 10 MG PO TABS
5.0000 mg | ORAL_TABLET | Freq: Every day | ORAL | Status: DC
  Administered 2017-07-29 – 2017-07-31 (×3): 5 mg via ORAL
  Filled 2017-07-28 (×3): qty 1

## 2017-07-28 MED ORDER — ACETAMINOPHEN 650 MG RE SUPP: 650.0000 mg | Freq: Four times a day (QID) | RECTAL | Status: DC | PRN

## 2017-07-28 MED ORDER — VANCOMYCIN 50 MG/ML ORAL SOLUTION
125.0000 mg | Freq: Three times a day (TID) | ORAL | Status: DC
  Administered 2017-07-29 – 2017-08-01 (×14): 125 mg via ORAL
  Filled 2017-07-28 (×20): qty 2.5

## 2017-07-28 NOTE — ED Notes (Signed)
Patient transported to X-ray 

## 2017-07-28 NOTE — ED Provider Notes (Signed)
[facility] EMERGENCY DEPARTMENT Provider Note   CSN: [id number] Arrival date & time: 07/28/17  1549     History   Chief Complaint Chief Complaint  Patient presents with  . Abnormal Lab    HPI Juan Li is a 84 y.o. male.  HPI   Patient is a 84 year old male presenting with high white count and hypotension today.  Patient here from rehab.  He has history of Alzheimer's disease hypertension hyperlipidemia CAD chronic pain, and had recent C5-C6 discectomy on 11 March.  Patient is unable to provide any history secondary to dementia and confusion.  By notes it appears that patient was seen by neurology yesterday for increasing confusion.  They sent labs which was found to have a white count of 18 and found to have a low blood pressure today at his [place] home.  Patient does not note any increased pain or any concerns.  Patient appears to be on treatment for C. difficile.  Is unclear when or how this happened.   Past Medical History:  Diagnosis Date  . Arthritis 02-15-12   osteoarthritis,right knee, pain right wrist.,  . Arthritis    "hands, legs" (12/01/2016)  . Carotid artery disease ([place])    Carotid US 1/18: R 40-59; L 80-99  . Chronic lower back pain   . Complication of anesthesia 02-15-12   very confused, and very slow to awaken after anesthesia  . Coronary artery disease 02-15-12   Heart valve replaced   . Depression   . Hypercholesterolemia 02-15-12  . Hypertension   . Memory loss   . PAD (peripheral artery disease) ([place])   . S/P AVR (aortic valve replacement)    Mechanical [employer] AVR // Echo 12/17: Moderate LVH, EF 60-65, normal wall motion, grade 1 diastolic dysfunction, mechanical AVR okay with mean gradient 8 mmHg, MAC, mild mitral stenosis (mean gradient 2 mmHg), mild MR, mild RAE, PASP 25  . Urinary frequency     Patient Active Problem List   Diagnosis Date Noted  . Confusion 07/27/2017  . Weakness 07/27/2017  . Cervical  myelopathy ([place]) 07/11/2017  . Fall 06/24/2017  . Dizziness 06/24/2017  . (HFpEF) heart failure with preserved ejection fraction ([place]) 06/24/2017  . Peripheral vascular disease ([place]) 12/16/2016  . Acute gout involving toe of left foot 12/16/2016  . Claudication in peripheral vascular disease ([place]) 11/19/2016  . Non-healing wound of lower extremity 11/19/2016  . Cervical spondylosis with myelopathy 01/28/2016  . Metatarsalgia of left foot 07/30/2015  . Encounter for palliative care 11/21/2014  . Carotid artery disease ([place]) 07/18/2014  . Stenosis of cervical spine region 07/18/2014  . Encounter for chronic pain management 04/09/2014  . Bradycardia 10/06/2013  . Unspecified vitamin D deficiency 11/30/2012  . Chronic diastolic CHF (congestive heart failure) ([place]) 08/21/2012  . OA (osteoarthritis) of knee 02/20/2012  . Gait abnormality 09/02/2011  . Aortic valve disorder 06/28/2010  . Long term current use of anticoagulant therapy 06/28/2010  . H/O mechanical aortic valve replacement 06/28/2010  . AAA 01/20/2010  . HEART VALVE REPLACEMENT, HX OF 12/31/2008  . CKD (chronic kidney disease) stage 3, GFR 30-59 ml/min ([facility]) 08/06/2008  . Dementia 05/25/2007  . OSTEOARTHROSIS, GENERALIZED, MULTIPLE SITES 09/18/2006  . HYPERCHOLESTEROLEMIA 06/29/2006  . Gouty arthropathy 06/29/2006  . CARPAL TUNNEL SYNDROME 06/29/2006  . Essential hypertension 06/29/2006  . GASTROESOPHAGEAL REFLUX, NO ESOPHAGITIS 06/29/2006  . BPH (benign prostatic hyperplasia) 06/29/2006  . Skin cancer 06/29/2006  . BACK PAIN, LOW 06/29/2006  .  ROTATOR CUFF TENDONITIS 06/29/2006  . TOBACCO USE, QUIT 06/29/2006    Past Surgical History:  Procedure Laterality Date  . ABDOMINAL AORTAGRAM  12/01/2016  . ABDOMINAL AORTOGRAM N/A 12/01/2016   Procedure: ABDOMINAL AORTOGRAM;  Surgeon: Nelva Bush, MD;  Location: [facility];  Service: Cardiovascular;  Laterality: N/A;  . ANTERIOR CERVICAL DECOMP/DISCECTOMY FUSION        Decompression at the level of C3-C4 and C4-C5, allograft and plate from C3 to C%, microscope/notes 09/14/2010  . ANTERIOR CERVICAL DECOMP/DISCECTOMY FUSION N/A 07/11/2017   Procedure: CERVICAL FIVE-SIX ANTERIOR CERVICAL DECOMPRESSION/DISCECTOMY FUSION AND PLATE FIXATION;  Surgeon: Consuella Lose, MD;  Location: [facility];  Service: Neurosurgery;  Laterality: N/A;  CERVICAL FIVE-SIX ANTERIOR CERVICAL DECOMPRESSION/DISCECTOMY FUSION AND PLATE FIXATION  . BACK SURGERY    . CARDIAC VALVE REPLACEMENT    . CARPAL TUNNEL RELEASE Left 01/2003   Archie Endo 09/14/2010  . JOINT REPLACEMENT    . LOWER EXTREMITY ANGIOGRAPHY  12/01/2016  . LOWER EXTREMITY ANGIOGRAPHY Bilateral 12/01/2016   Procedure: Lower Extremity Angiography;  Surgeon: Nelva Bush, MD;  Location: [facility];  Service: Cardiovascular;  Laterality: Bilateral;  . MECHANICAL AORTIC VALVE REPLACEMENT  1993   Archie Endo 11/18/2016  . SHOULDER OPEN ROTATOR CUFF REPAIR Left 02/15/2012  . TOTAL KNEE ARTHROPLASTY  02/20/2012   Procedure: TOTAL KNEE ARTHROPLASTY;  Surgeon: Gearlean Alf, MD;  Location: [facility];  Service: Orthopedics;  Laterality: Right;  . TRANSURETHRAL RESECTION OF PROSTATE  02/01/2010   Archie Endo 02/06/2010        Home Medications    Prior to Admission medications   Medication Sig Start Date End Date Taking? Authorizing Provider  acetaminophen (TYLENOL) 500 MG tablet Take 500 mg by mouth every 4 (four) hours as needed for mild pain.   Yes [provider]  amLODipine (NORVASC) 10 MG tablet TAKE 1 TABLET BY MOUTH EVERY DAY Patient taking differently: TAKE 1 TABLET (10 MG) BY MOUTH EVERY DAY 05/29/17  Yes Josue Hector, MD  aspirin EC 81 MG tablet Take 81 mg by mouth daily.  04/20/11  Yes Larey Dresser, MD  donepezil (ARICEPT) 5 MG tablet Take 1 tablet (5 mg total) by mouth daily. Patient taking differently: Take 5 mg by mouth at bedtime.  09/15/16  Yes [name], Jamal Collin, MD  enalapril (VASOTEC) 20 MG tablet  Take 20 mg by mouth 2 (two) times daily.  03/22/17  Yes End, Harrell Gave, MD  finasteride (PROSCAR) 5 MG tablet TAKE 1 TABLET (5 MG TOTAL) BY MOUTH DAILY. 05/29/17  Yes [name], Jamal Collin, MD  hydrochlorothiazide (HYDRODIURIL) 25 MG tablet Take 25 mg daily by mouth.   Yes [provider]  memantine (NAMENDA) 5 MG tablet Take 1 tablet (5 mg total) by mouth 2 (two) times daily. 09/15/16  Yes [name], Jamal Collin, MD  mirabegron ER (MYRBETRIQ) 50 MG TB24 tablet Take 1 tablet (50 mg total) by mouth daily. 06/12/17  Yes [name], Jamal Collin, MD  Nutritional Supplements (FEEDING SUPPLEMENT, BOOST BREEZE,) LIQD Take 1 Can by mouth 2 (two) times daily.   Yes [provider]  potassium chloride SA (K-DUR,KLOR-CON) 20 MEQ tablet Take 20 mEq by mouth every other day.   Yes [provider]  pravastatin (PRAVACHOL) 40 MG tablet Take 1 tablet (40 mg total) by mouth every evening. Patient taking differently: Take 40 mg by mouth at bedtime.  09/15/16  Yes [name], Jamal Collin, MD  saccharomyces boulardii (FLORASTOR) 250 MG capsule Take 250 mg by mouth 2 (  two) times daily. 20 day course started 07/18/17   Yes [provider]  senna-docusate (SENNA-PLUS) 8.6-50 MG tablet Take 1 tablet by mouth daily as needed (constipation).   Yes [provider]  tamsulosin (FLOMAX) 0.4 MG CAPS capsule TAKE ONE CAPSULE BY MOUTH EVERY DAY Patient taking differently: TAKE ONE CAPSULE (0.4 MG) BY MOUTH EVERY DAY 03/07/17  Yes [name], Jamal Collin, MD  vancomycin (VANCOCIN) 250 MG capsule Take 250 mg by mouth 4 (four) times daily. 10 day course started 07/24/17   Yes [provider]  Vitamin D, Ergocalciferol, (DRISDOL) 50000 units CAPS capsule Take 50,000 Units by mouth every Wednesday.   Yes [provider]  warfarin (COUMADIN) 7.5 MG tablet Take 7.5 mg by mouth at bedtime.   Yes [provider]  colchicine 0.6 MG tablet Take 1 tablet by mouth two times a day for one day, then 1  tablet daily for one week. Patient not taking: Reported on 07/28/2017 01/30/17   Zenia Resides, MD  enoxaparin (LOVENOX) 80 MG/0.8ML injection Inject 0.8 mLs (80 mg total) into the skin every 12 (twelve) hours. Patient not taking: Reported on 07/28/2017 06/27/17   Larey Dresser, MD  warfarin (COUMADIN) 5 MG tablet TAKE 1 TABLET AS DIRECTED Patient not taking: Reported on 07/28/2017 06/21/17   Zenia Resides, MD    Family History Family History  Problem Relation Age of Onset  . Heart disease Mother   . Hyperlipidemia Mother   . Hypertension Mother   . Varicose Veins Mother   . Hyperlipidemia Father   . Hypertension Father   . Heart attack Father     Social History Social History   Tobacco Use  . Smoking status: Former Smoker    Packs/day: 1.00    Years: 50.00    Pack years: 50.00    Types: Cigarettes    Last attempt to quit: 08/10/1995    Years since quitting: 21.9  . Smokeless tobacco: Never Used  Substance Use Topics  . Alcohol use: No    Alcohol/week: 0.0 oz    Frequency: Never    Comment: 12/01/2016 "nothing in the 2000s"  . Drug use: No     Allergies   Patient has no known allergies.   Review of Systems Review of Systems  Unable to perform ROS: Dementia  Constitutional: Negative for activity change.  Cardiovascular: Negative for chest pain.  Gastrointestinal: Negative for abdominal pain.     Physical Exam Updated Vital Signs BP (!) 112/91   Pulse 83   Temp 97.7 F (36.5 C) (Oral)   Resp 17   SpO2 97%   Physical Exam  Constitutional: He appears well-nourished.  Elderly male in a c-collar, resting comfortably in bed.  HENT:  Head: Normocephalic.  Healing wound to the anterior neck no evidence of purulence or drainage.  Eyes: Conjunctivae are normal.  Cardiovascular: Normal rate.  Pulmonary/Chest: Effort normal and breath sounds normal. No respiratory distress.  Abdominal: Soft. Bowel sounds are normal. He exhibits no distension. There is no  tenderness.  Neurological: No cranial nerve deficit.  He is oriented to himself,  Skin: Skin is warm and dry. He is not diaphoretic.  Psychiatric: He has a normal mood and affect.     ED Treatments / Results  Labs (all labs ordered are listed, but only abnormal results are displayed) Labs Reviewed  COMPREHENSIVE METABOLIC PANEL - Abnormal; Notable for the following components:      Result Value   Sodium 133 (*)  CO2 18 (*)    Glucose, Bld 100 (*)    BUN 123 (*)    Creatinine, Ser 3.34 (*)    ALT 13 (*)    GFR calc non Af Amer 16 (*)    GFR calc Af Amer 18 (*)    All other components within normal limits  CBC WITH DIFFERENTIAL/PLATELET - Abnormal; Notable for the following components:   WBC 19.2 (*)    Neutro Abs 15.5 (*)    Monocytes Absolute 1.5 (*)    All other components within normal limits  URINALYSIS, ROUTINE W REFLEX MICROSCOPIC - Abnormal; Notable for the following components:   APPearance CLOUDY (*)    Hgb urine dipstick MODERATE (*)    Leukocytes, UA LARGE (*)    Bacteria, UA MANY (*)    Squamous Epithelial / LPF 0-5 (*)    All other components within normal limits  PROTIME-INR - Abnormal; Notable for the following components:   Prothrombin Time 37.7 (*)    All other components within normal limits  CULTURE, BLOOD (ROUTINE X 2)  CULTURE, BLOOD (ROUTINE X 2)  I-STAT TROPONIN, ED  I-STAT CG4 LACTIC ACID, ED    EKG None  Radiology Dg Chest 2 View  Result Date: 07/28/2017 CLINICAL DATA:  Chest pain.  Hypotension.  Leukocytosis. EXAM: CHEST - 2 VIEW COMPARISON:  08/15/2012 FINDINGS: Sequelae of prior sternotomy are again identified. The cardiomediastinal silhouette is within normal limits. Aortic atherosclerosis is noted. No airspace consolidation, edema, pleural effusion, pneumothorax is identified. The posterior costophrenic angles were incompletely imaged. Prior cervical spine fusion is noted. IMPRESSION: No active cardiopulmonary disease. Electronically  Signed   By: Logan Bores M.D.   On: 07/28/2017 17:01    Procedures Procedures (including critical care time)  Medications Ordered in ED Medications  cefTRIAXone (ROCEPHIN) 2 g in sodium chloride 0.9 % 100 mL IVPB (2 g Intravenous New Bag/Given 07/28/17 1904)  sodium chloride 0.9 % bolus 1,000 mL (has no administration in time range)     Initial Impression / Assessment and Plan / ED Course  I have reviewed the triage vital signs and the nursing notes.  Pertinent labs & imaging results that were available during my care of the patient were reviewed by me and considered in my medical decision making (see chart for details).    Patient is a 84 year old male presenting with high white count and hypotension today.  Patient here from rehab.  He has history of Alzheimer's disease hypertension hyperlipidemia CAD chronic pain, and had recent C5-C6 discectomy on 11 March.  Patient is unable to provide any history secondary to dementia and confusion.  By notes it appears that patient was seen by neurology yesterday for increasing confusion.  They sent labs which was found to have a white count of 18 and found to have a low blood pressure today at his [place] home.  Patient does not note any increased pain or any concerns.  Patient appears to be on treatment for C. difficile.  Is unclear when or how this happened.   7:22 PM Unfortunately patient is a poor historian we have scant information on him.  We will try to touch base with his wife.  It appears that he is a 24 year old who had recent discectomy done by neurosurgery at the beginning of this month.  His condition is appears to have deteriorated.  Treated for C. difficile, and was found to have hypotension, AKI,   7:21 PM Patient found to have urinary tract  infection.  Will give IV antibiotics.  Will give a liter of fluid bolus.  Patient found to have a new AK I.  In fact patient's creatinine went from 1-3.  Will admit to  hospitalist.  Final Clinical Impressions(s) / ED Diagnoses   Final diagnoses:  None    ED Discharge Orders    None       Macarthur Critchley, MD 07/28/17 [place]

## 2017-07-28 NOTE — H&P (Signed)
[facility] Admission History and Physical Service Pager: [phone]  Patient name: Juan Li Medical record number: [id number] Date of birth: Feb 22, 1932 Age: 84 y.o. Gender: male  Primary Care Provider: Zenia Resides, MD Consultants: none Code Status: Full  Chief Complaint: high white count and hypotension  Assessment and Plan: KANNEN MOXEY is a 84 y.o. male presenting with leukocytosis and hypotension . PMH is significant for Alzheimer's disease, hypertension, hyperlipidemia,, chronic pain, and had recent C5-C6 discectomy on 11 March, CKD3, OA, hx of mechanical heart valve replacement (on coumadin and ASA),HFpEF, Vitamin D Deficiency, PVD and Carotid Artery Disease.   Sepsis due to UTI: Initially presented with hypotension to 91/78, leukocytosis to 19.5. Lactic acid normal. UA with large leukocytes, TNTC WBC, and moderate hgb. Blood pressures improved with IV fluids.  - admit to telemetry, attending Dr. Erin Hearing - blood cultures ordered in the ED - urine culture   - s/p 1L bolus, NS @ 100cc/hr  - CTX per pharmacy  CDiff Colitis: no tenderness to palpation of abdomen. Spoke with nursing home; test was done to confirm. He was started on Vancomycin 234m QID at the nursing home on 3/25. Received two doses today prior to coming in.  - Vancomycin 1261mq 6 hr   Non-anion gap metabolic Acidosis: likely due to diarrhea - monitor   AKI on CKD3: Likely pre-renal in etiology due to his diarrhea. Baseline Cr 1.4-1.5. Cr 3.34 on admission.  S/p 1L bolus  - IVF as above  - strict intake and output - avoid nephrotoxic medications  S/p C5-C6 discectomy on 3/11: vasectomy at C5-6 with decompression of cervical stenosis with myelopathy, by Dr. NuKathyrn Sheriffn July 11, 2017. Has c-spine collar. Confirmed with nursing home that neurosurgery said okay to remove for meals and showers.  - Tylenol PRN for pain   AMS I Alzheimer's Dementia: unclear if this is his  baseline. Per nursing home, his mental status has been the same since his admission. Nursing home reports he is oriented to self, gets confused easily, and has difficulty with short term memory.  His wife reports that he seemed his usual self on 3/26, the last time she saw him. Wife reports of issues with short term memory. It seems that this might be his baseline after speaking to the above resources. No focal neurological deficits.  - continue home Aricept 30m57mhs,  - Namenda dose decreased to 30mg18mily due to AKI (from 5 mg BID)   Chronic HFpEF: Last ECHO 04/21/16 with EF of 60-65% with G1DD.  - volume depleted on exam, IVF as above. Will closely monitor   PVD I Carotid Artery Disease:   - ASA 81mg9mPravastatin 40mg 26my   H/o Mechanical aortic valve replacement: on ASA and Coumadin. INR supra-therapeutic at 3.86. No signs of bleeding - coumadin per pharmacy   Hypertension: holding home medication due to sepsis: Enalapril 20mg [id number] 25 mg daily - monitor  BPH:  - continue home Proscar 30mg [id number] Flomax 0.4mg dail47m- holding home Mirabegron    FEN/GI: soft diet Prophylaxis: elevated INR, on coumadin at home   Disposition: admit for management of sepsis 2/2 UTI   History of Present Illness:  Juan W PuNAHEIM BURGENy.o. m21e [facility].  Patient is unable to provide any history due to dementia and confusion.  He denies any chest pain, abdominal pain, shortness of breath.  Reports  of dysuria.  Reports of general back pain due to the that he is laying on.  Also reports of headache but denies any blurred vision.  Reports of some nausea.  Per chart review he was seen by neurology on 3/28 for increasing confusion.  Lab work was done which showed leukocytosis.  Spoke with nursing home staff at [facility].  His mentation has been stable since his admission shin to the nursing home.  Reports that he is usually  alert to self only, is confused, cannot retain much new information.  However at times he can have more clarity.  He was started on oral vancomycin on 3/25 for C. difficile infection.  They will forward the test results to the hospital.  He was also on Lovenox after his spine surgery.  This was stopped yesterday.  Reports that his appetite has been fine.  Staff reported that he was complaining of nausea intermittently since admission to the nursing home. Noted to have a low blood pressure today at the nursing home and was taken to the ED by EMS.   I also spoke to his wife who is his healthcare power of attorney (phone number: [phone]).  Wife last saw patient on Tuesday.  Reports that since he has been in the nursing home he has been at his baseline as far as mentation.  In the emergency department, he was noted to be afebrile with hypotension to 93/57.  His white blood cell count was 19.5.  His CMP showed an AK I to creatinine of 3.34 (baseline creatinine 1.4-1.5).  He also had an non-anion gap metabolic acidosis.  Lactic acid was 1.68.  Vitamin B12 384.  Chest x-ray was negative for any acute findings.  INR was elevated at 3.16.  Urinalysis with large leukocytes and too numerous to count white blood cells and moderate hemoglobin.  Blood cultures and urine culture obtained.  Patient was started on ceftriaxone.  Patient received 1 L bolus of normal saline; blood pressure improved after fluids.   Review Of Systems:   Review of Systems  Constitutional: Positive for chills.  HENT: Negative for congestion and sore throat.   Gastrointestinal: Positive for diarrhea and nausea. Negative for abdominal pain and vomiting.  Genitourinary: Positive for dysuria.  Musculoskeletal: Positive for back pain and joint pain.  Neurological: Positive for headaches.    Patient Active Problem List   Diagnosis Date Noted  . AKI (acute kidney injury) ([place]) 07/28/2017  . Confusion 07/27/2017  . Weakness 07/27/2017  .  Cervical myelopathy ([place]) 07/11/2017  . Fall 06/24/2017  . Dizziness 06/24/2017  . (HFpEF) heart failure with preserved ejection fraction ([place]) 06/24/2017  . Peripheral vascular disease ([place]) 12/16/2016  . Acute gout involving toe of left foot 12/16/2016  . Claudication in peripheral vascular disease ([place]) 11/19/2016  . Non-healing wound of lower extremity 11/19/2016  . Cervical spondylosis with myelopathy 01/28/2016  . Metatarsalgia of left foot 07/30/2015  . Encounter for palliative care 11/21/2014  . Carotid artery disease ([place]) 07/18/2014  . Stenosis of cervical spine region 07/18/2014  . Encounter for chronic pain management 04/09/2014  . Bradycardia 10/06/2013  . Unspecified vitamin D deficiency 11/30/2012  . Chronic diastolic CHF (congestive heart failure) ([place]) 08/21/2012  . OA (osteoarthritis) of knee 02/20/2012  . Gait abnormality 09/02/2011  . Aortic valve disorder 06/28/2010  . Long term current use of anticoagulant therapy 06/28/2010  . H/O mechanical aortic valve replacement 06/28/2010  . AAA 01/20/2010  . HEART VALVE REPLACEMENT, HX  OF 12/31/2008  . CKD (chronic kidney disease) stage 3, GFR 30-59 ml/min (HCC) 08/06/2008  . Dementia 05/25/2007  . OSTEOARTHROSIS, GENERALIZED, MULTIPLE SITES 09/18/2006  . HYPERCHOLESTEROLEMIA 06/29/2006  . Gouty arthropathy 06/29/2006  . CARPAL TUNNEL SYNDROME 06/29/2006  . Essential hypertension 06/29/2006  . GASTROESOPHAGEAL REFLUX, NO ESOPHAGITIS 06/29/2006  . BPH (benign prostatic hyperplasia) 06/29/2006  . Skin cancer 06/29/2006  . BACK PAIN, LOW 06/29/2006  . ROTATOR CUFF TENDONITIS 06/29/2006  . TOBACCO USE, QUIT 06/29/2006    Past Medical History: Past Medical History:  Diagnosis Date  . Arthritis 02-15-12   osteoarthritis,right knee, pain right wrist.,  . Arthritis    "hands, legs" (12/01/2016)  . Carotid artery disease ([place])    Carotid US 1/18: R 40-59; L 80-99  . Chronic lower back pain   . Complication of  anesthesia 02-15-12   very confused, and very slow to awaken after anesthesia  . Coronary artery disease 02-15-12   Heart valve replaced   . Depression   . Hypercholesterolemia 02-15-12  . Hypertension   . Memory loss   . PAD (peripheral artery disease) ([place])   . S/P AVR (aortic valve replacement)    Mechanical [employer] Jude AVR // Echo 12/17: Moderate LVH, EF 60-65, normal wall motion, grade 1 diastolic dysfunction, mechanical AVR okay with mean gradient 8 mmHg, MAC, mild mitral stenosis (mean gradient 2 mmHg), mild MR, mild RAE, PASP 25  . Urinary frequency     Past Surgical History: Past Surgical History:  Procedure Laterality Date  . ABDOMINAL AORTAGRAM  12/01/2016  . ABDOMINAL AORTOGRAM N/A 12/01/2016   Procedure: ABDOMINAL AORTOGRAM;  Surgeon: Nelva Bush, MD;  Location: [facility];  Service: Cardiovascular;  Laterality: N/A;  . ANTERIOR CERVICAL DECOMP/DISCECTOMY FUSION       Decompression at the level of C3-C4 and C4-C5, allograft and plate from C3 to C%, microscope/notes 09/14/2010  . ANTERIOR CERVICAL DECOMP/DISCECTOMY FUSION N/A 07/11/2017   Procedure: CERVICAL FIVE-SIX ANTERIOR CERVICAL DECOMPRESSION/DISCECTOMY FUSION AND PLATE FIXATION;  Surgeon: Consuella Lose, MD;  Location: [facility];  Service: Neurosurgery;  Laterality: N/A;  CERVICAL FIVE-SIX ANTERIOR CERVICAL DECOMPRESSION/DISCECTOMY FUSION AND PLATE FIXATION  . BACK SURGERY    . CARDIAC VALVE REPLACEMENT    . CARPAL TUNNEL RELEASE Left 01/2003   Archie Endo 09/14/2010  . JOINT REPLACEMENT    . LOWER EXTREMITY ANGIOGRAPHY  12/01/2016  . LOWER EXTREMITY ANGIOGRAPHY Bilateral 12/01/2016   Procedure: Lower Extremity Angiography;  Surgeon: Nelva Bush, MD;  Location: [facility];  Service: Cardiovascular;  Laterality: Bilateral;  . MECHANICAL AORTIC VALVE REPLACEMENT  1993   Archie Endo 11/18/2016  . SHOULDER OPEN ROTATOR CUFF REPAIR Left 02/15/2012  . TOTAL KNEE ARTHROPLASTY  02/20/2012   Procedure: TOTAL KNEE  ARTHROPLASTY;  Surgeon: Gearlean Alf, MD;  Location: [facility];  Service: Orthopedics;  Laterality: Right;  . TRANSURETHRAL RESECTION OF PROSTATE  02/01/2010   Archie Endo 02/06/2010    Social History: Social History   Tobacco Use  . Smoking status: Former Smoker    Packs/day: 1.00    Years: 50.00    Pack years: 50.00    Types: Cigarettes    Last attempt to quit: 08/10/1995    Years since quitting: 21.9  . Smokeless tobacco: Never Used  Substance Use Topics  . Alcohol use: No    Alcohol/week: 0.0 oz    Frequency: Never    Comment: 12/01/2016 "nothing in the 2000s"  . Drug use: No   Additional social history:  Please also  refer to relevant sections of EMR.  Family History: Family History  Problem Relation Age of Onset  . Heart disease Mother   . Hyperlipidemia Mother   . Hypertension Mother   . Varicose Veins Mother   . Hyperlipidemia Father   . Hypertension Father   . Heart attack Father     Allergies and Medications: No Known Allergies No current facility-administered medications on file prior to encounter.    Current Outpatient Medications on File Prior to Encounter  Medication Sig Dispense Refill  . acetaminophen (TYLENOL) 500 MG tablet Take 500 mg by mouth every 4 (four) hours as needed for mild pain.    Marland Kitchen amLODipine (NORVASC) 10 MG tablet TAKE 1 TABLET BY MOUTH EVERY DAY (Patient taking differently: TAKE 1 TABLET (10 MG) BY MOUTH EVERY DAY) 30 tablet 10  . aspirin EC 81 MG tablet Take 81 mg by mouth daily.     Marland Kitchen donepezil (ARICEPT) 5 MG tablet Take 1 tablet (5 mg total) by mouth daily. (Patient taking differently: Take 5 mg by mouth at bedtime. ) 90 tablet 3  . enalapril (VASOTEC) 20 MG tablet Take 20 mg by mouth 2 (two) times daily.     . finasteride (PROSCAR) 5 MG tablet TAKE 1 TABLET (5 MG TOTAL) BY MOUTH DAILY. 90 tablet 3  . hydrochlorothiazide (HYDRODIURIL) 25 MG tablet Take 25 mg daily by mouth.    . memantine (NAMENDA) 5 MG tablet Take 1 tablet (5 mg total) by  mouth 2 (two) times daily. 180 tablet 3  . mirabegron ER (MYRBETRIQ) 50 MG TB24 tablet Take 1 tablet (50 mg total) by mouth daily. 30 tablet   . Nutritional Supplements (FEEDING SUPPLEMENT, BOOST BREEZE,) LIQD Take 1 Can by mouth 2 (two) times daily.    . potassium chloride SA (K-DUR,KLOR-CON) 20 MEQ tablet Take 20 mEq by mouth every other day.    . pravastatin (PRAVACHOL) 40 MG tablet Take 1 tablet (40 mg total) by mouth every evening. (Patient taking differently: Take 40 mg by mouth at bedtime. ) 90 tablet 3  . saccharomyces boulardii (FLORASTOR) 250 MG capsule Take 250 mg by mouth 2 (two) times daily. 20 day course started 07/18/17    . senna-docusate (SENNA-PLUS) 8.6-50 MG tablet Take 1 tablet by mouth daily as needed (constipation).    . tamsulosin (FLOMAX) 0.4 MG CAPS capsule TAKE ONE CAPSULE BY MOUTH EVERY DAY (Patient taking differently: TAKE ONE CAPSULE (0.4 MG) BY MOUTH EVERY DAY) 90 capsule 3  . vancomycin (VANCOCIN) 250 MG capsule Take 250 mg by mouth 4 (four) times daily. 10 day course started 07/24/17    . Vitamin D, Ergocalciferol, (DRISDOL) 50000 units CAPS capsule Take 50,000 Units by mouth every Wednesday.    . warfarin (COUMADIN) 7.5 MG tablet Take 7.5 mg by mouth at bedtime.    . colchicine 0.6 MG tablet Take 1 tablet by mouth two times a day for one day, then 1 tablet daily for one week. (Patient not taking: Reported on 07/28/2017) 30 tablet 3  . enoxaparin (LOVENOX) 80 MG/0.8ML injection Inject 0.8 mLs (80 mg total) into the skin every 12 (twelve) hours. (Patient not taking: Reported on 07/28/2017) 20 Syringe 1  . warfarin (COUMADIN) 5 MG tablet TAKE 1 TABLET AS DIRECTED (Patient not taking: Reported on 07/28/2017) 100 tablet 3    Objective: BP (!) 112/91   Pulse 83   Temp 97.7 F (36.5 C) (Oral)   Resp 17   SpO2 97%  Exam: General: NAD, pleasant,  in neck brace Eyes: Myotic pupils bilaterally, PERRL, EOMI, no conjunctival pallor or injection ENTM: Somewhat dry mucous  membranes, no pharyngeal erythema or exudate Neck: Supple, no LAD Cardiovascular: RRR, no LE edema Respiratory: CTA BL, normal work of breathing Gastrointestinal: soft, nontender, nondistended, normoactive BS MSK: moves 4 extremities equally Derm: no rashes appreciated Neuro: CN II-XII grossly intact Psych: Alert, oriented to self and place [facility]).  Not oriented to year. appropriate affect  Labs and Imaging: CBC BMET  Recent Labs  Lab 07/28/17 1618  WBC 19.2*  HGB 13.4  HCT 40.2  PLT 299   Recent Labs  Lab 07/28/17 1618  NA 133*  K 4.8  CL 102  CO2 18*  BUN 123*  CREATININE 3.34*  GLUCOSE 100*  CALCIUM 9.1    INR: 3.86 Lactic acid: 1.68 Vitamin B12 384 I-STAT troponin 0 0.08 UA: large leukocytosis, TNTC WBC, moderate hgb   Chest x-ray: FINDINGS: Sequelae of prior sternotomy are again identified. The cardiomediastinal silhouette is within normal limits. Aortic atherosclerosis is noted. No airspace consolidation, edema, pleural effusion, pneumothorax is identified. The posterior costophrenic angles were incompletely imaged. Prior cervical spine fusion is noted.  IMPRESSION: No active cardiopulmonary disease.  Dennie Fetters, MD 07/28/2017, 7:29 PM PGY-3, [place] Intern pager: [phone], text pages welcome

## 2017-07-28 NOTE — ED Triage Notes (Signed)
Pt arrives via EMS from [facility] with reports of increased WBC and hypotension. Pt with recent discectomy and + cdiff. Pt bp 90/50, HR 70, 100% RA. Pt arrives with aspen collar in place due to c5-c6 discectomy.

## 2017-07-28 NOTE — Progress Notes (Signed)
Pt. Came to the floor from [facility] at 2112, alert and oriented to self , person and environment,  Clear speech, on telemetry,  Denies pain and discomfort, skin checked and assessment done, incontinent of bladder and bowel, attending provider paged, no new abnormal issue noted, NPO, call light within reach, will continue to monitor.

## 2017-07-28 NOTE — Progress Notes (Signed)
ANTICOAGULATION CONSULT NOTE - Initial Consult  Pharmacy Consult for Warfarin  Indication: Mechanical AVR  No Known Allergies   Vital Signs: Temp: 98.3 F (36.8 C) (03/29 2219) Temp Source: Oral (03/29 2219) BP: 116/50 (03/29 2219) Pulse Rate: 89 (03/29 2219)  Labs: Recent Labs    07/27/17 1239 07/28/17 1618 07/28/17 1728  HGB 13.3 13.4  --   HCT 41.8 40.2  --   PLT 367 299  --   LABPROT 36.5*  --  37.7*  INR 3.8*  --  3.86  CREATININE 3.38* 3.34*  --     CrCl cannot be calculated (Unknown ideal weight.).   Medical History: Past Medical History:  Diagnosis Date  . Arthritis 02-15-12   osteoarthritis,right knee, pain right wrist.,  . Arthritis    "hands, legs" (12/01/2016)  . Carotid artery disease ([place])    Carotid US 1/18: R 40-59; L 80-99  . Chronic lower back pain   . Complication of anesthesia 02-15-12   very confused, and very slow to awaken after anesthesia  . Coronary artery disease 02-15-12   Heart valve replaced   . Depression   . Hypercholesterolemia 02-15-12  . Hypertension   . Memory loss   . PAD (peripheral artery disease) ([place])   . S/P AVR (aortic valve replacement)    Mechanical [employer] Jude AVR // Echo 12/17: Moderate LVH, EF 60-65, normal wall motion, grade 1 diastolic dysfunction, mechanical AVR okay with mean gradient 8 mmHg, MAC, mild mitral stenosis (mean gradient 2 mmHg), mild MR, mild RAE, PASP 25  . Urinary frequency     Assessment: 84 y/o M on warfarin PTA for mechanical AVR, INR goal is 2.5-3.5 per outpatient anticoag notes, CBC good, noted renal dysfunction, INR is supra-therapeutic at 3.86  Goal of Therapy:  INR 2.5-3.5 Monitor platelets by anticoagulation protocol: Yes   Plan:  No warfarin tonight Daily PT/INR, re-start warfarin as INR allows  Narda Bonds 07/28/2017,11:26 PM

## 2017-07-28 NOTE — Telephone Encounter (Signed)
Spoke with Aldona Lento, LPN at [facility] and reviewed below lab results, and urgent need for eval for source of infection, decreased renal function, and elevated inr.  She verbalized understanding of same.  I have faxed lab results to her at [phone]

## 2017-07-28 NOTE — Telephone Encounter (Signed)
please call patient, he lives at Nursing home,  Laboratory evaluation showed significantly elevated INR 3.8, he is currently on Lovenox, aspirin, and Coumadin, his primary care physician Dr. Philip Aspen is in charge of his anticoagulation treatment, at this point, please hold of Lovenox, and aspirin, I have faxed the lab results to his primary care physician  There was also elevated WBC, with elevated absolute neutrophil, indicating active infection,   Worsening elevated creatinine 3.38, with decreased GFR from baseline of 43 to 16, acute on chronic renal failure,   He needs to be evaluated by the physician at the facility soon as possible.

## 2017-07-29 DIAGNOSIS — N179 Acute kidney failure, unspecified: Secondary | ICD-10-CM

## 2017-07-29 HISTORY — DX: Acute kidney failure, unspecified: N17.9

## 2017-07-29 LAB — CBC
HCT: 38.2 % — ABNORMAL LOW (ref 39.0–52.0)
HEMOGLOBIN: 12.5 g/dL — AB (ref 13.0–17.0)
MCH: 31.3 pg (ref 26.0–34.0)
MCHC: 32.7 g/dL (ref 30.0–36.0)
MCV: 95.5 fL (ref 78.0–100.0)
PLATELETS: 307 10*3/uL (ref 150–400)
RBC: 4 MIL/uL — AB (ref 4.22–5.81)
RDW: 14.6 % (ref 11.5–15.5)
WBC: 18.9 10*3/uL — ABNORMAL HIGH (ref 4.0–10.5)

## 2017-07-29 LAB — BASIC METABOLIC PANEL
Anion gap: 9 (ref 5–15)
BUN: 109 mg/dL — ABNORMAL HIGH (ref 6–20)
CO2: 18 mmol/L — ABNORMAL LOW (ref 22–32)
CREATININE: 2.61 mg/dL — AB (ref 0.61–1.24)
Calcium: 8.6 mg/dL — ABNORMAL LOW (ref 8.9–10.3)
Chloride: 109 mmol/L (ref 101–111)
GFR, EST AFRICAN AMERICAN: 25 mL/min — AB (ref >60)
GFR, EST NON AFRICAN AMERICAN: 21 mL/min — AB (ref >60)
Glucose, Bld: 103 mg/dL — ABNORMAL HIGH (ref 65–99)
Potassium: 5.5 mmol/L — ABNORMAL HIGH (ref 3.5–5.1)
SODIUM: 136 mmol/L (ref 135–145)

## 2017-07-29 LAB — PROTIME-INR
INR: 3.64
Prothrombin Time: 35.9 seconds — ABNORMAL HIGH (ref 11.4–15.2)

## 2017-07-29 MED ORDER — SODIUM CHLORIDE 0.9 % IV BOLUS
500.0000 mL | Freq: Once | INTRAVENOUS | Status: AC
  Administered 2017-07-29: 500 mL via INTRAVENOUS

## 2017-07-29 MED ORDER — WARFARIN - PHARMACIST DOSING INPATIENT
Freq: Every day | Status: DC
  Administered 2017-07-29 – 2017-07-30 (×2)

## 2017-07-29 MED ORDER — WARFARIN SODIUM 5 MG PO TABS
5.0000 mg | ORAL_TABLET | Freq: Once | ORAL | Status: AC
  Administered 2017-07-29: 5 mg via ORAL
  Filled 2017-07-29: qty 1

## 2017-07-29 NOTE — Progress Notes (Signed)
Family Medicine Teaching Service Daily Progress Note Intern Pager: [phone]  Patient name: Juan Li Medical record number: [id number] Date of birth: 20-Feb-1932 Age: 84 y.o. Gender: male  Primary Care Provider: Zenia Resides, MD Consultants: None Code Status: FULL  Pt Overview and Major Events to Date:  03/29 Admitted to FPTS  Assessment and Plan: Juan Li is a 84 y.o. male presenting with leukocytosis and hypotension . PMH is significant for Alzheimer's disease, hypertension, hyperlipidemia, chronic pain, and recent C5-C6discectomy on 11 March, CKD3, OA, hx of mechanical heart valve replacement (on coumadin and ASA), HFpEF, Vitamin D Deficiency, PVD and CAD.   Sepsis due to UTI: Initially presented with hypotension to 91/78, leukocytosis to 19.5. Lactic acid normal. UA with large leukocytes, TNTC WBC, and moderate Hgb. Blood pressures improved with IV fluids. BP 107-137/~50 overnight.  - Telemetry - Blood cx pending - Urine cx pending - NS@100cc /hr  - CTX per pharmacy - Can give bolus PRN hypotension  CDiff Colitis: Confirmatory testing performed at SNF. Started on PO vancomycin 250mg  QID at the nursing home on 3/25.  - PO vancomycin 125mg  q 6 hr   Non-anion gap metabolic Acidosis: Likely due to diarrhea. Improving after IVF hydration.  - Monitor   AKI on CKD3: Likely pre-renal in etiology due to his diarrhea. Baseline Cr 1.4-1.5. Cr 3.34 on admission. Cr improved further to 2.6 this AM (03/330).   - IVF as above  - strict intake and output - avoid nephrotoxic medications  S/p C5-C6discectomy on 3/11: Discectomy at C5-6 with decompression of cervical stenosis with myelopathy, by Dr. Kathyrn Sheriff on 07/11/17. Has C-spine collar. Confirmed with nursing home that neurosurgery said okay to remove for meals and showers.  - Tylenol PRN for pain   AMS I Alzheimer's Dementia: Unclear if this is his baseline. Per nursing home, his mental status has been the same  since his admission. Nursing home reports he is oriented to self, gets confused easily, and has difficulty with short term memory. Wife reports of issues with short term memory. It seems that this might be his baseline after speaking to the above resources. No focal neurological deficits.  - Continue home Aricept 5mg  qhs  - Namenda dose decreased to 5mg  daily due to AKI (from 5 mg BID)   Chronic HFpEF: Last ECHO 04/21/16 with EF of 60-65% with G1DD.  - Cont IVF due to volume depletion on presentation. Will closely monitor.   PVD I Carotid Artery Disease:   - ASA 81mg   - Pravastatin 40mg  daily   H/o Mechanical aortic valve replacement: On ASA and Coumadin. INR supra-therapeutic at 3.86. No signs of bleeding. - Coumadin per pharmacy   Hypertension: Holding home medication due to sepsis: Enalapril 20mg  BID, HCTZ 25 mg daily. Remained hypotensive overnight, though improved.  - Monitor  BPH:  - Continue home Proscar 5mg  daily and Flomax 0.4mg  daily  - Holding home Mirabegron   FEN/GI: soft diet Prophylaxis: elevated INR, on coumadin at home   Subjective:  No acute events overnight. Patient says he is unsure how he got here and has only a vague recollection of arriving at the hospital yesterday. Endorses low back pain from his recent surgery. Also endorsing dry sore throat with pain with swallowing. Says that he still feels a bit confused and foggy this morning. Is unsure if he is still having diarrhea.  Objective: Temp:  [97.7 F (36.5 C)-98.3 F (36.8 C)] 97.9 F (36.6 C) (03/30 0500) Pulse Rate:  [65-124] 80 (  03/30 0500) Resp:  [13-25] 14 (03/29 2045) BP: (88-137)/(47-104) 107/47 (03/30 0500) SpO2:  [90 %-100 %] 100 % (03/30 0500) Weight:  [164 lb 3.9 oz (74.5 kg)] 164 lb 3.9 oz (74.5 kg) (03/30 0500)   Physical Exam: General: sitting up in bedside table in NAD; cervical collar in place Cardiovascular: RRR, no murmurs appreciated Respiratory: CTAB, normal WOB on  RA Abdomen: soft, NTND, +BS Extremities: moving all spontaneously Neuro: oriented to person and place Psych: appropriate mood and affect  Laboratory: Recent Labs  Lab 07/27/17 1239 07/28/17 1618 07/29/17 0708  WBC 19.5* 19.2* 18.9*  HGB 13.3 13.4 12.5*  HCT 41.8 40.2 38.2*  PLT 367 299 307   Recent Labs  Lab 07/27/17 1239 07/28/17 1618 07/29/17 0708  NA 141 133* 136  K 5.4* 4.8 5.5*  CL 103 102 109  CO2 9* 18* 18*  BUN CANCELED 123* 109*  CREATININE 3.38* 3.34* 2.61*  CALCIUM 9.6 9.1 8.6*  PROT 7.0 7.3  --   BILITOT 0.4 0.9  --   ALKPHOS 70 59  --   ALT 11 13*  --   AST 18 18  --   GLUCOSE 102* 100* 103*    Imaging/Diagnostic Tests: Dg Chest 2 View  Result Date: 07/28/2017 CLINICAL DATA:  Chest pain.  Hypotension.  Leukocytosis. EXAM: CHEST - 2 VIEW COMPARISON:  08/15/2012 FINDINGS: Sequelae of prior sternotomy are again identified. The cardiomediastinal silhouette is within normal limits. Aortic atherosclerosis is noted. No airspace consolidation, edema, pleural effusion, pneumothorax is identified. The posterior costophrenic angles were incompletely imaged. Prior cervical spine fusion is noted. IMPRESSION: No active cardiopulmonary disease. Electronically Signed   By: Logan Bores M.D.   On: 07/28/2017 17:01     Verner Mould, MD 07/29/2017, 9:27 AM PGY-3, [place] Intern pager: [phone], text pages welcome

## 2017-07-29 NOTE — Progress Notes (Signed)
ANTICOAGULATION CONSULT NOTE - Initial Consult  Pharmacy Consult for Warfarin  Indication: Mechanical AVR  No Known Allergies   Vital Signs: Temp: 97.9 F (36.6 C) (03/30 0500) Temp Source: Oral (03/30 0500) BP: 107/47 (03/30 0500) Pulse Rate: 80 (03/30 0500)  Labs: Recent Labs    07/27/17 1239 07/28/17 1618 07/28/17 1728 07/29/17 0708  HGB 13.3 13.4  --  12.5*  HCT 41.8 40.2  --  38.2*  PLT 367 299  --  307  LABPROT 36.5*  --  37.7* 35.9*  INR 3.8*  --  3.86 3.64  CREATININE 3.38* 3.34*  --  2.61*    Estimated Creatinine Clearance: 19.4 mL/min (A) (by C-G formula based on SCr of 2.61 mg/dL (H)).   Medical History: Past Medical History:  Diagnosis Date  . Arthritis 02-15-12   osteoarthritis,right knee, pain right wrist.,  . Arthritis    "hands, legs" (12/01/2016)  . Carotid artery disease ([place])    Carotid US 1/18: R 40-59; L 80-99  . Chronic lower back pain   . Complication of anesthesia 02-15-12   very confused, and very slow to awaken after anesthesia  . Coronary artery disease 02-15-12   Heart valve replaced   . Depression   . Hypercholesterolemia 02-15-12  . Hypertension   . Memory loss   . PAD (peripheral artery disease) ([place])   . S/P AVR (aortic valve replacement)    Mechanical [employer] Jude AVR // Echo 12/17: Moderate LVH, EF 60-65, normal wall motion, grade 1 diastolic dysfunction, mechanical AVR okay with mean gradient 8 mmHg, MAC, mild mitral stenosis (mean gradient 2 mmHg), mild MR, mild RAE, PASP 25  . Urinary frequency     Assessment: 84 y/o M on coumadin 7.60m daily PTA for mechanical aortic valve (1993). INR on admit is 3/86. Although last anticoag note on 2/26 states dose is 5177mdaily exc 2.77m677mn Thurs/Sun with a therapeutic INR. Was recently on bridge with Lovenox after surgery. INR 3.8 on admit. Last dose was on 3/28. INR back down to 3.64 today (peaked at 3.86). Hgb and plts wnl.  Goal of Therapy:  INR 2.5-3.5 Monitor platelets by  anticoagulation protocol: Yes   Plan:  Give Coumadin 77mg27m x 1 tonight Monitor daily INR, CBC, s/s of bleed  Juan Li J 07/29/2017,9:00 AM

## 2017-07-29 NOTE — Progress Notes (Signed)
Family Medicine Teaching Service Daily Progress Note Intern Pager: [phone]  Patient name: Juan Li Medical record number: [id number] Date of birth: 1931/10/08 Age: 84 y.o. Gender: male  Primary Care Provider: Zenia Resides, MD Consultants: None Code Status: FULL  Pt Overview and Major Events to Date:  03/29 Admitted to FPTS  Assessment and Plan: Juan Li is a 84 y.o. male presenting with leukocytosis and hypotension . PMH is significant for Alzheimer's disease, hypertension, hyperlipidemia, chronic pain, and recent C5-C6discectomy on 11 March, CKD3, OA, hx of mechanical heart valve replacement (on coumadin and ASA), HFpEF, Vitamin D Deficiency, PVD and CAD.   Sepsis due to UTI: Initially presented with hypotension to 91/78, leukocytosis to 19.5. Lactic acid normal. UA with large leukocytes, TNTC WBC, and moderate Hgb. Blood pressures improved with IV fluids. BP mostly normotensive (124-148/56-91) overnight.  - Telemetry - Blood cx NGx<24hr - Urine cx pending - NS@100cc /hr  - CTX per pharmacy - Can give bolus PRN hypotension  CDiff Colitis: Confirmatory testing performed at SNF. Started on PO vancomycin 250mg  QID at the nursing home on 3/25.  - PO vancomycin 125mg  q 6 hr   Non-anion gap metabolic Acidosis: Likely due to diarrhea. Improving after IVF hydration.  - Monitor   AKI on CKD3: Likely pre-renal in etiology due to his diarrhea. Baseline Cr 1.4-1.5. Cr 3.34 on admission. Cr improved further to 1.82 this AM (03/31).   - IVF as above  - Strict intake and output - Avoid nephrotoxic medications  S/p C5-C6discectomy on 3/11: Discectomy at C5-6 with decompression of cervical stenosis with myelopathy, by Dr. Kathyrn Sheriff on 07/11/17. Has C-spine collar. Confirmed with nursing home that neurosurgery said okay to remove for meals and showers.  - Tylenol PRN pain   AMS I Alzheimer's Dementia: Unclear if this is his baseline. Per nursing home, his mental status  has been the same since his admission. Nursing home reports he is oriented to self, gets confused easily, and has difficulty with short term memory. Wife reports of issues with short term memory. It seems that this might be his baseline after speaking to the above resources. No focal neurological deficits.  - Continue home Aricept 5mg  qhs  - Namenda dose decreased to 5mg  daily due to AKI (from 5 mg BID)   Chronic HFpEF: Last ECHO 04/21/16 with EF of 60-65% with G1DD.  - Cont IVF due to volume depletion on presentation. Will closely monitor.   PVD I Carotid Artery Disease:   - ASA 81mg   - Pravastatin 40mg  daily   H/o Mechanical aortic valve replacement: On ASA and Coumadin. INR supra-therapeutic at 3.86 on admission. Elevated further to 4.13 today (03/31). - Coumadin per pharmacy   Hypertension: Holding home medication due to sepsis: Enalapril 20mg  BID, HCTZ 25 mg daily. Normotensive overnight.  - Monitor  BPH:  - Continue home Proscar 5mg  daily and Flomax 0.4mg  daily  - Holding home Mirabegron   FEN/GI: soft diet Prophylaxis: elevated INR, on coumadin at home   Subjective:  No acute events overnight. Endorses low back and neck pain but denies any new complaints.   Objective: Temp:  [97.9 F (36.6 C)-98.2 F (36.8 C)] 98.2 F (36.8 C) (03/30 2036) Pulse Rate:  [66-78] 78 (03/30 2036) Resp:  [20] 20 (03/30 2036) BP: (124-148)/(56-91) 148/91 (03/30 2036) SpO2:  [100 %] 100 % (03/30 2036)   Physical Exam: General: sleeping peacefully in bed in NAD; cervical collar in place Cardiovascular: RRR, no murmurs appreciated Respiratory: CTAB, normal  WOB on RA Abdomen: soft, NTND, +BS Extremities: moving all spontaneously Neuro: oriented to person and place Psych: appropriate mood and affect  Laboratory: Recent Labs  Lab 07/28/17 1618 07/29/17 0708 07/30/17 0448  WBC 19.2* 18.9* 18.1*  HGB 13.4 12.5* 12.8*  HCT 40.2 38.2* 39.2  PLT 299 307 307   Recent Labs  Lab  07/27/17 1239 07/28/17 1618 07/29/17 0708 07/30/17 0448  NA 141 133* 136 140  K 5.4* 4.8 5.5* 5.3*  CL 103 102 109 113*  CO2 9* 18* 18* 18*  BUN CANCELED 123* 109* 66*  CREATININE 3.38* 3.34* 2.61* 1.82*  CALCIUM 9.6 9.1 8.6* 9.2  PROT 7.0 7.3  --   --   BILITOT 0.4 0.9  --   --   ALKPHOS 70 59  --   --   ALT 11 13*  --   --   AST 18 18  --   --   GLUCOSE 102* 100* 103* 99    Imaging/Diagnostic Tests: Dg Chest 2 View  Result Date: 07/28/2017 CLINICAL DATA:  Chest pain.  Hypotension.  Leukocytosis. EXAM: CHEST - 2 VIEW COMPARISON:  08/15/2012 FINDINGS: Sequelae of prior sternotomy are again identified. The cardiomediastinal silhouette is within normal limits. Aortic atherosclerosis is noted. No airspace consolidation, edema, pleural effusion, pneumothorax is identified. The posterior costophrenic angles were incompletely imaged. Prior cervical spine fusion is noted. IMPRESSION: No active cardiopulmonary disease. Electronically Signed   By: Logan Bores M.D.   On: 07/28/2017 17:01     Verner Mould, MD 07/30/2017, 6:55 AM PGY-3, [place] Intern pager: [phone], text pages welcome

## 2017-07-30 LAB — CBC
HCT: 39.2 % (ref 39.0–52.0)
HEMOGLOBIN: 12.8 g/dL — AB (ref 13.0–17.0)
MCH: 31.4 pg (ref 26.0–34.0)
MCHC: 32.7 g/dL (ref 30.0–36.0)
MCV: 96.1 fL (ref 78.0–100.0)
Platelets: 307 10*3/uL (ref 150–400)
RBC: 4.08 MIL/uL — AB (ref 4.22–5.81)
RDW: 14.5 % (ref 11.5–15.5)
WBC: 18.1 10*3/uL — AB (ref 4.0–10.5)

## 2017-07-30 LAB — BASIC METABOLIC PANEL
Anion gap: 9 (ref 5–15)
BUN: 66 mg/dL — AB (ref 6–20)
CHLORIDE: 113 mmol/L — AB (ref 101–111)
CO2: 18 mmol/L — ABNORMAL LOW (ref 22–32)
Calcium: 9.2 mg/dL (ref 8.9–10.3)
Creatinine, Ser: 1.82 mg/dL — ABNORMAL HIGH (ref 0.61–1.24)
GFR, EST AFRICAN AMERICAN: 38 mL/min — AB (ref >60)
GFR, EST NON AFRICAN AMERICAN: 33 mL/min — AB (ref >60)
Glucose, Bld: 99 mg/dL (ref 65–99)
POTASSIUM: 5.3 mmol/L — AB (ref 3.5–5.1)
SODIUM: 140 mmol/L (ref 135–145)

## 2017-07-30 LAB — PROTIME-INR
INR: 4.13 — AB
PROTHROMBIN TIME: 39.7 s — AB (ref 11.4–15.2)

## 2017-07-30 MED ORDER — ENSURE ENLIVE PO LIQD
237.0000 mL | Freq: Two times a day (BID) | ORAL | Status: DC
  Administered 2017-07-30 – 2017-08-01 (×4): 237 mL via ORAL

## 2017-07-30 NOTE — Progress Notes (Signed)
FPTS Interim Progress Note:   Called wife to update on patient per her request to admitting team. She has stopped by the hospital, but missed Dr. Erin Hearing this morning.Explained that we have preliminary culture data, and it does look like he had a UTI.  Explained that we will likely know the specific bacteria tomorrow and transition to oral antibiotics if possible at that time.  Additionally, explained that the kidney injury is improving.  Patient told his wife that he may be able to go back to claps tomorrow, I explained that this may depend on how well he is doing and how his cultures grew out.  She requested that he be changed to a regular diet, reports he was cleared for such after recent discharge.  I will do this.  She states that he also likes Ensure at the nursing home, and I ordered chocolate Ensure per their request.  Additionally, discussed goals of care with her as well.  She states that he recently told she and her family that he would not want to be "kept on life support."  However, she would like all temporary resuscitative measures to be done.  She appreciates our care.  Ralene Ok, MD

## 2017-07-30 NOTE — Discharge Instructions (Signed)

## 2017-07-30 NOTE — Progress Notes (Signed)
ANTICOAGULATION CONSULT NOTE - Initial Consult  Pharmacy Consult for Warfarin  Indication: Mechanical AVR  No Known Allergies   Vital Signs: Temp: 98.2 F (36.8 C) (03/30 2036) Temp Source: Oral (03/30 2036) BP: 148/91 (03/30 2036) Pulse Rate: 78 (03/30 2036)  Labs: Recent Labs    07/28/17 1618 07/28/17 1728 07/29/17 0708 07/30/17 0448  HGB 13.4  --  12.5* 12.8*  HCT 40.2  --  38.2* 39.2  PLT 299  --  307 307  LABPROT  --  37.7* 35.9* 39.7*  INR  --  3.86 3.64 4.13*  CREATININE 3.34*  --  2.61* 1.82*    Estimated Creatinine Clearance: 27.8 mL/min (A) (by C-G formula based on SCr of 1.82 mg/dL (H)).   Medical History: Past Medical History:  Diagnosis Date  . Arthritis 02-15-12   osteoarthritis,right knee, pain right wrist.,  . Arthritis    "hands, legs" (12/01/2016)  . Carotid artery disease ([place])    Carotid US 1/18: R 40-59; L 80-99  . Chronic lower back pain   . Complication of anesthesia 02-15-12   very confused, and very slow to awaken after anesthesia  . Coronary artery disease 02-15-12   Heart valve replaced   . Depression   . Hypercholesterolemia 02-15-12  . Hypertension   . Memory loss   . PAD (peripheral artery disease) ([place])   . S/P AVR (aortic valve replacement)    Mechanical St. Jude AVR // Echo 12/17: Moderate LVH, EF 60-65, normal wall motion, grade 1 diastolic dysfunction, mechanical AVR okay with mean gradient 8 mmHg, MAC, mild mitral stenosis (mean gradient 2 mmHg), mild MR, mild RAE, PASP 25  . Urinary frequency     Assessment: 84 y/o M on coumadin 7.50m daily PTA for mechanical aortic valve (1993). INR on admit is 3/86. Although last anticoag note on 2/26 states dose is 526mdaily exc 2.58m41mn Thurs/Sun with a therapeutic INR. Was recently on bridge with Lovenox after surgery. INR 3.8 on admit. INR back down to 3.64 yesterday after holding a dose but now back up to 4.13 today. Hgb and plts ok.  Goal of Therapy:  INR 2.5-3.5 Monitor  platelets by anticoagulation protocol: Yes   Plan:  Hold Coumadin tonight Monitor daily INR, CBC, s/s of bleed  Carel Carrier J 07/30/2017,7:41 AM

## 2017-07-31 DIAGNOSIS — N390 Urinary tract infection, site not specified: Secondary | ICD-10-CM

## 2017-07-31 DIAGNOSIS — F0280 Dementia in other diseases classified elsewhere without behavioral disturbance: Secondary | ICD-10-CM

## 2017-07-31 DIAGNOSIS — G309 Alzheimer's disease, unspecified: Secondary | ICD-10-CM

## 2017-07-31 DIAGNOSIS — R791 Abnormal coagulation profile: Secondary | ICD-10-CM

## 2017-07-31 DIAGNOSIS — R319 Hematuria, unspecified: Secondary | ICD-10-CM

## 2017-07-31 LAB — BASIC METABOLIC PANEL
ANION GAP: 9 (ref 5–15)
BUN: 41 mg/dL — ABNORMAL HIGH (ref 6–20)
CALCIUM: 9 mg/dL (ref 8.9–10.3)
CO2: 17 mmol/L — ABNORMAL LOW (ref 22–32)
Chloride: 114 mmol/L — ABNORMAL HIGH (ref 101–111)
Creatinine, Ser: 1.44 mg/dL — ABNORMAL HIGH (ref 0.61–1.24)
GFR calc non Af Amer: 43 mL/min — ABNORMAL LOW (ref >60)
GFR, EST AFRICAN AMERICAN: 50 mL/min — AB (ref >60)
Glucose, Bld: 95 mg/dL (ref 65–99)
Potassium: 4.5 mmol/L (ref 3.5–5.1)
Sodium: 140 mmol/L (ref 135–145)

## 2017-07-31 LAB — CBC
HCT: 36.2 % — ABNORMAL LOW (ref 39.0–52.0)
HEMOGLOBIN: 11.9 g/dL — AB (ref 13.0–17.0)
MCH: 31.4 pg (ref 26.0–34.0)
MCHC: 32.9 g/dL (ref 30.0–36.0)
MCV: 95.5 fL (ref 78.0–100.0)
Platelets: 294 10*3/uL (ref 150–400)
RBC: 3.79 MIL/uL — AB (ref 4.22–5.81)
RDW: 14.8 % (ref 11.5–15.5)
WBC: 16.8 10*3/uL — AB (ref 4.0–10.5)

## 2017-07-31 LAB — URINE CULTURE: Culture: >=100000 — AB

## 2017-07-31 LAB — PROTIME-INR
INR: 5.6
PROTHROMBIN TIME: 50.4 s — AB (ref 11.4–15.2)

## 2017-07-31 MED ORDER — MEMANTINE HCL 10 MG PO TABS
5.0000 mg | ORAL_TABLET | Freq: Two times a day (BID) | ORAL | Status: DC
  Administered 2017-07-31 – 2017-08-01 (×2): 5 mg via ORAL
  Filled 2017-07-31 (×2): qty 1

## 2017-07-31 MED ORDER — CEFDINIR 250 MG/5ML PO SUSR: 300.0000 mg | Freq: Two times a day (BID) | ORAL | Status: DC

## 2017-07-31 MED ORDER — CEFDINIR 300 MG PO CAPS
300.0000 mg | ORAL_CAPSULE | Freq: Two times a day (BID) | ORAL | Status: DC
  Administered 2017-08-01: 300 mg via ORAL
  Filled 2017-07-31 (×3): qty 1

## 2017-07-31 NOTE — Progress Notes (Signed)
Family Medicine Teaching Service Daily Progress Note Intern Pager: [phone]  Patient name: Juan Li Medical record number: [id number] Date of birth: [birth year] Age: 84 y.o. Gender: male  Primary Care Provider: Zenia Resides, MD Consultants: None Code Status: FULL  Pt Overview and Major Events to Date:  03/29 Admitted to FPTS  Assessment and Plan: Juan Li is a 84 y.o. male presenting with leukocytosis and hypotension. PMH is significant for Alzheimer's disease, HTN, HLD, chronic pain, and recent C5-C6discectomy on 3/11, CKD3, OA, hx of mechanical heart valve replacement (on coumadin and ASA), HFpEF, Vitamin D Deficiency, PVD and CAD.   Sepsis due to UTI: Clinically improving. BP normotensive overnight, hypertensive today, now s/p IVF. Lactic acidosis resolved, leukocytosis improving (16.8, 4/1). UA c/w UTI, urine culture positive for >100k Ecoli and >100k Enterobacter cloacae.  - Blood cx NGx48hr - Urine cx - >100k Ecoli, >100k Enterobacter cloacae.  - d/c IVF   - transition to Cefdinir today. S/p CTX (3/29-3/31).  CDiff Colitis: Confirmatory testing performed at SNF. Started on PO vancomycin 250mg  QID at the nursing home on 3/25. 1 unmeasured stool in the last 24 hours. - PO vancomycin 125mg  q 6 hr   Hyperkalemia, resolved 5.5>4.5  Non-anion gap metabolic Acidosis, resolved: Likely due to diarrhea. Improving after IVF hydration.  - Monitor   AKI on CKD3: Likely pre-renal in etiology due to his diarrhea. Baseline Cr 1.4-1.5. Cr 3.34 on admission. Cr improved further to 1.44 this AM (04/01).   - d/c IVF as back to baseline - Strict intake and output - Avoid nephrotoxic medications  S/p C5-C6discectomy on 3/11: Discectomy at C5-6 with decompression of cervical stenosis with myelopathy, by Dr. Kathyrn Sheriff on 07/11/17. Has C-spine collar.   - Tylenol PRN pain  - ok to remove collar for meals and showers per nursing home.  AMS I Alzheimer's Dementia: Unclear  baseline. Per nursing home, his mental status has been the same since his admission. Nursing home reports he is oriented to self, gets confused easily, and has difficulty with short term memory. Wife reports of issues with short term memory. It seems that this might be his baseline after speaking to the above resources. No focal neurological deficits.  - Continue home Aricept 5mg  qhs  - will increase Namenda dose back to 5 mg BID, now that AKI is resolved.  Chronic HFpEF: Stable. Last ECHO 04/21/16 with EF of 60-65% with G1DD.  - monitor.   PVD I Carotid Artery Disease:   - ASA 81mg   - Pravastatin 40mg  daily   H/o Mechanical aortic valve replacement: On ASA and Coumadin. INR supra-therapeutic at 5.60.  - Hold coumadin, Coumadin per pharmacy - obtain daily INR  Hypertension: Currently holding home medication due to initial sepsis: Enalapril 20mg  BID, HCTZ 25 mg daily. Normotensive overnight, therefore can continue to hold meds. - Monitor  BPH:  - Continue home Proscar 5mg  daily and Flomax 0.4mg  daily  - Holding home Mirabegron   FEN/GI: soft diet Prophylaxis: elevated INR, on coumadin at home   Subjective:  Patient feels well today. Denies abdominal pain, N/V. Does not remember how many stools he has had.  Objective: Temp:  [97.4 F (36.3 C)-98 F (36.7 C)] 98 F (36.7 C) (03/31 2118) Pulse Rate:  [55-77] 77 (03/31 2118) Resp:  [19] 19 (03/31 2118) BP: (146-151)/(60-111) 151/60 (03/31 2118) SpO2:  [98 %-99 %] 98 % (03/31 2118)   Physical Exam: General: elderly male lying in bed, in NAD; cervical collar in place  Cardiovascular: RRR, mechanical valve click heard, no murmurs appreciated Respiratory: CTAB, normal WOB on RA Abdomen: soft, nondistended, slightly tender to palpation diffusely, +BS Extremities: moving all spontaneously Neuro: oriented to person, place, and time Psych: appropriate mood and affect  Laboratory: Recent Labs  Lab 07/28/17 1618 07/29/17 0708  07/30/17 0448  WBC 19.2* 18.9* 18.1*  HGB 13.4 12.5* 12.8*  HCT 40.2 38.2* 39.2  PLT 299 307 307   Recent Labs  Lab 07/27/17 1239 07/28/17 1618 07/29/17 0708 07/30/17 0448  NA 141 133* 136 140  K 5.4* 4.8 5.5* 5.3*  CL 103 102 109 113*  CO2 9* 18* 18* 18*  BUN CANCELED 123* 109* 66*  CREATININE 3.38* 3.34* 2.61* 1.82*  CALCIUM 9.6 9.1 8.6* 9.2  PROT 7.0 7.3  --   --   BILITOT 0.4 0.9  --   --   ALKPHOS 70 59  --   --   ALT 11 13*  --   --   AST 18 18  --   --   GLUCOSE 102* 100* 103* 99    Imaging/Diagnostic Tests: Dg Chest 2 View  Result Date: 07/28/2017 CLINICAL DATA:  Chest pain.  Hypotension.  Leukocytosis. EXAM: CHEST - 2 VIEW COMPARISON:  08/15/2012 FINDINGS: Sequelae of prior sternotomy are again identified. The cardiomediastinal silhouette is within normal limits. Aortic atherosclerosis is noted. No airspace consolidation, edema, pleural effusion, pneumothorax is identified. The posterior costophrenic angles were incompletely imaged. Prior cervical spine fusion is noted. IMPRESSION: No active cardiopulmonary disease. Electronically Signed   By: Logan Bores M.D.   On: 07/28/2017 17:01   Rory Percy, DO 07/31/2017, 7:14 AM PGY-1, [place] Intern pager: [phone], text pages welcome

## 2017-07-31 NOTE — Progress Notes (Signed)
ANTICOAGULATION CONSULT NOTE - Initial Consult  Pharmacy Consult for Warfarin  Indication: Mechanical AVR  No Known Allergies   Vital Signs: Temp: 98 F (36.7 C) (03/31 2118) Temp Source: Axillary (03/31 2118) BP: 151/60 (03/31 2118) Pulse Rate: 77 (03/31 2118)  Labs: Recent Labs    07/28/17 1618  07/29/17 0708 07/30/17 0448 07/31/17 0500  HGB 13.4  --  12.5* 12.8*  --   HCT 40.2  --  38.2* 39.2  --   PLT 299  --  307 307  --   LABPROT  --    < > 35.9* 39.7* 50.4*  INR  --    < > 3.64 4.13* 5.60*  CREATININE 3.34*  --  2.61* 1.82*  --    < > = values in this interval not displayed.    Estimated Creatinine Clearance: 27.8 mL/min (A) (by C-G formula based on SCr of 1.82 mg/dL (H)).   Medical History: Past Medical History:  Diagnosis Date  . Arthritis 02-15-12   osteoarthritis,right knee, pain right wrist.,  . Arthritis    "hands, legs" (12/01/2016)  . Carotid artery disease ([place])    Carotid US 1/18: R 40-59; L 80-99  . Chronic lower back pain   . Complication of anesthesia 02-15-12   very confused, and very slow to awaken after anesthesia  . Coronary artery disease 02-15-12   Heart valve replaced   . Depression   . Hypercholesterolemia 02-15-12  . Hypertension   . Memory loss   . PAD (peripheral artery disease) ([place])   . S/P AVR (aortic valve replacement)    Mechanical St. Jude AVR // Echo 12/17: Moderate LVH, EF 60-65, normal wall motion, grade 1 diastolic dysfunction, mechanical AVR okay with mean gradient 8 mmHg, MAC, mild mitral stenosis (mean gradient 2 mmHg), mild MR, mild RAE, PASP 25  . Urinary frequency     Assessment: 84 y/o M on coumadin 7.879m daily PTA for mechanical aortic valve (1993). INR on admit is 3/86. Although last anticoag note on 2/26 states dose is 5579mdaily exc 2.79m51mn Thurs/Sun with a therapeutic INR. Was recently on bridge with Lovenox after surgery. INR 3.8 on admit.INR up to 4.13 yesterday and up again to 5.60 today.  Goal of  Therapy:  INR 2.5-3.5 Monitor platelets by anticoagulation protocol: Yes   Plan:  Hold Coumadin tonight Monitor daily INR, CBC, s/s of bleed  Aibhlinn Kalmar A [facility]armacist Pager: [phone]/2019,7:14 AM

## 2017-07-31 NOTE — Progress Notes (Signed)
Nurse got a call from the lab about the patient INR this morning being 5.60, attending doctor notified, patient alert and oriented , resting in bed now, call light within reach, will continue to monitor.

## 2017-07-31 NOTE — Discharge Summary (Signed)
[facility] Discharge Summary  Patient name: Juan Li Medical record number: [id number] Date of birth: Aug 13, 1931 Age: 84 y.o. Gender: male Date of Admission: 07/28/2017  Date of Discharge:  08/01/2017 Admitting Physician: Lind Covert, MD  Primary Care Provider: Zenia Resides, MD Consultants: None  Indication for Hospitalization: hypotension, leukocytosis  Discharge Diagnoses/Problem List:  Urosepsis, improved Cdiff colitis AKI on CKD3, resolved S/p C5-C6 discectomy Alzheimer's dementia, stable Chronic HFpEF, stable CAD, stable HTN, stable BPH, stable  Disposition: back to SNF  Discharge Condition: improved  Discharge Exam:  Temp:  [98.2 F (36.8 C)-98.5 F (36.9 C)] 98.2 F (36.8 C) (04/02 0719) Pulse Rate:  [69-83] 83 (04/02 0719) Resp:  [16-18] 16 (04/02 0719) BP: (117-163)/(69-88) 163/88 (04/02 0719) SpO2:  [95 %-99 %] 99 % (04/02 0719)   Physical Exam: Copied from progress note on day of discharge General: elderly male lying in bed, in NAD; cervical collar in place Cardiovascular: RRR, mechanical valve click heard, no murmurs appreciated Respiratory: CTAB, normal WOB on RA Abdomen: soft, nondistended, nontender to palpation, +BS Extremities: moving all spontaneously Neuro: oriented to person, place, and time Psych: appropriate mood and affect    Brief Hospital Course:  Zain Lankford Purgasonis a 84 y.o.malewith PMH significant for Alzheimer's disease,HTN,HLD,chronic pain, and recent C5-C6discectomy on 3/11, CKD3, OA, hx of mechanical heart valve replacement (on coumadin and ASA), HFpEF, Vitamin D Deficiency, PVD and CAD, who presented meeting sepsis criteria with leukocytosis and hypotension and U/A consistent with UTI. Also noted to have AKI with lactic acidosis. In the ED, patient was given IVF bolus and started on ceftriaxone after obtaining blood and urine cultures. Blood cultures remained no growth >48hrs, urine  cultures grew >100k colonies Ecoli and >100k colonies Enterobacter cloacae. Antibiotics were transitioned to PO Cefdinir with good response. AKI resolved prior to discharge.  Patient was noted to be Cdiff positive at nursing home and started on PO Vancomycin prior to presentation. Patient continued on PO Vanc during hospitalization and will continue therapy on discharge.  Issues for Follow Up:  1. Medication Changes: 1. Continue Cefdinir to complete a 14 day course (last dose 4/11) 2. Patient followed with Neurosurgery for C5-C6 discectomy 07/10/2017. Cervical collar in place. Has follow up appointment 4/18. 3. Follow up blood pressure - HCTZ and enalapril held at discharge, restart as needed for hypertension. 4. Supertherapeutic INR - INR elevated at 4.76 on the day of discharge. Please hold warfarin on 4/2. Please recheck INR daily and restart warfarin when INR reaches therapeutic range.  Significant Procedures: None  Significant Labs and Imaging:  Recent Labs  Lab 07/30/17 0448 07/31/17 0754 08/01/17 0819  WBC 18.1* 16.8* 15.1*  HGB 12.8* 11.9* 12.3*  HCT 39.2 36.2* 36.1*  PLT 307 294 305   Recent Labs  Lab 07/27/17 1239 07/28/17 1618 07/29/17 0708 07/30/17 0448 07/31/17 0754 08/01/17 0819  NA 141 133* 136 140 140 136  K 5.4* 4.8 5.5* 5.3* 4.5 4.5  CL 103 102 109 113* 114* 110  CO2 9* 18* 18* 18* 17* 18*  GLUCOSE 102* 100* 103* 99 95 116*  BUN CANCELED 123* 109* 66* 41* 30*  CREATININE 3.38* 3.34* 2.61* 1.82* 1.44* 1.33*  CALCIUM 9.6 9.1 8.6* 9.2 9.0 9.1  ALKPHOS 70 59  --   --   --   --   AST 18 18  --   --   --   --   ALT 11 13*  --   --   --   --  ALBUMIN 4.4 3.7  --   --   --   --    3/29 CXR FINDINGS: Sequelae of prior sternotomy are again identified. The cardiomediastinal silhouette is within normal limits. Aortic atherosclerosis is noted. No airspace consolidation, edema, pleural effusion, pneumothorax is identified. The posterior costophrenic angles were  incompletely imaged. Prior cervical spine fusion is noted. IMPRESSION: No active cardiopulmonary disease.   Results/Tests Pending at Time of Discharge:  None  Discharge Medications:  Allergies as of 08/01/2017   No Known Allergies     Medication List    STOP taking these medications   acetaminophen 500 MG tablet Commonly known as:  TYLENOL   aspirin EC 81 MG tablet   colchicine 0.6 MG tablet   enoxaparin 80 MG/0.8ML injection Commonly known as:  LOVENOX   hydrochlorothiazide 25 MG tablet Commonly known as:  HYDRODIURIL   SENNA-PLUS 8.6-50 MG tablet Generic drug:  senna-docusate   VASOTEC 20 MG tablet Generic drug:  enalapril   warfarin 5 MG tablet Commonly known as:  COUMADIN   warfarin 7.5 MG tablet Commonly known as:  COUMADIN     TAKE these medications   amLODipine 10 MG tablet Commonly known as:  NORVASC TAKE 1 TABLET BY MOUTH EVERY DAY What changed:    how much to take  how to take this  when to take this   cefdinir 300 MG capsule Commonly known as:  OMNICEF Take 1 capsule (300 mg total) by mouth every 12 (twelve) hours for 10 days.   donepezil 5 MG tablet Commonly known as:  ARICEPT Take 1 tablet (5 mg total) by mouth daily. What changed:  when to take this   feeding supplement (BOOST BREEZE) Liqd Take 1 Can by mouth 2 (two) times daily.   finasteride 5 MG tablet Commonly known as:  PROSCAR TAKE 1 TABLET (5 MG TOTAL) BY MOUTH DAILY.   memantine 5 MG tablet Commonly known as:  NAMENDA Take 1 tablet (5 mg total) by mouth 2 (two) times daily.   mirabegron ER 50 MG Tb24 tablet Commonly known as:  MYRBETRIQ Take 1 tablet (50 mg total) by mouth daily.   potassium chloride SA 20 MEQ tablet Commonly known as:  K-DUR,KLOR-CON Take 20 mEq by mouth every other day.   pravastatin 40 MG tablet Commonly known as:  PRAVACHOL Take 1 tablet (40 mg total) by mouth every evening. What changed:  when to take this   saccharomyces boulardii 250 MG  capsule Commonly known as:  FLORASTOR Take 250 mg by mouth 2 (two) times daily. 20 day course started 07/18/17   tamsulosin 0.4 MG Caps capsule Commonly known as:  FLOMAX TAKE ONE CAPSULE BY MOUTH EVERY DAY What changed:    how much to take  how to take this  when to take this   vancomycin 250 MG capsule Commonly known as:  VANCOCIN Take 250 mg by mouth 4 (four) times daily. 10 day course started 07/24/17   Vitamin D (Ergocalciferol) 50000 units Caps capsule Commonly known as:  DRISDOL Take 50,000 Units by mouth every Wednesday.       Discharge Instructions: Please refer to Patient Instructions section of EMR for full details.  Patient was counseled important signs and symptoms that should prompt return to medical care, changes in medications, dietary instructions, activity restrictions, and follow up appointments.   Follow-Up Appointments: Follow-up Information    End, Harrell Gave, MD .   Specialty:  Cardiology Contact information: [place]  [place] [address] [phone]           Alison Rumball, DO PGY1  Everrett Coombe, MD 08/01/2017, 2:27 PM PGY-2, [place]

## 2017-08-01 ENCOUNTER — Other Ambulatory Visit: Payer: Self-pay

## 2017-08-01 ENCOUNTER — Encounter (HOSPITAL_COMMUNITY): Payer: Self-pay | Admitting: General Practice

## 2017-08-01 DIAGNOSIS — R6520 Severe sepsis without septic shock: Secondary | ICD-10-CM | POA: Diagnosis not present

## 2017-08-01 DIAGNOSIS — F0390 Unspecified dementia without behavioral disturbance: Secondary | ICD-10-CM | POA: Diagnosis not present

## 2017-08-01 DIAGNOSIS — Z711 Person with feared health complaint in whom no diagnosis is made: Secondary | ICD-10-CM | POA: Diagnosis not present

## 2017-08-01 DIAGNOSIS — Y999 Unspecified external cause status: Secondary | ICD-10-CM | POA: Diagnosis not present

## 2017-08-01 DIAGNOSIS — I2510 Atherosclerotic heart disease of native coronary artery without angina pectoris: Secondary | ICD-10-CM | POA: Diagnosis not present

## 2017-08-01 DIAGNOSIS — R2689 Other abnormalities of gait and mobility: Secondary | ICD-10-CM | POA: Diagnosis not present

## 2017-08-01 DIAGNOSIS — Z7901 Long term (current) use of anticoagulants: Secondary | ICD-10-CM | POA: Diagnosis not present

## 2017-08-01 DIAGNOSIS — R41841 Cognitive communication deficit: Secondary | ICD-10-CM | POA: Diagnosis not present

## 2017-08-01 DIAGNOSIS — M542 Cervicalgia: Secondary | ICD-10-CM | POA: Diagnosis not present

## 2017-08-01 DIAGNOSIS — W06XXXA Fall from bed, initial encounter: Secondary | ICD-10-CM | POA: Diagnosis not present

## 2017-08-01 DIAGNOSIS — R269 Unspecified abnormalities of gait and mobility: Secondary | ICD-10-CM | POA: Diagnosis not present

## 2017-08-01 DIAGNOSIS — S199XXA Unspecified injury of neck, initial encounter: Secondary | ICD-10-CM | POA: Diagnosis not present

## 2017-08-01 DIAGNOSIS — R4182 Altered mental status, unspecified: Secondary | ICD-10-CM | POA: Diagnosis not present

## 2017-08-01 DIAGNOSIS — I6523 Occlusion and stenosis of bilateral carotid arteries: Secondary | ICD-10-CM | POA: Diagnosis not present

## 2017-08-01 DIAGNOSIS — M6281 Muscle weakness (generalized): Secondary | ICD-10-CM | POA: Diagnosis not present

## 2017-08-01 DIAGNOSIS — M4712 Other spondylosis with myelopathy, cervical region: Secondary | ICD-10-CM | POA: Diagnosis not present

## 2017-08-01 DIAGNOSIS — Z96651 Presence of right artificial knee joint: Secondary | ICD-10-CM | POA: Diagnosis not present

## 2017-08-01 DIAGNOSIS — N3001 Acute cystitis with hematuria: Secondary | ICD-10-CM

## 2017-08-01 DIAGNOSIS — M6389 Disorders of muscle in diseases classified elsewhere, multiple sites: Secondary | ICD-10-CM | POA: Diagnosis not present

## 2017-08-01 DIAGNOSIS — A0472 Enterocolitis due to Clostridium difficile, not specified as recurrent: Secondary | ICD-10-CM | POA: Diagnosis not present

## 2017-08-01 DIAGNOSIS — Z79899 Other long term (current) drug therapy: Secondary | ICD-10-CM | POA: Diagnosis not present

## 2017-08-01 DIAGNOSIS — Y939 Activity, unspecified: Secondary | ICD-10-CM | POA: Diagnosis not present

## 2017-08-01 DIAGNOSIS — Z9889 Other specified postprocedural states: Secondary | ICD-10-CM | POA: Diagnosis not present

## 2017-08-01 DIAGNOSIS — R791 Abnormal coagulation profile: Secondary | ICD-10-CM | POA: Diagnosis not present

## 2017-08-01 DIAGNOSIS — G3281 Cerebellar ataxia in diseases classified elsewhere: Secondary | ICD-10-CM | POA: Diagnosis not present

## 2017-08-01 DIAGNOSIS — N400 Enlarged prostate without lower urinary tract symptoms: Secondary | ICD-10-CM | POA: Diagnosis not present

## 2017-08-01 DIAGNOSIS — R2681 Unsteadiness on feet: Secondary | ICD-10-CM | POA: Diagnosis not present

## 2017-08-01 DIAGNOSIS — G309 Alzheimer's disease, unspecified: Secondary | ICD-10-CM | POA: Diagnosis not present

## 2017-08-01 DIAGNOSIS — Z87891 Personal history of nicotine dependence: Secondary | ICD-10-CM | POA: Diagnosis not present

## 2017-08-01 DIAGNOSIS — E440 Moderate protein-calorie malnutrition: Secondary | ICD-10-CM | POA: Diagnosis not present

## 2017-08-01 DIAGNOSIS — Z4889 Encounter for other specified surgical aftercare: Secondary | ICD-10-CM | POA: Diagnosis not present

## 2017-08-01 DIAGNOSIS — A4189 Other specified sepsis: Secondary | ICD-10-CM | POA: Diagnosis not present

## 2017-08-01 DIAGNOSIS — I10 Essential (primary) hypertension: Secondary | ICD-10-CM | POA: Diagnosis not present

## 2017-08-01 DIAGNOSIS — F3289 Other specified depressive episodes: Secondary | ICD-10-CM | POA: Diagnosis not present

## 2017-08-01 DIAGNOSIS — R278 Other lack of coordination: Secondary | ICD-10-CM | POA: Diagnosis not present

## 2017-08-01 DIAGNOSIS — N179 Acute kidney failure, unspecified: Secondary | ICD-10-CM | POA: Diagnosis not present

## 2017-08-01 DIAGNOSIS — Y92122 Bedroom in nursing home as the place of occurrence of the external cause: Secondary | ICD-10-CM | POA: Diagnosis not present

## 2017-08-01 LAB — PROTIME-INR
INR: 4.76
Prothrombin Time: 44.3 seconds — ABNORMAL HIGH (ref 11.4–15.2)

## 2017-08-01 LAB — BASIC METABOLIC PANEL
Anion gap: 8 (ref 5–15)
BUN: 30 mg/dL — AB (ref 6–20)
CHLORIDE: 110 mmol/L (ref 101–111)
CO2: 18 mmol/L — AB (ref 22–32)
CREATININE: 1.33 mg/dL — AB (ref 0.61–1.24)
Calcium: 9.1 mg/dL (ref 8.9–10.3)
GFR calc Af Amer: 55 mL/min — ABNORMAL LOW (ref >60)
GFR calc non Af Amer: 48 mL/min — ABNORMAL LOW (ref >60)
Glucose, Bld: 116 mg/dL — ABNORMAL HIGH (ref 65–99)
Potassium: 4.5 mmol/L (ref 3.5–5.1)
SODIUM: 136 mmol/L (ref 135–145)

## 2017-08-01 LAB — CBC
HCT: 36.1 % — ABNORMAL LOW (ref 39.0–52.0)
HEMOGLOBIN: 12.3 g/dL — AB (ref 13.0–17.0)
MCH: 32.2 pg (ref 26.0–34.0)
MCHC: 34.1 g/dL (ref 30.0–36.0)
MCV: 94.5 fL (ref 78.0–100.0)
Platelets: 305 10*3/uL (ref 150–400)
RBC: 3.82 MIL/uL — ABNORMAL LOW (ref 4.22–5.81)
RDW: 14.3 % (ref 11.5–15.5)
WBC: 15.1 10*3/uL — ABNORMAL HIGH (ref 4.0–10.5)

## 2017-08-01 MED ORDER — CEFDINIR 300 MG PO CAPS: 300.0000 mg | ORAL_CAPSULE | Freq: Two times a day (BID) | ORAL | 0 refills | Status: AC

## 2017-08-01 MED ORDER — CEFDINIR 300 MG PO CAPS: 300.0000 mg | ORAL_CAPSULE | Freq: Two times a day (BID) | ORAL | 0 refills | Status: DC

## 2017-08-01 MED ORDER — TRAMADOL HCL 50 MG PO TABS
50.0000 mg | ORAL_TABLET | Freq: Once | ORAL | Status: AC
  Administered 2017-08-01: 50 mg via ORAL
  Filled 2017-08-01: qty 1

## 2017-08-01 NOTE — Evaluation (Signed)
Physical Therapy Evaluation Patient Details Name: Juan Li MRN: [id number] DOB: [birth year] Today's Date: 08/01/2017   History of Present Illness  Pt is an 84 y/o male admitted from SNF secondary to abnormal labs, hypotension, and AKI. Pt with recent ACDF. PMH includes dementia, CAD, HTN, PAD, s/p AVR.   Clinical Impression  Pt admitted secondary to problem above with deficits below. Pt very unsteady and required mod A for steadying during basic sit<>Stand transfer with use of RW. Pt fatiguing with basic mobility tasks. Recommend return to SNF at d/c to increase safety with mobility prior to return home. Will continue to follow acutely to maximize functional mobility independence and safety.     Follow Up Recommendations SNF;Supervision/Assistance - 24 hour    Equipment Recommendations  None recommended by PT    Recommendations for Other Services       Precautions / Restrictions Precautions Precautions: Fall;Cervical Precaution Booklet Issued: No Precaution Comments: Reviewed cervical precautions with pt.  Required Braces or Orthoses: Cervical Brace Cervical Brace: Hard collar Restrictions Weight Bearing Restrictions: No      Mobility  Bed Mobility Overal bed mobility: Needs Assistance Bed Mobility: Rolling;Sidelying to Sit;Sit to Sidelying Rolling: Mod assist Sidelying to sit: Mod assist;HOB elevated;+2 for safety/equipment     Sit to sidelying: Mod assist General bed mobility comments: Mod A for bed mobility. Required verbal cues for use of log roll technique.   Transfers Overall transfer level: Needs assistance Equipment used: Rolling walker (2 wheeled) Transfers: Sit to/from Stand Sit to Stand: Mod assist         General transfer comment: Verbal cues for safe hand placement, however, pt wanting to reach for bed when standing. Unsteady upon standing with LOB and required mod A to assist with steadying. Pt also very fatigued.   Ambulation/Gait              General Gait Details: unsafe to attempt with 1 person.  Stairs            Wheelchair Mobility    Modified Rankin (Stroke Patients Only)       Balance                                             Pertinent Vitals/Pain Pain Assessment: No/denies pain    Home Living Family/patient expects to be discharged to:: Skilled nursing facility Living Arrangements: Spouse/significant other                    Prior Function Level of Independence: (Unsure)         Comments: When asked about PLOF in SNF, pt unable to remember if he was walking with PT and if he required assist with ADLs. No family present.      Hand Dominance        Extremity/Trunk Assessment   Upper Extremity Assessment Upper Extremity Assessment: Defer to OT evaluation    Lower Extremity Assessment Lower Extremity Assessment: Generalized weakness    Cervical / Trunk Assessment Cervical / Trunk Assessment: Other exceptions Cervical / Trunk Exceptions: Recent ACDF   Communication   Communication: No difficulties  Cognition Arousal/Alertness: Awake/alert Behavior During Therapy: WFL for tasks assessed/performed Overall Cognitive Status: History of cognitive impairments - at baseline  General Comments: per chart review pt with baseline memory loss/dementia       General Comments General comments (skin integrity, edema, etc.): Pt missing foam on chin support of brace. Notified RN and RN to work on getting replacement pads.     Exercises     Assessment/Plan    PT Assessment Patient needs continued PT services  PT Problem List Decreased activity tolerance;Decreased balance;Decreased cognition;Decreased knowledge of use of DME;Pain;Decreased safety awareness;Decreased mobility;Decreased strength;Decreased coordination;Decreased knowledge of precautions       PT Treatment Interventions DME instruction;Therapeutic  activities;Gait training;Therapeutic exercise;Patient/family education;Balance training;Functional mobility training;Cognitive remediation;Neuromuscular re-education    PT Goals (Current goals can be found in the Care Plan section)  Acute Rehab PT Goals Patient Stated Goal: to get better  PT Goal Formulation: With patient Time For Goal Achievement: 08/15/17 Potential to Achieve Goals: Fair    Frequency Min 2X/week   Barriers to discharge        Co-evaluation               AM-PAC PT "6 Clicks" Daily Activity  Outcome Measure Difficulty turning over in bed (including adjusting bedclothes, sheets and blankets)?: Unable Difficulty moving from lying on back to sitting on the side of the bed? : Unable Difficulty sitting down on and standing up from a chair with arms (e.g., wheelchair, bedside commode, etc,.)?: Unable Help needed moving to and from a bed to chair (including a wheelchair)?: A Lot Help needed walking in hospital room?: A Lot Help needed climbing 3-5 steps with a railing? : Total 6 Click Score: 8    End of Session Equipment Utilized During Treatment: Cervical collar;Gait belt Activity Tolerance: Patient limited by fatigue Patient left: in bed;with call bell/phone within reach;with bed alarm set Nurse Communication: Mobility status;Other (comment)(need replacement pads ) PT Visit Diagnosis: Other abnormalities of gait and mobility (R26.89);Muscle weakness (generalized) (M62.81);History of falling (Z91.81);Other symptoms and signs involving the nervous system (R29.898)    Time: 0100-7121 PT Time Calculation (min) (ACUTE ONLY): 18 min   Charges:   PT Evaluation $PT Eval Moderate Complexity: 1 Mod     PT G Codes:        Leighton Ruff, PT, DPT  Acute Rehabilitation Services  Pager: [phone]   Rudean Hitt 08/01/2017, 1:30 PM

## 2017-08-01 NOTE — NC FL2 (Signed)
[place] LEVEL OF CARE SCREENING TOOL     IDENTIFICATION  Patient Name: Juan Li Birthdate: [birth year] Sex: male Admission Date (Current Location): 07/28/2017  [facility] and [place] Number:  Herbalist and Address:  [facility], [place] [address], [place], [place] [address]      Provider Number: [id number]  Attending Physician Name and Address:  Lind Covert, MD  Relative Name and Phone Number:  Toniann Fail, [phone], spouse    Current Level of Care: SNF Recommended Level of Care: [place] Prior Approval Number:    Date Approved/Denied:   PASRR Number: [id number]  Discharge Plan: SNF    Current Diagnoses: Patient Active Problem List   Diagnosis Date Noted  . Elevated INR   . Urinary tract infection with hematuria   . AKI (acute kidney injury) ([place]) 07/28/2017  . Confusion 07/27/2017  . Weakness 07/27/2017  . Cervical myelopathy ([place]) 07/11/2017  . Fall 06/24/2017  . Dizziness 06/24/2017  . (HFpEF) heart failure with preserved ejection fraction ([place]) 06/24/2017  . Peripheral vascular disease ([place]) 12/16/2016  . Acute gout involving toe of left foot 12/16/2016  . Claudication in peripheral vascular disease ([place]) 11/19/2016  . Non-healing wound of lower extremity 11/19/2016  . Cervical spondylosis with myelopathy 01/28/2016  . Metatarsalgia of left foot 07/30/2015  . Encounter for palliative care 11/21/2014  . Carotid artery disease ([place]) 07/18/2014  . Stenosis of cervical spine region 07/18/2014  . Encounter for chronic pain management 04/09/2014  . Bradycardia 10/06/2013  . Unspecified vitamin D deficiency 11/30/2012  . Chronic diastolic CHF (congestive heart failure) ([place]) 08/21/2012  . OA (osteoarthritis) of knee 02/20/2012  . Gait abnormality 09/02/2011  . Aortic valve disorder 06/28/2010  . Long term current use of anticoagulant therapy 06/28/2010  . H/O mechanical  aortic valve replacement 06/28/2010  . AAA 01/20/2010  . HEART VALVE REPLACEMENT, HX OF 12/31/2008  . CKD (chronic kidney disease) stage 3, GFR 30-59 ml/min (HCC) 08/06/2008  . Dementia 05/25/2007  . OSTEOARTHROSIS, GENERALIZED, MULTIPLE SITES 09/18/2006  . HYPERCHOLESTEROLEMIA 06/29/2006  . Gouty arthropathy 06/29/2006  . CARPAL TUNNEL SYNDROME 06/29/2006  . Essential hypertension 06/29/2006  . GASTROESOPHAGEAL REFLUX, NO ESOPHAGITIS 06/29/2006  . BPH (benign prostatic hyperplasia) 06/29/2006  . Skin cancer 06/29/2006  . BACK PAIN, LOW 06/29/2006  . ROTATOR CUFF TENDONITIS 06/29/2006  . TOBACCO USE, QUIT 06/29/2006    Orientation RESPIRATION BLADDER Height & Weight     Self, Time, Situation  Normal Incontinent, External catheter Weight: 164 lb 3.9 oz (74.5 kg) Height:     BEHAVIORAL SYMPTOMS/MOOD NEUROLOGICAL BOWEL NUTRITION STATUS      Continent Diet(See DC Summary)  AMBULATORY STATUS COMMUNICATION OF NEEDS Skin   Extensive Assist Verbally Surgical wounds(Neck surgical wounds)                       Personal Care Assistance Level of Assistance  Bathing, Feeding, Dressing Bathing Assistance: Maximum assistance Feeding assistance: Limited assistance Dressing Assistance: Maximum assistance     Functional Limitations Info  Sight, Hearing, Speech Sight Info: Adequate Hearing Info: Adequate Speech Info: Adequate    SPECIAL CARE FACTORS FREQUENCY  PT (By licensed PT), OT (By licensed OT)     PT Frequency: 5x week OT Frequency: 5x week            Contractures      Additional Factors Info  Code Status, Allergies, Isolation Precautions Code Status Info: Full  Code Allergies Info: No Known Allergies     Isolation Precautions Info: Enteric Precautions     Current Medications (08/01/2017):  This is the current hospital active medication list Current Facility-Administered Medications  Medication Dose Route Frequency Provider Last Rate Last Dose  . acetaminophen  (TYLENOL) tablet 650 mg  650 mg Oral Q6H PRN Shirley, [place], DO   650 mg at 08/01/17 1057   Or  . acetaminophen (TYLENOL) suppository 650 mg  650 mg Rectal Q6H PRN Shirley, [place], DO      . aspirin EC tablet 81 mg  81 mg Oral Daily Smiley Houseman, MD   81 mg at 08/01/17 1036  . cefdinir (OMNICEF) capsule 300 mg  300 mg Oral Q12H Chambliss, Jeb Levering, MD   300 mg at 08/01/17 1034  . donepezil (ARICEPT) tablet 5 mg  5 mg Oral QHS Smiley Houseman, MD   5 mg at 07/31/17 2208  . feeding supplement (ENSURE ENLIVE) (ENSURE ENLIVE) liquid 237 mL  237 mL Oral BID BM Sela Hilding, MD   237 mL at 08/01/17 1043  . finasteride (PROSCAR) tablet 5 mg  5 mg Oral Daily Smiley Houseman, MD   5 mg at 08/01/17 1035  . memantine (NAMENDA) tablet 5 mg  5 mg Oral BID Lovenia Kim, MD   5 mg at 08/01/17 1035  . ondansetron (ZOFRAN) tablet 4 mg  4 mg Oral Q6H PRN Shirley, [place], DO       Or  . ondansetron [facility]) injection 4 mg  4 mg Intravenous Q6H PRN Shirley, [place], DO   4 mg at 07/30/17 2015  . pravastatin (PRAVACHOL) tablet 40 mg  40 mg Oral QHS Smiley Houseman, MD   40 mg at 07/31/17 2208  . tamsulosin (FLOMAX) capsule 0.4 mg  0.4 mg Oral Daily Smiley Houseman, MD   0.4 mg at 08/01/17 1036  . vancomycin (VANCOCIN) 50 mg/mL oral solution 125 mg  125 mg Oral TID AC & HS Smiley Houseman, MD   125 mg at 08/01/17 1311  . Warfarin - Pharmacist Dosing Inpatient   Does not apply q1800 Reginia Naas, [facility]   Stopped at 07/31/17 1800     Discharge Medications: Please see discharge summary for a list of discharge medications.  Relevant Imaging Results:  Relevant Lab Results:   Additional Information SS#: [id number]  [place], LCSW

## 2017-08-01 NOTE — Progress Notes (Signed)
CRITICAL VALUE ALERT  Critical Value:  INR 4.76  Date & Time Notied:  08/01/17 @0711   Provider Notified: on call   Orders Received/Actions taken: awaking orders

## 2017-08-01 NOTE — Final Progress Note (Signed)
NURSING PROGRESS NOTE  Juan Li [id number] Discharge Data: 08/01/2017 4:41 PM Attending Provider: Lind Covert, MD [id number], Juan Collin, MD     Juan Li to be D/C'd Skilled nursing facility [facility] in [place] per MD order.   All IV's discontinued with no bleeding noted. Report was called and given to Juan Li, Therapist, sports at [employer].  Last Vital Signs:  Blood pressure (!) 150/75, pulse 68, temperature 98.2 F (36.8 C), temperature source Oral, resp. rate 12, weight 74.5 kg (164 lb 3.9 oz), SpO2 100 %.  Discharge Medication List Allergies as of 08/01/2017   No Known Allergies     Medication List    STOP taking these medications   acetaminophen 500 MG tablet Commonly known as:  TYLENOL   aspirin EC 81 MG tablet   colchicine 0.6 MG tablet   enoxaparin 80 MG/0.8ML injection Commonly known as:  LOVENOX   hydrochlorothiazide 25 MG tablet Commonly known as:  HYDRODIURIL   SENNA-PLUS 8.6-50 MG tablet Generic drug:  senna-docusate   VASOTEC 20 MG tablet Generic drug:  enalapril   warfarin 5 MG tablet Commonly known as:  COUMADIN   warfarin 7.5 MG tablet Commonly known as:  COUMADIN     TAKE these medications   amLODipine 10 MG tablet Commonly known as:  NORVASC TAKE 1 TABLET BY MOUTH EVERY DAY What changed:    how much to take  how to take this  when to take this   cefdinir 300 MG capsule Commonly known as:  OMNICEF Take 1 capsule (300 mg total) by mouth every 12 (twelve) hours for 10 days.   donepezil 5 MG tablet Commonly known as:  ARICEPT Take 1 tablet (5 mg total) by mouth daily. What changed:  when to take this   feeding supplement (BOOST BREEZE) Liqd Take 1 Can by mouth 2 (two) times daily.   finasteride 5 MG tablet Commonly known as:  PROSCAR TAKE 1 TABLET (5 MG TOTAL) BY MOUTH DAILY.   memantine 5 MG tablet Commonly known as:  NAMENDA Take 1 tablet (5 mg total) by mouth 2 (two) times daily.   mirabegron ER 50 MG Tb24  tablet Commonly known as:  MYRBETRIQ Take 1 tablet (50 mg total) by mouth daily.   potassium chloride SA 20 MEQ tablet Commonly known as:  K-DUR,KLOR-CON Take 20 mEq by mouth every other day.   pravastatin 40 MG tablet Commonly known as:  PRAVACHOL Take 1 tablet (40 mg total) by mouth every evening. What changed:  when to take this   saccharomyces boulardii 250 MG capsule Commonly known as:  FLORASTOR Take 250 mg by mouth 2 (two) times daily. 20 day course started 07/18/17   tamsulosin 0.4 MG Caps capsule Commonly known as:  FLOMAX TAKE ONE CAPSULE BY MOUTH EVERY DAY What changed:    how much to take  how to take this  when to take this   vancomycin 250 MG capsule Commonly known as:  VANCOCIN Take 250 mg by mouth 4 (four) times daily. 10 day course started 07/24/17   Vitamin D (Ergocalciferol) 50000 units Caps capsule Commonly known as:  DRISDOL Take 50,000 Units by mouth every Wednesday.

## 2017-08-01 NOTE — Progress Notes (Signed)
ANTICOAGULATION CONSULT NOTE - Follow-up Consult  Pharmacy Consult for Warfarin  Indication: Mechanical AVR  No Known Allergies   Vital Signs: Temp: 98.2 F (36.8 C) (04/02 0719) Temp Source: Oral (04/02 0719) BP: 163/88 (04/02 0719) Pulse Rate: 83 (04/02 0719)  Labs: Recent Labs    07/30/17 0448 07/31/17 0500 07/31/17 0754 08/01/17 0452  HGB 12.8*  --  11.9*  --   HCT 39.2  --  36.2*  --   PLT 307  --  294  --   LABPROT 39.7* 50.4*  --  44.3*  INR 4.13* 5.60*  --  4.76*  CREATININE 1.82*  --  1.44*  --     Estimated Creatinine Clearance: 35.1 mL/min (A) (by C-G formula based on SCr of 1.44 mg/dL (H)).   Medical History: Past Medical History:  Diagnosis Date  . Arthritis 02-15-12   osteoarthritis,right knee, pain right wrist.,  . Arthritis    "hands, legs" (12/01/2016)  . Carotid artery disease ([place])    Carotid US 1/18: R 40-59; L 80-99  . Chronic lower back pain   . Complication of anesthesia 02-15-12   very confused, and very slow to awaken after anesthesia  . Coronary artery disease 02-15-12   Heart valve replaced   . Depression   . Hypercholesterolemia 02-15-12  . Hypertension   . Memory loss   . PAD (peripheral artery disease) ([place])   . S/P AVR (aortic valve replacement)    Mechanical [employer] AVR // Echo 12/17: Moderate LVH, EF 60-65, normal wall motion, grade 1 diastolic dysfunction, mechanical AVR okay with mean gradient 8 mmHg, MAC, mild mitral stenosis (mean gradient 2 mmHg), mild MR, mild RAE, PASP 25  . Urinary frequency     Assessment: 84 y/o M on coumadin 7.28m daily PTA for mechanical aortic valve (1993). INR on admit is 3/86. Although last anticoag note on 2/26 states dose is 586mdaily exc 2.52m46mn Thurs/Sun with a therapeutic INR. Was recently on bridge with Lovenox after surgery. INR 3.8 on admit. INR 4.13>5.60>4.76  Goal of Therapy:  INR 2.5-3.5 Monitor platelets by anticoagulation protocol: Yes   Plan:  Hold Coumadin again  tonight Monitor daily INR, CBC, s/s of bleed  DwaTheotis BurrowharmD BCPS  Clinical Pharmacist Pager: [phone] 28992/2019,8:10 AM

## 2017-08-01 NOTE — Clinical Social Work Placement (Signed)
   CLINICAL SOCIAL WORK PLACEMENT  NOTE  Date:  08/01/2017  Patient Details  Name: Juan Li MRN: [id number] Date of Birth: October 18, 1931  Clinical Social Work is seeking post-discharge placement for this patient at the [place] level of care (*CSW will initial, date and re-position this form in  chart as items are completed):  Yes   Patient/family provided with [place] Work Department's list of facilities offering this level of care within the geographic area requested by the patient (or if unable, by the patient's family).  Yes   Patient/family informed of their freedom to choose among providers that offer the needed level of care, that participate in Medicare, Medicaid or managed care program needed by the patient, have an available bed and are willing to accept the patient.  Yes   Patient/family informed of [facility]'s ownership interest in [facility] and [facility], as well as of the fact that they are under no obligation to receive care at these facilities.  PASRR submitted to EDS on       PASRR number received on       Existing PASRR number confirmed on 08/01/17     FL2 transmitted to all facilities in geographic area requested by pt/family on       FL2 transmitted to all facilities within larger geographic area on 08/01/17     Patient informed that his/her managed care company has contracts with or will negotiate with certain facilities, including the following:        Yes   Patient/family informed of bed offers received.  Patient chooses bed at [place]     Physician recommends and patient chooses bed at      Patient to be transferred to [place] on 08/01/17.  Patient to be transferred to facility by [facility]     Patient family notified on 08/01/17 of transfer.  Name of family member notified:  spouse at bedside     PHYSICIAN       Additional Comment:     _______________________________________________ Normajean Baxter, LCSW 08/01/2017, 3:42 PM

## 2017-08-01 NOTE — Clinical Social Work Note (Signed)
Clinical Social Work Assessment  Patient Details  Name: SHAHZAIN KIESTER MRN: [id number] Date of Birth: [birth year]  Date of referral:  08/01/17               Reason for consult:  Facility Placement                Permission sought to share information with:  Chartered certified accountant granted to share information::  Yes, Verbal Permission Granted  Name::     [employer] Company::  SNF-[facility]  Relationship::     Contact Information:     Housing/Transportation Living arrangements for the past 2 months:    Source of Information:  Patient, Spouse Patient Interpreter Needed:  None Criminal Activity/Legal Involvement Pertinent to Current Situation/Hospitalization:  No - Comment as needed Significant Relationships:  Spouse, Other Family Members Lives with:  Spouse Do you feel safe going back to the place where you live?  No Need for family participation in patient care:  Yes (Comment)  Care giving concerns:  Pt will need short term rehab at discharge. Pt from [facility] rehab and will need to return for rehab.  Social Worker assessment / plan:  CSW spoke with spouse and she confirmed that patient is from home and was at [facility] for short term rehab before this hospitalization. Pt will need to return to SNF for rehab. CSW explained the SNF process/placement. Spouse voiced understanding. CSW then met with patient at bedside and advised that he would go to SNF as soon as he works with therapy, CSW will assist with disposition. CSW will send referral to SNF-[facility].  Employment status:  Retired Forensic scientist:  [employer] PT Recommendations:  [place] / Referral to community resources:  [place]  Patient/Family's Response to care:  Psychologist, prison and probation services of CSW assisting with SNF. Spouse thanked CSW for assisting with process to return to short term rehab at [facility].  Patient/Family's Understanding of and  Emotional Response to Diagnosis, Current Treatment, and Prognosis:  Patient/family have good understanding of diagnosis. Pt was independent prior to impairment. Pt will need short term rehab to return home to independence. Spouse hopeful that patient will get back to baseline. CSW validated spouse's concerns and feelings. CSW will assist with disposition. No issues or concerns identified.  Emotional Assessment Appearance:  Appears stated age Attitude/Demeanor/Rapport:  (Cooperative) Affect (typically observed):  Accepting, Appropriate Orientation:  Oriented to Self, Oriented to Place, Oriented to Situation Alcohol / Substance use:  Not Applicable Psych involvement (Current and /or in the community):  No (Comment)  Discharge Needs  Concerns to be addressed:  Discharge Planning Concerns Readmission within the last 30 days:  Yes Current discharge risk:  Dependent with Mobility, Physical Impairment Barriers to Discharge:  No Barriers Identified   Normajean Baxter, LCSW 08/01/2017, 11:59 AM

## 2017-08-01 NOTE — Social Work (Signed)
Clinical Social Worker facilitated patient discharge including contacting patient family and facility to confirm patient discharge plans.  Clinical information faxed to facility and family agreeable with plan.    CSW arranged ambulance transport via [facility] to [facility] .    RN to call [phone] to give eport prior to discharge.  Clinical Social Worker will sign off for now as social work intervention is no longer needed. Please consult [place] again if new need arises.  Elissa Hefty, LCSW Clinical Social Worker [phone]

## 2017-08-01 NOTE — Social Work (Signed)
CSW spoke with SNF and they confirmed that patient was in there short term rehab before this hospitalization. Pt would need PT/OT evaluation before returning to SNF for continued rehab.  Pt resides at home with spouse and will return home once all rehabilitation is complete.  CSW will f/uf or disposition.  Elissa Hefty, LCSW Clinical Social Worker [phone]

## 2017-08-01 NOTE — Progress Notes (Signed)
Family Medicine Teaching Service Daily Progress Note Intern Pager: [phone]  Patient name: Juan Li Medical record number: [id number] Date of birth: Feb 27, 1932 Age: 84 y.o. Gender: male  Primary Care Provider: Zenia Resides, MD Consultants: None Code Status: FULL  Pt Overview and Major Events to Date:  03/29 Admitted to FPTS  Assessment and Plan: Juan Li is a 84 y.o. male presenting with leukocytosis and hypotension. PMH is significant for Alzheimer's disease, HTN, HLD, chronic pain, and recent C5-C6discectomy on 3/11, CKD3, OA, hx of mechanical heart valve replacement (on coumadin and ASA), HFpEF, Vitamin D Deficiency, PVD and CAD.   Sepsis due to UTI: Clinically improving. Few hypertensive BP overnight. Lactic acidosis resolved, leukocytosis improving (15.1, 4/2) on PO Cefdinir. UA c/w UTI, urine culture positive for >100k Ecoli and >100k Enterobacter cloacae.  - Blood cx NGx>48hr - Urine cx - >100k Ecoli, >100k Enterobacter cloacae.  - d/c IVF   - cotninue Cefdinir on d/c. S/p CTX (3/29-3/31).  CDiff Colitis: Confirmatory testing performed at SNF. Started on PO vancomycin 250mg  QID at the nursing home on 3/25. 3 unmeasured stool in this morning. - PO vancomycin 125mg  q 6 hr, will continue on d/c   H/o Mechanical aortic valve replacement: On ASA and Coumadin. INR supra-therapeutic at 5.60>4.76, currently holding Coumadin.  - Hold coumadin, Coumadin per pharmacy - obtain daily INR  AKI on CKD3, resolved: Likely pre-renal in etiology due to his diarrhea. Baseline Cr 1.4-1.5. Cr 3.34 on admission. Cr improved further to 1.33 this AM (04/02).   - s/p IVF - Strict intake and output - Avoid nephrotoxic medications  S/p C5-C6discectomy on 3/11: Discectomy at C5-6 with decompression of cervical stenosis with myelopathy, by Dr. Kathyrn Sheriff on 07/11/17. Has C-spine collar.   - Tylenol PRN pain  - ok to remove collar for meals and showers per nursing home.  AMS I  Alzheimer's Dementia: Per wife, patient is at his baseline. Long term resident at [facility]. Wife reports of issues with short term memory. A&Ox3.   - Continue home dose of Aricept and Namenda.    Chronic HFpEF: Stable. Last ECHO 04/21/16 with EF of 60-65% with G1DD.  - monitor.   PVD I Carotid Artery Disease:   - ASA 81mg   - Pravastatin 40mg  daily   Hypertension: Currently holding home medication due to initial sepsis: Enalapril 20mg  BID, HCTZ 25 mg daily. Some hypertensive BP overnight, therefore can restart home meds. - Monitor  BPH:  - Continue home Proscar 5mg  daily and Flomax 0.4mg  daily  - Holding home Mirabegron   FEN/GI: soft diet Prophylaxis: elevated INR, on coumadin at home   Dipso: likely d/c back to [facility] today  Subjective:  Patient doing ok this morning, currently needing assistance for BM.   Objective: Temp:  [98.2 F (36.8 C)-98.5 F (36.9 C)] 98.2 F (36.8 C) (04/02 0719) Pulse Rate:  [69-83] 83 (04/02 0719) Resp:  [16-18] 16 (04/02 0719) BP: (117-163)/(69-88) 163/88 (04/02 0719) SpO2:  [95 %-99 %] 99 % (04/02 0719)   Physical Exam: General: elderly male lying in bed, in NAD; cervical collar in place Cardiovascular: RRR, mechanical valve click heard, no murmurs appreciated Respiratory: CTAB, normal WOB on RA Abdomen: soft, nondistended, nontender to palpation, +BS Extremities: moving all spontaneously Neuro: oriented to person, place, and time Psych: appropriate mood and affect  Laboratory: Recent Labs  Lab 07/29/17 0708 07/30/17 0448 07/31/17 0754  WBC 18.9* 18.1* 16.8*  HGB 12.5* 12.8* 11.9*  HCT 38.2* 39.2 36.2*  PLT 307 307 294   Recent Labs  Lab 07/27/17 1239  07/28/17 1618 07/29/17 0708 07/30/17 0448 07/31/17 0754  NA 141   < > 133* 136 140 140  K 5.4*  --  4.8 5.5* 5.3* 4.5  CL 103  --  102 109 113* 114*  CO2 9*  --  18* 18* 18* 17*  BUN CANCELED   < > 123* 109* 66* 41*  CREATININE 3.38*  --  3.34* 2.61* 1.82*  1.44*  CALCIUM 9.6  --  9.1 8.6* 9.2 9.0  PROT 7.0  --  7.3  --   --   --   BILITOT 0.4  --  0.9  --   --   --   ALKPHOS 70  --  59  --   --   --   ALT 11  --  13*  --   --   --   AST 18  --  18  --   --   --   GLUCOSE 102*   < > 100* 103* 99 95   < > = values in this interval not displayed.    Imaging/Diagnostic Tests: Dg Chest 2 View  Result Date: 07/28/2017 CLINICAL DATA:  Chest pain.  Hypotension.  Leukocytosis. EXAM: CHEST - 2 VIEW COMPARISON:  08/15/2012 FINDINGS: Sequelae of prior sternotomy are again identified. The cardiomediastinal silhouette is within normal limits. Aortic atherosclerosis is noted. No airspace consolidation, edema, pleural effusion, pneumothorax is identified. The posterior costophrenic angles were incompletely imaged. Prior cervical spine fusion is noted. IMPRESSION: No active cardiopulmonary disease. Electronically Signed   By: Logan Bores M.D.   On: 07/28/2017 17:01   Rory Percy, DO 08/01/2017, 7:29 AM PGY-1, [place] Intern pager: [phone], text pages welcome

## 2017-08-02 LAB — CULTURE, BLOOD (ROUTINE X 2)
Culture: NO GROWTH
Culture: NO GROWTH
SPECIAL REQUESTS: ADEQUATE

## 2017-08-06 DIAGNOSIS — M4712 Other spondylosis with myelopathy, cervical region: Secondary | ICD-10-CM | POA: Diagnosis not present

## 2017-08-06 DIAGNOSIS — I2510 Atherosclerotic heart disease of native coronary artery without angina pectoris: Secondary | ICD-10-CM | POA: Diagnosis not present

## 2017-08-06 DIAGNOSIS — A4189 Other specified sepsis: Secondary | ICD-10-CM | POA: Diagnosis not present

## 2017-08-06 DIAGNOSIS — F3289 Other specified depressive episodes: Secondary | ICD-10-CM | POA: Diagnosis not present

## 2017-08-06 DIAGNOSIS — I10 Essential (primary) hypertension: Secondary | ICD-10-CM | POA: Diagnosis not present

## 2017-08-06 DIAGNOSIS — G309 Alzheimer's disease, unspecified: Secondary | ICD-10-CM | POA: Diagnosis not present

## 2017-08-06 DIAGNOSIS — E440 Moderate protein-calorie malnutrition: Secondary | ICD-10-CM | POA: Diagnosis not present

## 2017-08-06 DIAGNOSIS — R2689 Other abnormalities of gait and mobility: Secondary | ICD-10-CM | POA: Diagnosis not present

## 2017-08-06 DIAGNOSIS — A0472 Enterocolitis due to Clostridium difficile, not specified as recurrent: Secondary | ICD-10-CM | POA: Diagnosis not present

## 2017-08-17 ENCOUNTER — Emergency Department (HOSPITAL_COMMUNITY): Payer: Medicare Other

## 2017-08-17 ENCOUNTER — Emergency Department (HOSPITAL_COMMUNITY)
Admission: EM | Admit: 2017-08-17 | Discharge: 2017-08-17 | Disposition: A | Discharge status: Home / Self Care | Payer: Medicare Other | Attending: Emergency Medicine | Admitting: Emergency Medicine

## 2017-08-17 ENCOUNTER — Other Ambulatory Visit: Payer: Self-pay

## 2017-08-17 ENCOUNTER — Encounter (HOSPITAL_COMMUNITY): Payer: Self-pay | Admitting: Emergency Medicine

## 2017-08-17 DIAGNOSIS — Z711 Person with feared health complaint in whom no diagnosis is made: Secondary | ICD-10-CM | POA: Insufficient documentation

## 2017-08-17 DIAGNOSIS — I10 Essential (primary) hypertension: Secondary | ICD-10-CM | POA: Diagnosis not present

## 2017-08-17 DIAGNOSIS — S199XXA Unspecified injury of neck, initial encounter: Secondary | ICD-10-CM | POA: Diagnosis not present

## 2017-08-17 DIAGNOSIS — Z96651 Presence of right artificial knee joint: Secondary | ICD-10-CM | POA: Insufficient documentation

## 2017-08-17 DIAGNOSIS — Z7901 Long term (current) use of anticoagulants: Secondary | ICD-10-CM | POA: Insufficient documentation

## 2017-08-17 DIAGNOSIS — Z79899 Other long term (current) drug therapy: Secondary | ICD-10-CM | POA: Insufficient documentation

## 2017-08-17 DIAGNOSIS — Y92129 Unspecified place in nursing home as the place of occurrence of the external cause: Secondary | ICD-10-CM

## 2017-08-17 DIAGNOSIS — I2510 Atherosclerotic heart disease of native coronary artery without angina pectoris: Secondary | ICD-10-CM | POA: Insufficient documentation

## 2017-08-17 DIAGNOSIS — F0390 Unspecified dementia without behavioral disturbance: Secondary | ICD-10-CM | POA: Insufficient documentation

## 2017-08-17 DIAGNOSIS — W06XXXA Fall from bed, initial encounter: Secondary | ICD-10-CM | POA: Insufficient documentation

## 2017-08-17 DIAGNOSIS — Y92122 Bedroom in nursing home as the place of occurrence of the external cause: Secondary | ICD-10-CM | POA: Insufficient documentation

## 2017-08-17 DIAGNOSIS — W19XXXA Unspecified fall, initial encounter: Secondary | ICD-10-CM

## 2017-08-17 DIAGNOSIS — Z87891 Personal history of nicotine dependence: Secondary | ICD-10-CM | POA: Insufficient documentation

## 2017-08-17 DIAGNOSIS — Y999 Unspecified external cause status: Secondary | ICD-10-CM | POA: Insufficient documentation

## 2017-08-17 DIAGNOSIS — Y939 Activity, unspecified: Secondary | ICD-10-CM | POA: Insufficient documentation

## 2017-08-17 HISTORY — DX: Unspecified dementia, unspecified severity, without behavioral disturbance, psychotic disturbance, mood disturbance, and anxiety: F03.90

## 2017-08-17 NOTE — ED Provider Notes (Addendum)
[facility] DEPT Provider Note: Georgena Spurling, MD, FACEP  CSN: [id number] MRN: [id number] ARRIVAL: 08/17/17 at [place]: RESA/RESA   CHIEF COMPLAINT  Fall  Level 5 caveat: Dementia HISTORY OF PRESENT ILLNESS  08/17/17 4:24 AM Juan Li is a 84 y.o. male with a history of dementia who had cervical fusion about a month ago.  He is in a cervical collar for postoperative immobilization.  He was getting out of bed and fell this morning at his nursing home and was sent in for evaluation of his cervical spine.  He did not strike his head and there was no obvious injury per EMS.  He denies pain or injury.   Past Medical History:  Diagnosis Date  . AKI (acute kidney injury) ([place]) 07/29/2017  . Arthritis 02-15-12   osteoarthritis,right knee, pain right wrist.,  . Arthritis    "hands, legs" (12/01/2016)  . Carotid artery disease ([place])    Carotid US 1/18: R 40-59; L 80-99  . Chronic lower back pain   . Complication of anesthesia 02-15-12   very confused, and very slow to awaken after anesthesia  . Coronary artery disease 02-15-12   Heart valve replaced   . Dementia   . Depression   . Hypercholesterolemia 02-15-12  . Hypertension   . Memory loss   . PAD (peripheral artery disease) ([place])   . S/P AVR (aortic valve replacement)    Mechanical [employer] AVR // Echo 12/17: Moderate LVH, EF 60-65, normal wall motion, grade 1 diastolic dysfunction, mechanical AVR okay with mean gradient 8 mmHg, MAC, mild mitral stenosis (mean gradient 2 mmHg), mild MR, mild RAE, PASP 25  . Urinary frequency     Past Surgical History:  Procedure Laterality Date  . ABDOMINAL AORTAGRAM  12/01/2016  . ABDOMINAL AORTOGRAM N/A 12/01/2016   Procedure: ABDOMINAL AORTOGRAM;  Surgeon: Nelva Bush, MD;  Location: [facility];  Service: Cardiovascular;  Laterality: N/A;  . ANTERIOR CERVICAL DECOMP/DISCECTOMY FUSION       Decompression at the level of C3-C4 and C4-C5, allograft and plate from C3 to  C%, microscope/notes 09/14/2010  . ANTERIOR CERVICAL DECOMP/DISCECTOMY FUSION N/A 07/11/2017   Procedure: CERVICAL FIVE-SIX ANTERIOR CERVICAL DECOMPRESSION/DISCECTOMY FUSION AND PLATE FIXATION;  Surgeon: Consuella Lose, MD;  Location: [facility];  Service: Neurosurgery;  Laterality: N/A;  CERVICAL FIVE-SIX ANTERIOR CERVICAL DECOMPRESSION/DISCECTOMY FUSION AND PLATE FIXATION  . BACK SURGERY    . CARDIAC VALVE REPLACEMENT    . CARPAL TUNNEL RELEASE Left 01/2003   Archie Endo 09/14/2010  . JOINT REPLACEMENT    . LOWER EXTREMITY ANGIOGRAPHY  12/01/2016  . LOWER EXTREMITY ANGIOGRAPHY Bilateral 12/01/2016   Procedure: Lower Extremity Angiography;  Surgeon: Nelva Bush, MD;  Location: [facility];  Service: Cardiovascular;  Laterality: Bilateral;  . MECHANICAL AORTIC VALVE REPLACEMENT  1993   Archie Endo 11/18/2016  . SHOULDER OPEN ROTATOR CUFF REPAIR Left 02/15/2012  . TOTAL KNEE ARTHROPLASTY  02/20/2012   Procedure: TOTAL KNEE ARTHROPLASTY;  Surgeon: Gearlean Alf, MD;  Location: [facility];  Service: Orthopedics;  Laterality: Right;  . TRANSURETHRAL RESECTION OF PROSTATE  02/01/2010   Archie Endo 02/06/2010    Family History  Problem Relation Age of Onset  . Heart disease Mother   . Hyperlipidemia Mother   . Hypertension Mother   . Varicose Veins Mother   . Hyperlipidemia Father   . Hypertension Father   . Heart attack Father     Social History   Tobacco Use  . Smoking status: Former  Smoker    Packs/day: 1.00    Years: 50.00    Pack years: 50.00    Types: Cigarettes    Last attempt to quit: 08/10/1995    Years since quitting: 22.0  . Smokeless tobacco: Never Used  Substance Use Topics  . Alcohol use: No    Alcohol/week: 0.0 oz    Frequency: Never    Comment: 12/01/2016 "nothing in the 2000s"  . Drug use: No    Prior to Admission medications   Medication Sig Start Date End Date Taking? Authorizing Provider  amLODipine (NORVASC) 10 MG tablet TAKE 1 TABLET BY MOUTH EVERY DAY Patient  taking differently: TAKE 1 TABLET (10 MG) BY MOUTH EVERY DAY 05/29/17  Yes Josue Hector, MD  buPROPion (WELLBUTRIN) 75 MG tablet Take 75 mg by mouth daily.   Yes [provider]  donepezil (ARICEPT) 5 MG tablet Take 1 tablet (5 mg total) by mouth daily. Patient taking differently: Take 5 mg by mouth at bedtime.  09/15/16  Yes [name], Jamal Collin, MD  finasteride (PROSCAR) 5 MG tablet TAKE 1 TABLET (5 MG TOTAL) BY MOUTH DAILY. Patient taking differently: take 4m by mouth daily. 05/29/17  Yes [name], WJamal Collin MD  memantine (NAMENDA) 5 MG tablet Take 1 tablet (5 mg total) by mouth 2 (two) times daily. 09/15/16  Yes [name], WJamal Collin MD  mirabegron ER (MYRBETRIQ) 50 MG TB24 tablet Take 1 tablet (50 mg total) by mouth daily. 06/12/17  Yes [name], WJamal Collin MD  NUTRITIONAL SUPPLEMENT LIQD Take 120 mLs by mouth 2 (two) times daily. Med pass   Yes [provider]  potassium chloride SA (K-DUR,KLOR-CON) 20 MEQ tablet Take 20 mEq by mouth every other day.   Yes [provider]  pravastatin (PRAVACHOL) 40 MG tablet Take 1 tablet (40 mg total) by mouth every evening. Patient taking differently: Take 40 mg by mouth at bedtime.  09/15/16  Yes [name], WJamal Collin MD  tamsulosin (FLOMAX) 0.4 MG CAPS capsule TAKE ONE CAPSULE BY MOUTH EVERY DAY Patient taking differently: TAKE ONE CAPSULE (0.4 MG) BY MOUTH EVERY DAY 03/07/17  Yes [name], WJamal Collin MD  traMADol (ULTRAM) 50 MG tablet Take 50 mg by mouth every 8 (eight) hours as needed for severe pain.   Yes [provider]  Vitamin D, Ergocalciferol, (DRISDOL) 50000 units CAPS capsule Take 50,000 Units by mouth every Wednesday.   Yes [provider]  warfarin (COUMADIN) 5 MG tablet Take 5 mg by mouth See admin instructions. Take 5 mg every day but on Thursday 7.5 mg   Yes [provider]  warfarin (COUMADIN) 7.5 MG tablet Take 7.5 mg by mouth every Thursday.   Yes [provider]    Allergies Patient has  no known allergies.   REVIEW OF SYSTEMS     PHYSICAL EXAMINATION  Initial Vital Signs Blood pressure 118/68, pulse 84, temperature 97.9 F (36.6 C), temperature source Oral, resp. rate 14, height 5' 6"  (1.676 m), weight 59 kg (130 lb), SpO2 99 %.  Examination General: Well-developed, well-nourished male in no acute distress; appearance consistent with age of record HENT: normocephalic; atraumatic; no scalp tenderness or hematoma palpated Eyes: pupils equal, round and reactive to light Neck: Immobilized in Aspen collar Heart: regular rate and rhythm Lungs: clear to auscultation bilaterally Abdomen: soft; nondistended; nontender; bowel sounds present Extremities: No acute deformity; no tenderness or pain on passive range of motion; pulses normal Neurologic: Sleeping but readily awakened; motor function intact in all extremities  and symmetric; no facial droop Skin: Warm and dry Psychiatric: Flat affect   RESULTS  Summary of this visit's results, reviewed by myself:   EKG Interpretation  Date/Time:    Ventricular Rate:    PR Interval:    QRS Duration:   QT Interval:    QTC Calculation:   R Axis:     Text Interpretation:        Laboratory Studies: No results found for this or any previous visit (from the past 24 hour(s)). Imaging Studies: Ct Cervical Spine Wo Contrast  Result Date: 08/17/2017 CLINICAL DATA:  Status post fall while getting out of bed; recent cervical fusion. Concern for cervical spine injury. EXAM: CT CERVICAL SPINE WITHOUT CONTRAST TECHNIQUE: Multidetector CT imaging of the cervical spine was performed without intravenous contrast. Multiplanar CT image reconstructions were also generated. COMPARISON:  MRI of the cervical spine performed 02/16/2017, and cervical spine fluoroscopic images performed 07/11/2017 FINDINGS: Alignment: There is mild grade 1 anterolisthesis of C5 on C6, reflecting underlying facet disease. Skull base and vertebrae: No acute fracture.  No primary bone lesion or focal pathologic process. Soft tissues and spinal canal: No prevertebral fluid or swelling. No visible canal hematoma. Disc levels: The patient is status post anterior cervical spinal fusion at C3-C5. Screws are also noted at C5-C6. Degenerative change is noted at the dens. Upper chest: Mild emphysema is noted at the lung apices. The thyroid gland is unremarkable. Calcification is noted at the carotid bifurcations bilaterally. Other: A small chronic infarct is noted at the right cerebellar hemisphere. IMPRESSION: 1. No evidence of fracture or subluxation along the cervical spine. 2. Mild degenerative change along the cervical spine. Status post cervical spinal fusion at C3-C6. 3. Mild emphysema at the lung apices. 4. Calcification at the carotid bifurcations bilaterally. Carotid ultrasound would be helpful for further evaluation, when and as deemed clinically appropriate. Electronically Signed   By: Garald Balding M.D.   On: 08/17/2017 05:31    ED COURSE and MDM  Nursing notes and initial vitals signs, including pulse oximetry, reviewed.  Vitals:   08/17/17 0400 08/17/17 0430 08/17/17 0500 08/17/17 0530  BP: 120/65 (!) 152/66 (!) 133/57 (!) 145/64  Pulse: 84 91 87 88  Resp:      Temp:      TempSrc:      SpO2: 97% 99% 96% 98%  Weight:      Height:       Patient has no evidence of significant injury on physical examination.  Because of recent cervical fusion a CT was obtained of his cervical spine which shows no acute fractures or disruptions of his fusion.  PROCEDURES    ED DIAGNOSES     ICD-10-CM   1. Fall at nursing home, initial encounter W19.Merril Abbe    S93.734        Shanon Rosser, MD 08/17/17 0630    Shanon Rosser, MD 08/17/17 [phone]

## 2017-08-17 NOTE — ED Notes (Signed)
[facility] contacted about transferring patient back to [facility]

## 2017-08-17 NOTE — ED Triage Notes (Signed)
Per EMS: Pt was getting out of bed and fell. Denies hitting head but had a cervical fusion almost a year ago. Staff at [facility] want him to be evaluated.

## 2017-08-24 ENCOUNTER — Other Ambulatory Visit: Payer: Self-pay | Admitting: Family Medicine

## 2017-08-24 DIAGNOSIS — I10 Essential (primary) hypertension: Secondary | ICD-10-CM

## 2017-08-24 MED ORDER — ENALAPRIL MALEATE 20 MG PO TABS: 20.0000 mg | ORAL_TABLET | Freq: Two times a day (BID) | ORAL | 3 refills | Status: DC

## 2017-08-24 NOTE — Telephone Encounter (Signed)
Called wife and verified dose.  He is taking 20 mg bid.  Rx sent in.

## 2017-08-25 ENCOUNTER — Telehealth: Payer: Self-pay

## 2017-08-25 NOTE — Telephone Encounter (Signed)
Pt's wife calling, requesting a Rx for an Probation officer be sent to [employer] [employer]. Fax number [phone] or can be emailed to [email or web address] Pt call back [phone] Wallace Cullens, RN

## 2017-08-28 NOTE — Telephone Encounter (Signed)
Pt's wife calling again about having Rx for stairlift sent to [employer]. Pt call back [phone] Wallace Cullens, RN

## 2017-08-28 NOTE — Telephone Encounter (Signed)
Rx faxed as requested.

## 2017-08-28 NOTE — Telephone Encounter (Signed)
Rx faxed to [facility]. Ottis Stain, CMA

## 2017-08-29 NOTE — Telephone Encounter (Signed)
Rx faxed to [phone]. Ottis Stain, CMA

## 2017-08-29 NOTE — Telephone Encounter (Signed)
Patient wife left message and is apologetic but had had to switch companies for chair lift. Please fax new prescription to chairlift to:  [address] Mobility of Juan Li [phone] attn: Vinnie Level  Patient's number is [phone].  Danley Danker, RN [facility] RN)

## 2017-08-30 ENCOUNTER — Ambulatory Visit (INDEPENDENT_AMBULATORY_CARE_PROVIDER_SITE_OTHER): Payer: Medicare Other | Admitting: Neurology

## 2017-08-30 ENCOUNTER — Encounter: Payer: Self-pay | Admitting: Neurology

## 2017-08-30 VITALS — BP 115/59 | HR 78

## 2017-08-30 DIAGNOSIS — I6523 Occlusion and stenosis of bilateral carotid arteries: Secondary | ICD-10-CM

## 2017-08-30 DIAGNOSIS — R269 Unspecified abnormalities of gait and mobility: Secondary | ICD-10-CM

## 2017-08-30 DIAGNOSIS — G3281 Cerebellar ataxia in diseases classified elsewhere: Secondary | ICD-10-CM

## 2017-08-30 DIAGNOSIS — Z9889 Other specified postprocedural states: Secondary | ICD-10-CM

## 2017-08-30 HISTORY — DX: Other specified postprocedural states: Z98.890

## 2017-08-30 NOTE — Progress Notes (Signed)
PATIENT: Juan Li DOB: [birth year]  Chief Complaint  Patient presents with  . Weakness/confusion    He is here with his wife, [name].  He was recently in hospital for a fall on 08/17/17.  He has not had his MRI scans yet.       HISTORICAL  Juan Li is a 84 year old male,  seen in refer by space his primary care doctor  Leanna Battles, for evaluation of weakness, initial evaluation was on July 27, 2017, he is brought in by his facility staff at [facility], patient is confused, provide limited history.  I was able to review multiple previous hospital admission note, he had a history of congestive heart failure, mechanical aortic valve 1993, on chronic Coumadin treatment, peripheral vascular disease, coronary artery disease, hypertension, hyperlipidemia, former smoker, quit in 1997, right total knee replacement in October 2013, he also has a history of dementia, taking Namenda, and Aricept,  He had disectomy at C5-6 with decompression of cervical stenosis with myelopathy, by Dr. Kathyrn Sheriff on July 11, 2017, was discharged to rehab.  Per patient, he lives at home still driving on Christmas of 2019, ambulate with a cane prior to hospital, he came in with wheelchair today, reported not feeling well, increased confusion, no longer ambulatory, but the patient is a poor historian, unable to provide limited information, he could not spell his wife's name, is not oriented to year, and months.  I reviewed MRI cervical in October 2018, prior to his recent decompression surgery, progressive ankylosis at C5-6, flattening of the cord, severe bilateral foraminal narrowing,  Laboratory evaluation showed elevated INR 2.33 in July 12, 2017,  Patient is on polypharmacy treatment, including Lovenox 80 mg subcutaneous twice a day, Coumadin 5 mg daily, aspirin 81 mg daily,  Our office was able to talk with the staff at the [facility], per conversation, his primary care physician Dr.  Sharlett Iles will have repeat INR soon, if INR is within therapeutic range, Lovenox will be stopped.  MRI of the brain 2016 shows significant atrophy, ventricular megaly, supratentorium small vessel disease, cerebellum chronic stroke  UPDATE Aug 30 2017: He is accompanied by his wife at today's clinical visit, patient was highly function prior to cervical decompression surgery on July 20, 2017, he was able to go up-and-down steps, oftentimes ambulate without any assistant, significant decline of his ambulatory ability since surgery, he is eager to be discharged from his current rehab facility, but could not get up without assistance, barely able to take any steps, he denies significant pain, no bowel bladder incontinence, on examinations, he was found to have bilateral upper extremity proximal weakness, mild bilateral proximal lower extremity weakness and bilateral ankle dorsiflexion weakness, significant retropulsion stability,  During last visit on July 27, 2017, he was noted to have significant confusion, later laboratory evaluation finds elevated INR of 3.8, he was on Lovenox, Coumadin, aspirin,  leukocytosis, he was admitted to the hospital the same day, he was in urosepsis, acute on chronic renal failure, he was treated with aggressive IV resuscitation, antibiotic, followed by p.o. vancomycin for positive C. Difficile,  Overall he has significant recovery, but his significant gait abnormalities still is not back to his presurgical level,  CT cervical on August 17 2017 showed status post cervical fusion at C3-C6, calcification of the carotid artery bifurcation bilaterally,   REVIEW OF SYSTEMS: Full 14 system review of systems performed and notable only for bruise easily, numbness, weakness, depression  ALLERGIES: No Known Allergies  HOME MEDICATIONS: Current Outpatient Medications  Medication Sig Dispense Refill  . amLODipine (NORVASC) 10 MG tablet TAKE 1 TABLET BY MOUTH EVERY DAY (Patient  taking differently: TAKE 1 TABLET (10 MG) BY MOUTH EVERY DAY) 30 tablet 10  . buPROPion (WELLBUTRIN) 75 MG tablet Take 75 mg by mouth daily.    . colchicine 0.6 MG tablet Take 0.6 mg by mouth once a week.    . donepezil (ARICEPT) 5 MG tablet Take 1 tablet (5 mg total) by mouth daily. (Patient taking differently: Take 5 mg by mouth at bedtime. ) 90 tablet 3  . finasteride (PROSCAR) 5 MG tablet TAKE 1 TABLET (5 MG TOTAL) BY MOUTH DAILY. (Patient taking differently: take 75m by mouth daily.) 90 tablet 3  . memantine (NAMENDA) 5 MG tablet Take 1 tablet (5 mg total) by mouth 2 (two) times daily. 180 tablet 3  . mirabegron ER (MYRBETRIQ) 50 MG TB24 tablet Take 1 tablet (50 mg total) by mouth daily. 30 tablet   . potassium chloride SA (K-DUR,KLOR-CON) 20 MEQ tablet Take 20 mEq by mouth every other day.    . pravastatin (PRAVACHOL) 40 MG tablet Take 1 tablet (40 mg total) by mouth every evening. (Patient taking differently: Take 40 mg by mouth at bedtime. ) 90 tablet 3  . Sennosides-Docusate Sodium (SENEXON-S PO) Take 1 tablet by mouth daily as needed (constipation).    . tamsulosin (FLOMAX) 0.4 MG CAPS capsule TAKE ONE CAPSULE BY MOUTH EVERY DAY (Patient taking differently: TAKE ONE CAPSULE (0.4 MG) BY MOUTH EVERY DAY) 90 capsule 3  . traMADol (ULTRAM) 50 MG tablet Take 50 mg by mouth every 8 (eight) hours as needed for severe pain.    . Vitamin D, Ergocalciferol, (DRISDOL) 50000 units CAPS capsule Take 50,000 Units by mouth every Wednesday.    . warfarin (COUMADIN) 5 MG tablet Take 5 mg by mouth See admin instructions. Take Tues, Wed, Fri, Sat, Sun.    . warfarin (COUMADIN) 7.5 MG tablet Take 7.5 mg by mouth. Take every Monday and Thursday.     No current facility-administered medications for this visit.     PAST MEDICAL HISTORY: Past Medical History:  Diagnosis Date  . [place] 07[place]  . Arthritis 02-15-12   osteoarthritis,right knee, pain right wrist.,  . Arthritis     "hands, legs" (12/01/2016)  . [place]id artery disease [place]    Carotid [place]: R 40-59; L 80-99  . Chronic lower back pain   . Complication of anesthesia 02-15-12   very confused, and very slow to awaken after anesthesia  . Coronary artery disease 02-15-12   Heart valve replaced   . Dementia   . Depression   . Hypercholesterolemia 02-15-12  . Hypertension   . Memory loss   . PAD (peripheral artery disease) ([facility]   . S/P AVR (aortic valve replacement)    Mechanical [employer] AVR // Echo 12/17: Moderate LVH, EF 60-65, normal wall motion, grade 1 diastolic dysfunction, mechanical AVR okay with mean gradient 8 mmHg, MAC, mild mitral stenosis (mean gradient 2 mmHg), mild MR, mild RAE, PASP 25  . Urinary frequency     PAST SURGICAL HISTORY: Past Surgical History:  Procedure Laterality Date  . ABDOMINAL AORTAGRAM  12/01/2016  . ABDOMINAL AORTOGRAM N/A 12/01/2016   Procedure: ABDOMINAL AORTOGRAM;  Surgeon: ENelva Bush MD;  [facility];  Service: Cardiovascular;  Laterality: N/A;  . ANTERIOR CERVICAL DECOMP/DISCECTOMY FUSION       Decompression at  the level of C3-C4 and C4-C5, allograft and plate from C3 to C%, microscope/notes 09/14/2010  . ANTERIOR CERVICAL DECOMP/DISCECTOMY FUSION N/A 07/11/2017   Procedure: CERVICAL FIVE-SIX ANTERIOR CERVICAL DECOMPRESSION/DISCECTOMY FUSION AND PLATE FIXATION;  Surgeon: Consuella Lose, MD;  Location: [facility];  Service: Neurosurgery;  Laterality: N/A;  CERVICAL FIVE-SIX ANTERIOR CERVICAL DECOMPRESSION/DISCECTOMY FUSION AND PLATE FIXATION  . BACK SURGERY    . CARDIAC VALVE REPLACEMENT    . CARPAL TUNNEL RELEASE Left 01/2003   Archie Endo 09/14/2010  . JOINT REPLACEMENT    . LOWER EXTREMITY ANGIOGRAPHY  12/01/2016  . LOWER EXTREMITY ANGIOGRAPHY Bilateral 12/01/2016   Procedure: Lower Extremity Angiography;  Surgeon: Nelva Bush, MD;  Location: [facility];  Service: Cardiovascular;  Laterality: Bilateral;  . MECHANICAL AORTIC VALVE  REPLACEMENT  1993   Archie Endo 11/18/2016  . SHOULDER OPEN ROTATOR CUFF REPAIR Left 02/15/2012  . TOTAL KNEE ARTHROPLASTY  02/20/2012   Procedure: TOTAL KNEE ARTHROPLASTY;  Surgeon: Gearlean Alf, MD;  [facility];  Service: Orthopedics;  Laterality: Right;  . TRANSURETHRAL RESECTION OF PROSTATE  02/01/2010   Archie Endo 02/06/2010    FAMILY HISTORY: Family History  Problem Relation Age of Onset  . Heart disease Mother   . Hyperlipidemia Mother   . Hypertension Mother   . Varicose Veins Mother   . Hyperlipidemia Father   . Hypertension Father   . Heart attack Father     SOCIAL HISTORY:  Social History   Socioeconomic History  . Marital status: Married    Spouse name: [name]  . Number of children: 3  . Years of education: Not on file  . Highest education level: Not on file  Occupational History  . 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 Financial resource strain: Not on file  . Food insecurity:    Worry: Not on file    Inability: Not on file  . Transportation needs:    Medical: Not on file    Non-medical: Not on file  Tobacco Use  . Smoking status: Former Smoker    Packs/day: 1.00    Years: 50.00    Pack years: 50.00    Types: Cigarettes    Last attempt to quit: 08/10/1995    Years since quitting: 22.0  . Smokeless tobacco: Never Used  Substance and Sexual Activity  . Alcohol use: No    Alcohol/week: 0.0 oz    Frequency: Never    Comment: 12/01/2016 "nothing in the 2000s"  . Drug use: No  . Sexual activity: Not on file  Lifestyle  . Physical activity:    Days per week: Not on file    Minutes per session: Not on file  . Stress: Not on file  Relationships  . Social connections:    Talks on phone: Not on file    Gets together: Not on file    Attends religious service: Not on file    Active member of club or organization: Not on file    Attends meetings of clubs or organizations: Not on file    Relationship status:  Not on file  . Intimate partner violence:    Fear of current or ex partner: Not on file    Emotionally abused: Not on file    Physically abused: Not on file    Forced sexual activity: Not on file  Other Topics Concern  . Not on file  Social History Narrative   Health Care POA: Wife, [name] Barron  Emergency Contact: wife, [place], [phone] (c)   End of Life Plan:   Who lives with you: Patient lives with wife.   Any pets:    Diet: Patient has a varied diet of protein, starch, and vegetables.   Exercise: Patient does not have a regular exercise plan, afraid of falling.   Seatbelts: Patient reports wearing seatbelt when in vehicle.    Sun Exposure/Protection: Patient does not wear sun protection.   Hobbies: Patient likes to do yard work.           PHYSICAL EXAM   Vitals:   08/30/17 1346  BP: (!) 115/59  Pulse: 78    Not recorded      There is no height or weight on file to calculate BMI.  PHYSICAL EXAMNIATION:  Gen: NAD, conversant, well nourised, obese, well groomed                     Cardiovascular: Regular rate rhythm, no peripheral edema, warm, nontender. Eyes: Conjunctivae clear without exudates or hemorrhage Neck: Supple, no carotid bruits. Pulmonary: Clear to auscultation bilaterally   NEUROLOGICAL EXAM:  MENTAL STATUS: Patient wears a rigid cervical collar, sitting in a wheelchair, seems quite uncomfortable, he  answers questions, but not oriented to year, month, place,   CRANIAL NERVES: CN II: Visual fields are full to confrontation.  Pupils were small, round, reactive to light CN III, IV, VI: extraocular movement are normal. No ptosis. CN V: Facial sensation is intact to pinprick in all 3 divisions bilaterally. Corneal responses are intact.  CN VII: Face is symmetric with normal eye closure and smile. CN VIII: Hearing is normal to rubbing fingers CN IX, X: Palate elevates symmetrically. Phonation is normal. CN XI: Head turning and  shoulder shrug are intact CN XII: Tongue is midline with normal movements and no atrophy.  MOTOR: Mild bilateral upper extremity shoulder abduction, hip flexion weakness, mild bilateral ankle dorsiflexion weakness no significant rigidity bradykinesia  REFLEXES: Reflexes are hypoactive and symmetric at the biceps, triceps, knees, and ankles. Plantar responses are flexor.  SENSORY: Not reliable, seems to have less dependent sensory changes to pinprick  COORDINATION: No finger to nose, heel to shin dysmetria  GAIT/STANCE: He needs assistance multiple times to get up from seated position, retropulse instability  DIAGNOSTIC DATA (LABS, IMAGING, TESTING) - I reviewed patient records, labs, notes, testing and imaging myself where available.   ASSESSMENT AND PLAN  Juan Li is a 84 y.o. male   Status post anterior cervical C5-6 decompression and fusion on July 11, 2017 Worsening gait abnormality Coumadin toxicity with INR of 3.8 in April 2019,  He was found to have bilateral upper and lower extremity proximal muscle weakness  His worsening gait abnormality could due to deconditioning, need to rule out cord compression, and muscular, neuromuscular junctional disorder   MRI of neuraxis structural lesion,  Laboratory evaluations including CPK, acetylcholine receptor antibody TSH  Continue physical therapy.  Marcial Pacas, M.D. Ph.D.  [facility] [address] [place], [place] [address] Ph: [phone] Fax: [phone]  CC: Leanna Battles, MD

## 2017-08-31 ENCOUNTER — Telehealth: Payer: Self-pay | Admitting: Neurology

## 2017-08-31 LAB — CK: Total CK: 51 U/L (ref 24–204)

## 2017-08-31 LAB — ACETYLCHOLINE RECEPTOR, BINDING: AChR Binding Ab, Serum: <0.03 nmol/L (ref 0.00–0.24)

## 2017-08-31 LAB — TSH: TSH: 5.71 u[IU]/mL — ABNORMAL HIGH (ref 0.450–4.500)

## 2017-08-31 NOTE — Telephone Encounter (Signed)
Pt wife states she received a call that Dr Krista Blue wants to see pt in a month but her 1st available is not until August.  Please call pt wife with date in June that you are able to get pt into

## 2017-08-31 NOTE — Telephone Encounter (Signed)
Returned call - he has been scheduled on 10/05/17 at 1pm.  They are aware to arrive early for check in.

## 2017-09-01 ENCOUNTER — Telehealth: Payer: Self-pay | Admitting: Neurology

## 2017-09-01 NOTE — Telephone Encounter (Signed)
Left patient and wife a detailed message, with results, on voicemail (ok per DPR).  Also, instructed them to contact PCP to schedule an appt to discuss his elevated TSH. Provided our number to call back with any questions.

## 2017-09-01 NOTE — Telephone Encounter (Signed)
Laboratory evaluation showed elevated TSH, indicating possible hypothyroidism, I have faxed the results to his primary care physician, he is to continue follow-up with his PCP, for potential hypothyroidism treatment, rest of the laboratory evaluations are normal.

## 2017-09-09 DIAGNOSIS — Z952 Presence of prosthetic heart valve: Secondary | ICD-10-CM | POA: Insufficient documentation

## 2017-09-10 ENCOUNTER — Ambulatory Visit
Admission: RE | Admit: 2017-09-10 | Discharge: 2017-09-10 | Disposition: A | Discharge status: Home / Self Care | Payer: Medicare Other | Source: Ambulatory Visit | Attending: Neurology | Admitting: Neurology

## 2017-09-10 DIAGNOSIS — G3281 Cerebellar ataxia in diseases classified elsewhere: Secondary | ICD-10-CM

## 2017-09-11 ENCOUNTER — Inpatient Hospital Stay: Admission: RE | Admit: 2017-09-11 | Payer: 59 | Source: Ambulatory Visit

## 2017-09-11 ENCOUNTER — Telehealth: Payer: Self-pay | Admitting: Neurology

## 2017-09-11 ENCOUNTER — Other Ambulatory Visit: Payer: 59

## 2017-09-11 NOTE — Telephone Encounter (Signed)
Please call patient, chronic findings on MRIs, will review films with him in next follow-up visit on October 05, 2017.  MRI of the brain shows severe generalized atrophy, ventricular enlargement, evidence of previous small vessel stroke  MRI of cervical spine showed solid fusion from C3-C5, there was evidence of myelomalacia (scar inside the spinal cord) from level 4-5,  MRI of the lumbar spine showed evidence of severe bilateral foraminal narrowing at L5-S1 level, potential bilateral L5 nerve root compression,  MRI of thoracic spine showed no significant abnormality,  Above imaging findings could potentially explain his continued gait abnormality.   IMPRESSION: This MRI of the brain without contrast shows the following: 1.    Severe generalized cortical atrophy and severe third and lateral ventricular enlargement.  The ventricular enlargement is likely due to the severe atrophy though an element of communicating hydrocephalus cannot be ruled out. 2.    Chronic ischemic foci in the right cerebellar hemisphere, bilateral basal ganglia consistent with small lacunar infarctions and in the white matter both hemispheres consistent with chronic microvascular ischemic change 3.    There are no acute findings.   When compared to the MRI dated 07/02/2014, there is no definite interval change.  IMPRESSION: This MRI of the cervical spine without contrast shows the following: 1.    There is remote solid fusion from C3-C5, unchanged when compared to the 02/16/2017 MRI.  Some increased signal is noted within the spinal cord adjacent to C4-C5 consistent with remote encephalomalacia.  There is residual mild spinal stenosis at C3-C4 and foraminal narrowing without nerve root compression at C3-C4 and C4-C5. 2.    Since the 2018 MRI, there is newer fusion at C5-C6 resolving the spinal stenosis noted at that time. 3.    At C6-C7, there is stable borderline spinal stenosis and moderate bilateral foraminal narrowing.   There is no definite nerve root compression.  IMPRESSION: This MRI of the thoracic spine without contrast shows the following: 1.   Multilevel disc degenerative changes as detailed above that do not lead to any nerve root compression. 2.   The spinal cord appears normal.  IMPRESSION: This MRI of the lumbar spine without contrast shows the following: 1.    Minimal degenerative changes at L2-L3 and L3-L4 that do not lead to any nerve root compression. 2.    At L4-L5, there is 2 mm of retrolisthesis, mild facet hypertrophy and right ligamentum flavum hypertrophy there is minimal foraminal and lateral recess stenosis but no nerve root compression. 3.    At L5-S1, there is 8 to 9 mm of anterolisthesis, broad disc protrusion, facet hypertrophy and ligamentum flavum hypertrophy.  There is severe bilateral foraminal narrowing that could lead to compression of either of the L5 nerve roots.

## 2017-09-12 NOTE — Telephone Encounter (Signed)
Spoke with [name] and reviewed below MRI results.  She verbalized understanding of same/fim

## 2017-09-20 ENCOUNTER — Ambulatory Visit (INDEPENDENT_AMBULATORY_CARE_PROVIDER_SITE_OTHER): Payer: Medicare Other | Admitting: *Deleted

## 2017-09-20 DIAGNOSIS — I359 Nonrheumatic aortic valve disorder, unspecified: Secondary | ICD-10-CM

## 2017-09-20 DIAGNOSIS — Z5181 Encounter for therapeutic drug level monitoring: Secondary | ICD-10-CM

## 2017-09-20 DIAGNOSIS — Z952 Presence of prosthetic heart valve: Secondary | ICD-10-CM

## 2017-09-20 DIAGNOSIS — Z7901 Long term (current) use of anticoagulants: Secondary | ICD-10-CM

## 2017-09-20 LAB — POCT INR: INR: 3.6 — AB (ref 2.0–3.0)

## 2017-09-20 NOTE — Patient Instructions (Signed)
Description   Today May 22nd take 1/2 tablet (2.5mg ) Pt's dose changed in SNF to coumadin 5mg  daily except  Coumadin 7.5mg  on Mondays Thursdays and Saturdays so this dose continued   Recheck in 1 week    Call  Coumadin Clinic [phone] with any questions or concerns

## 2017-09-22 DIAGNOSIS — I5032 Chronic diastolic (congestive) heart failure: Secondary | ICD-10-CM | POA: Diagnosis not present

## 2017-09-22 DIAGNOSIS — G309 Alzheimer's disease, unspecified: Secondary | ICD-10-CM | POA: Diagnosis not present

## 2017-09-22 DIAGNOSIS — I130 Hypertensive heart and chronic kidney disease with heart failure and stage 1 through stage 4 chronic kidney disease, or unspecified chronic kidney disease: Secondary | ICD-10-CM | POA: Diagnosis not present

## 2017-09-22 DIAGNOSIS — M4712 Other spondylosis with myelopathy, cervical region: Secondary | ICD-10-CM | POA: Diagnosis not present

## 2017-09-22 DIAGNOSIS — Z4789 Encounter for other orthopedic aftercare: Secondary | ICD-10-CM | POA: Diagnosis not present

## 2017-09-22 DIAGNOSIS — N183 Chronic kidney disease, stage 3 (moderate): Secondary | ICD-10-CM | POA: Diagnosis not present

## 2017-09-26 ENCOUNTER — Telehealth: Payer: Self-pay

## 2017-09-26 DIAGNOSIS — N183 Chronic kidney disease, stage 3 (moderate): Secondary | ICD-10-CM | POA: Diagnosis not present

## 2017-09-26 DIAGNOSIS — Z4789 Encounter for other orthopedic aftercare: Secondary | ICD-10-CM | POA: Diagnosis not present

## 2017-09-26 DIAGNOSIS — M4712 Other spondylosis with myelopathy, cervical region: Secondary | ICD-10-CM | POA: Diagnosis not present

## 2017-09-26 DIAGNOSIS — I5032 Chronic diastolic (congestive) heart failure: Secondary | ICD-10-CM | POA: Diagnosis not present

## 2017-09-26 DIAGNOSIS — I130 Hypertensive heart and chronic kidney disease with heart failure and stage 1 through stage 4 chronic kidney disease, or unspecified chronic kidney disease: Secondary | ICD-10-CM | POA: Diagnosis not present

## 2017-09-26 DIAGNOSIS — G309 Alzheimer's disease, unspecified: Secondary | ICD-10-CM | POA: Diagnosis not present

## 2017-09-26 NOTE — Telephone Encounter (Signed)
Patient wife left message stating patient has appt tomorrow but she needs a prescription/advice on what to give him for diarrhea. Gave him low dose of Pepto x 3 yesterday but still having diarrhea.  [facility] [address]  Call back is [phone]  Danley Danker, RN [facility])

## 2017-09-26 NOTE — Telephone Encounter (Signed)
Called.  Now out of rehab for c spine surg.  Loose bowels since DC from nursing home.  4-6 per day. Stable.  He does not feel sick or have abd pain.  No bleeding.  Black stools today but took pepto bismol yesterday.  Has appoinment with me tomorrow.   Keep appointment.  Recommend immodium AD.

## 2017-09-27 ENCOUNTER — Other Ambulatory Visit: Payer: Self-pay

## 2017-09-27 ENCOUNTER — Ambulatory Visit (INDEPENDENT_AMBULATORY_CARE_PROVIDER_SITE_OTHER): Payer: Medicare Other | Admitting: Family Medicine

## 2017-09-27 ENCOUNTER — Encounter: Payer: Self-pay | Admitting: Family Medicine

## 2017-09-27 DIAGNOSIS — I6523 Occlusion and stenosis of bilateral carotid arteries: Secondary | ICD-10-CM

## 2017-09-27 DIAGNOSIS — R531 Weakness: Secondary | ICD-10-CM | POA: Diagnosis not present

## 2017-09-27 DIAGNOSIS — M4712 Other spondylosis with myelopathy, cervical region: Secondary | ICD-10-CM | POA: Diagnosis not present

## 2017-09-27 DIAGNOSIS — R197 Diarrhea, unspecified: Secondary | ICD-10-CM | POA: Diagnosis not present

## 2017-09-27 NOTE — Assessment & Plan Note (Signed)
Has improved significantly with Imodium. - Advised continued use of Imodium as needed

## 2017-09-27 NOTE — Assessment & Plan Note (Signed)
Progress since surgery has been slow. Per patient's wife, seems to be doing better in past week since coming home from nursing facility. Recent sessions with in-home PT are encouraging for possibility of additional progress and functional gains. - Spent much of the visit counseling on expectation that full regain of function is likely unrealistic and that the risks of further surgeries outweigh their potential benefit - Discussed goal of enjoying the days that he has now to the fullest extent that he can

## 2017-09-27 NOTE — Progress Notes (Signed)
Date of Visit: 09/27/2017   HPI:  Mr. Juan Li is a 84 y.o man here today to discuss the following:  Gait instability - Needs assistance with ADLs, including toileting - Spends most of day sitting in chair or in bed; uses wheelchair to get around - No falls since coming home from nursing facility - Frustrated by inability to walk on his own/be on his feet - Has been working with in-home physical therapy to improve balance and strength; physical therapist noted that script will only cover 3 sessions this week and 2 sessions next week - Inquired about whether another surgery could help him regain balance and get back on his feet  Fatigue/Weakness - Sleeps 8-10 hours during the day and 8-10 hours each night - Says that he naps primarily because he has no reason to stay awake during the day; likes to read the sports section and watch TV - Feels like his strength has been about the same since coming home from nursing facility last Tuesday   Diarrhea - Ongoing episodes of loose stools while in nursing facility which continued over past week at home - Symptoms were causing patient to eat less - No recent antibiotic use - Started taking Imodium yesterday and has had significant improvement   ROS: See HPI.  PMH: cervical spondylosis w/ myelopathy, dementia, HFpEF, PVD, OA  PHYSICAL EXAM: BP (!) 110/58   Pulse (!) 104   Temp 98.1 F (36.7 C) (Oral)   Ht 5\' 6"  (1.676 m)   Wt 159 lb 3.2 oz (72.2 kg)   SpO2 99%   BMI 25.70 kg/m    Gen: Well appearing, sitting comfortably in wheelchair Neuro: grossly intact focal findings; alert and oriented   ASSESSMENT/PLAN:  Cervical spondylosis with myelopathy Progress since surgery has been slow. Per patient's wife, seems to be doing better in past week since coming home from nursing facility. Recent sessions with in-home PT are encouraging for possibility of additional progress and functional gains. - Spent much of the visit counseling on  expectation that full regain of function is likely unrealistic and that the risks of further surgeries outweigh their potential benefit - Discussed goal of enjoying the days that he has now to the fullest extent that he can  Diarrhea Has improved significantly with Imodium. - Advised continued use of Imodium as needed  FOLLOW UP: Follow up in 2 months to check in about progress with rehabilitation.  Michael Litter, [facility]

## 2017-09-27 NOTE — Patient Instructions (Signed)
See me in two months. Please enjoy the best days of your remaining life.

## 2017-09-28 ENCOUNTER — Ambulatory Visit (INDEPENDENT_AMBULATORY_CARE_PROVIDER_SITE_OTHER): Payer: Medicare Other | Admitting: *Deleted

## 2017-09-28 ENCOUNTER — Other Ambulatory Visit: Payer: Self-pay | Admitting: Family Medicine

## 2017-09-28 ENCOUNTER — Telehealth: Payer: Self-pay | Admitting: Medical

## 2017-09-28 ENCOUNTER — Encounter: Payer: Self-pay | Admitting: Family Medicine

## 2017-09-28 DIAGNOSIS — M4712 Other spondylosis with myelopathy, cervical region: Secondary | ICD-10-CM | POA: Diagnosis not present

## 2017-09-28 DIAGNOSIS — Z952 Presence of prosthetic heart valve: Secondary | ICD-10-CM | POA: Diagnosis not present

## 2017-09-28 DIAGNOSIS — Z7901 Long term (current) use of anticoagulants: Secondary | ICD-10-CM | POA: Diagnosis not present

## 2017-09-28 DIAGNOSIS — I5032 Chronic diastolic (congestive) heart failure: Secondary | ICD-10-CM | POA: Diagnosis not present

## 2017-09-28 DIAGNOSIS — N183 Chronic kidney disease, stage 3 (moderate): Secondary | ICD-10-CM | POA: Diagnosis not present

## 2017-09-28 DIAGNOSIS — Z5181 Encounter for therapeutic drug level monitoring: Secondary | ICD-10-CM | POA: Diagnosis not present

## 2017-09-28 DIAGNOSIS — I130 Hypertensive heart and chronic kidney disease with heart failure and stage 1 through stage 4 chronic kidney disease, or unspecified chronic kidney disease: Secondary | ICD-10-CM | POA: Diagnosis not present

## 2017-09-28 DIAGNOSIS — Z4789 Encounter for other orthopedic aftercare: Secondary | ICD-10-CM | POA: Diagnosis not present

## 2017-09-28 DIAGNOSIS — I359 Nonrheumatic aortic valve disorder, unspecified: Secondary | ICD-10-CM

## 2017-09-28 DIAGNOSIS — G309 Alzheimer's disease, unspecified: Secondary | ICD-10-CM | POA: Diagnosis not present

## 2017-09-28 LAB — PROTIME-INR
INR: 8.02 — AB
PROTHROMBIN TIME: 66.6 s — AB (ref 11.4–15.2)
Prothrombin Time: >120 s — ABNORMAL HIGH (ref 9.1–12.0)

## 2017-09-28 LAB — POCT INR: INR: 8 — AB (ref 2.0–3.0)

## 2017-09-28 NOTE — Telephone Encounter (Signed)
Received call from lab that INR 8.8 today. Was seen in the coumadin clinic today in follow up of INR check yesterday of 8.0. Her reported no bleeding and was instructed to hold his coumadin this evening. Will route to the coumadin clinic for further monitoring.

## 2017-09-28 NOTE — Assessment & Plan Note (Signed)
Multifactoreal.  Continue to work with PT.

## 2017-09-29 ENCOUNTER — Telehealth: Payer: Self-pay

## 2017-09-29 DIAGNOSIS — M4712 Other spondylosis with myelopathy, cervical region: Secondary | ICD-10-CM | POA: Diagnosis not present

## 2017-09-29 DIAGNOSIS — I130 Hypertensive heart and chronic kidney disease with heart failure and stage 1 through stage 4 chronic kidney disease, or unspecified chronic kidney disease: Secondary | ICD-10-CM | POA: Diagnosis not present

## 2017-09-29 DIAGNOSIS — Z4789 Encounter for other orthopedic aftercare: Secondary | ICD-10-CM | POA: Diagnosis not present

## 2017-09-29 DIAGNOSIS — I5032 Chronic diastolic (congestive) heart failure: Secondary | ICD-10-CM | POA: Diagnosis not present

## 2017-09-29 DIAGNOSIS — G309 Alzheimer's disease, unspecified: Secondary | ICD-10-CM | POA: Diagnosis not present

## 2017-09-29 DIAGNOSIS — N183 Chronic kidney disease, stage 3 (moderate): Secondary | ICD-10-CM | POA: Diagnosis not present

## 2017-09-29 MED ORDER — AMLODIPINE BESYLATE 5 MG PO TABS: 5.0000 mg | ORAL_TABLET | Freq: Every day | ORAL | 0 refills | Status: DC

## 2017-09-29 NOTE — Telephone Encounter (Signed)
Called wife back and left instructions on voicemail regarding ED if BP is very low. Asked her to call to make appt with PCP.  Amlodipine 5 mg sent to pharmacy because the 10 mg is too small for her to split and she only has 10 mg at home.  Danley Danker, RN [facility] RN)

## 2017-09-29 NOTE — Telephone Encounter (Signed)
Patient wife left message that patient BP running low yesterday, 98/60 and 100/51. Called her back and patient BP today taken by PT who is in the home now, is 127/87.  Patient wife states he is taking HCTZ, Enalapril, and Amlodipine 10 mg.  Clarified with wife that only BP med on list is Amlodipine 5 mg and others have been D/C'ed.  Requests new Rx for Amlodipine 5 mg as the 10 mg are too small to try to split.  [facility] [address]  Call back is [phone]  Danley Danker, RN [facility] RN)

## 2017-09-29 NOTE — Telephone Encounter (Signed)
Please schedule f/u with PCP ASAP for reassessment. ED visit if BP is low and remains symptomatic.

## 2017-09-29 NOTE — Telephone Encounter (Signed)
See coumadin encounter as of date 09/28/2017  09/29/2017

## 2017-09-29 NOTE — Patient Instructions (Signed)
Description   Has not taken coumadin today instructed no coumadin until results obtained from stat venipuncture  Monitor for any bleeding and go to ER if seen  Do good serving of greens today instructed will call with further instructions   Call  [facility] [phone] with any questions or concerns  Spoke with pt's wife and instructed that he ran greater than 10 at [employer] so sent to Hospital lab stat and instructed to monitor for any bleeding or  dark black stools and go to ER if occurs Try to eat good serving of greens as instructed and stressed no coumadin and that may have call from on call MD tonight and if not will call with results in Am she states understanding  09/29/2017 Spoke with pt's wife and instructed to continue to hold coumadin today May 31st no coumadin on June 1st and no coumadin on June 2nd Scheduled for INR check on Monday June 3rd continue to monitor for any bleeding and go to ER if seen do good serving of greens each day until returns for INR check on Monday

## 2017-10-02 ENCOUNTER — Ambulatory Visit (INDEPENDENT_AMBULATORY_CARE_PROVIDER_SITE_OTHER): Payer: Medicare Other | Admitting: *Deleted

## 2017-10-02 DIAGNOSIS — Z5181 Encounter for therapeutic drug level monitoring: Secondary | ICD-10-CM | POA: Diagnosis not present

## 2017-10-02 DIAGNOSIS — Z952 Presence of prosthetic heart valve: Secondary | ICD-10-CM | POA: Diagnosis not present

## 2017-10-02 DIAGNOSIS — I359 Nonrheumatic aortic valve disorder, unspecified: Secondary | ICD-10-CM

## 2017-10-02 DIAGNOSIS — Z7901 Long term (current) use of anticoagulants: Secondary | ICD-10-CM | POA: Diagnosis not present

## 2017-10-02 LAB — POCT INR: INR: 1.7 — AB (ref 2.0–3.0)

## 2017-10-02 NOTE — Patient Instructions (Signed)
Description   Today June 3rd take 1 and 1/2 tablets (7.5mg ) then resume dose of coumadin 1 tablet  (5mg ) daily except 1/2 tablet (2.5mg ) on Sundays and Thursdays Recheck INR in 10 days

## 2017-10-03 ENCOUNTER — Telehealth: Payer: Self-pay

## 2017-10-03 ENCOUNTER — Other Ambulatory Visit: Payer: Self-pay | Admitting: Family Medicine

## 2017-10-03 DIAGNOSIS — M4712 Other spondylosis with myelopathy, cervical region: Secondary | ICD-10-CM | POA: Diagnosis not present

## 2017-10-03 DIAGNOSIS — N183 Chronic kidney disease, stage 3 (moderate): Secondary | ICD-10-CM | POA: Diagnosis not present

## 2017-10-03 DIAGNOSIS — I739 Peripheral vascular disease, unspecified: Secondary | ICD-10-CM

## 2017-10-03 DIAGNOSIS — G309 Alzheimer's disease, unspecified: Secondary | ICD-10-CM | POA: Diagnosis not present

## 2017-10-03 DIAGNOSIS — I5032 Chronic diastolic (congestive) heart failure: Secondary | ICD-10-CM | POA: Diagnosis not present

## 2017-10-03 DIAGNOSIS — Z4789 Encounter for other orthopedic aftercare: Secondary | ICD-10-CM | POA: Diagnosis not present

## 2017-10-03 DIAGNOSIS — I130 Hypertensive heart and chronic kidney disease with heart failure and stage 1 through stage 4 chronic kidney disease, or unspecified chronic kidney disease: Secondary | ICD-10-CM | POA: Diagnosis not present

## 2017-10-03 NOTE — Telephone Encounter (Signed)
Called and discussed.  Will do bupropion qod x 3 doses then q3days x 3 doses then DC.  If behavior problems recur as we cut down dose, OK to remain on 75 per day.

## 2017-10-03 NOTE — Telephone Encounter (Signed)
Juan Li would like to talk to Dr. Andria Frames. Was told she needs to taper Juan Li off of Bupropion HCL 75 mg. Juan would like to know how to do this. She has been told it is very hard to do. Please call to discuss. Juan can be reached at [phone]. Ottis Stain, CMA

## 2017-10-05 ENCOUNTER — Telehealth: Payer: Self-pay

## 2017-10-05 ENCOUNTER — Ambulatory Visit (INDEPENDENT_AMBULATORY_CARE_PROVIDER_SITE_OTHER): Payer: Medicare Other | Admitting: Neurology

## 2017-10-05 ENCOUNTER — Encounter: Payer: Self-pay | Admitting: Neurology

## 2017-10-05 VITALS — BP 126/71 | HR 85

## 2017-10-05 DIAGNOSIS — I739 Peripheral vascular disease, unspecified: Secondary | ICD-10-CM | POA: Diagnosis not present

## 2017-10-05 DIAGNOSIS — I6523 Occlusion and stenosis of bilateral carotid arteries: Secondary | ICD-10-CM | POA: Diagnosis not present

## 2017-10-05 DIAGNOSIS — G959 Disease of spinal cord, unspecified: Secondary | ICD-10-CM | POA: Diagnosis not present

## 2017-10-05 DIAGNOSIS — R269 Unspecified abnormalities of gait and mobility: Secondary | ICD-10-CM | POA: Diagnosis not present

## 2017-10-05 DIAGNOSIS — N401 Enlarged prostate with lower urinary tract symptoms: Secondary | ICD-10-CM

## 2017-10-05 MED ORDER — OXYBUTYNIN CHLORIDE 5 MG PO TABS: 5.0000 mg | ORAL_TABLET | Freq: Every day | ORAL | 1 refills | Status: DC

## 2017-10-05 NOTE — Assessment & Plan Note (Signed)
Lots of nocturia.  Will Rx with low dose oxybutinin

## 2017-10-05 NOTE — Telephone Encounter (Signed)
Pt calling to ask Dr. Andria Frames for a Rx to stop frequent urination. Pt getting up 4-5 times a night. Please call pt and let him know if Dr. Andria Frames is sending a Rx. Ottis Stain, CMA

## 2017-10-05 NOTE — Telephone Encounter (Signed)
Called in Rx.  LM on wife's voicemail to watch for dizziness at night.

## 2017-10-05 NOTE — Addendum Note (Signed)
Addended by: Zenia Resides on: 10/05/2017 03:31 PM   Modules accepted: Orders

## 2017-10-05 NOTE — Progress Notes (Signed)
PATIENT: Juan Li DOB: [birth year]  Chief Complaint  Patient presents with  . Gait Abnormality    He is here with his wife, [name], to review his MRI results.  He is going to PT three times weekly.  Feels the therapy has been beneficial.     HISTORICAL  Juan Li is a 84 year old male,  seen in refer by space his primary care doctor  Leanna Battles, for evaluation of weakness, initial evaluation was on July 27, 2017, he is brought in by his facility staff at [facility], patient is confused, provide limited history.  I was able to review multiple previous hospital admission note, he had a history of congestive heart failure, mechanical aortic valve 1993, on chronic Coumadin treatment, peripheral vascular disease, coronary artery disease, hypertension, hyperlipidemia, former smoker, quit in 1997, right total knee replacement in October 2013, he also has a history of dementia, taking Namenda, and Aricept,  He had disectomy at C5-6 with decompression of cervical stenosis with myelopathy, by Dr. Kathyrn Sheriff on July 11, 2017, was discharged to rehab.  Per patient, he lives at home still driving on Christmas of 2019, ambulate with a cane prior to hospital, he came in with wheelchair today, reported not feeling well, increased confusion, no longer ambulatory, but the patient is a poor historian, unable to provide limited information, he could not spell his wife's name, is not oriented to year, and months.  I reviewed MRI cervical in October 2018, prior to his recent decompression surgery, progressive ankylosis at C5-6, flattening of the cord, severe bilateral foraminal narrowing,  Laboratory evaluation showed elevated INR 2.33 in July 12, 2017,  Patient is on polypharmacy treatment, including Lovenox 80 mg subcutaneous twice a day, Coumadin 5 mg daily, aspirin 81 mg daily,  Our office was able to talk with the staff at the [facility], per conversation, his primary care  physician Dr. Sharlett Iles will have repeat INR soon, if INR is within therapeutic range, Lovenox will be stopped.  MRI of the brain 2016 shows significant atrophy, ventricular megaly, supratentorium small vessel disease, cerebellum chronic stroke  UPDATE Aug 30 2017: He is accompanied by his wife at today's clinical visit, patient was highly function prior to cervical decompression surgery on July 20, 2017, he was able to go up-and-down steps, oftentimes ambulate without any assistant, significant decline of his ambulatory ability since surgery, he is eager to be discharged from his current rehab facility, but could not get up without assistance, barely able to take any steps, he denies significant pain, no bowel bladder incontinence, on examinations, he was found to have bilateral upper extremity proximal weakness, mild bilateral proximal lower extremity weakness and bilateral ankle dorsiflexion weakness, significant retropulsion stability,  During last visit on July 27, 2017, he was noted to have significant confusion, later laboratory evaluation finds elevated INR of 3.8, he was on Lovenox, Coumadin, aspirin,  leukocytosis, he was admitted to the hospital the same day, he was in urosepsis, acute on chronic renal failure, he was treated with aggressive IV resuscitation, antibiotic, followed by p.o. vancomycin for positive C. Difficile,  Overall he has significant recovery, but his significant gait abnormalities still is not back to his presurgical level,  CT cervical on August 17 2017 showed status post cervical fusion at C3-C6, calcification of the carotid artery bifurcation bilaterally,  UPDATE October 05 2017: He is accompanied by his wife at today's clinical visit we have reviewed extensive MRIs,  MRI of the brain shows severe  generalized atrophy, ventricular enlargement, evidence of previous small vessel stroke  MRI of cervical spine showed solid fusion from C3-C5, there was evidence of  myelomalacia from level 4-5,  MRI of the lumbar spine showed evidence of severe bilateral foraminal narrowing at L5-S1 level, potential bilateral L5 nerve root compression,  MRI of thoracic spine showed no significant abnormality,  He need assistance to get up from sitting to position difficulty initiate gait, likely due to combination of enlarged ventricle, generalized atrophy, cervical myelomalacia, with superimposed bilateral lumbosacral radiculopathy  He denies bowel and bladder incontinence,  Laboratory evaluation in May 2019 showed elevated TSH indicating hypothyroidism, normal or negative acetylcholine receptor binding antibody, CPK, REVIEW OF SYSTEMS: Full 14 system review of systems performed and notable only for bruise easily, numbness, weakness, depression  ALLERGIES: No Known Allergies  HOME MEDICATIONS: Current Outpatient Medications  Medication Sig Dispense Refill  . amLODipine (NORVASC) 5 MG tablet Take 1 tablet (5 mg total) by mouth daily. 90 tablet 0  . buPROPion (WELLBUTRIN) 75 MG tablet Take 75 mg by mouth daily.    . colchicine 0.6 MG tablet Take 0.6 mg by mouth once a week.    . donepezil (ARICEPT) 5 MG tablet Take 1 tablet (5 mg total) by mouth daily. (Patient taking differently: Take 5 mg by mouth at bedtime. ) 90 tablet 3  . finasteride (PROSCAR) 5 MG tablet TAKE 1 TABLET (5 MG TOTAL) BY MOUTH DAILY. (Patient taking differently: take 83m by mouth daily.) 90 tablet 3  . memantine (NAMENDA) 5 MG tablet TAKE 1 TABLET BY MOUTH TWICE A DAY 180 tablet 3  . potassium chloride SA (K-DUR,KLOR-CON) 20 MEQ tablet Take 20 mEq by mouth every other day.    . pravastatin (PRAVACHOL) 40 MG tablet TAKE 1 TABLET BY MOUTH EVERY DAY IN THE EVENING 90 tablet 3  . Sennosides-Docusate Sodium (SENEXON-S PO) Take 1 tablet by mouth daily as needed (constipation).    . tamsulosin (FLOMAX) 0.4 MG CAPS capsule TAKE ONE CAPSULE BY MOUTH EVERY DAY (Patient taking differently: TAKE ONE CAPSULE  (0.4 MG) BY MOUTH EVERY DAY) 90 capsule 3  . Vitamin D, Ergocalciferol, (DRISDOL) 50000 units CAPS capsule Take 50,000 Units by mouth every Wednesday.    . warfarin (COUMADIN) 5 MG tablet Take 5 mg by mouth See admin instructions. Take Tues, Wed, Fri, Sat, Sun.     No current facility-administered medications for this visit.     PAST MEDICAL HISTORY: Past Medical History:  Diagnosis Date  . AKI (acute kidney injury) ([facility] 07/29/2017  . Arthritis 02-15-12   osteoarthritis,right knee, pain right wrist.,  . Arthritis    "hands, legs" (12/01/2016)  . Carotid artery disease ([facility]    Carotid [place]: R 40-59; L 80-99  . Chronic lower back pain   . Complication of anesthesia 02-15-12   very confused, and very slow to awaken after anesthesia  . Coronary artery disease 02-15-12   Heart valve replaced   . Dementia   . Depression   . Hypercholesterolemia 02-15-12  . Hypertension   . Memory loss   . PAD (peripheral artery disease) ([facility]   . S/P AVR (aortic valve replacement)    Mechanical [employer] AVR // Echo 12/17: Moderate LVH, EF 60-65, normal wall motion, grade 1 diastolic dysfunction, mechanical AVR okay with mean gradient 8 mmHg, MAC, mild mitral stenosis (mean gradient 2 mmHg), mild MR, mild RAE, PASP 25  . Urinary frequency     PAST SURGICAL HISTORY: Past Surgical History:  Procedure Laterality Date  . ABDOMINAL AORTAGRAM  12/01/2016  . ABDOMINAL AORTOGRAM N/A 12/01/2016   Procedure: ABDOMINAL AORTOGRAM;  Surgeon: Nelva Bush, MD;  Location: [facility];  Service: Cardiovascular;  Laterality: N/A;  . ANTERIOR CERVICAL DECOMP/DISCECTOMY FUSION       Decompression at the level of C3-C4 and C4-C5, allograft and plate from C3 to C%, microscope/notes 09/14/2010  . ANTERIOR CERVICAL DECOMP/DISCECTOMY FUSION N/A 07/11/2017   Procedure: CERVICAL FIVE-SIX ANTERIOR CERVICAL DECOMPRESSION/DISCECTOMY FUSION AND PLATE FIXATION;  Surgeon: Consuella Lose, MD;  Location: [facility];   Service: Neurosurgery;  Laterality: N/A;  CERVICAL FIVE-SIX ANTERIOR CERVICAL DECOMPRESSION/DISCECTOMY FUSION AND PLATE FIXATION  . BACK SURGERY    . CARDIAC VALVE REPLACEMENT    . CARPAL TUNNEL RELEASE Left 01/2003   Archie Endo 09/14/2010  . JOINT REPLACEMENT    . LOWER EXTREMITY ANGIOGRAPHY  12/01/2016  . LOWER EXTREMITY ANGIOGRAPHY Bilateral 12/01/2016   Procedure: Lower Extremity Angiography;  Surgeon: Nelva Bush, MD;  Location: [facility];  Service: Cardiovascular;  Laterality: Bilateral;  . MECHANICAL AORTIC VALVE REPLACEMENT  1993   Archie Endo 11/18/2016  . SHOULDER OPEN ROTATOR CUFF REPAIR Left 02/15/2012  . TOTAL KNEE ARTHROPLASTY  02/20/2012   Procedure: TOTAL KNEE ARTHROPLASTY;  Surgeon: Gearlean Alf, MD;  Location: [facility];  Service: Orthopedics;  Laterality: Right;  . TRANSURETHRAL RESECTION OF PROSTATE  02/01/2010   Archie Endo 02/06/2010    FAMILY HISTORY: Family History  Problem Relation Age of Onset  . Heart disease Mother   . Hyperlipidemia Mother   . Hypertension Mother   . Varicose Veins Mother   . Hyperlipidemia Father   . Hypertension Father   . Heart attack Father     SOCIAL HISTORY:  Social History   Socioeconomic History  . Marital status: Married    Spouse name: [name]  . Number of children: 3  . Years of education: Not on file  . Highest education level: Not on file  Occupational History  . 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 Financial resource strain: Not on file  . Food insecurity:    Worry: Not on file    Inability: Not on file  . Transportation needs:    Medical: Not on file    Non-medical: Not on file  Tobacco Use  . Smoking status: Former Smoker    Packs/day: 1.00    Years: 50.00    Pack years: 50.00    Types: Cigarettes    Last attempt to quit: 08/10/1995    Years since quitting: 22.1  . Smokeless tobacco: Never Used  Substance and Sexual Activity  . Alcohol use: No     Alcohol/week: 0.0 oz    Frequency: Never    Comment: 12/01/2016 "nothing in the 2000s"  . Drug use: No  . Sexual activity: Not on file  Lifestyle  . Physical activity:    Days per week: Not on file    Minutes per session: Not on file  . Stress: Not on file  Relationships  . Social connections:    Talks on phone: Not on file    Gets together: Not on file    Attends religious service: Not on file    Active member of club or organization: Not on file    Attends meetings of clubs or organizations: Not on file    Relationship status: Not on file  . Intimate partner violence:    Fear of current or  ex partner: Not on file    Emotionally abused: Not on file    Physically abused: Not on file    Forced sexual activity: Not on file  Other Topics Concern  . Not on file  Social History Narrative   Health Care POA: Wife, [place]   Emergency Contact: wife, [place], [phone] (c)   End of Life Plan:   Who lives with you: Patient lives with wife.   Any pets:    Diet: Patient has a varied diet of protein, starch, and vegetables.   Exercise: Patient does not have a regular exercise plan, afraid of falling.   Seatbelts: Patient reports wearing seatbelt when in vehicle.    Sun Exposure/Protection: Patient does not wear sun protection.   Hobbies: Patient likes to do yard work.           PHYSICAL EXAM   Vitals:   10/05/17 1322  BP: 126/71  Pulse: 85    Not recorded      There is no height or weight on file to calculate BMI.  PHYSICAL EXAMNIATION:  Gen: NAD, conversant, well nourised, obese, well groomed                     Cardiovascular: Regular rate rhythm, no peripheral edema, warm, nontender. Eyes: Conjunctivae clear without exudates or hemorrhage Neck: Supple, no carotid bruits. Pulmonary: Clear to auscultation bilaterally   NEUROLOGICAL EXAM:  MENTAL STATUS: Patient wears a rigid cervical collar, sitting in a wheelchair, seems quite uncomfortable,  he  answers questions, but not oriented to year, month, place,   CRANIAL NERVES: CN II: Visual fields are full to confrontation.  Pupils were small, round, reactive to light CN III, IV, VI: extraocular movement are normal. No ptosis. CN V: Facial sensation is intact to pinprick in all 3 divisions bilaterally. Corneal responses are intact.  CN VII: Face is symmetric with normal eye closure and smile. CN VIII: Hearing is normal to rubbing fingers CN IX, X: Palate elevates symmetrically. Phonation is normal. CN XI: Head turning and shoulder shrug are intact CN XII: Tongue is midline with normal movements and no atrophy.  MOTOR: Mild bilateral upper extremity shoulder abduction, fixation of left upper extremity on rapid rotating movement, hip flexion weakness, mild bilateral ankle dorsiflexion weakness, left worse than right   REFLEXES: Reflexes are hypoactive and symmetric at the biceps, triceps, knees, and ankles. Plantar responses are flexor.  SENSORY: Not reliable, seems to have less dependent sensory changes to pinprick  COORDINATION: No finger to nose, heel to shin dysmetria  GAIT/STANCE: He needs assistance multiple times to get up from seated position, retropulse instability  DIAGNOSTIC DATA (LABS, IMAGING, TESTING) - I reviewed patient records, labs, notes, testing and imaging myself where available.   ASSESSMENT AND PLAN  Juan Li is a 84 y.o. male   Status post anterior cervical C5-6 decompression and fusion on July 11, 2017 Worsening gait abnormality  His worsening gait abnormality multifactorial, this includes significant brain atrophy, ventriculomegaly, cervical myelomalacia, likely a component of lumbar radiculopathy, deconditioning,  Is to continue physical therapy  Marcial Pacas, M.D. Ph.D.  [facility] [address] [place], [place] [address] Ph: [phone] Fax: [phone]  CC: Leanna Battles, MD

## 2017-10-05 NOTE — Telephone Encounter (Signed)
Pt wife returned call and was informed of below message. Pura Picinich, Salome Spotted, CMA

## 2017-10-06 DIAGNOSIS — M4712 Other spondylosis with myelopathy, cervical region: Secondary | ICD-10-CM | POA: Diagnosis not present

## 2017-10-06 DIAGNOSIS — Z4789 Encounter for other orthopedic aftercare: Secondary | ICD-10-CM | POA: Diagnosis not present

## 2017-10-06 DIAGNOSIS — N183 Chronic kidney disease, stage 3 (moderate): Secondary | ICD-10-CM | POA: Diagnosis not present

## 2017-10-06 DIAGNOSIS — I5032 Chronic diastolic (congestive) heart failure: Secondary | ICD-10-CM | POA: Diagnosis not present

## 2017-10-06 DIAGNOSIS — I130 Hypertensive heart and chronic kidney disease with heart failure and stage 1 through stage 4 chronic kidney disease, or unspecified chronic kidney disease: Secondary | ICD-10-CM | POA: Diagnosis not present

## 2017-10-06 DIAGNOSIS — G309 Alzheimer's disease, unspecified: Secondary | ICD-10-CM | POA: Diagnosis not present

## 2017-10-11 DIAGNOSIS — I5032 Chronic diastolic (congestive) heart failure: Secondary | ICD-10-CM | POA: Diagnosis not present

## 2017-10-11 DIAGNOSIS — N183 Chronic kidney disease, stage 3 (moderate): Secondary | ICD-10-CM | POA: Diagnosis not present

## 2017-10-11 DIAGNOSIS — M4712 Other spondylosis with myelopathy, cervical region: Secondary | ICD-10-CM | POA: Diagnosis not present

## 2017-10-11 DIAGNOSIS — Z4789 Encounter for other orthopedic aftercare: Secondary | ICD-10-CM | POA: Diagnosis not present

## 2017-10-11 DIAGNOSIS — G309 Alzheimer's disease, unspecified: Secondary | ICD-10-CM | POA: Diagnosis not present

## 2017-10-11 DIAGNOSIS — I130 Hypertensive heart and chronic kidney disease with heart failure and stage 1 through stage 4 chronic kidney disease, or unspecified chronic kidney disease: Secondary | ICD-10-CM | POA: Diagnosis not present

## 2017-10-13 ENCOUNTER — Other Ambulatory Visit: Payer: Self-pay | Admitting: Family Medicine

## 2017-10-16 DIAGNOSIS — I130 Hypertensive heart and chronic kidney disease with heart failure and stage 1 through stage 4 chronic kidney disease, or unspecified chronic kidney disease: Secondary | ICD-10-CM | POA: Diagnosis not present

## 2017-10-16 DIAGNOSIS — M4712 Other spondylosis with myelopathy, cervical region: Secondary | ICD-10-CM | POA: Diagnosis not present

## 2017-10-16 DIAGNOSIS — Z4789 Encounter for other orthopedic aftercare: Secondary | ICD-10-CM | POA: Diagnosis not present

## 2017-10-16 DIAGNOSIS — N183 Chronic kidney disease, stage 3 (moderate): Secondary | ICD-10-CM | POA: Diagnosis not present

## 2017-10-16 DIAGNOSIS — I5032 Chronic diastolic (congestive) heart failure: Secondary | ICD-10-CM | POA: Diagnosis not present

## 2017-10-16 DIAGNOSIS — G309 Alzheimer's disease, unspecified: Secondary | ICD-10-CM | POA: Diagnosis not present

## 2017-10-17 ENCOUNTER — Ambulatory Visit (INDEPENDENT_AMBULATORY_CARE_PROVIDER_SITE_OTHER): Payer: Medicare Other

## 2017-10-17 DIAGNOSIS — Z952 Presence of prosthetic heart valve: Secondary | ICD-10-CM | POA: Diagnosis not present

## 2017-10-17 DIAGNOSIS — Z7901 Long term (current) use of anticoagulants: Secondary | ICD-10-CM | POA: Diagnosis not present

## 2017-10-17 DIAGNOSIS — Z5181 Encounter for therapeutic drug level monitoring: Secondary | ICD-10-CM | POA: Diagnosis not present

## 2017-10-17 DIAGNOSIS — I359 Nonrheumatic aortic valve disorder, unspecified: Secondary | ICD-10-CM | POA: Diagnosis not present

## 2017-10-17 LAB — POCT INR: INR: 2.2 (ref 2.0–3.0)

## 2017-10-17 NOTE — Patient Instructions (Signed)
Description   Take 1.5 tablets (7.5mg ) today, then start taking 1 tablet (5mg ) daily except 1/2 tablet (2.5mg ) on Sundays. Recheck INR in 10 days

## 2017-10-18 DIAGNOSIS — Z4789 Encounter for other orthopedic aftercare: Secondary | ICD-10-CM | POA: Diagnosis not present

## 2017-10-18 DIAGNOSIS — M4712 Other spondylosis with myelopathy, cervical region: Secondary | ICD-10-CM | POA: Diagnosis not present

## 2017-10-18 DIAGNOSIS — I130 Hypertensive heart and chronic kidney disease with heart failure and stage 1 through stage 4 chronic kidney disease, or unspecified chronic kidney disease: Secondary | ICD-10-CM | POA: Diagnosis not present

## 2017-10-18 DIAGNOSIS — I5032 Chronic diastolic (congestive) heart failure: Secondary | ICD-10-CM | POA: Diagnosis not present

## 2017-10-18 DIAGNOSIS — G309 Alzheimer's disease, unspecified: Secondary | ICD-10-CM | POA: Diagnosis not present

## 2017-10-18 DIAGNOSIS — N183 Chronic kidney disease, stage 3 (moderate): Secondary | ICD-10-CM | POA: Diagnosis not present

## 2017-10-25 ENCOUNTER — Telehealth: Payer: Self-pay | Admitting: Family Medicine

## 2017-10-25 MED ORDER — BUPROPION HCL 75 MG PO TABS: 75.0000 mg | ORAL_TABLET | Freq: Every day | ORAL | 3 refills | Status: DC

## 2017-10-25 NOTE — Telephone Encounter (Signed)
Called and LM that I sent in Rx

## 2017-10-25 NOTE — Telephone Encounter (Signed)
Patient needs a prescription (written from you) for the Bupropion hcl 75 mg tablet.  He had it written by another doctor previously.  Best number to call w/questions is # [phone]  Pharmacy is [facility] on [address].

## 2017-10-26 DIAGNOSIS — M4712 Other spondylosis with myelopathy, cervical region: Secondary | ICD-10-CM | POA: Diagnosis not present

## 2017-10-26 DIAGNOSIS — I5032 Chronic diastolic (congestive) heart failure: Secondary | ICD-10-CM | POA: Diagnosis not present

## 2017-10-26 DIAGNOSIS — N183 Chronic kidney disease, stage 3 (moderate): Secondary | ICD-10-CM | POA: Diagnosis not present

## 2017-10-26 DIAGNOSIS — G309 Alzheimer's disease, unspecified: Secondary | ICD-10-CM | POA: Diagnosis not present

## 2017-10-26 DIAGNOSIS — Z4789 Encounter for other orthopedic aftercare: Secondary | ICD-10-CM | POA: Diagnosis not present

## 2017-10-26 DIAGNOSIS — I130 Hypertensive heart and chronic kidney disease with heart failure and stage 1 through stage 4 chronic kidney disease, or unspecified chronic kidney disease: Secondary | ICD-10-CM | POA: Diagnosis not present

## 2017-10-27 ENCOUNTER — Other Ambulatory Visit: Payer: Self-pay | Admitting: Family Medicine

## 2017-10-27 ENCOUNTER — Ambulatory Visit (INDEPENDENT_AMBULATORY_CARE_PROVIDER_SITE_OTHER): Payer: Medicare Other | Admitting: *Deleted

## 2017-10-27 DIAGNOSIS — Z952 Presence of prosthetic heart valve: Secondary | ICD-10-CM | POA: Diagnosis not present

## 2017-10-27 DIAGNOSIS — I359 Nonrheumatic aortic valve disorder, unspecified: Secondary | ICD-10-CM | POA: Diagnosis not present

## 2017-10-27 DIAGNOSIS — Z5181 Encounter for therapeutic drug level monitoring: Secondary | ICD-10-CM | POA: Diagnosis not present

## 2017-10-27 DIAGNOSIS — Z7901 Long term (current) use of anticoagulants: Secondary | ICD-10-CM

## 2017-10-27 DIAGNOSIS — N401 Enlarged prostate with lower urinary tract symptoms: Secondary | ICD-10-CM

## 2017-10-27 LAB — POCT INR: INR: 2.2 (ref 2.0–3.0)

## 2017-10-27 NOTE — Patient Instructions (Signed)
Description   Take 1.5 tablets (7.5mg ) today, then start taking 1 tablet (5mg ) daily. Recheck INR in 10 days.

## 2017-10-30 DIAGNOSIS — M4712 Other spondylosis with myelopathy, cervical region: Secondary | ICD-10-CM | POA: Diagnosis not present

## 2017-10-30 DIAGNOSIS — G309 Alzheimer's disease, unspecified: Secondary | ICD-10-CM | POA: Diagnosis not present

## 2017-10-30 DIAGNOSIS — Z4789 Encounter for other orthopedic aftercare: Secondary | ICD-10-CM | POA: Diagnosis not present

## 2017-10-30 DIAGNOSIS — I130 Hypertensive heart and chronic kidney disease with heart failure and stage 1 through stage 4 chronic kidney disease, or unspecified chronic kidney disease: Secondary | ICD-10-CM | POA: Diagnosis not present

## 2017-10-30 DIAGNOSIS — I5032 Chronic diastolic (congestive) heart failure: Secondary | ICD-10-CM | POA: Diagnosis not present

## 2017-10-30 DIAGNOSIS — N183 Chronic kidney disease, stage 3 (moderate): Secondary | ICD-10-CM | POA: Diagnosis not present

## 2017-11-08 ENCOUNTER — Ambulatory Visit (INDEPENDENT_AMBULATORY_CARE_PROVIDER_SITE_OTHER): Payer: Medicare Other | Admitting: *Deleted

## 2017-11-08 ENCOUNTER — Encounter (INDEPENDENT_AMBULATORY_CARE_PROVIDER_SITE_OTHER): Payer: Self-pay

## 2017-11-08 DIAGNOSIS — Z952 Presence of prosthetic heart valve: Secondary | ICD-10-CM | POA: Diagnosis not present

## 2017-11-08 DIAGNOSIS — Z5181 Encounter for therapeutic drug level monitoring: Secondary | ICD-10-CM

## 2017-11-08 DIAGNOSIS — Z7901 Long term (current) use of anticoagulants: Secondary | ICD-10-CM | POA: Diagnosis not present

## 2017-11-08 DIAGNOSIS — I359 Nonrheumatic aortic valve disorder, unspecified: Secondary | ICD-10-CM

## 2017-11-08 LAB — POCT INR: INR: 2.1 (ref 2.0–3.0)

## 2017-11-08 NOTE — Patient Instructions (Signed)
Description   Take 1.5 tablets (7.5mg ) today July 10th then take coumadin  dose as instructed on last visit 1 tablet (5mg ) daily. Recheck INR in 2 weeks

## 2017-11-22 ENCOUNTER — Ambulatory Visit (INDEPENDENT_AMBULATORY_CARE_PROVIDER_SITE_OTHER): Payer: Medicare Other | Admitting: *Deleted

## 2017-11-22 DIAGNOSIS — Z7901 Long term (current) use of anticoagulants: Secondary | ICD-10-CM | POA: Diagnosis not present

## 2017-11-22 DIAGNOSIS — Z952 Presence of prosthetic heart valve: Secondary | ICD-10-CM

## 2017-11-22 DIAGNOSIS — I359 Nonrheumatic aortic valve disorder, unspecified: Secondary | ICD-10-CM | POA: Diagnosis not present

## 2017-11-22 DIAGNOSIS — Z5181 Encounter for therapeutic drug level monitoring: Secondary | ICD-10-CM

## 2017-11-22 LAB — POCT INR: INR: 3 (ref 2.0–3.0)

## 2017-11-22 NOTE — Patient Instructions (Signed)
Description   Continue taking 1 tablet (5mg ) everyday.  Recheck INR in 2 weeks. Call us with any medication changes or concerns # [phone] [facility].

## 2017-12-06 ENCOUNTER — Ambulatory Visit (INDEPENDENT_AMBULATORY_CARE_PROVIDER_SITE_OTHER): Payer: Medicare Other | Admitting: *Deleted

## 2017-12-06 DIAGNOSIS — I359 Nonrheumatic aortic valve disorder, unspecified: Secondary | ICD-10-CM

## 2017-12-06 DIAGNOSIS — Z7901 Long term (current) use of anticoagulants: Secondary | ICD-10-CM | POA: Diagnosis not present

## 2017-12-06 DIAGNOSIS — Z5181 Encounter for therapeutic drug level monitoring: Secondary | ICD-10-CM | POA: Diagnosis not present

## 2017-12-06 DIAGNOSIS — Z952 Presence of prosthetic heart valve: Secondary | ICD-10-CM

## 2017-12-06 DIAGNOSIS — I10 Essential (primary) hypertension: Secondary | ICD-10-CM | POA: Diagnosis not present

## 2017-12-06 DIAGNOSIS — M4712 Other spondylosis with myelopathy, cervical region: Secondary | ICD-10-CM | POA: Diagnosis not present

## 2017-12-06 LAB — POCT INR: INR: 2.2 (ref 2.0–3.0)

## 2017-12-06 NOTE — Patient Instructions (Signed)
Description   Today take 1.5 tablets, Continue taking 1 tablet (5mg ) everyday.  Recheck INR in 2 weeks. Call us with any medication changes or concerns # [phone] [facility].

## 2017-12-12 DIAGNOSIS — R351 Nocturia: Secondary | ICD-10-CM | POA: Diagnosis not present

## 2017-12-12 DIAGNOSIS — N401 Enlarged prostate with lower urinary tract symptoms: Secondary | ICD-10-CM | POA: Diagnosis not present

## 2017-12-19 ENCOUNTER — Other Ambulatory Visit: Payer: Self-pay | Admitting: Family Medicine

## 2017-12-20 ENCOUNTER — Ambulatory Visit (INDEPENDENT_AMBULATORY_CARE_PROVIDER_SITE_OTHER): Payer: Medicare Other

## 2017-12-20 DIAGNOSIS — I359 Nonrheumatic aortic valve disorder, unspecified: Secondary | ICD-10-CM

## 2017-12-20 DIAGNOSIS — Z5181 Encounter for therapeutic drug level monitoring: Secondary | ICD-10-CM | POA: Diagnosis not present

## 2017-12-20 DIAGNOSIS — Z952 Presence of prosthetic heart valve: Secondary | ICD-10-CM | POA: Diagnosis not present

## 2017-12-20 DIAGNOSIS — Z7901 Long term (current) use of anticoagulants: Secondary | ICD-10-CM

## 2017-12-20 LAB — POCT INR: INR: 2.1 (ref 2.0–3.0)

## 2017-12-20 NOTE — Patient Instructions (Signed)
Description   Today take 1.5 tablets, then start taking 1 tablet (5mg ) everyday except 1.5 tablets (7.5mg ) on Mondays.  Recheck INR in 2 weeks. Call us with any medication changes or concerns # [phone] [facility].

## 2018-01-03 ENCOUNTER — Ambulatory Visit (INDEPENDENT_AMBULATORY_CARE_PROVIDER_SITE_OTHER): Payer: Medicare Other | Admitting: *Deleted

## 2018-01-03 ENCOUNTER — Encounter: Payer: Self-pay | Admitting: Cardiology

## 2018-01-03 ENCOUNTER — Ambulatory Visit (INDEPENDENT_AMBULATORY_CARE_PROVIDER_SITE_OTHER): Payer: Medicare Other | Admitting: Cardiology

## 2018-01-03 ENCOUNTER — Encounter (INDEPENDENT_AMBULATORY_CARE_PROVIDER_SITE_OTHER): Payer: Self-pay

## 2018-01-03 ENCOUNTER — Encounter

## 2018-01-03 VITALS — BP 110/70 | HR 61 | Ht 66.0 in | Wt 169.8 lb

## 2018-01-03 DIAGNOSIS — I6523 Occlusion and stenosis of bilateral carotid arteries: Secondary | ICD-10-CM

## 2018-01-03 DIAGNOSIS — Z952 Presence of prosthetic heart valve: Secondary | ICD-10-CM

## 2018-01-03 DIAGNOSIS — E7800 Pure hypercholesterolemia, unspecified: Secondary | ICD-10-CM | POA: Diagnosis not present

## 2018-01-03 DIAGNOSIS — Z7901 Long term (current) use of anticoagulants: Secondary | ICD-10-CM | POA: Diagnosis not present

## 2018-01-03 DIAGNOSIS — I5032 Chronic diastolic (congestive) heart failure: Secondary | ICD-10-CM

## 2018-01-03 DIAGNOSIS — I359 Nonrheumatic aortic valve disorder, unspecified: Secondary | ICD-10-CM

## 2018-01-03 DIAGNOSIS — I739 Peripheral vascular disease, unspecified: Secondary | ICD-10-CM

## 2018-01-03 DIAGNOSIS — I10 Essential (primary) hypertension: Secondary | ICD-10-CM | POA: Diagnosis not present

## 2018-01-03 DIAGNOSIS — Z5181 Encounter for therapeutic drug level monitoring: Secondary | ICD-10-CM

## 2018-01-03 LAB — POCT INR: INR: 3 (ref 2.0–3.0)

## 2018-01-03 NOTE — Progress Notes (Signed)
Cardiology Office Note:    Date:  01/03/2018   ID:  Juan Li, DOB [birth year], MRN [id number]  PCP:  Zenia Resides, MD  Cardiologist:  Nelva Bush, MD  Referring MD: Zenia Resides, MD   Chief Complaint  Patient presents with  . Follow-up    History of Present Illness:    Juan Li is a 84 y.o. male with a past medical history significant for AS s/p mechanical AVR (1993), failure with preserved EF, carotid artery stenosis, PAD, CKD stage III and dementia.  He was last seen in the office by Dr. Saunders Revel on 06/23/17 at which time he was complaining of dizziness, numbness and tingling in the hands and leg weakness.  He had had a recent fall in the shower, striking his head.  He denied any cardiac complaints.  His complaints of weakness and paresthesias were thought to probably be related to cervical spine disease.  He was planning for C-spine surgery.  He is here today for follow-up. He had surgery on his C-spine on 07/11/17. He went to rehab after surgery at [facility]. His wife is here today and says that he got very deconditioned after the surgery. He did have about 1 month of home PT. He does not feel that he made any progress. He still has trouble walking and with balance. He needs another person to walk with him. He is followed by neurology. He does not want to go back to rehab.   He denies chest discomfort, shortness of breath, palpitations, significant lightheadedness, orthopnea, PND or edema. From a cardiac standpoint he feels like he is doing well.   Past Medical History:  Diagnosis Date  . AKI (acute kidney injury) ([place]) 07/29/2017  . Arthritis 02-15-12   osteoarthritis,right knee, pain right wrist.,  . Arthritis    "hands, legs" (12/01/2016)  . Carotid artery disease ([place])    Carotid US 1/18: R 40-59; L 80-99  . Chronic lower back pain   . Complication of anesthesia 02-15-12   very confused, and very slow to awaken after anesthesia  . Coronary artery disease  02-15-12   Heart valve replaced   . Dementia   . Depression   . Hypercholesterolemia 02-15-12  . Hypertension   . Memory loss   . PAD (peripheral artery disease) ([place])   . S/P AVR (aortic valve replacement)    Mechanical [employer] AVR // Echo 12/17: Moderate LVH, EF 60-65, normal wall motion, grade 1 diastolic dysfunction, mechanical AVR okay with mean gradient 8 mmHg, MAC, mild mitral stenosis (mean gradient 2 mmHg), mild MR, mild RAE, PASP 25  . Urinary frequency     Past Surgical History:  Procedure Laterality Date  . ABDOMINAL AORTAGRAM  12/01/2016  . ABDOMINAL AORTOGRAM N/A 12/01/2016   Procedure: ABDOMINAL AORTOGRAM;  Surgeon: Nelva Bush, MD;  Location: [facility];  Service: Cardiovascular;  Laterality: N/A;  . ANTERIOR CERVICAL DECOMP/DISCECTOMY FUSION       Decompression at the level of C3-C4 and C4-C5, allograft and plate from C3 to C%, microscope/notes 09/14/2010  . ANTERIOR CERVICAL DECOMP/DISCECTOMY FUSION N/A 07/11/2017   Procedure: CERVICAL FIVE-SIX ANTERIOR CERVICAL DECOMPRESSION/DISCECTOMY FUSION AND PLATE FIXATION;  Surgeon: Consuella Lose, MD;  Location: [facility];  Service: Neurosurgery;  Laterality: N/A;  CERVICAL FIVE-SIX ANTERIOR CERVICAL DECOMPRESSION/DISCECTOMY FUSION AND PLATE FIXATION  . BACK SURGERY    . CARDIAC VALVE REPLACEMENT    . CARPAL TUNNEL RELEASE Left 01/2003   Archie Endo 09/14/2010  . JOINT REPLACEMENT    .  LOWER EXTREMITY ANGIOGRAPHY  12/01/2016  . LOWER EXTREMITY ANGIOGRAPHY Bilateral 12/01/2016   Procedure: Lower Extremity Angiography;  Surgeon: Nelva Bush, MD;  Location: [facility];  Service: Cardiovascular;  Laterality: Bilateral;  . MECHANICAL AORTIC VALVE REPLACEMENT  1993   Archie Endo 11/18/2016  . SHOULDER OPEN ROTATOR CUFF REPAIR Left 02/15/2012  . TOTAL KNEE ARTHROPLASTY  02/20/2012   Procedure: TOTAL KNEE ARTHROPLASTY;  Surgeon: Gearlean Alf, MD;  Location: [facility];  Service: Orthopedics;  Laterality: Right;  .  TRANSURETHRAL RESECTION OF PROSTATE  02/01/2010   Archie Endo 02/06/2010    Current Medications: Current Meds  Medication Sig  . amLODipine (NORVASC) 5 MG tablet TAKE 1 TABLET BY MOUTH EVERY DAY  . buPROPion (WELLBUTRIN) 75 MG tablet Take 1 tablet (75 mg total) by mouth daily.  . colchicine 0.6 MG tablet Take 0.6 mg by mouth once a week.  . donepezil (ARICEPT) 5 MG tablet Take 1 tablet (5 mg total) by mouth at bedtime.  . finasteride (PROSCAR) 5 MG tablet TAKE 1 TABLET (5 MG TOTAL) BY MOUTH DAILY. (Patient taking differently: take 81m by mouth daily.)  . memantine (NAMENDA) 5 MG tablet TAKE 1 TABLET BY MOUTH TWICE A DAY  . mirabegron ER (MYRBETRIQ) 25 MG TB24 tablet Take 25 mg by mouth daily.  .Marland Kitchenoxybutynin (DITROPAN) 5 MG tablet TAKE 1 TABLET BY MOUTH EVERYDAY AT BEDTIME  . potassium chloride SA (K-DUR,KLOR-CON) 20 MEQ tablet Take 20 mEq by mouth every other day.  . pravastatin (PRAVACHOL) 40 MG tablet TAKE 1 TABLET BY MOUTH EVERY DAY IN THE EVENING  . Sennosides-Docusate Sodium (SENEXON-S PO) Take 1 tablet by mouth daily as needed (constipation).  . tamsulosin (FLOMAX) 0.4 MG CAPS capsule TAKE ONE CAPSULE BY MOUTH EVERY DAY (Patient taking differently: TAKE ONE CAPSULE (0.4 MG) BY MOUTH EVERY DAY)  . Vitamin D, Ergocalciferol, (DRISDOL) 50000 units CAPS capsule Take 50,000 Units by mouth every Wednesday.  . warfarin (COUMADIN) 5 MG tablet Take 5 mg by mouth See admin instructions. Take Tues, Wed, Fri, Sat, Sun.     Allergies:   Patient has no known allergies.   Social History   Socioeconomic History  . Marital status: Married    Spouse name: [place] . Number of children: 3  . Years of education: Not on file  . Highest education level: Not on file  Occupational History  . 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 Financial resource strain: Not on file  . Food insecurity:    Worry: Not on file    Inability: Not on file  . Transportation needs:     Medical: Not on file    Non-medical: Not on file  Tobacco Use  . Smoking status: Former Smoker    Packs/day: 1.00    Years: 50.00    Pack years: 50.00    Types: Cigarettes    Last attempt to quit: 08/10/1995    Years since quitting: 22.4  . Smokeless tobacco: Never Used  Substance and Sexual Activity  . Alcohol use: No    Alcohol/week: 0.0 standard drinks    Frequency: Never    Comment: 12/01/2016 "nothing in the 2000s"  . Drug use: No  . Sexual activity: Not on file  Lifestyle  . Physical activity:    Days per week: Not on file    Minutes per session: Not on file  . Stress: Not on file  Relationships  . Social connections:  Talks on phone: Not on file    Gets together: Not on file    Attends religious service: Not on file    Active member of club or organization: Not on file    Attends meetings of clubs or organizations: Not on file    Relationship status: Not on file  Other Topics Concern  . Not on file  Social History Narrative   Health Care POA: Wife, [place]   Emergency Contact: wife, [place], [phone] (c)   End of Life Plan:   Who lives with you: Patient lives with wife.   Any pets:    Diet: Patient has a varied diet of protein, starch, and vegetables.   Exercise: Patient does not have a regular exercise plan, afraid of falling.   Seatbelts: Patient reports wearing seatbelt when in vehicle.    Sun Exposure/Protection: Patient does not wear sun protection.   Hobbies: Patient likes to do yard work.           Family History: The patient's family history includes Heart attack in his father; Heart disease in his mother; Hyperlipidemia in his father and mother; Hypertension in his father and mother; Varicose Veins in his mother. ROS:   Please see the history of present illness.     All other systems reviewed and are negative.  EKGs/Labs/Other Studies Reviewed:    The following studies were reviewed today:  Normal stress test  03/29/17  Echocardiogram 04/21/2016 Study Conclusions - Left ventricle: The cavity size was normal. Wall thickness was   increased in a pattern of moderate LVH. Systolic function was   normal. The estimated ejection fraction was in the range of 60%   to 65%. Wall motion was normal; there were no regional wall   motion abnormalities. Doppler parameters are consistent with   abnormal left ventricular relaxation (grade 1 diastolic   dysfunction). The E/e&' ratio is >15, suggesting elevated LV   filling pressure. - Aortic valve: Mechanical bileaflet AVR. No obstruction (AT <100,   DVI >0.25). Trivial cleaning AI jet. Mean gradient (S): 8 mm Hg.   Peak gradient (S): 17 mm Hg. - Mitral valve: Calcified annulus. Mildly thickened leaflets. Mild   stenosis. There was mild regurgitation. Pressure half-time: 129   ms. Valve area by pressure half-time: 1.71 cm^2. Valve area by   continuity equation (using LVOT flow): 1.74 cm^2. - Left atrium: The atrium was normal in size. - Right atrium: The atrium was mildly dilated. - Tricuspid valve: There was trivial regurgitation. - Pulmonary arteries: PA peak pressure: 25 mm Hg (S). - Inferior vena cava: The vessel was normal in size. The   respirophasic diameter changes were in the normal range (= 50%),   consistent with normal central venous pressure.  Impressions: - LVEF 60-65%, moderate LVH, normal wall motion, s/p mechanical   bileaflet AVR without obstruction, MAC with mild mitral stenosis   and regurgitation, normal LA, trivial TR, RVSP 25 mmHg, normal   IVC.     EKG:  EKG is not ordered today.    Recent Labs: 07/28/2017: ALT 13 08/01/2017: BUN 30; Creatinine, Ser 1.33; Hemoglobin 12.3; Platelets 305; Potassium 4.5; Sodium 136 08/30/2017: TSH 5.710   Recent Lipid Panel    Component Value Date/Time   CHOL 194 09/14/2016 1141   TRIG 156 (H) 09/14/2016 1141   HDL 42 09/14/2016 1141   CHOLHDL 4.6 09/14/2016 1141   CHOLHDL 4.7 06/10/2015  1501   VLDL 41 (H) 06/10/2015 1501   LDLCALC 121 (  H) 09/14/2016 1141   LDLDIRECT 134.0 02/03/2014 1644    Physical Exam:    VS:  BP 110/70   Pulse 61   Ht 5' 6"  (1.676 m)   Wt 169 lb 12.8 oz (77 kg)   SpO2 98%   BMI 27.41 kg/m     Wt Readings from Last 3 Encounters:  01/03/18 169 lb 12.8 oz (77 kg)  09/27/17 159 lb 3.2 oz (72.2 kg)  08/17/17 130 lb (59 kg)     Physical Exam  Constitutional: He is oriented to person, place, and time. He appears well-developed and well-nourished. No distress.  HENT:  Head: Normocephalic and atraumatic.  Limited ROM of neck  Neck: Normal range of motion. Neck supple. No JVD present.  Cardiovascular: Normal rate, regular rhythm and intact distal pulses. Exam reveals no gallop and no friction rub.  No murmur heard. Mechanical valve, crisp click  Pulmonary/Chest: Effort normal and breath sounds normal. No respiratory distress. He has no wheezes. He has no rales.  Abdominal: Soft. Bowel sounds are normal.  Musculoskeletal: Normal range of motion. He exhibits no edema.  Neurological: He is alert and oriented to person, place, and time.  Skin: Skin is warm and dry.  Psychiatric: He has a normal mood and affect. His behavior is normal. Judgment and thought content normal.  Vitals reviewed.   ASSESSMENT:    1. Chronic diastolic CHF (congestive heart failure) ([place])   2. S/P AVR (aortic valve replacement)   3. Essential hypertension   4. HYPERCHOLESTEROLEMIA   5. Bilateral carotid artery stenosis   6. Peripheral vascular disease ([place])    PLAN:    In order of problems listed above:  Heart failure with preserved EF: Appears euvolemic with no heart failure symptoms.   Aortic stenosis S/P mechanical aortic valve replacement: On chronic anticoagulation with warfarin. No unusual bleeding. Has coumadin clinic appt today.  Last echo in 04/2016 showed normal LV systolic function with normally functioning mechanical aortic valve. Heart sounds with  crisp aortic valve sound.   Hypertension: BP well controlled. Continue current therapy.   Hyperlipidemia: On pravastatin 40 mg daily. Will update lipid panel  Carotid artery stenosis: 95-28% LICA stenosis. Followed by Dr. Scot Dock. No stroke or TIA symptoms.   PAD: arterial studies on 12/01/2016 showed 70% right common iliac artery and 80-90% left popliteal artery stenoses. He is not having significant leg pain currently. On statin and warfarin.   Dr. Saunders Revel will be moving to [facility] and the patient wishes to stay in [place]. I will have him follow up with me in 6 months. If he should need an MD by then I will find him a provider.   Medication Adjustments/Labs and Tests Ordered: Current medicines are reviewed at length with the patient today.  Concerns regarding medicines are outlined above. Labs and tests ordered and medication changes are outlined in the patient instructions below:  Patient Instructions  Medication Instructions:  Your physician recommends that you continue on your current medications as directed. Please refer to the Current Medication list given to you today.   Labwork: TODAY: LIPIDS  Testing/Procedures: None ordered  Follow-Up: Your physician wants you to follow-up in: 6 months with Daune Perch, NP. You will receive a reminder letter in the mail two months in advance. If you don't receive a letter, please call our office to schedule the follow-up appointment.   Any Other Special Instructions Will Be Listed Below (If Applicable).     If you need a refill on your  cardiac medications before your next appointment, please call your pharmacy.      Signed, Daune Perch, NP  01/03/2018 2:37 PM    [facility]

## 2018-01-03 NOTE — Patient Instructions (Addendum)
Medication Instructions:  Your physician recommends that you continue on your current medications as directed. Please refer to the Current Medication list given to you today.   Labwork: TODAY: LIPIDS  Testing/Procedures: None ordered  Follow-Up: Your physician wants you to follow-up in: 6 months with Daune Perch, NP. You will receive a reminder letter in the mail two months in advance. If you don't receive a letter, please call our office to schedule the follow-up appointment.   Any Other Special Instructions Will Be Listed Below (If Applicable).     If you need a refill on your cardiac medications before your next appointment, please call your pharmacy.

## 2018-01-03 NOTE — Patient Instructions (Signed)
Description   Continue  taking 1 tablet (5mg ) everyday except 1.5 tablets (7.5mg ) on Mondays.  Recheck INR in 3 weeks. Call us with any medication changes or concerns # [phone] [facility].

## 2018-01-04 LAB — LIPID PANEL
CHOLESTEROL TOTAL: 191 mg/dL (ref 100–199)
Chol/HDL Ratio: 4.3 ratio (ref 0.0–5.0)
HDL: 44 mg/dL (ref >39)
LDL CALC: 114 mg/dL — AB (ref 0–99)
TRIGLYCERIDES: 167 mg/dL — AB (ref 0–149)
VLDL CHOLESTEROL CAL: 33 mg/dL (ref 5–40)

## 2018-01-23 ENCOUNTER — Ambulatory Visit (INDEPENDENT_AMBULATORY_CARE_PROVIDER_SITE_OTHER): Payer: Medicare Other

## 2018-01-23 DIAGNOSIS — Z952 Presence of prosthetic heart valve: Secondary | ICD-10-CM | POA: Diagnosis not present

## 2018-01-23 DIAGNOSIS — Z5181 Encounter for therapeutic drug level monitoring: Secondary | ICD-10-CM

## 2018-01-23 DIAGNOSIS — I359 Nonrheumatic aortic valve disorder, unspecified: Secondary | ICD-10-CM | POA: Diagnosis not present

## 2018-01-23 DIAGNOSIS — Z7901 Long term (current) use of anticoagulants: Secondary | ICD-10-CM | POA: Diagnosis not present

## 2018-01-23 LAB — POCT INR: INR: 2.6 (ref 2.0–3.0)

## 2018-01-23 NOTE — Patient Instructions (Signed)
Continue  taking 1 tablet (5mg ) everyday except 1.5 tablets (7.5mg ) on Mondays.  Recheck INR in 4 weeks. Call us with any medication changes or concerns # [phone] [facility].

## 2018-01-24 DIAGNOSIS — C44712 Basal cell carcinoma of skin of right lower limb, including hip: Secondary | ICD-10-CM | POA: Diagnosis not present

## 2018-01-25 ENCOUNTER — Telehealth: Payer: Self-pay

## 2018-01-25 DIAGNOSIS — M1611 Unilateral primary osteoarthritis, right hip: Secondary | ICD-10-CM | POA: Diagnosis not present

## 2018-01-25 DIAGNOSIS — M25562 Pain in left knee: Secondary | ICD-10-CM | POA: Diagnosis not present

## 2018-01-25 DIAGNOSIS — M25561 Pain in right knee: Secondary | ICD-10-CM | POA: Diagnosis not present

## 2018-01-25 NOTE — Telephone Encounter (Signed)
Attempted to call and LM.  I will need to speak directly.  I will do due diligence before restarting any narcotic pain medication.

## 2018-01-25 NOTE — Telephone Encounter (Signed)
Pts wife left VM on nurse line requesting a refill on patients hydrocodone. Last dose per wife, back in 2018, and I do not see on med list. Please advise.   [facility] [address]

## 2018-01-26 MED ORDER — HYDROCODONE-ACETAMINOPHEN 5-325 MG PO TABS: 1.0000 | ORAL_TABLET | Freq: Four times a day (QID) | ORAL | 0 refills | Status: DC | PRN

## 2018-01-26 NOTE — Addendum Note (Signed)
Addended by: Zenia Resides on: 01/26/2018 09:40 AM   Modules accepted: Orders

## 2018-01-26 NOTE — Telephone Encounter (Signed)
Called.  I had previously prescribe Hydrocodone/APAP 10/325 for chronic pain.  Last Rx was for 160 tabs and this lasted one year.  Pain is bad.  He also has frequent falls.  And he has had a second neck surgery.   Clearly not abusing.  Will Rx hydrocodone/APAP 5/325

## 2018-01-31 DIAGNOSIS — L570 Actinic keratosis: Secondary | ICD-10-CM | POA: Diagnosis not present

## 2018-02-07 DIAGNOSIS — L570 Actinic keratosis: Secondary | ICD-10-CM | POA: Diagnosis not present

## 2018-02-15 DIAGNOSIS — M1611 Unilateral primary osteoarthritis, right hip: Secondary | ICD-10-CM | POA: Diagnosis not present

## 2018-02-20 ENCOUNTER — Ambulatory Visit (INDEPENDENT_AMBULATORY_CARE_PROVIDER_SITE_OTHER): Payer: Medicare Other

## 2018-02-20 DIAGNOSIS — Z7901 Long term (current) use of anticoagulants: Secondary | ICD-10-CM

## 2018-02-20 DIAGNOSIS — Z5181 Encounter for therapeutic drug level monitoring: Secondary | ICD-10-CM

## 2018-02-20 DIAGNOSIS — I359 Nonrheumatic aortic valve disorder, unspecified: Secondary | ICD-10-CM | POA: Diagnosis not present

## 2018-02-20 DIAGNOSIS — Z952 Presence of prosthetic heart valve: Secondary | ICD-10-CM | POA: Diagnosis not present

## 2018-02-20 LAB — POCT INR: INR: 2.7 (ref 2.0–3.0)

## 2018-02-20 NOTE — Patient Instructions (Signed)
Continue  taking 1 tablet (5mg ) everyday except 1.5 tablets (7.5mg ) on Mondays.  Recheck INR in 5 weeks. Call us with any medication changes or concerns # [phone] [facility].

## 2018-03-21 ENCOUNTER — Other Ambulatory Visit: Payer: Self-pay

## 2018-03-21 ENCOUNTER — Encounter: Payer: Self-pay | Admitting: Family Medicine

## 2018-03-21 ENCOUNTER — Ambulatory Visit (INDEPENDENT_AMBULATORY_CARE_PROVIDER_SITE_OTHER): Payer: Medicare Other | Admitting: Family Medicine

## 2018-03-21 DIAGNOSIS — Z7901 Long term (current) use of anticoagulants: Secondary | ICD-10-CM

## 2018-03-21 DIAGNOSIS — I10 Essential (primary) hypertension: Secondary | ICD-10-CM

## 2018-03-21 DIAGNOSIS — Z23 Encounter for immunization: Secondary | ICD-10-CM | POA: Diagnosis not present

## 2018-03-21 DIAGNOSIS — G959 Disease of spinal cord, unspecified: Secondary | ICD-10-CM

## 2018-03-21 DIAGNOSIS — Z515 Encounter for palliative care: Secondary | ICD-10-CM

## 2018-03-21 DIAGNOSIS — I6523 Occlusion and stenosis of bilateral carotid arteries: Secondary | ICD-10-CM

## 2018-03-21 DIAGNOSIS — M7062 Trochanteric bursitis, left hip: Secondary | ICD-10-CM | POA: Diagnosis not present

## 2018-03-21 NOTE — Patient Instructions (Addendum)
Please do talk to one another about what you would and would not want if a medical emergency happened.   Flu shot today. I will call with lab test results. Call me after Christmas and I will reorder physical therapy.

## 2018-03-22 ENCOUNTER — Encounter: Payer: Self-pay | Admitting: Family Medicine

## 2018-03-22 LAB — BASIC METABOLIC PANEL
BUN / CREAT RATIO: 15 (ref 10–24)
BUN: 23 mg/dL (ref 8–27)
CHLORIDE: 105 mmol/L (ref 96–106)
CO2: 20 mmol/L (ref 20–29)
Calcium: 8.9 mg/dL (ref 8.6–10.2)
Creatinine, Ser: 1.52 mg/dL — ABNORMAL HIGH (ref 0.76–1.27)
GFR, EST AFRICAN AMERICAN: 48 mL/min/{1.73_m2} — AB (ref >59)
GFR, EST NON AFRICAN AMERICAN: 41 mL/min/{1.73_m2} — AB (ref >59)
Glucose: 91 mg/dL (ref 65–99)
POTASSIUM: 4 mmol/L (ref 3.5–5.2)
Sodium: 139 mmol/L (ref 134–144)

## 2018-03-22 LAB — CBC
HEMOGLOBIN: 13.2 g/dL (ref 13.0–17.7)
Hematocrit: 38 % (ref 37.5–51.0)
MCH: 31.8 pg (ref 26.6–33.0)
MCHC: 34.7 g/dL (ref 31.5–35.7)
MCV: 92 fL (ref 79–97)
Platelets: 239 10*3/uL (ref 150–450)
RBC: 4.15 x10E6/uL (ref 4.14–5.80)
RDW: 14.7 % (ref 12.3–15.4)
WBC: 8.8 10*3/uL (ref 3.4–10.8)

## 2018-03-22 NOTE — Progress Notes (Signed)
Established Patient Office Visit  Subjective:  Patient ID: Juan Li, male    DOB: 04/25/1932  Age: 84 y.o. MRN: [id number]  CC:  Chief Complaint  Patient presents with  . Numbness in hand and hip    HPI Juan Li presents for recheck of multiple problems Numbness in hands and hip.  Chronic (he does not recall due to dementia) No change and certainly due to his know Cspine disease and cervical myelopathy.  Doing OK with walker.  No recent falls.  Dementia.  Wife indicates only slow decline.  He is able to carry on a conversation. And cover nicely.  Still drives short distances although I have advised against it.  End of life.  They have living will but I do not have copies.  They have talked only a little.  Past Medical History:  Diagnosis Date  . AKI (acute kidney injury) ([place]) 07/29/2017  . Arthritis 02-15-12   osteoarthritis,right knee, pain right wrist.,  . Arthritis    "hands, legs" (12/01/2016)  . Carotid artery disease ([place])    Carotid US 1/18: R 40-59; L 80-99  . Chronic lower back pain   . Complication of anesthesia 02-15-12   very confused, and very slow to awaken after anesthesia  . Coronary artery disease 02-15-12   Heart valve replaced   . Dementia ([place])   . Depression   . Hypercholesterolemia 02-15-12  . Hypertension   . Memory loss   . PAD (peripheral artery disease) ([place])   . S/P AVR (aortic valve replacement)    Mechanical [employer] AVR // Echo 12/17: Moderate LVH, EF 60-65, normal wall motion, grade 1 diastolic dysfunction, mechanical AVR okay with mean gradient 8 mmHg, MAC, mild mitral stenosis (mean gradient 2 mmHg), mild MR, mild RAE, PASP 25  . Urinary frequency     Past Surgical History:  Procedure Laterality Date  . ABDOMINAL AORTAGRAM  12/01/2016  . ABDOMINAL AORTOGRAM N/A 12/01/2016   Procedure: ABDOMINAL AORTOGRAM;  Surgeon: Nelva Bush, MD;  Location: [facility];  Service: Cardiovascular;  Laterality: N/A;  . ANTERIOR  CERVICAL DECOMP/DISCECTOMY FUSION       Decompression at the level of C3-C4 and C4-C5, allograft and plate from C3 to C%, microscope/notes 09/14/2010  . ANTERIOR CERVICAL DECOMP/DISCECTOMY FUSION N/A 07/11/2017   Procedure: CERVICAL FIVE-SIX ANTERIOR CERVICAL DECOMPRESSION/DISCECTOMY FUSION AND PLATE FIXATION;  Surgeon: Consuella Lose, MD;  Location: [facility];  Service: Neurosurgery;  Laterality: N/A;  CERVICAL FIVE-SIX ANTERIOR CERVICAL DECOMPRESSION/DISCECTOMY FUSION AND PLATE FIXATION  . BACK SURGERY    . CARDIAC VALVE REPLACEMENT    . CARPAL TUNNEL RELEASE Left 01/2003   Archie Endo 09/14/2010  . JOINT REPLACEMENT    . LOWER EXTREMITY ANGIOGRAPHY  12/01/2016  . LOWER EXTREMITY ANGIOGRAPHY Bilateral 12/01/2016   Procedure: Lower Extremity Angiography;  Surgeon: Nelva Bush, MD;  Location: [facility];  Service: Cardiovascular;  Laterality: Bilateral;  . MECHANICAL AORTIC VALVE REPLACEMENT  1993   Archie Endo 11/18/2016  . SHOULDER OPEN ROTATOR CUFF REPAIR Left 02/15/2012  . TOTAL KNEE ARTHROPLASTY  02/20/2012   Procedure: TOTAL KNEE ARTHROPLASTY;  Surgeon: Gearlean Alf, MD;  Location: [facility];  Service: Orthopedics;  Laterality: Right;  . TRANSURETHRAL RESECTION OF PROSTATE  02/01/2010   Archie Endo 02/06/2010    Family History  Problem Relation Age of Onset  . Heart disease Mother   . Hyperlipidemia Mother   . Hypertension Mother   . Varicose Veins Mother   . Hyperlipidemia Father   .  Hypertension Father   . Heart attack Father     Social History   Socioeconomic History  . Marital status: Married    Spouse name: [name]  . Number of children: 3  . Years of education: Not on file  . Highest education level: Not on file  Occupational History  . 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 Financial resource strain: Not on file  . Food insecurity:    Worry: Not on file    Inability: Not on file  . Transportation needs:    Medical: Not on  file    Non-medical: Not on file  Tobacco Use  . Smoking status: Former Smoker    Packs/day: 1.00    Years: 50.00    Pack years: 50.00    Types: Cigarettes    Last attempt to quit: 08/10/1995    Years since quitting: 22.6  . Smokeless tobacco: Never Used  Substance and Sexual Activity  . Alcohol use: No    Alcohol/week: 0.0 standard drinks    Frequency: Never    Comment: 12/01/2016 "nothing in the 2000s"  . Drug use: No  . Sexual activity: Not on file  Lifestyle  . Physical activity:    Days per week: Not on file    Minutes per session: Not on file  . Stress: Not on file  Relationships  . Social connections:    Talks on phone: Not on file    Gets together: Not on file    Attends religious service: Not on file    Active member of club or organization: Not on file    Attends meetings of clubs or organizations: Not on file    Relationship status: Not on file  . Intimate partner violence:    Fear of current or ex partner: Not on file    Emotionally abused: Not on file    Physically abused: Not on file    Forced sexual activity: Not on file  Other Topics Concern  . Not on file  Social History Narrative   Health Care POA: Wife, [place]   Emergency Contact: wife, [place], [phone] (c)   End of Life Plan:   Who lives with you: Patient lives with wife.   Any pets:    Diet: Patient has a varied diet of protein, starch, and vegetables.   Exercise: Patient does not have a regular exercise plan, afraid of falling.   Seatbelts: Patient reports wearing seatbelt when in vehicle.    Sun Exposure/Protection: Patient does not wear sun protection.   Hobbies: Patient likes to do yard work.          Outpatient Medications Prior to Visit  Medication Sig Dispense Refill  . amLODipine (NORVASC) 5 MG tablet TAKE 1 TABLET BY MOUTH EVERY DAY 90 tablet 3  . buPROPion (WELLBUTRIN) 75 MG tablet Take 1 tablet (75 mg total) by mouth daily. 90 tablet 3  . colchicine 0.6 MG  tablet Take 0.6 mg by mouth once a week.    . donepezil (ARICEPT) 5 MG tablet Take 1 tablet (5 mg total) by mouth at bedtime. 90 tablet 3  . finasteride (PROSCAR) 5 MG tablet TAKE 1 TABLET (5 MG TOTAL) BY MOUTH DAILY. (Patient taking differently: take 106m by mouth daily.) 90 tablet 3  . HYDROcodone-acetaminophen (NORCO/VICODIN) 5-325 MG tablet Take 1 tablet by mouth every 6 (six) hours as needed for moderate pain. For chronic pain 100 tablet 0  .  memantine (NAMENDA) 5 MG tablet TAKE 1 TABLET BY MOUTH TWICE A DAY 180 tablet 3  . mirabegron ER (MYRBETRIQ) 25 MG TB24 tablet Take 25 mg by mouth daily.    Marland Kitchen oxybutynin (DITROPAN) 5 MG tablet TAKE 1 TABLET BY MOUTH EVERYDAY AT BEDTIME 90 tablet 3  . potassium chloride SA (K-DUR,KLOR-CON) 20 MEQ tablet Take 20 mEq by mouth every other day.    . pravastatin (PRAVACHOL) 40 MG tablet TAKE 1 TABLET BY MOUTH EVERY DAY IN THE EVENING 90 tablet 3  . Sennosides-Docusate Sodium (SENEXON-S PO) Take 1 tablet by mouth daily as needed (constipation).    . tamsulosin (FLOMAX) 0.4 MG CAPS capsule TAKE ONE CAPSULE BY MOUTH EVERY DAY (Patient taking differently: TAKE ONE CAPSULE (0.4 MG) BY MOUTH EVERY DAY) 90 capsule 3  . Vitamin D, Ergocalciferol, (DRISDOL) 50000 units CAPS capsule Take 50,000 Units by mouth every Wednesday.    . warfarin (COUMADIN) 5 MG tablet Take 5 mg by mouth See admin instructions. Take Tues, Wed, Fri, Sat, Sun.     No facility-administered medications prior to visit.     No Known Allergies  ROS Review of Systems    Objective:    Physical Exam  BP (!) 140/56   Pulse 62   Temp 98.3 F (36.8 C) (Oral)   Ht 5' 6"  (1.676 m)   Wt 171 lb 12.8 oz (77.9 kg)   SpO2 97%   BMI 27.73 kg/m  Wt Readings from Last 3 Encounters:  03/21/18 171 lb 12.8 oz (77.9 kg)  01/03/18 169 lb 12.8 oz (77 kg)  09/27/17 159 lb 3.2 oz (72.2 kg)   Gait is broadbased and stable with walker.  Does not have upright posture.  (encouraged) Nicely stable with  turn.  There are no preventive care reminders to display for this patient.  There are no preventive care reminders to display for this patient.  Lab Results  Component Value Date   TSH 5.710 (H) 08/30/2017   Lab Results  Component Value Date   WBC 8.8 03/21/2018   HGB 13.2 03/21/2018   HCT 38.0 03/21/2018   MCV 92 03/21/2018   PLT 239 03/21/2018   Lab Results  Component Value Date   NA 139 03/21/2018   K 4.0 03/21/2018   CO2 20 03/21/2018   GLUCOSE 91 03/21/2018   BUN 23 03/21/2018   CREATININE 1.52 (H) 03/21/2018   BILITOT 0.9 07/28/2017   ALKPHOS 59 07/28/2017   AST 18 07/28/2017   ALT 13 (L) 07/28/2017   PROT 7.3 07/28/2017   ALBUMIN 3.7 07/28/2017   CALCIUM 8.9 03/21/2018   ANIONGAP 8 08/01/2017   GFR 60.67 06/02/2014   Lab Results  Component Value Date   CHOL 191 01/03/2018   Lab Results  Component Value Date   HDL 44 01/03/2018   Lab Results  Component Value Date   LDLCALC 114 (H) 01/03/2018   Lab Results  Component Value Date   TRIG 167 (H) 01/03/2018   Lab Results  Component Value Date   CHOLHDL 4.3 01/03/2018   Lab Results  Component Value Date   HGBA1C 5.6 07/27/2017      Assessment & Plan:   Problem List Items Addressed This Visit    Essential hypertension   Relevant Orders   Basic metabolic panel (Completed)   Chronic anticoagulation   Relevant Orders   CBC (Completed)    Other Visit Diagnoses    Need for immunization against influenza  Relevant Orders   Flu Vaccine QUAD 36+ mos IM (Completed)      No orders of the defined types were placed in this encounter.   Follow-up: No follow-ups on file.    Zenia Resides, MD

## 2018-03-22 NOTE — Assessment & Plan Note (Signed)
Reaffirmed statements in overview.  He is still able to participate in decision making.

## 2018-03-22 NOTE — Assessment & Plan Note (Signed)
Check labs.  Stable on current meds.

## 2018-03-22 NOTE — Assessment & Plan Note (Signed)
Chronic.  Well compensated.  Offered another round of PT.  They want to wait until after the holidays.

## 2018-03-27 ENCOUNTER — Ambulatory Visit (INDEPENDENT_AMBULATORY_CARE_PROVIDER_SITE_OTHER): Payer: Medicare Other

## 2018-03-27 DIAGNOSIS — Z952 Presence of prosthetic heart valve: Secondary | ICD-10-CM | POA: Diagnosis not present

## 2018-03-27 DIAGNOSIS — Z5181 Encounter for therapeutic drug level monitoring: Secondary | ICD-10-CM | POA: Diagnosis not present

## 2018-03-27 DIAGNOSIS — Z7901 Long term (current) use of anticoagulants: Secondary | ICD-10-CM

## 2018-03-27 DIAGNOSIS — I359 Nonrheumatic aortic valve disorder, unspecified: Secondary | ICD-10-CM | POA: Diagnosis not present

## 2018-03-27 LAB — POCT INR: INR: 2.8 (ref 2.0–3.0)

## 2018-03-27 NOTE — Patient Instructions (Signed)
Continue  taking 1 tablet (5mg ) everyday except 1.5 tablets (7.5mg ) on Mondays.  Recheck INR in 6 weeks. Call us with any medication changes or concerns # [phone] [facility].

## 2018-04-17 DIAGNOSIS — L821 Other seborrheic keratosis: Secondary | ICD-10-CM | POA: Diagnosis not present

## 2018-04-17 DIAGNOSIS — D485 Neoplasm of uncertain behavior of skin: Secondary | ICD-10-CM | POA: Diagnosis not present

## 2018-04-17 DIAGNOSIS — Z85828 Personal history of other malignant neoplasm of skin: Secondary | ICD-10-CM | POA: Diagnosis not present

## 2018-04-17 DIAGNOSIS — C44519 Basal cell carcinoma of skin of other part of trunk: Secondary | ICD-10-CM | POA: Diagnosis not present

## 2018-04-17 DIAGNOSIS — L570 Actinic keratosis: Secondary | ICD-10-CM | POA: Diagnosis not present

## 2018-04-26 DIAGNOSIS — M47896 Other spondylosis, lumbar region: Secondary | ICD-10-CM | POA: Diagnosis not present

## 2018-04-26 DIAGNOSIS — M545 Low back pain: Secondary | ICD-10-CM | POA: Diagnosis not present

## 2018-04-26 DIAGNOSIS — M7062 Trochanteric bursitis, left hip: Secondary | ICD-10-CM | POA: Diagnosis not present

## 2018-04-27 DIAGNOSIS — M545 Low back pain: Secondary | ICD-10-CM | POA: Diagnosis not present

## 2018-05-07 DIAGNOSIS — M5416 Radiculopathy, lumbar region: Secondary | ICD-10-CM | POA: Diagnosis not present

## 2018-05-07 DIAGNOSIS — M5136 Other intervertebral disc degeneration, lumbar region: Secondary | ICD-10-CM | POA: Diagnosis not present

## 2018-05-07 DIAGNOSIS — M4310 Spondylolisthesis, site unspecified: Secondary | ICD-10-CM | POA: Diagnosis not present

## 2018-05-08 ENCOUNTER — Ambulatory Visit (INDEPENDENT_AMBULATORY_CARE_PROVIDER_SITE_OTHER): Payer: Medicare Other

## 2018-05-08 DIAGNOSIS — Z7901 Long term (current) use of anticoagulants: Secondary | ICD-10-CM

## 2018-05-08 DIAGNOSIS — Z952 Presence of prosthetic heart valve: Secondary | ICD-10-CM | POA: Diagnosis not present

## 2018-05-08 DIAGNOSIS — Z5181 Encounter for therapeutic drug level monitoring: Secondary | ICD-10-CM

## 2018-05-08 DIAGNOSIS — I359 Nonrheumatic aortic valve disorder, unspecified: Secondary | ICD-10-CM | POA: Diagnosis not present

## 2018-05-08 LAB — POCT INR: INR: 1.1 — AB (ref 2.0–3.0)

## 2018-05-08 NOTE — Patient Instructions (Signed)
Please take 1.5 tablets today, tomorrow & Thursday, then resume taking 1 tablet (5mg ) everyday except 1.5 tablets (7.5mg ) on Mondays.  Recheck INR in 1 week. Call us with any medication changes or concerns # [phone] [facility].

## 2018-05-15 ENCOUNTER — Ambulatory Visit (INDEPENDENT_AMBULATORY_CARE_PROVIDER_SITE_OTHER): Payer: Medicare Other

## 2018-05-15 DIAGNOSIS — Z7901 Long term (current) use of anticoagulants: Secondary | ICD-10-CM

## 2018-05-15 DIAGNOSIS — Z952 Presence of prosthetic heart valve: Secondary | ICD-10-CM | POA: Diagnosis not present

## 2018-05-15 DIAGNOSIS — Z5181 Encounter for therapeutic drug level monitoring: Secondary | ICD-10-CM | POA: Diagnosis not present

## 2018-05-15 DIAGNOSIS — I359 Nonrheumatic aortic valve disorder, unspecified: Secondary | ICD-10-CM

## 2018-05-15 LAB — POCT INR: INR: 3.1 — AB (ref 2.0–3.0)

## 2018-05-15 NOTE — Patient Instructions (Signed)
Please continue taking 1 tablet (5mg ) everyday except 1.5 tablets (7.5mg ) on Mondays.  Recheck INR in 4 weeks. Call us with any medication changes or concerns # [phone] [facility].

## 2018-05-17 DIAGNOSIS — M5416 Radiculopathy, lumbar region: Secondary | ICD-10-CM | POA: Diagnosis not present

## 2018-05-17 DIAGNOSIS — M4310 Spondylolisthesis, site unspecified: Secondary | ICD-10-CM | POA: Diagnosis not present

## 2018-06-04 ENCOUNTER — Ambulatory Visit (HOSPITAL_COMMUNITY)
Admission: RE | Admit: 2018-06-04 | Payer: Medicare Other | Source: Ambulatory Visit | Attending: Cardiovascular Disease | Admitting: Cardiovascular Disease

## 2018-06-08 DIAGNOSIS — M5136 Other intervertebral disc degeneration, lumbar region: Secondary | ICD-10-CM | POA: Diagnosis not present

## 2018-06-08 DIAGNOSIS — M4310 Spondylolisthesis, site unspecified: Secondary | ICD-10-CM | POA: Diagnosis not present

## 2018-06-12 ENCOUNTER — Ambulatory Visit (INDEPENDENT_AMBULATORY_CARE_PROVIDER_SITE_OTHER): Payer: Medicare Other

## 2018-06-12 DIAGNOSIS — I359 Nonrheumatic aortic valve disorder, unspecified: Secondary | ICD-10-CM | POA: Diagnosis not present

## 2018-06-12 DIAGNOSIS — Z7901 Long term (current) use of anticoagulants: Secondary | ICD-10-CM | POA: Diagnosis not present

## 2018-06-12 DIAGNOSIS — Z952 Presence of prosthetic heart valve: Secondary | ICD-10-CM | POA: Diagnosis not present

## 2018-06-12 DIAGNOSIS — Z5181 Encounter for therapeutic drug level monitoring: Secondary | ICD-10-CM

## 2018-06-12 LAB — POCT INR: INR: 2.8 (ref 2.0–3.0)

## 2018-06-12 NOTE — Patient Instructions (Signed)
Please continue taking 1 tablet (5mg ) everyday except 1.5 tablets (7.5mg ) on Mondays.  Recheck INR in 5 weeks. Call us with any medication changes or concerns # [phone] [facility].

## 2018-06-13 ENCOUNTER — Ambulatory Visit (HOSPITAL_COMMUNITY)
Admission: RE | Admit: 2018-06-13 | Discharge: 2018-06-13 | Disposition: A | Discharge status: Home / Self Care | Payer: Medicare Other | Source: Ambulatory Visit | Attending: Cardiovascular Disease | Admitting: Cardiovascular Disease

## 2018-06-13 DIAGNOSIS — I6523 Occlusion and stenosis of bilateral carotid arteries: Secondary | ICD-10-CM | POA: Insufficient documentation

## 2018-06-15 DIAGNOSIS — R351 Nocturia: Secondary | ICD-10-CM | POA: Diagnosis not present

## 2018-06-15 DIAGNOSIS — N401 Enlarged prostate with lower urinary tract symptoms: Secondary | ICD-10-CM | POA: Diagnosis not present

## 2018-06-27 ENCOUNTER — Telehealth: Payer: Self-pay

## 2018-06-27 DIAGNOSIS — I6522 Occlusion and stenosis of left carotid artery: Secondary | ICD-10-CM

## 2018-06-27 NOTE — Telephone Encounter (Signed)
-----   Message from Josue Hector, MD sent at 06/13/2018  1:51 PM EST ----- Left ICA 60-79% stenosis.  No TIA symptoms.  Continue antiplatelet Rx and F/U carotid duplex in 6 months

## 2018-06-27 NOTE — Telephone Encounter (Signed)
Patient aware of results. Per Dr. Johnsie Cancel, Left ICA 60-79% stenosis.  No TIA symptoms.  Continue antiplatelet Rx and F/U carotid duplex in 6 months. Patient verbalized understanding. Will send message to scheduling.

## 2018-07-04 ENCOUNTER — Telehealth: Payer: Self-pay | Admitting: Cardiovascular Disease

## 2018-07-04 NOTE — Telephone Encounter (Signed)
New Message   Patients wife is calling on his behalf. They misplaced the disc with carotid and they would like another disc.

## 2018-07-04 NOTE — Telephone Encounter (Signed)
Will send to Medical Records to see if they can help.

## 2018-07-05 NOTE — Telephone Encounter (Signed)
Routing to [facility] medical records department.

## 2018-07-17 ENCOUNTER — Other Ambulatory Visit: Payer: Self-pay

## 2018-07-17 ENCOUNTER — Ambulatory Visit (INDEPENDENT_AMBULATORY_CARE_PROVIDER_SITE_OTHER): Payer: Medicare Other | Admitting: Pharmacist

## 2018-07-17 DIAGNOSIS — Z5181 Encounter for therapeutic drug level monitoring: Secondary | ICD-10-CM

## 2018-07-17 DIAGNOSIS — Z952 Presence of prosthetic heart valve: Secondary | ICD-10-CM

## 2018-07-17 DIAGNOSIS — Z7901 Long term (current) use of anticoagulants: Secondary | ICD-10-CM

## 2018-07-17 DIAGNOSIS — I359 Nonrheumatic aortic valve disorder, unspecified: Secondary | ICD-10-CM | POA: Diagnosis not present

## 2018-07-17 LAB — POCT INR: INR: 5.4 — AB (ref 2.0–3.0)

## 2018-07-17 NOTE — Patient Instructions (Signed)
Description   No warfarin tonight, then continue taking 1 tablet (5mg ) everyday except 1.5 tablets (7.5mg ) on Mondays.  Recheck INR in 3 weeks. Call us with any medication changes or concerns # [phone] [facility].

## 2018-07-20 ENCOUNTER — Other Ambulatory Visit: Payer: Self-pay | Admitting: Family Medicine

## 2018-08-06 ENCOUNTER — Telehealth: Payer: Self-pay | Admitting: *Deleted

## 2018-08-06 NOTE — Telephone Encounter (Signed)

## 2018-08-07 ENCOUNTER — Other Ambulatory Visit: Payer: Self-pay

## 2018-08-07 ENCOUNTER — Ambulatory Visit (INDEPENDENT_AMBULATORY_CARE_PROVIDER_SITE_OTHER): Payer: Medicare Other | Admitting: *Deleted

## 2018-08-07 DIAGNOSIS — Z5181 Encounter for therapeutic drug level monitoring: Secondary | ICD-10-CM

## 2018-08-07 DIAGNOSIS — I359 Nonrheumatic aortic valve disorder, unspecified: Secondary | ICD-10-CM | POA: Diagnosis not present

## 2018-08-07 DIAGNOSIS — Z952 Presence of prosthetic heart valve: Secondary | ICD-10-CM | POA: Diagnosis not present

## 2018-08-07 DIAGNOSIS — Z7901 Long term (current) use of anticoagulants: Secondary | ICD-10-CM | POA: Diagnosis not present

## 2018-08-07 LAB — POCT INR: INR: 6 — AB (ref 2.0–3.0)

## 2018-08-12 ENCOUNTER — Other Ambulatory Visit: Payer: Self-pay | Admitting: Family Medicine

## 2018-08-16 ENCOUNTER — Telehealth: Payer: Self-pay

## 2018-08-16 NOTE — Telephone Encounter (Signed)
lmom for prescreen  

## 2018-08-17 ENCOUNTER — Ambulatory Visit (INDEPENDENT_AMBULATORY_CARE_PROVIDER_SITE_OTHER): Payer: Medicare Other | Admitting: *Deleted

## 2018-08-17 ENCOUNTER — Other Ambulatory Visit: Payer: Self-pay

## 2018-08-17 DIAGNOSIS — Z7901 Long term (current) use of anticoagulants: Secondary | ICD-10-CM

## 2018-08-17 DIAGNOSIS — Z952 Presence of prosthetic heart valve: Secondary | ICD-10-CM

## 2018-08-17 DIAGNOSIS — Z5181 Encounter for therapeutic drug level monitoring: Secondary | ICD-10-CM

## 2018-08-17 DIAGNOSIS — I359 Nonrheumatic aortic valve disorder, unspecified: Secondary | ICD-10-CM

## 2018-08-17 LAB — POCT INR: INR: 3.3 — AB (ref 2.0–3.0)

## 2018-08-20 ENCOUNTER — Other Ambulatory Visit: Payer: Self-pay | Admitting: Family Medicine

## 2018-08-22 DIAGNOSIS — M5126 Other intervertebral disc displacement, lumbar region: Secondary | ICD-10-CM | POA: Diagnosis not present

## 2018-08-30 ENCOUNTER — Other Ambulatory Visit: Payer: Self-pay

## 2018-08-30 ENCOUNTER — Ambulatory Visit (INDEPENDENT_AMBULATORY_CARE_PROVIDER_SITE_OTHER): Payer: Medicare Other | Admitting: Pharmacist

## 2018-08-30 DIAGNOSIS — Z6828 Body mass index (BMI) 28.0-28.9, adult: Secondary | ICD-10-CM | POA: Diagnosis not present

## 2018-08-30 DIAGNOSIS — Z952 Presence of prosthetic heart valve: Secondary | ICD-10-CM | POA: Diagnosis not present

## 2018-08-30 DIAGNOSIS — I10 Essential (primary) hypertension: Secondary | ICD-10-CM | POA: Diagnosis not present

## 2018-08-30 DIAGNOSIS — Z5181 Encounter for therapeutic drug level monitoring: Secondary | ICD-10-CM

## 2018-08-30 DIAGNOSIS — Z7901 Long term (current) use of anticoagulants: Secondary | ICD-10-CM

## 2018-08-30 DIAGNOSIS — I359 Nonrheumatic aortic valve disorder, unspecified: Secondary | ICD-10-CM

## 2018-08-30 DIAGNOSIS — F0390 Unspecified dementia without behavioral disturbance: Secondary | ICD-10-CM | POA: Diagnosis not present

## 2018-08-30 DIAGNOSIS — M48062 Spinal stenosis, lumbar region with neurogenic claudication: Secondary | ICD-10-CM | POA: Diagnosis not present

## 2018-08-30 LAB — POCT INR: INR: 2.6 (ref 2.0–3.0)

## 2018-09-04 ENCOUNTER — Telehealth: Payer: Self-pay | Admitting: Nurse Practitioner

## 2018-09-04 DIAGNOSIS — G5603 Carpal tunnel syndrome, bilateral upper limbs: Secondary | ICD-10-CM | POA: Diagnosis not present

## 2018-09-04 NOTE — Telephone Encounter (Signed)
   [facility] Pre-operative Risk Assessment    Request for surgical clearance:  1. What type of surgery is being performed? Injection  2. When is this surgery scheduled? TBD   3. What type of clearance is required (medical clearance vs. Pharmacy clearance to hold med vs. Both)? Both  4. Are there any medications that need to be held prior to surgery and how long? Hold Coumadin for 1 day and does patient need Lovenox bridge   Practice name and name of physician performing surgery? Dr. Brien Few, [facility]  5. What is your office phone number [phone]    7.   What is your office fax number [phone]  8.   Anesthesia type (None, local, MAC, general) ? Not indicated   Emmaline Life 09/04/2018, 5:11 PM  _________________________________________________________________   (provider comments below)

## 2018-09-05 NOTE — Telephone Encounter (Signed)
Patient with diagnosis of aortic valve replacement on warfarin for anticoagulation.    Procedure: Spinal Injection Date of procedure: TBD  CrCl 40 ml/min Platelet count 239  Per office protocol, patient can hold warfarin for 5 days prior to procedure.   Patient will need bridging with Lovenox (enoxaparin) 1mg /kg every 12 hours around procedure.   Will coordinate Lovenox bridge in Coumadin clinic. Patient will need to come in to the clinic one week before the procedure to review Lovenox instructions.   Gwenlyn Found, [place] D PGY1 Pharmacy Resident  09/05/2018   10:15 AM

## 2018-09-05 NOTE — Telephone Encounter (Signed)
   Primary Cardiologist: Nelva Bush, MD  Chart reviewed as part of pre-operative protocol coverage. Patient was contacted 09/05/2018 in reference to pre-operative risk assessment for pending surgery as outlined below.  Juan Li was last seen on 01/04/2019 by Pecolia Ades, NP.  Since that day, Juan Li has done well from a cardiac standpoint. He is quite limited in mobility due to back pain and reports needing assistance for ambulation. He is able to perform ADLs unassisted (showering, shaving, dressing, etc.) without anginal complaints.  Therefore, based on ACC/AHA guidelines, the patient would be at acceptable risk for the planned procedure without further cardiovascular testing.   Per pharmacy recommendations, patient can hold coumadin for 5 days prior to his injection. He will need to be bridged with lovenox. The coumadin clinic will contact him to coordinate this prior to his procedure. He should restart coumadin when cleared to do so by Dr. Brien Few.   I will route this recommendation to the requesting party via Epic fax function and remove from pre-op pool.  Please call with questions.  Abigail Butts, PA-C 09/05/2018, 11:31 AM

## 2018-09-07 ENCOUNTER — Telehealth: Payer: Self-pay | Admitting: Internal Medicine

## 2018-09-07 NOTE — Telephone Encounter (Signed)
Wife of Pt called. She states that her husband needs to get a series of shots before an upcoming procedure. She was calling for clarification because the message cut off.

## 2018-09-10 NOTE — Telephone Encounter (Signed)
Spoke with patient wife, states currently his injection is scheduled for Wed, however this does not allow enough time to hold coumadin 5 days. Advised she call and reschedule for no earlier than next Monday 5/18. Asked wife to call us back to let us know the date so we can schedule him for an INR check and to review his lovenox injections.

## 2018-09-10 NOTE — Telephone Encounter (Signed)
Wife called back. Injection is set for 5/20. Patient will come in 5/14 for INR check and instructions for bridge.

## 2018-09-12 ENCOUNTER — Telehealth: Payer: Self-pay

## 2018-09-12 NOTE — Telephone Encounter (Signed)
Unable to lmom doesn't accept blocked calls 

## 2018-09-13 ENCOUNTER — Ambulatory Visit (INDEPENDENT_AMBULATORY_CARE_PROVIDER_SITE_OTHER): Payer: Medicare Other | Admitting: Pharmacist

## 2018-09-13 DIAGNOSIS — Z7901 Long term (current) use of anticoagulants: Secondary | ICD-10-CM

## 2018-09-13 DIAGNOSIS — Z952 Presence of prosthetic heart valve: Secondary | ICD-10-CM | POA: Diagnosis not present

## 2018-09-13 DIAGNOSIS — I359 Nonrheumatic aortic valve disorder, unspecified: Secondary | ICD-10-CM

## 2018-09-13 DIAGNOSIS — Z5181 Encounter for therapeutic drug level monitoring: Secondary | ICD-10-CM | POA: Diagnosis not present

## 2018-09-13 LAB — POCT INR: INR: 2.2 (ref 2.0–3.0)

## 2018-09-13 MED ORDER — ENOXAPARIN SODIUM 80 MG/0.8ML ~~LOC~~ SOLN: 80.0000 mg | Freq: Two times a day (BID) | SUBCUTANEOUS | 1 refills | Status: DC

## 2018-09-13 NOTE — Patient Instructions (Addendum)
5/14: Inject Lovenox 80 mg in the fatty abdominal tissue at least 2 inches from the belly button once today at 8pm rotate sites. No Coumadin.  5/15: Inject Lovenox 80 mg in the fatty abdominal tissue at least 2 inches from the belly button twice a day about 12 hours apart, 8am and 8pm rotate sites. No Coumadin.  5/16: Inject Lovenox in the fatty tissue every 12 hours, 8am and 8pm. No Coumadin.  5/17: Inject Lovenox in the fatty tissue every 12 hours, 8am and 8pm. No Coumadin.  5/18: Inject Lovenox in the fatty tissue every 12 hours, 8am and 8pm. No Coumadin.  5/19: Inject Lovenox in the fatty tissue in the morning at 8 am (No PM dose). No Coumadin. Coumadin appt to check INR.   5/20: Procedure Day - No Lovenox - Resume Coumadin in the evening or as directed by doctor  5/21: Resume Lovenox inject in the fatty tissue every 12 hours and take Coumadin.  5/22: Inject Lovenox in the fatty tissue every 12 hours and take Coumadin.  5/23: Inject Lovenox in the fatty tissue every 12 hours and take Coumadin.  5/24: Inject Lovenox in the fatty tissue every 12 hours and take Coumadin.  5/25: Inject Lovenox in the fatty tissue every 12 hours and take Coumadin.  5/26: Coumadin appt to check INR.

## 2018-09-14 ENCOUNTER — Other Ambulatory Visit: Payer: Self-pay

## 2018-09-17 ENCOUNTER — Telehealth: Payer: Self-pay

## 2018-09-17 NOTE — Telephone Encounter (Signed)

## 2018-09-18 ENCOUNTER — Ambulatory Visit (INDEPENDENT_AMBULATORY_CARE_PROVIDER_SITE_OTHER): Payer: Medicare Other | Admitting: *Deleted

## 2018-09-18 ENCOUNTER — Other Ambulatory Visit: Payer: Self-pay

## 2018-09-18 DIAGNOSIS — I359 Nonrheumatic aortic valve disorder, unspecified: Secondary | ICD-10-CM

## 2018-09-18 DIAGNOSIS — Z7901 Long term (current) use of anticoagulants: Secondary | ICD-10-CM

## 2018-09-18 DIAGNOSIS — Z5181 Encounter for therapeutic drug level monitoring: Secondary | ICD-10-CM

## 2018-09-18 DIAGNOSIS — Z952 Presence of prosthetic heart valve: Secondary | ICD-10-CM | POA: Diagnosis not present

## 2018-09-18 LAB — POCT INR: INR: 1.2 — AB (ref 2.0–3.0)

## 2018-09-18 NOTE — Patient Instructions (Addendum)
Description   Spoke with wife and instructed pt to continue holding Coumadin for procedure on 09/19/18 and continue Lovenox Bridge.  See instruction below for resuming Coumadin. Normal dose: one tablet daily.  Recheck INR on 5/26. Call us with any medication changes or concerns # [phone] [facility].

## 2018-09-19 DIAGNOSIS — Z6827 Body mass index (BMI) 27.0-27.9, adult: Secondary | ICD-10-CM | POA: Diagnosis not present

## 2018-09-19 DIAGNOSIS — M545 Low back pain: Secondary | ICD-10-CM | POA: Diagnosis not present

## 2018-09-19 DIAGNOSIS — I10 Essential (primary) hypertension: Secondary | ICD-10-CM | POA: Diagnosis not present

## 2018-09-20 ENCOUNTER — Telehealth: Payer: Self-pay | Admitting: *Deleted

## 2018-09-20 NOTE — Telephone Encounter (Signed)
Wife called and stated the pt was instructed by Dr. Brien Few to resume Lovenox at 12noon today. Pt will push the next dose out farther to 12 hours from that point.

## 2018-09-21 ENCOUNTER — Telehealth: Payer: Self-pay

## 2018-09-21 NOTE — Telephone Encounter (Signed)

## 2018-09-25 ENCOUNTER — Other Ambulatory Visit: Payer: Self-pay

## 2018-09-25 ENCOUNTER — Ambulatory Visit (INDEPENDENT_AMBULATORY_CARE_PROVIDER_SITE_OTHER): Payer: Medicare Other | Admitting: Pharmacist

## 2018-09-25 DIAGNOSIS — Z952 Presence of prosthetic heart valve: Secondary | ICD-10-CM | POA: Diagnosis not present

## 2018-09-25 DIAGNOSIS — Z7901 Long term (current) use of anticoagulants: Secondary | ICD-10-CM

## 2018-09-25 DIAGNOSIS — I359 Nonrheumatic aortic valve disorder, unspecified: Secondary | ICD-10-CM

## 2018-09-25 DIAGNOSIS — Z5181 Encounter for therapeutic drug level monitoring: Secondary | ICD-10-CM

## 2018-09-25 LAB — POCT INR: INR: 1.2 — AB (ref 2.0–3.0)

## 2018-09-26 ENCOUNTER — Telehealth: Payer: Self-pay

## 2018-09-26 NOTE — Telephone Encounter (Signed)
lpmtcb in regards to Virtual Visit on 5/28.

## 2018-09-27 ENCOUNTER — Telehealth (INDEPENDENT_AMBULATORY_CARE_PROVIDER_SITE_OTHER): Payer: Medicare Other | Admitting: Cardiology

## 2018-09-27 ENCOUNTER — Other Ambulatory Visit: Payer: Self-pay

## 2018-09-27 ENCOUNTER — Telehealth: Payer: Self-pay | Admitting: Pharmacist

## 2018-09-27 ENCOUNTER — Encounter: Payer: Self-pay | Admitting: Cardiology

## 2018-09-27 VITALS — Ht 66.0 in | Wt 175.0 lb

## 2018-09-27 DIAGNOSIS — Z952 Presence of prosthetic heart valve: Secondary | ICD-10-CM

## 2018-09-27 DIAGNOSIS — I5030 Unspecified diastolic (congestive) heart failure: Secondary | ICD-10-CM | POA: Diagnosis not present

## 2018-09-27 DIAGNOSIS — I6522 Occlusion and stenosis of left carotid artery: Secondary | ICD-10-CM

## 2018-09-27 DIAGNOSIS — Z7901 Long term (current) use of anticoagulants: Secondary | ICD-10-CM

## 2018-09-27 NOTE — Telephone Encounter (Signed)
lpmtcb about appointment on 5/28 with Lyda Jester, PA

## 2018-09-27 NOTE — Progress Notes (Signed)
Virtual Visit via Telephone Note   This visit type was conducted due to national recommendations for restrictions regarding the COVID-19 Pandemic (e.g. social distancing) in an effort to limit this patient's exposure and mitigate transmission in our community.  Due to his co-morbid illnesses, this patient is at least at moderate risk for complications without adequate follow up.  This format is felt to be most appropriate for this patient at this time.  The patient did not have access to video technology/had technical difficulties with video requiring transitioning to audio format only (telephone).  All issues noted in this document were discussed and addressed.  No physical exam could be performed with this format.  Please refer to the patient's chart for his  consent to telehealth for [facility].   Date:  09/27/2018   ID:  Juan Li, Juan Li [birth year], MRN [id number]  Patient Location: Home Provider Location: Home  PCP:  Zenia Resides, MD  Cardiologist:  Nelva Bush, MD  Electrophysiologist:  None   Evaluation Performed:  Follow-Up Visit  Chief Complaint:  6 Month F/u for Aortic Valve Disease and Carotid Artery Stenosis   History of Present Illness:    Juan Li is a 85 y.o. male with aortic stenosis status post mechanical aortic valve replacement (1993),heart failure with preserved ejection fraction, carotid artery stenosis, peripheral artery disease, chronic kidney disease stage III, and dementia. He has been followed by Dr. Saunders Revel. He is on coumadin for mechanical aortic vale. INRs are followed in our coumadin clinic. He recently had neurosurgery on his spine and was given clearance to undergo surgery and to come off coumadin with Lovenox bridge. His surgery was roughly 2 weeks ago and he is recovering well. No cardiac complications. He remains on Lovenox and has f/u coumadin clinic visit on Monday. He denies cardiac symptoms. No CP or dyspnea. Denies LEE, orthopnea and  PND. No abnormal bleeding w/ coumadin. He has significant carotid artery disease but asymptomatic w/o syncope/ near syncope or TIA symptoms. His last carotid dopplers were in February and showed  60-79% Left ICA stenosis. It was recommended that he get a f/u scan in 6 months. Last echo was in 2017 and showed normal LVEF and stable mechanical aortic valve. He had a nuclear stress test in 2018 as part of preop w/u prior to cervical spine surgery that was low risk and negative for ischemia. Pt w/o blood pressure cuff at home. No BP reading today.   The patient does not have symptoms concerning for COVID-19 infection (fever, chills, cough, or new shortness of breath).    Past Medical History:  Diagnosis Date  . AKI (acute kidney injury) ([place]) 07/29/2017  . Arthritis 02-15-12   osteoarthritis,right knee, pain right wrist.,  . Arthritis    "hands, legs" (12/01/2016)  . Carotid artery disease ([place])    Carotid US 1/18: R 40-59; L 80-99  . Chronic lower back pain   . Complication of anesthesia 02-15-12   very confused, and very slow to awaken after anesthesia  . Coronary artery disease 02-15-12   Heart valve replaced   . Dementia ([place])   . Depression   . Hypercholesterolemia 02-15-12  . Hypertension   . Memory loss   . PAD (peripheral artery disease) ([place])   . S/P AVR (aortic valve replacement)    Mechanical [employer] AVR // Echo 12/17: Moderate LVH, EF 60-65, normal wall motion, grade 1 diastolic dysfunction, mechanical AVR okay with mean gradient 8 mmHg, MAC, mild mitral  stenosis (mean gradient 2 mmHg), mild MR, mild RAE, PASP 25  . Urinary frequency    Past Surgical History:  Procedure Laterality Date  . ABDOMINAL AORTAGRAM  12/01/2016  . ABDOMINAL AORTOGRAM N/A 12/01/2016   Procedure: ABDOMINAL AORTOGRAM;  Surgeon: Nelva Bush, MD;  Location: [facility];  Service: Cardiovascular;  Laterality: N/A;  . ANTERIOR CERVICAL DECOMP/DISCECTOMY FUSION       Decompression at the level of  C3-C4 and C4-C5, allograft and plate from C3 to C%, microscope/notes 09/14/2010  . ANTERIOR CERVICAL DECOMP/DISCECTOMY FUSION N/A 07/11/2017   Procedure: CERVICAL FIVE-SIX ANTERIOR CERVICAL DECOMPRESSION/DISCECTOMY FUSION AND PLATE FIXATION;  Surgeon: Consuella Lose, MD;  Location: [facility];  Service: Neurosurgery;  Laterality: N/A;  CERVICAL FIVE-SIX ANTERIOR CERVICAL DECOMPRESSION/DISCECTOMY FUSION AND PLATE FIXATION  . BACK SURGERY    . CARDIAC VALVE REPLACEMENT    . CARPAL TUNNEL RELEASE Left 01/2003   Archie Endo 09/14/2010  . JOINT REPLACEMENT    . LOWER EXTREMITY ANGIOGRAPHY  12/01/2016  . LOWER EXTREMITY ANGIOGRAPHY Bilateral 12/01/2016   Procedure: Lower Extremity Angiography;  Surgeon: Nelva Bush, MD;  Location: [facility];  Service: Cardiovascular;  Laterality: Bilateral;  . MECHANICAL AORTIC VALVE REPLACEMENT  1993   Archie Endo 11/18/2016  . SHOULDER OPEN ROTATOR CUFF REPAIR Left 02/15/2012  . TOTAL KNEE ARTHROPLASTY  02/20/2012   Procedure: TOTAL KNEE ARTHROPLASTY;  Surgeon: Gearlean Alf, MD;  Location: [facility];  Service: Orthopedics;  Laterality: Right;  . TRANSURETHRAL RESECTION OF PROSTATE  02/01/2010   Archie Endo 02/06/2010     No outpatient medications have been marked as taking for the 09/27/18 encounter (Telemedicine) with Consuelo Pandy, PA-C.     Allergies:   Patient has no known allergies.   Social History   Tobacco Use  . Smoking status: Former Smoker    Packs/day: 1.00    Years: 50.00    Pack years: 50.00    Types: Cigarettes    Last attempt to quit: 08/10/1995    Years since quitting: 23.1  . Smokeless tobacco: Never Used  Substance Use Topics  . Alcohol use: No    Alcohol/week: 0.0 standard drinks    Frequency: Never    Comment: 12/01/2016 "nothing in the 2000s"  . Drug use: No     Family Hx: The patient's family history includes Heart attack in his father; Heart disease in his mother; Hyperlipidemia in his father and mother; Hypertension in his  father and mother; Varicose Veins in his mother.  ROS:   Please see the history of present illness.     All other systems reviewed and are negative.   Prior CV studies:   The following studies were reviewed today:  Carotid Dopplers 06/2018 Summary: Right Carotid: Velocities in the right ICA are consistent with a 1-39% stenosis.                Hemodynamically significant plaque >50% visualized in the CCA.                Unable to obtain elevated velocities as seen on prior exam.  Left Carotid: Velocities in the left ICA are consistent with a 60-79% stenosis.               Non-hemodynamically significant plaque noted in the CCA. The ECA               appears >50% stenosed. Significant increase in diastolic velocity  compared to prior. Recommend vascular consult for possible carotid               revascularization.               Velocities are elevated and stable compared to prior exam.  2D Echo 04/2016  Impressions:  - LVEF 60-65%, moderate LVH, normal wall motion, s/p mechanical   bileaflet AVR without obstruction, MAC with mild mitral stenosis   and regurgitation, normal LA, trivial TR, RVSP 25 mmHg, normal   IVC.  NST 03/2017 Study Highlights     Nuclear stress EF: 56%.  There was no ST segment deviation noted during stress.  This is a low risk study.   Normal perfusion. LVEF 56% with normal wall motion. This is a low risk study.      Labs/Other Tests and Data Reviewed:    EKG:  An ECG dated 07/28/2017 was personally reviewed today and demonstrated:  Sinus rhythm with incomplete left bundle  Recent Labs: 03/21/2018: BUN 23; Creatinine, Ser 1.52; Hemoglobin 13.2; Platelets 239; Potassium 4.0; Sodium 139   Recent Lipid Panel Lab Results  Component Value Date/Time   CHOL 191 01/03/2018 02:21 PM   TRIG 167 (H) 01/03/2018 02:21 PM   HDL 44 01/03/2018 02:21 PM   CHOLHDL 4.3 01/03/2018 02:21 PM   CHOLHDL 4.7 06/10/2015 03:01 PM   LDLCALC 114  (H) 01/03/2018 02:21 PM   LDLDIRECT 134.0 02/03/2014 04:44 PM    Wt Readings from Last 3 Encounters:  09/27/18 175 lb (79.4 kg)  03/21/18 171 lb 12.8 oz (77.9 kg)  01/03/18 169 lb 12.8 oz (77 kg)     Objective:    Vital Signs:  Ht _0  (1.676 m)   Wt 175 lb (79.4 kg)   BMI 28.25 kg/m    Physical exam: Well sounding male in no acute distress.  Speaking in clear complete sentences.  Speech unlabored.  Breathing unlabored.  ASSESSMENT & PLAN:    1. Mechanical aortic valve: On chronic Coumadin.  Reports full compliance.  Recently held given neurosurgery and continues with Lovenox bridge.  This is being managed by our Coumadin clinic.  He has to follow-up in Coumadin clinic on Monday.  Denies any abnormal bleeding.  No falls.  He also denies any chest pain or dyspnea.  Echocardiogram in 2017 showed normal EF and stable mechanical valve.  SBE prophylaxis for dental procedures.  2.  Carotid artery disease: Recent carotid artery Dopplers in February showed 60 to 79% left ICA stenosis.  He is completely asymptomatic.  No syncope/near syncope.  No strokelike symptoms.  Plan is for repeat Dopplers in 6 months time which will be in August of this year.  3.  Heart failure with preserved ejection fraction: Last echocardiogram in 2018 showed normal EF.  He denies dyspnea.  No lower extremity edema, orthopnea or PND.  COVID-19 Education: The signs and symptoms of COVID-19 were discussed with the patient and how to seek care for testing (follow up with PCP or arrange E-visit).  The importance of social distancing was discussed today.  Time:   Today, I have spent 15 minutes with the patient with telehealth technology discussing the above problems.     Medication Adjustments/Labs and Tests Ordered: Current medicines are reviewed at length with the patient today.  Concerns regarding medicines are outlined above.   Tests Ordered: No orders of the defined types were placed in this encounter.    Medication Changes: No orders of the defined types were  placed in this encounter.   Disposition:  Follow up in 6 months.  Patient was previously being followed by Dr. Saunders Revel and will need to transfer care to a new primary cardiologist.  Patient resides in [place] and wishes to keep care here.  Signed, Lyda Jester, PA-C  09/27/2018 1:58 PM    [facility]

## 2018-09-27 NOTE — Telephone Encounter (Signed)

## 2018-09-27 NOTE — Patient Instructions (Addendum)
Medication Instructions:  none If you need a refill on your cardiac medications before your next appointment, please call your pharmacy.   Lab work: none If you have labs (blood work) drawn today and your tests are completely normal, you will receive your results only by: Marland Kitchen MyChart Message (if you have MyChart) OR . A paper copy in the mail If you have any lab test that is abnormal or we need to change your treatment, we will call you to review the results.  Testing/Procedures: In August At [facility] December 17, 2018 @ 2:00 pm   Your physician has requested that you have a carotid duplex. This test is an ultrasound of the carotid arteries in your neck. It looks at blood flow through these arteries that supply the brain with blood. Allow one hour for this exam. There are no restrictions or special instructions.    Follow-Up: 6 Months With Cardiologist In [facility] (was seeing Dr End) At [facility], you and your health needs are our priority.  As part of our continuing mission to provide you with exceptional heart care, we have created designated Provider Care Teams.  These Care Teams include your primary Cardiologist (physician) and Advanced Practice Providers (APPs -  Physician Assistants and Nurse Practitioners) who all work together to provide you with the care you need, when you need it. .   Any Other Special Instructions Will Be Listed Below (If Applicable).  Please check with your Primary Care Physician on arranging [place]

## 2018-09-28 ENCOUNTER — Telehealth: Payer: Self-pay

## 2018-09-28 DIAGNOSIS — Z952 Presence of prosthetic heart valve: Secondary | ICD-10-CM

## 2018-09-28 DIAGNOSIS — I359 Nonrheumatic aortic valve disorder, unspecified: Secondary | ICD-10-CM

## 2018-09-28 NOTE — Telephone Encounter (Signed)
-----   Message from Josue Hector, MD sent at 09/28/2018 12:41 PM EDT ----- Regarding: RE: con't care with Dr. Johnsie Cancel That's fine he just saw PA can see me in 6 months with echo before / same day for AVR ----- Message ----- From: Michaelyn Barter, RN Sent: 09/28/2018  12:34 PM EDT To: Josue Hector, MD Subject: FW: con't care with Dr. Johnsie Cancel                 Do you mind seeing this patient, as a new patient?  Pam ----- Message ----- From: Holley Dexter Sent: 09/28/2018  11:57 AM EDT To: Michaelyn Barter, RN, Frederik Schmidt, RN Subject: con't care with Dr. Johnsie Cancel                     Patient was seeing Dr. Saunders Revel, but now would like to establish care in [facility].  Pt is due 6 month f/u with a new cardiologist, and prefer Dr. Johnsie Cancel. Please advise appt. slot for patient. Thanks renee

## 2018-09-28 NOTE — Telephone Encounter (Signed)
Will place order for echo and put patient on recall list for 6 month f/u with Dr. Johnsie Cancel.

## 2018-10-01 ENCOUNTER — Ambulatory Visit (INDEPENDENT_AMBULATORY_CARE_PROVIDER_SITE_OTHER): Payer: Medicare Other | Admitting: *Deleted

## 2018-10-01 ENCOUNTER — Other Ambulatory Visit: Payer: Self-pay

## 2018-10-01 DIAGNOSIS — Z5181 Encounter for therapeutic drug level monitoring: Secondary | ICD-10-CM | POA: Diagnosis not present

## 2018-10-01 DIAGNOSIS — Z7901 Long term (current) use of anticoagulants: Secondary | ICD-10-CM | POA: Diagnosis not present

## 2018-10-01 DIAGNOSIS — Z952 Presence of prosthetic heart valve: Secondary | ICD-10-CM | POA: Diagnosis not present

## 2018-10-01 DIAGNOSIS — I359 Nonrheumatic aortic valve disorder, unspecified: Secondary | ICD-10-CM | POA: Diagnosis not present

## 2018-10-01 LAB — POCT INR: INR: 1.8 — AB (ref 2.0–3.0)

## 2018-10-01 MED ORDER — ENOXAPARIN SODIUM 80 MG/0.8ML ~~LOC~~ SOLN: 80.0000 mg | Freq: Two times a day (BID) | SUBCUTANEOUS | 1 refills | Status: DC

## 2018-10-01 NOTE — Patient Instructions (Addendum)
Description   Continue Lovenox twice daily and take 10mg  today and tomorrow then resume Normal dose: one tablet daily.  Recheck INR on 6/5. Call us with any medication changes or concerns # [phone] Coumadin Clinic.

## 2018-10-02 ENCOUNTER — Telehealth: Payer: Self-pay

## 2018-10-02 NOTE — Telephone Encounter (Signed)

## 2018-10-05 ENCOUNTER — Ambulatory Visit (INDEPENDENT_AMBULATORY_CARE_PROVIDER_SITE_OTHER): Payer: Medicare Other | Admitting: Pharmacist

## 2018-10-05 ENCOUNTER — Other Ambulatory Visit: Payer: Self-pay

## 2018-10-05 DIAGNOSIS — Z5181 Encounter for therapeutic drug level monitoring: Secondary | ICD-10-CM | POA: Diagnosis not present

## 2018-10-05 DIAGNOSIS — I359 Nonrheumatic aortic valve disorder, unspecified: Secondary | ICD-10-CM

## 2018-10-05 DIAGNOSIS — Z952 Presence of prosthetic heart valve: Secondary | ICD-10-CM

## 2018-10-05 DIAGNOSIS — Z7901 Long term (current) use of anticoagulants: Secondary | ICD-10-CM | POA: Diagnosis not present

## 2018-10-05 LAB — POCT INR: INR: 3.5 — AB (ref 2.0–3.0)

## 2018-10-05 NOTE — Patient Instructions (Signed)
Description   Stop taking Lovenox. Continue taking Coumadin 1 tablet daily.  Recheck INR 3 weeks. Call us with any medication changes or concerns # [phone] Coumadin Clinic.

## 2018-10-11 ENCOUNTER — Telehealth (HOSPITAL_COMMUNITY): Payer: Self-pay | Admitting: *Deleted

## 2018-10-11 NOTE — Telephone Encounter (Signed)

## 2018-10-12 ENCOUNTER — Other Ambulatory Visit: Payer: Self-pay

## 2018-10-12 ENCOUNTER — Ambulatory Visit (HOSPITAL_COMMUNITY): Payer: Medicare Other | Attending: Cardiology

## 2018-10-12 DIAGNOSIS — I359 Nonrheumatic aortic valve disorder, unspecified: Secondary | ICD-10-CM | POA: Diagnosis not present

## 2018-10-12 DIAGNOSIS — Z952 Presence of prosthetic heart valve: Secondary | ICD-10-CM | POA: Insufficient documentation

## 2018-10-22 ENCOUNTER — Telehealth: Payer: Self-pay

## 2018-10-22 NOTE — Telephone Encounter (Signed)

## 2018-10-22 NOTE — Telephone Encounter (Signed)
lmom for prescreen  

## 2018-10-25 ENCOUNTER — Other Ambulatory Visit: Payer: Self-pay | Admitting: Family Medicine

## 2018-10-25 DIAGNOSIS — I739 Peripheral vascular disease, unspecified: Secondary | ICD-10-CM

## 2018-10-26 ENCOUNTER — Other Ambulatory Visit: Payer: Self-pay

## 2018-10-26 ENCOUNTER — Ambulatory Visit (INDEPENDENT_AMBULATORY_CARE_PROVIDER_SITE_OTHER): Payer: Medicare Other | Admitting: *Deleted

## 2018-10-26 DIAGNOSIS — Z5181 Encounter for therapeutic drug level monitoring: Secondary | ICD-10-CM

## 2018-10-26 DIAGNOSIS — Z952 Presence of prosthetic heart valve: Secondary | ICD-10-CM

## 2018-10-26 DIAGNOSIS — I359 Nonrheumatic aortic valve disorder, unspecified: Secondary | ICD-10-CM | POA: Diagnosis not present

## 2018-10-26 DIAGNOSIS — Z7901 Long term (current) use of anticoagulants: Secondary | ICD-10-CM | POA: Diagnosis not present

## 2018-10-26 LAB — POCT INR: INR: 2.2 (ref 2.0–3.0)

## 2018-10-26 NOTE — Patient Instructions (Signed)
Description   Today take 1.5 tablets then continue taking Coumadin 1 tablet daily.  Recheck INR 3 weeks. Call us with any medication changes or concerns # [phone] Coumadin Clinic.

## 2018-10-28 ENCOUNTER — Other Ambulatory Visit: Payer: Self-pay | Admitting: Family Medicine

## 2018-11-05 DIAGNOSIS — M5136 Other intervertebral disc degeneration, lumbar region: Secondary | ICD-10-CM | POA: Diagnosis not present

## 2018-11-05 DIAGNOSIS — M9903 Segmental and somatic dysfunction of lumbar region: Secondary | ICD-10-CM | POA: Diagnosis not present

## 2018-11-05 DIAGNOSIS — M9905 Segmental and somatic dysfunction of pelvic region: Secondary | ICD-10-CM | POA: Diagnosis not present

## 2018-11-05 DIAGNOSIS — M9904 Segmental and somatic dysfunction of sacral region: Secondary | ICD-10-CM | POA: Diagnosis not present

## 2018-11-06 DIAGNOSIS — M9905 Segmental and somatic dysfunction of pelvic region: Secondary | ICD-10-CM | POA: Diagnosis not present

## 2018-11-06 DIAGNOSIS — M9904 Segmental and somatic dysfunction of sacral region: Secondary | ICD-10-CM | POA: Diagnosis not present

## 2018-11-06 DIAGNOSIS — M9903 Segmental and somatic dysfunction of lumbar region: Secondary | ICD-10-CM | POA: Diagnosis not present

## 2018-11-06 DIAGNOSIS — M5136 Other intervertebral disc degeneration, lumbar region: Secondary | ICD-10-CM | POA: Diagnosis not present

## 2018-11-07 ENCOUNTER — Telehealth: Payer: Self-pay

## 2018-11-07 NOTE — Telephone Encounter (Signed)

## 2018-11-08 DIAGNOSIS — M9904 Segmental and somatic dysfunction of sacral region: Secondary | ICD-10-CM | POA: Diagnosis not present

## 2018-11-08 DIAGNOSIS — M9903 Segmental and somatic dysfunction of lumbar region: Secondary | ICD-10-CM | POA: Diagnosis not present

## 2018-11-08 DIAGNOSIS — M9905 Segmental and somatic dysfunction of pelvic region: Secondary | ICD-10-CM | POA: Diagnosis not present

## 2018-11-08 DIAGNOSIS — M5136 Other intervertebral disc degeneration, lumbar region: Secondary | ICD-10-CM | POA: Diagnosis not present

## 2018-11-12 DIAGNOSIS — M9905 Segmental and somatic dysfunction of pelvic region: Secondary | ICD-10-CM | POA: Diagnosis not present

## 2018-11-12 DIAGNOSIS — M9903 Segmental and somatic dysfunction of lumbar region: Secondary | ICD-10-CM | POA: Diagnosis not present

## 2018-11-12 DIAGNOSIS — M5136 Other intervertebral disc degeneration, lumbar region: Secondary | ICD-10-CM | POA: Diagnosis not present

## 2018-11-12 DIAGNOSIS — M9904 Segmental and somatic dysfunction of sacral region: Secondary | ICD-10-CM | POA: Diagnosis not present

## 2018-11-14 ENCOUNTER — Ambulatory Visit (INDEPENDENT_AMBULATORY_CARE_PROVIDER_SITE_OTHER): Payer: Medicare Other | Admitting: Pharmacist

## 2018-11-14 ENCOUNTER — Other Ambulatory Visit: Payer: Self-pay

## 2018-11-14 DIAGNOSIS — Z952 Presence of prosthetic heart valve: Secondary | ICD-10-CM

## 2018-11-14 DIAGNOSIS — Z7901 Long term (current) use of anticoagulants: Secondary | ICD-10-CM | POA: Diagnosis not present

## 2018-11-14 DIAGNOSIS — Z5181 Encounter for therapeutic drug level monitoring: Secondary | ICD-10-CM | POA: Diagnosis not present

## 2018-11-14 DIAGNOSIS — I359 Nonrheumatic aortic valve disorder, unspecified: Secondary | ICD-10-CM

## 2018-11-14 LAB — POCT INR: INR: 2.2 (ref 2.0–3.0)

## 2018-11-14 NOTE — Patient Instructions (Addendum)
Today take 1.5 tablets today, then start taking Coumadin 1 tablet daily except 1.5mg  on Wedensdays.  Recheck INR 3 weeks. Call us with any medication changes or concerns # [phone] Coumadin Clinic.

## 2018-11-29 ENCOUNTER — Other Ambulatory Visit: Payer: Self-pay | Admitting: Family Medicine

## 2018-11-29 DIAGNOSIS — N401 Enlarged prostate with lower urinary tract symptoms: Secondary | ICD-10-CM

## 2018-12-05 ENCOUNTER — Other Ambulatory Visit: Payer: Self-pay

## 2018-12-05 ENCOUNTER — Ambulatory Visit (INDEPENDENT_AMBULATORY_CARE_PROVIDER_SITE_OTHER): Payer: Medicare Other | Admitting: *Deleted

## 2018-12-05 DIAGNOSIS — Z5181 Encounter for therapeutic drug level monitoring: Secondary | ICD-10-CM | POA: Diagnosis not present

## 2018-12-05 DIAGNOSIS — I359 Nonrheumatic aortic valve disorder, unspecified: Secondary | ICD-10-CM

## 2018-12-05 DIAGNOSIS — Z7901 Long term (current) use of anticoagulants: Secondary | ICD-10-CM

## 2018-12-05 DIAGNOSIS — Z952 Presence of prosthetic heart valve: Secondary | ICD-10-CM | POA: Diagnosis not present

## 2018-12-05 LAB — POCT INR: INR: 2.5 (ref 2.0–3.0)

## 2018-12-05 NOTE — Patient Instructions (Addendum)
Description   Continue taking Coumadin 1 tablet daily except 1.5 tablets on Wedensdays.  Recheck INR 4 weeks. Call us with any medication changes or concerns # [phone] Coumadin Clinic.

## 2018-12-11 ENCOUNTER — Other Ambulatory Visit: Payer: Self-pay

## 2018-12-11 MED ORDER — AMLODIPINE BESYLATE 5 MG PO TABS: 5.0000 mg | ORAL_TABLET | Freq: Every day | ORAL | 3 refills | Status: DC

## 2018-12-12 ENCOUNTER — Telehealth: Payer: Self-pay | Admitting: Cardiology

## 2018-12-12 NOTE — Telephone Encounter (Signed)
New Message     Patient wants to know why he has to have carotid doppler done and would like a nurse to call him.

## 2018-12-12 NOTE — Telephone Encounter (Signed)
I spoke with the patient's wife and explained why a Carotid US is scheduled for August.

## 2018-12-17 ENCOUNTER — Ambulatory Visit (HOSPITAL_COMMUNITY)
Admission: RE | Admit: 2018-12-17 | Payer: Medicare Other | Source: Ambulatory Visit | Attending: Cardiovascular Disease | Admitting: Cardiovascular Disease

## 2018-12-21 ENCOUNTER — Telehealth: Payer: Self-pay

## 2018-12-21 ENCOUNTER — Other Ambulatory Visit (HOSPITAL_COMMUNITY): Payer: Self-pay | Admitting: Cardiovascular Disease

## 2018-12-21 ENCOUNTER — Ambulatory Visit (INDEPENDENT_AMBULATORY_CARE_PROVIDER_SITE_OTHER): Payer: Medicare Other | Admitting: *Deleted

## 2018-12-21 ENCOUNTER — Ambulatory Visit (HOSPITAL_COMMUNITY)
Admission: RE | Admit: 2018-12-21 | Discharge: 2018-12-21 | Disposition: A | Discharge status: Home / Self Care | Payer: Medicare Other | Source: Ambulatory Visit | Attending: Cardiology | Admitting: Cardiology

## 2018-12-21 ENCOUNTER — Other Ambulatory Visit: Payer: Self-pay

## 2018-12-21 DIAGNOSIS — Z952 Presence of prosthetic heart valve: Secondary | ICD-10-CM | POA: Diagnosis not present

## 2018-12-21 DIAGNOSIS — I6523 Occlusion and stenosis of bilateral carotid arteries: Secondary | ICD-10-CM

## 2018-12-21 DIAGNOSIS — Z7901 Long term (current) use of anticoagulants: Secondary | ICD-10-CM | POA: Diagnosis not present

## 2018-12-21 DIAGNOSIS — I6522 Occlusion and stenosis of left carotid artery: Secondary | ICD-10-CM

## 2018-12-21 DIAGNOSIS — I359 Nonrheumatic aortic valve disorder, unspecified: Secondary | ICD-10-CM | POA: Diagnosis not present

## 2018-12-21 DIAGNOSIS — Z5181 Encounter for therapeutic drug level monitoring: Secondary | ICD-10-CM

## 2018-12-21 LAB — POCT INR: INR: 2.1 (ref 2.0–3.0)

## 2018-12-21 NOTE — Telephone Encounter (Signed)
LMTCB

## 2018-12-21 NOTE — Patient Instructions (Signed)
Description   Today take 1.5 tablets then continue taking Coumadin 1 tablet daily except 1.5 tablets on Wednesdays.  Recheck INR 3 weeks. Call us with any medication changes or concerns # [phone] Coumadin Clinic.

## 2018-12-21 NOTE — Telephone Encounter (Signed)
-----   Message from Charlie Pitter, [name] sent at 12/21/2018  4:20 PM EDT ----- Covering for Lyda Jester, routing to triage per instructions since Anderson Malta is out. Dr. Johnsie Cancel addended message of results on this patient. Please let pt know study showed 40-59% blockage in both arteries - moderate range so no surgical procedures needed at this time, but Dr. Johnsie Cancel who read study recommends f/u duplex in 1 year (not 6 months as listed at bottom). Per notes, pt previously declined statin titration, can be readdressed in follow-up. If he would like to revisit his cholesterol treatment, would advise CMET/lipid profile. He can also discuss with his cardiologist in f/u. I saw in 08/2018 Brittainy recommended f/u in 6 months (so ~Nov 2020), please make sure this is in pending. Dayna Dunn PA-C

## 2018-12-24 ENCOUNTER — Other Ambulatory Visit: Payer: Self-pay

## 2018-12-24 DIAGNOSIS — I6523 Occlusion and stenosis of bilateral carotid arteries: Secondary | ICD-10-CM

## 2018-12-24 NOTE — Telephone Encounter (Signed)
Notes recorded by Frederik Schmidt, RN on 12/24/2018 at 2:21 PM EDT  The patient has been notified of the result and verbalized understanding. All questions (if any) were answered.  Frederik Schmidt, RN 12/24/2018 2:21 PM   The patient would like to wait until November f/u to discuss cholesterol medication advisement.

## 2018-12-24 NOTE — Telephone Encounter (Signed)
-----   Message from Charlie Pitter, [name] sent at 12/21/2018  4:20 PM EDT ----- Covering for Lyda Jester, routing to triage per instructions since Anderson Malta is out. Dr. Johnsie Cancel addended message of results on this patient. Please let pt know study showed 40-59% blockage in both arteries - moderate range so no surgical procedures needed at this time, but Dr. Johnsie Cancel who read study recommends f/u duplex in 1 year (not 6 months as listed at bottom). Per notes, pt previously declined statin titration, can be readdressed in follow-up. If he would like to revisit his cholesterol treatment, would advise CMET/lipid profile. He can also discuss with his cardiologist in f/u. I saw in 08/2018 Brittainy recommended f/u in 6 months (so ~Nov 2020), please make sure this is in pending. Dayna Dunn PA-C

## 2019-01-11 ENCOUNTER — Other Ambulatory Visit: Payer: Self-pay

## 2019-01-11 ENCOUNTER — Ambulatory Visit (INDEPENDENT_AMBULATORY_CARE_PROVIDER_SITE_OTHER): Payer: Medicare Other | Admitting: *Deleted

## 2019-01-11 DIAGNOSIS — Z952 Presence of prosthetic heart valve: Secondary | ICD-10-CM

## 2019-01-11 DIAGNOSIS — I359 Nonrheumatic aortic valve disorder, unspecified: Secondary | ICD-10-CM | POA: Diagnosis not present

## 2019-01-11 DIAGNOSIS — Z7901 Long term (current) use of anticoagulants: Secondary | ICD-10-CM | POA: Diagnosis not present

## 2019-01-11 DIAGNOSIS — Z5181 Encounter for therapeutic drug level monitoring: Secondary | ICD-10-CM

## 2019-01-11 LAB — POCT INR: INR: 1.7 — AB (ref 2.0–3.0)

## 2019-01-11 NOTE — Patient Instructions (Signed)
Description   Take 1.5 tablets today and tomorrow,  then start taking Coumadin 1 tablet daily except 1.5 tablets on Wednesdays and fridays.  Recheck INR 2 weeks. Call us with any medication changes or concerns # [phone] Coumadin Clinic.

## 2019-01-16 ENCOUNTER — Encounter: Payer: Self-pay | Admitting: Family Medicine

## 2019-01-16 ENCOUNTER — Other Ambulatory Visit: Payer: Self-pay

## 2019-01-16 ENCOUNTER — Ambulatory Visit (INDEPENDENT_AMBULATORY_CARE_PROVIDER_SITE_OTHER): Payer: Medicare Other | Admitting: Family Medicine

## 2019-01-16 DIAGNOSIS — Z23 Encounter for immunization: Secondary | ICD-10-CM

## 2019-01-16 DIAGNOSIS — N401 Enlarged prostate with lower urinary tract symptoms: Secondary | ICD-10-CM | POA: Diagnosis not present

## 2019-01-16 DIAGNOSIS — F0390 Unspecified dementia without behavioral disturbance: Secondary | ICD-10-CM

## 2019-01-16 DIAGNOSIS — I6523 Occlusion and stenosis of bilateral carotid arteries: Secondary | ICD-10-CM

## 2019-01-16 DIAGNOSIS — I5032 Chronic diastolic (congestive) heart failure: Secondary | ICD-10-CM | POA: Diagnosis not present

## 2019-01-16 DIAGNOSIS — N183 Chronic kidney disease, stage 3 unspecified: Secondary | ICD-10-CM

## 2019-01-16 DIAGNOSIS — R197 Diarrhea, unspecified: Secondary | ICD-10-CM

## 2019-01-16 MED ORDER — OXYBUTYNIN CHLORIDE 5 MG PO TABS: 5.0000 mg | ORAL_TABLET | Freq: Every day | ORAL | Status: DC

## 2019-01-16 NOTE — Patient Instructions (Addendum)
Double check you med list to make sure we agree. Stay on the oxybutinin Stop the aricept/donnepezol.  That may be making the poop worse.   I am getting blood work.  I will call tomorrow with results.

## 2019-01-17 LAB — CMP14+EGFR
ALT: 8 IU/L (ref 0–44)
AST: 14 IU/L (ref 0–40)
Albumin/Globulin Ratio: 1.8 (ref 1.2–2.2)
Albumin: 4.5 g/dL (ref 3.6–4.6)
Alkaline Phosphatase: 68 IU/L (ref 39–117)
BUN/Creatinine Ratio: 15 (ref 10–24)
BUN: 24 mg/dL (ref 8–27)
Bilirubin Total: 0.9 mg/dL (ref 0.0–1.2)
CO2: 17 mmol/L — ABNORMAL LOW (ref 20–29)
Calcium: 9 mg/dL (ref 8.6–10.2)
Chloride: 107 mmol/L — ABNORMAL HIGH (ref 96–106)
Creatinine, Ser: 1.65 mg/dL — ABNORMAL HIGH (ref 0.76–1.27)
GFR calc Af Amer: 43 mL/min/{1.73_m2} — ABNORMAL LOW (ref >59)
GFR calc non Af Amer: 37 mL/min/{1.73_m2} — ABNORMAL LOW (ref >59)
Globulin, Total: 2.5 g/dL (ref 1.5–4.5)
Glucose: 93 mg/dL (ref 65–99)
Potassium: 4.4 mmol/L (ref 3.5–5.2)
Sodium: 138 mmol/L (ref 134–144)
Total Protein: 7 g/dL (ref 6.0–8.5)

## 2019-01-17 LAB — CBC
Hematocrit: 42.3 % (ref 37.5–51.0)
Hemoglobin: 14.1 g/dL (ref 13.0–17.7)
MCH: 31.2 pg (ref 26.6–33.0)
MCHC: 33.3 g/dL (ref 31.5–35.7)
MCV: 94 fL (ref 79–97)
Platelets: 241 10*3/uL (ref 150–450)
RBC: 4.52 x10E6/uL (ref 4.14–5.80)
RDW: 14.2 % (ref 11.6–15.4)
WBC: 10.5 10*3/uL (ref 3.4–10.8)

## 2019-01-18 ENCOUNTER — Encounter: Payer: Self-pay | Admitting: Family Medicine

## 2019-01-18 MED ORDER — MIRABEGRON ER 25 MG PO TB24: 25.0000 mg | ORAL_TABLET | Freq: Every day | ORAL | Status: DC

## 2019-01-18 NOTE — Assessment & Plan Note (Signed)
Trial off aricept because of diarrhea

## 2019-01-18 NOTE — Progress Notes (Signed)
Established Patient Office Visit  Subjective:  Patient ID: Juan Li, male    DOB: [birth year]  Age: 85 y.o. MRN: [id number]  CC:  Chief Complaint  Patient presents with   Numbness    HPI Juan Li presents for FU multiple problems 1. Weak legs from chronic cervical myelopathy.  No recent change.  Uses walker.  Gets around [facility].  No recent falls.   2. Dementia makes it difficult for him to remember the walker regularly.  Seems manageable at home with wife as primary caregiver.   3. Medication confusion.  Wife could not clarify during visit.  We were able to clarify when I called back with labs.   4. Diarrhea - especially bothersome when they go out to eat.   5. Nocturia.  He had been taking both myrbetriq and oxybutinin.  Wife is reluctant to stop since it helps him be dry at night. 6. CKD 3.  No recent labs 7. Diastolic CHF.  Denies CP, DOE, and pedal edema  Past Medical History:  Diagnosis Date   AKI (acute kidney injury) ([place]) 07/29/2017   Arthritis 02-15-12   osteoarthritis,right knee, pain right wrist.,   Arthritis    "hands, legs" (12/01/2016)   Carotid artery disease ([place])    Carotid US 1/18: R 40-59; L 80-99   Chronic lower back pain    Complication of anesthesia 02-15-12   very confused, and very slow to awaken after anesthesia   Coronary artery disease 02-15-12   Heart valve replaced    Dementia ([place])    Depression    Hypercholesterolemia 02-15-12   Hypertension    Memory loss    PAD (peripheral artery disease) ([facility])    S/P AVR (aortic valve replacement)    Mechanical [employer] AVR // Echo 12/17: Moderate LVH, EF 60-65, normal wall motion, grade 1 diastolic dysfunction, mechanical AVR okay with mean gradient 8 mmHg, MAC, mild mitral stenosis (mean gradient 2 mmHg), mild MR, mild RAE, PASP 25   Urinary frequency     Past Surgical History:  Procedure Laterality Date   ABDOMINAL AORTAGRAM  12/01/2016   ABDOMINAL AORTOGRAM N/A 12/01/2016     Procedure: ABDOMINAL AORTOGRAM;  Surgeon: Nelva Bush, MD;  Location: [facility];  Service: Cardiovascular;  Laterality: N/A;   ANTERIOR CERVICAL DECOMP/DISCECTOMY FUSION       Decompression at the level of C3-C4 and C4-C5, allograft and plate from C3 to C%, microscope/notes 09/14/2010   ANTERIOR CERVICAL DECOMP/DISCECTOMY FUSION N/A 07/11/2017   Procedure: CERVICAL FIVE-SIX ANTERIOR CERVICAL DECOMPRESSION/DISCECTOMY FUSION AND PLATE FIXATION;  Surgeon: Consuella Lose, MD;  Location: [facility];  Service: Neurosurgery;  Laterality: N/A;  CERVICAL FIVE-SIX ANTERIOR CERVICAL DECOMPRESSION/DISCECTOMY FUSION AND PLATE FIXATION   BACK SURGERY     CARDIAC VALVE REPLACEMENT     CARPAL TUNNEL RELEASE Left 01/2003   Archie Endo 09/14/2010   JOINT REPLACEMENT     LOWER EXTREMITY ANGIOGRAPHY  12/01/2016   LOWER EXTREMITY ANGIOGRAPHY Bilateral 12/01/2016   Procedure: Lower Extremity Angiography;  Surgeon: Nelva Bush, MD;  Location: [facility];  Service: Cardiovascular;  Laterality: Bilateral;   MECHANICAL AORTIC VALVE REPLACEMENT  1993   /notes 11/18/2016   SHOULDER OPEN ROTATOR CUFF REPAIR Left 02/15/2012   TOTAL KNEE ARTHROPLASTY  02/20/2012   Procedure: TOTAL KNEE ARTHROPLASTY;  Surgeon: Gearlean Alf, MD;  Location: [facility];  Service: Orthopedics;  Laterality: Right;   TRANSURETHRAL RESECTION OF PROSTATE  02/01/2010   Archie Endo 02/06/2010  Family History  Problem Relation Age of Onset   Heart disease Mother    Hyperlipidemia Mother    Hypertension Mother    Varicose Veins Mother    Hyperlipidemia Father    Hypertension Father    Heart attack Father     Social History   Socioeconomic History   Marital status: Married    Spouse name: [name]   Number of children: 3   Years of education: Not on file   Highest education level: Not on file  Occupational History   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)    Fish farm manager: RETIRED  Social Transport planner strain: Not on file   Food insecurity    Worry: Not on file    Inability: Not on file   Transportation needs    Medical: Not on file    Non-medical: Not on file  Tobacco Use   Smoking status: Former Smoker    Packs/day: 1.00    Years: 50.00    Pack years: 50.00    Types: Cigarettes    Quit date: 08/10/1995    Years since quitting: 23.4   Smokeless tobacco: Never Used  Substance and Sexual Activity   Alcohol use: No    Alcohol/week: 0.0 standard drinks    Frequency: Never    Comment: 12/01/2016 "nothing in the 2000s"   Drug use: No   Sexual activity: Not on file  Lifestyle   Physical activity    Days per week: Not on file    Minutes per session: Not on file   Stress: Not on file  Relationships   Social connections    Talks on phone: Not on file    Gets together: Not on file    Attends religious service: Not on file    Active member of club or organization: Not on file    Attends meetings of clubs or organizations: Not on file    Relationship status: Not on file   Intimate partner violence    Fear of current or ex partner: Not on file    Emotionally abused: Not on file    Physically abused: Not on file    Forced sexual activity: Not on file  Other Topics Concern   Not on file  Social History Narrative   Health Care POA: Wife, An[place]   Emergency Contact: wife, [place], [phone] (c)   End of Life Plan:   Who lives with you: Patient lives with wife.   Any pets:    Diet: Patient has a varied diet of protein, starch, and vegetables.   Exercise: Patient does not have a regular exercise plan, afraid of falling.   Seatbelts: Patient reports wearing seatbelt when in vehicle.    Sun Exposure/Protection: Patient does not wear sun protection.   Hobbies: Patient likes to do yard work.          Outpatient Medications Prior to Visit  Medication Sig Dispense Refill   amLODipine (NORVASC) 5 MG tablet Take 1 tablet (5 mg  total) by mouth daily. 90 tablet 3   buPROPion (WELLBUTRIN) 75 MG tablet TAKE 1 TABLET BY MOUTH EVERY DAY 90 tablet 3   finasteride (PROSCAR) 5 MG tablet TAKE 1 TABLET (5 MG TOTAL) BY MOUTH DAILY. 90 tablet 3   memantine (NAMENDA) 5 MG tablet TAKE 1 TABLET BY MOUTH TWICE A DAY 180 tablet 3   pravastatin (PRAVACHOL) 40 MG tablet TAKE 1 TABLET BY MOUTH EVERY DAY IN THE EVENING 90 tablet  3   tamsulosin (FLOMAX) 0.4 MG CAPS capsule TAKE ONE CAPSULE BY MOUTH EVERY DAY 90 capsule 3   warfarin (COUMADIN) 5 MG tablet TAKE 1 TABLET AS DIRECTED 90 tablet 4   donepezil (ARICEPT) 5 MG tablet TAKE 1 TABLET BY MOUTH EVERYDAY AT BEDTIME 90 tablet 3   oxybutynin (DITROPAN) 5 MG tablet TAKE 1 TABLET BY MOUTH EVERYDAY AT BEDTIME 90 tablet 3   colchicine 0.6 MG tablet Take 0.6 mg by mouth once a week.     enoxaparin (LOVENOX) 80 MG/0.8ML injection Inject 0.8 mLs (80 mg total) into the skin every 12 (twelve) hours. 10 Syringe 1   HYDROcodone-acetaminophen (NORCO/VICODIN) 5-325 MG tablet Take 1 tablet by mouth every 6 (six) hours as needed for moderate pain. For chronic pain 100 tablet 0   mirabegron ER (MYRBETRIQ) 25 MG TB24 tablet Take 25 mg by mouth daily.     potassium chloride SA (K-DUR,KLOR-CON) 20 MEQ tablet Take 20 mEq by mouth every other day.     Sennosides-Docusate Sodium (SENEXON-S PO) Take 1 tablet by mouth daily as needed (constipation).     Vitamin D, Ergocalciferol, (DRISDOL) 50000 units CAPS capsule Take 50,000 Units by mouth every Wednesday.     No facility-administered medications prior to visit.     No Known Allergies  ROS Review of Systems    Objective:    Physical Exam  BP (!) 130/58    Pulse 62    Wt 176 lb 6.4 oz (80 kg)    SpO2 97%    BMI 28.47 kg/m  Wt Readings from Last 3 Encounters:  01/16/19 176 lb 6.4 oz (80 kg)  09/27/18 175 lb (79.4 kg)  03/21/18 171 lb 12.8 oz (77.9 kg)   Watched gait with walker.  He is weak and the walker gets too far out front.  Able  to turn OK. Lungs clear Cardiac RRR without m or G No peripheral edema  There are no preventive care reminders to display for this patient.  There are no preventive care reminders to display for this patient.  Lab Results  Component Value Date   TSH 5.710 (H) 08/30/2017   Lab Results  Component Value Date   WBC 10.5 01/16/2019   HGB 14.1 01/16/2019   HCT 42.3 01/16/2019   MCV 94 01/16/2019   PLT 241 01/16/2019   Lab Results  Component Value Date   NA 138 01/16/2019   K 4.4 01/16/2019   CO2 17 (L) 01/16/2019   GLUCOSE 93 01/16/2019   BUN 24 01/16/2019   CREATININE 1.65 (H) 01/16/2019   BILITOT 0.9 01/16/2019   ALKPHOS 68 01/16/2019   AST 14 01/16/2019   ALT 8 01/16/2019   PROT 7.0 01/16/2019   ALBUMIN 4.5 01/16/2019   CALCIUM 9.0 01/16/2019   ANIONGAP 8 08/01/2017   GFR 60.67 06/02/2014   Lab Results  Component Value Date   CHOL 191 01/03/2018   Lab Results  Component Value Date   HDL 44 01/03/2018   Lab Results  Component Value Date   LDLCALC 114 (H) 01/03/2018   Lab Results  Component Value Date   TRIG 167 (H) 01/03/2018   Lab Results  Component Value Date   CHOLHDL 4.3 01/03/2018   Lab Results  Component Value Date   HGBA1C 5.6 07/27/2017      Assessment & Plan:   Problem List Items Addressed This Visit    (HFpEF) heart failure with preserved ejection fraction ([place])   Relevant Orders   CBC (  Completed)   CMP14+EGFR (Completed)   BPH (benign prostatic hyperplasia)    Other Visit Diagnoses    Need for immunization against influenza       Relevant Orders   Flu Vaccine QUAD 36+ mos IM (Completed)      Meds ordered this encounter  Medications   DISCONTD: oxybutynin (DITROPAN) 5 MG tablet    Sig: Take 1 tablet (5 mg total) by mouth at bedtime.   mirabegron ER (MYRBETRIQ) 25 MG TB24 tablet    Sig: Take 1 tablet (25 mg total) by mouth daily.    Dispense:  30 tablet    Follow-up: No follow-ups on file.    Zenia Resides, MD

## 2019-01-18 NOTE — Assessment & Plan Note (Signed)
Use only myrbetriq.  DC oxybutinin

## 2019-01-18 NOTE — Assessment & Plan Note (Signed)
Seems euvolemic without diuretic.  Continue BP control

## 2019-01-18 NOTE — Assessment & Plan Note (Signed)
DC aricept.  Watch for worsening dementia.

## 2019-01-18 NOTE — Assessment & Plan Note (Signed)
Recheck labs.  Avoid nephrotoxic agents.

## 2019-01-24 ENCOUNTER — Other Ambulatory Visit: Payer: Self-pay

## 2019-01-24 ENCOUNTER — Ambulatory Visit (INDEPENDENT_AMBULATORY_CARE_PROVIDER_SITE_OTHER): Payer: Medicare Other | Admitting: *Deleted

## 2019-01-24 DIAGNOSIS — I359 Nonrheumatic aortic valve disorder, unspecified: Secondary | ICD-10-CM | POA: Diagnosis not present

## 2019-01-24 DIAGNOSIS — Z952 Presence of prosthetic heart valve: Secondary | ICD-10-CM | POA: Diagnosis not present

## 2019-01-24 DIAGNOSIS — Z7901 Long term (current) use of anticoagulants: Secondary | ICD-10-CM

## 2019-01-24 DIAGNOSIS — Z5181 Encounter for therapeutic drug level monitoring: Secondary | ICD-10-CM | POA: Diagnosis not present

## 2019-01-24 LAB — POCT INR: INR: 1.8 — AB (ref 2.0–3.0)

## 2019-01-24 NOTE — Patient Instructions (Signed)
Description   Take an extra 1/2 tablet today and tomorrow,   then start taking Coumadin 1 tablet daily except 1.5 tablets on Monday, Wednesdays and Fridays.  Recheck INR 2 weeks. Call us with any medication changes or concerns # [phone] [facility].

## 2019-02-07 ENCOUNTER — Ambulatory Visit (INDEPENDENT_AMBULATORY_CARE_PROVIDER_SITE_OTHER): Payer: Medicare Other

## 2019-02-07 ENCOUNTER — Other Ambulatory Visit: Payer: Self-pay

## 2019-02-07 DIAGNOSIS — Z952 Presence of prosthetic heart valve: Secondary | ICD-10-CM | POA: Diagnosis not present

## 2019-02-07 DIAGNOSIS — Z7901 Long term (current) use of anticoagulants: Secondary | ICD-10-CM

## 2019-02-07 DIAGNOSIS — I359 Nonrheumatic aortic valve disorder, unspecified: Secondary | ICD-10-CM

## 2019-02-07 DIAGNOSIS — Z5181 Encounter for therapeutic drug level monitoring: Secondary | ICD-10-CM | POA: Diagnosis not present

## 2019-02-07 LAB — POCT INR: INR: 2.4 (ref 2.0–3.0)

## 2019-02-07 NOTE — Patient Instructions (Signed)
Description   Take 1.5 tablets today, then resume same dosage 1 tablet daily except 1.5 tablets on Mondays, Wednesdays and Fridays.  Recheck INR 3 weeks. Call us with any medication changes or concerns # [phone] [facility].

## 2019-02-16 IMAGING — RF DG C-ARM 61-120 MIN
1 series · 1 of 1 positions shown · non-contrast
Comparison: Prior MRI from 02/16/2017

CLINICAL DATA: Intraoperative views from ACDF at C5-6.

EXAM:
DG C-ARM 61-120 MIN; DG CERVICAL SPINE - 1 VIEW

[Series 1: run · 1 of 1 slices shown]
[im 1/1]
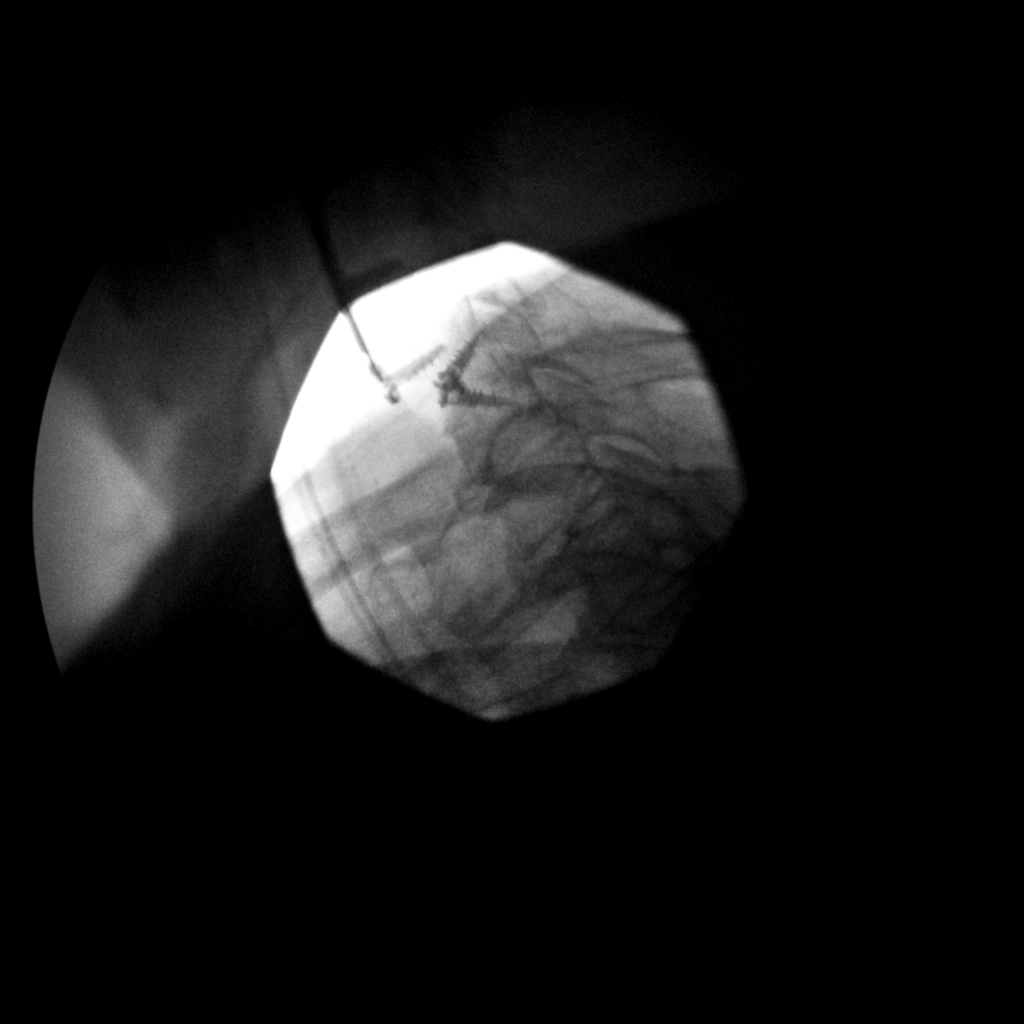

[1 of 1 positions shown; findings below may reference images not displayed]

FINDINGS: Spot lateral intraoperative fluoroscopic view from ACDF at C5-6.
Hardware appears normally position. No complication.
IMPRESSION: Intraoperative fluoroscopic views for ACDF at C5-6.

## 2019-02-27 ENCOUNTER — Telehealth: Payer: Self-pay | Admitting: Cardiovascular Disease

## 2019-02-27 ENCOUNTER — Telehealth: Payer: Self-pay | Admitting: *Deleted

## 2019-02-27 NOTE — Telephone Encounter (Signed)
Wife called and stated they just returned from the [place] and the pt is sleeping a lot. She stated that she thinks the pt is trying to die and that he is nearing the end of life. Advised that if he is having end of life issues that she should call 911 or seek medical assistance at the ER, Urgent Care, or PCP.  She said he does not want to go the any MD office or hospital and she thinks it is time. Advised that she should call for emergency  assistance since she thinks he is dying. Asked if he was still responsive and she stated yes and he states he is tired and wants to go lay down. She asked if she could get an appt with a MD to just listen to his heart, instructed her that would be an appt she would have to schedule with MD or NP or Nurse schedule and she stated okay. Transferred her to the Scheduler and gave her the number as well. Advised to call back if she needs to reschedule his current appt to have INR checked and to call 911 if she feels things are worse and she stated okay and she was then transferred to scheduler.

## 2019-02-27 NOTE — Telephone Encounter (Signed)
New message:  Wife of the patient called. She said the patient woke up this morning, came down for breakfast and was sitting at the table like he was asleep. He only ate half of his breakfast today, which is not normal. He also mentioned that he did not sleep last night, so he went back to sleep.  The patient mentioned this morning that he is ready to die. The patient does have an appt with the coumadin clinic tomorrow, and she wanted to know if the clinic would be able to take his BO and check his vitals before his appt.  The wife may need to come back with him tomorrow if he is not back to normal. If he wakes up tomorrow and feels back to normal then his wife will just stay in the car.  The wife is especially concerned because their daughter passed away from cancer in 2000/08/07. The daughter woke up and said the same thing, and then passed away two days later.   The wife is just concerned about him.

## 2019-02-27 NOTE — Telephone Encounter (Signed)
I spoke to the patient's wife ([name]) and they will come in tomorrow morning @ 8 for OV with Sharee Pimple because the patient is experiencing SOB and weakness and a feeling of "ready to die."  He is not having any CP, but is very fatigued.

## 2019-02-27 NOTE — Progress Notes (Deleted)
Cardiology Office Note   Date:  02/27/2019   ID:  Hilliard, Borges 03-04-1932, MRN [id number]  PCP:  Zenia Resides, MD  Cardiologist: Dr. Johnsie Cancel, MD   No chief complaint on file.   History of Present Illness: Juan Li is a 85 y.o. male aortic stenosis status post mechanical aortic valve replacement (1993),heart failure with preserved ejection fraction, carotid artery stenosis, peripheral artery disease, chronic kidney disease stage III, and dementia.     Mr. Juan Li is on Coumadin for mechanical aortic valve.  INRs are followed by our Coumadin clinic.  08/2018 he underwent neurosurgery on his spine and was given clearance to undergo surgery and come off Coumadin with Lovenox bridge.  He did well in the postoperative setting with no cardiac problems.  He has significant carotid artery disease however has been relatively asymptomatic without syncope or near syncope or TIA symptoms.  His last carotid Dopplers were 06/2018 which showed 60 to 79% left ICA stenosis.  It was recommended he undergo follow-up scanning in 6 months.  Last echocardiogram in 2017 showed normal LVEF and stable mechanical valve.  Underwent a nuclear stress test in 2018 as part of preop work-up for cervical spine surgery that was considered low risk and negative for ischemia.  He was last seen 09/27/2018 for 55-monthfollow-up. He was doing well at that time with no cardiac complaints. He had recently undergone cervical spine surgery and was transitioning back to Coumadin from Lovenox.  Wife called on 02/19/2019 with concern that the patient is "dying ". Apparently, they have just returned from the beach and the patient has been sleeping more than normal with some shortness of breath.  Also with reports of poor sleep and statements of " I am ready to die".  There were no acute symptoms of chest pain, dizziness, orthopnea or syncope.  Wife reported she was especially concerned because their daughter who passed  away from cancer in 2002 was noted to have said similar statements prior to passing.  Our office directed them to the ED for further evaluation. Wife and patient deferred. Opted for an office visit to discuss.  Today    1.  Mechanical aortic valve: -On chronic Coumadin, reports full compliance. -Coumadin managed by our Coumadin clinic -No acute bleeding or falls -Last echocardiogram from 2017 showed normal LVEF and stable mechanical valve -SBE prophylaxis for dental procedures   2.  Carotic artery disease: -Carotid duplex performed 12/21/2018 with 40 to 59% stenosis in the right and left carotids considered moderate range with no surgical intervention needed at this time.  Per Dr. NJohnsie Cancel can repeat duplex in 1 year. -We will readdress statin titration -Repeat lipid panel today -Asymptomatic with no syncope or near syncopal episodes -No strokelike symptoms   3.  Heart failure with preserved LVEF: -Last echocardiogram 10/12/2018 with no change in systolic function or AVR -Appears euvolemic on exam -No LE swelling, orthopnea or PND    Past Medical History:  Diagnosis Date  . AKI (acute kidney injury) ([facility] 07/29/2017  . Arthritis 02-15-12   osteoarthritis,right knee, pain right wrist.,  . Arthritis    "hands, legs" (12/01/2016)  . Carotid artery disease ([facility]    Carotid [place]: R 40-59; L 80-99  . Chronic lower back pain   . Complication of anesthesia 02-15-12   very confused, and very slow to awaken after anesthesia  . Coronary artery disease 02-15-12   Heart valve replaced   . Dementia ([facility]   .  Depression   . Hypercholesterolemia 02-15-12  . Hypertension   . Memory loss   . PAD (peripheral artery disease) ([place])   . S/P AVR (aortic valve replacement)    Mechanical [employer] AVR // Echo 12/17: Moderate LVH, EF 60-65, normal wall motion, grade 1 diastolic dysfunction, mechanical AVR okay with mean gradient 8 mmHg, MAC, mild mitral stenosis (mean gradient 2 mmHg), mild MR,  mild RAE, PASP 25  . Urinary frequency     Past Surgical History:  Procedure Laterality Date  . ABDOMINAL AORTAGRAM  12/01/2016  . ABDOMINAL AORTOGRAM N/A 12/01/2016   Procedure: ABDOMINAL AORTOGRAM;  Surgeon: Nelva Bush, MD;  Location: [facility];  Service: Cardiovascular;  Laterality: N/A;  . ANTERIOR CERVICAL DECOMP/DISCECTOMY FUSION       Decompression at the level of C3-C4 and C4-C5, allograft and plate from C3 to C%, microscope/notes 09/14/2010  . ANTERIOR CERVICAL DECOMP/DISCECTOMY FUSION N/A 07/11/2017   Procedure: CERVICAL FIVE-SIX ANTERIOR CERVICAL DECOMPRESSION/DISCECTOMY FUSION AND PLATE FIXATION;  Surgeon: Consuella Lose, MD;  Location: [facility];  Service: Neurosurgery;  Laterality: N/A;  CERVICAL FIVE-SIX ANTERIOR CERVICAL DECOMPRESSION/DISCECTOMY FUSION AND PLATE FIXATION  . BACK SURGERY    . CARDIAC VALVE REPLACEMENT    . CARPAL TUNNEL RELEASE Left 01/2003   Archie Endo 09/14/2010  . JOINT REPLACEMENT    . LOWER EXTREMITY ANGIOGRAPHY  12/01/2016  . LOWER EXTREMITY ANGIOGRAPHY Bilateral 12/01/2016   Procedure: Lower Extremity Angiography;  Surgeon: Nelva Bush, MD;  Location: [facility];  Service: Cardiovascular;  Laterality: Bilateral;  . MECHANICAL AORTIC VALVE REPLACEMENT  1993   Archie Endo 11/18/2016  . SHOULDER OPEN ROTATOR CUFF REPAIR Left 02/15/2012  . TOTAL KNEE ARTHROPLASTY  02/20/2012   Procedure: TOTAL KNEE ARTHROPLASTY;  Surgeon: Gearlean Alf, MD;  Location: [facility];  Service: Orthopedics;  Laterality: Right;  . TRANSURETHRAL RESECTION OF PROSTATE  02/01/2010   Archie Endo 02/06/2010     Current Outpatient Medications  Medication Sig Dispense Refill  . amLODipine (NORVASC) 5 MG tablet Take 1 tablet (5 mg total) by mouth daily. 90 tablet 3  . buPROPion (WELLBUTRIN) 75 MG tablet TAKE 1 TABLET BY MOUTH EVERY DAY 90 tablet 3  . finasteride (PROSCAR) 5 MG tablet TAKE 1 TABLET (5 MG TOTAL) BY MOUTH DAILY. 90 tablet 3  . memantine (NAMENDA) 5 MG tablet  TAKE 1 TABLET BY MOUTH TWICE A DAY 180 tablet 3  . mirabegron ER (MYRBETRIQ) 25 MG TB24 tablet Take 1 tablet (25 mg total) by mouth daily. 30 tablet   . pravastatin (PRAVACHOL) 40 MG tablet TAKE 1 TABLET BY MOUTH EVERY DAY IN THE EVENING 90 tablet 3  . tamsulosin (FLOMAX) 0.4 MG CAPS capsule TAKE ONE CAPSULE BY MOUTH EVERY DAY 90 capsule 3  . warfarin (COUMADIN) 5 MG tablet TAKE 1 TABLET AS DIRECTED 90 tablet 4   No current facility-administered medications for this visit.     Allergies:   Patient has no known allergies.    Social History:  The patient  reports that he quit smoking about 23 years ago. His smoking use included cigarettes. He has a 50.00 pack-year smoking history. He has never used smokeless tobacco. He reports that he does not drink alcohol or use drugs.   Family History:  The patient'sfamily history includes Heart attack in his father; Heart disease in his mother; Hyperlipidemia in his father and mother; Hypertension in his father and mother; Varicose Veins in his mother.     ROS:  Please see the history  of present illness.   Otherwise, review of systems are positive for {NONE DEFAULTED:18576::"none"}.   All other systems are reviewed and negative.    PHYSICAL EXAM: VS:  There were no vitals taken for this visit. , BMI There is no height or weight on file to calculate BMI. GEN: Well nourished, well developed, in no acute distress HEENT: normal Neck: no JVD, carotid bruits, or masses Cardiac: ***RRR; no murmurs, rubs, or gallops,no edema  Respiratory:  clear to auscultation bilaterally, normal work of breathing GI: soft, nontender, nondistended, + BS MS: no deformity or atrophy Skin: warm and dry, no rash Neuro:  Strength and sensation are intact Psych: euthymic mood, full affect   EKG:  EKG {ACTION; IS/IS TIW:58099833} ordered today. The ekg ordered today demonstrates ***   Recent Labs: 01/16/2019: ALT 8; BUN 24; Creatinine, Ser 1.65; Hemoglobin 14.1; Platelets  241; Potassium 4.4; Sodium 138    Lipid Panel    Component Value Date/Time   CHOL 191 01/03/2018 1421   TRIG 167 (H) 01/03/2018 1421   HDL 44 01/03/2018 1421   CHOLHDL 4.3 01/03/2018 1421   CHOLHDL 4.7 06/10/2015 1501   VLDL 41 (H) 06/10/2015 1501   LDLCALC 114 (H) 01/03/2018 1421   LDLDIRECT 134.0 02/03/2014 1644    Wt Readings from Last 3 Encounters:  01/16/19 176 lb 6.4 oz (80 kg)  09/27/18 175 lb (79.4 kg)  03/21/18 171 lb 12.8 oz (77.9 kg)    Other studies Reviewed: Additional studies/ records that were reviewed today include:   Carotid Dopplers 06/2018 Summary: Right Carotid: Velocities in the right ICA are consistent with a 1-39% stenosis. Hemodynamically significant plaque >50% visualized in the CCA. Unable to obtain elevated velocities as seen on prior exam.  Left Carotid: Velocities in the left ICA are consistent with a 60-79% stenosis. Non-hemodynamically significant plaque noted in the CCA. The ECA appears >50% stenosed. Significant increase in diastolic velocity compared to prior. Recommend vascular consult for possible carotid revascularization. Velocities are elevated and stable compared to prior exam.  2D Echo 04/2016  - LVEF 60-65%, moderate LVH, normal wall motion, s/p mechanical bileaflet AVR without obstruction, MAC with mild mitral stenosis and regurgitation, normal LA, trivial TR, RVSP 25 mmHg, normal IVC.  NST 03/2017 Study Highlights     Nuclear stress EF: 56%.  There was no ST segment deviation noted during stress.  This is a low risk study.  Normal perfusion. LVEF 56% with normal wall motion. This is a low risk study.   Carotid duplex 12/21/2018: Right Carotid: Velocities in the right ICA are now consistent with a 40-59%                stenosis. Non-hemodynamically significant plaque <50% noted in                the CCA.  The RICA velocities are elevated and have increased                compared to the prior exam.  Left Carotid: Velocities in the left ICA are consistent with a 40-59% stenosis.               Non-hemodynamically significant plaque <50% noted in the CCA. The               ECA appears >50% stenosed. The LICA velocities are elevated and               stable compared to the prior exam.  Vertebrals:  Bilateral vertebral  arteries demonstrate antegrade flow. Small              caliber right vertebral artery. Subclavians: Normal flow hemodynamics were seen in bilateral subclavian              arteries.  Echocardiogram 10/12/2018:  1. The left ventricle has normal systolic function with an ejection fraction of 60-65%. The cavity size was normal. There is mildly increased left ventricular wall thickness. Left ventricular diastolic Doppler parameters are consistent with impaired  relaxation. No evidence of left ventricular regional wall motion abnormalities.  2. The right ventricle has normal systolic function. The cavity was normal. There is no increase in right ventricular wall thickness.  3. Left atrial size was mildly dilated.  4. The mitral valve is degenerative. Mild calcification of the mitral valve leaflet. There is moderate mitral annular calcification present. No evidence of mitral valve stenosis.  5. There is a mechanical aortic valve prosthesis. Mean gradient 8 mmHg, no significant prosthetic valve stenosis. No significant regurgitation.  6. The aortic root is normal in size and structure.  7. The IVC was normal in size. No complete TR doppler jet so unable to estimate PA systolic pressure.    ASSESSMENT AND PLAN:  1.  ***   Current medicines are reviewed at length with the patient today.  The patient {ACTIONS; HAS/DOES NOT HAVE:19233} concerns regarding medicines.  The following changes have been made:  {PLAN; NO CHANGE:13088:s}  Labs/ tests ordered today include: *** No orders  of the defined types were placed in this encounter.    Disposition:   FU with *** in {gen number 6-14:431540} {Days to years:10300}  Signed, Kathyrn Drown, NP  02/27/2019 4:59 PM    [facility] [address], [place], [place]  [address] Phone: [phone]; Fax: [phone]

## 2019-02-28 ENCOUNTER — Other Ambulatory Visit: Payer: Self-pay

## 2019-02-28 ENCOUNTER — Emergency Department (HOSPITAL_COMMUNITY): Payer: Medicare Other

## 2019-02-28 ENCOUNTER — Inpatient Hospital Stay (HOSPITAL_COMMUNITY): Payer: Medicare Other

## 2019-02-28 ENCOUNTER — Encounter (HOSPITAL_COMMUNITY): Payer: Self-pay

## 2019-02-28 ENCOUNTER — Telehealth: Payer: Self-pay | Admitting: *Deleted

## 2019-02-28 ENCOUNTER — Inpatient Hospital Stay (HOSPITAL_COMMUNITY)
Admission: EM | Admit: 2019-02-28 | Discharge: 2019-03-08 | DRG: 280 | Disposition: A | Discharge status: Hospice | Payer: Medicare Other | Attending: Family Medicine | Admitting: Family Medicine

## 2019-02-28 ENCOUNTER — Ambulatory Visit: Payer: Medicare Other | Admitting: Cardiology

## 2019-02-28 DIAGNOSIS — I739 Peripheral vascular disease, unspecified: Secondary | ICD-10-CM | POA: Diagnosis present

## 2019-02-28 DIAGNOSIS — Z8249 Family history of ischemic heart disease and other diseases of the circulatory system: Secondary | ICD-10-CM

## 2019-02-28 DIAGNOSIS — D649 Anemia, unspecified: Secondary | ICD-10-CM | POA: Diagnosis present

## 2019-02-28 DIAGNOSIS — R791 Abnormal coagulation profile: Secondary | ICD-10-CM | POA: Diagnosis not present

## 2019-02-28 DIAGNOSIS — R5383 Other fatigue: Secondary | ICD-10-CM | POA: Diagnosis present

## 2019-02-28 DIAGNOSIS — I340 Nonrheumatic mitral (valve) insufficiency: Secondary | ICD-10-CM

## 2019-02-28 DIAGNOSIS — R570 Cardiogenic shock: Secondary | ICD-10-CM | POA: Diagnosis not present

## 2019-02-28 DIAGNOSIS — F0150 Vascular dementia without behavioral disturbance: Secondary | ICD-10-CM | POA: Diagnosis not present

## 2019-02-28 DIAGNOSIS — R778 Other specified abnormalities of plasma proteins: Secondary | ICD-10-CM | POA: Diagnosis not present

## 2019-02-28 DIAGNOSIS — Z515 Encounter for palliative care: Secondary | ICD-10-CM | POA: Diagnosis not present

## 2019-02-28 DIAGNOSIS — I5021 Acute systolic (congestive) heart failure: Secondary | ICD-10-CM | POA: Diagnosis not present

## 2019-02-28 DIAGNOSIS — I959 Hypotension, unspecified: Secondary | ICD-10-CM | POA: Diagnosis not present

## 2019-02-28 DIAGNOSIS — F0390 Unspecified dementia without behavioral disturbance: Secondary | ICD-10-CM | POA: Diagnosis present

## 2019-02-28 DIAGNOSIS — G92 Toxic encephalopathy: Secondary | ICD-10-CM | POA: Diagnosis not present

## 2019-02-28 DIAGNOSIS — Z66 Do not resuscitate: Secondary | ICD-10-CM | POA: Diagnosis not present

## 2019-02-28 DIAGNOSIS — G8929 Other chronic pain: Secondary | ICD-10-CM | POA: Diagnosis present

## 2019-02-28 DIAGNOSIS — N179 Acute kidney failure, unspecified: Secondary | ICD-10-CM | POA: Diagnosis not present

## 2019-02-28 DIAGNOSIS — M545 Low back pain: Secondary | ICD-10-CM | POA: Diagnosis present

## 2019-02-28 DIAGNOSIS — I361 Nonrheumatic tricuspid (valve) insufficiency: Secondary | ICD-10-CM | POA: Diagnosis not present

## 2019-02-28 DIAGNOSIS — I130 Hypertensive heart and chronic kidney disease with heart failure and stage 1 through stage 4 chronic kidney disease, or unspecified chronic kidney disease: Secondary | ICD-10-CM | POA: Diagnosis present

## 2019-02-28 DIAGNOSIS — R5381 Other malaise: Secondary | ICD-10-CM | POA: Diagnosis not present

## 2019-02-28 DIAGNOSIS — N1831 Chronic kidney disease, stage 3a: Secondary | ICD-10-CM | POA: Diagnosis not present

## 2019-02-28 DIAGNOSIS — J9601 Acute respiratory failure with hypoxia: Secondary | ICD-10-CM | POA: Diagnosis present

## 2019-02-28 DIAGNOSIS — Z87891 Personal history of nicotine dependence: Secondary | ICD-10-CM | POA: Diagnosis not present

## 2019-02-28 DIAGNOSIS — J439 Emphysema, unspecified: Secondary | ICD-10-CM | POA: Diagnosis present

## 2019-02-28 DIAGNOSIS — M542 Cervicalgia: Secondary | ICD-10-CM | POA: Diagnosis present

## 2019-02-28 DIAGNOSIS — J029 Acute pharyngitis, unspecified: Secondary | ICD-10-CM | POA: Diagnosis not present

## 2019-02-28 DIAGNOSIS — R69 Illness, unspecified: Secondary | ICD-10-CM | POA: Diagnosis not present

## 2019-02-28 DIAGNOSIS — Z9889 Other specified postprocedural states: Secondary | ICD-10-CM

## 2019-02-28 DIAGNOSIS — Z7401 Bed confinement status: Secondary | ICD-10-CM | POA: Diagnosis not present

## 2019-02-28 DIAGNOSIS — M2550 Pain in unspecified joint: Secondary | ICD-10-CM | POA: Diagnosis not present

## 2019-02-28 DIAGNOSIS — Z981 Arthrodesis status: Secondary | ICD-10-CM

## 2019-02-28 DIAGNOSIS — R627 Adult failure to thrive: Secondary | ICD-10-CM | POA: Diagnosis not present

## 2019-02-28 DIAGNOSIS — I429 Cardiomyopathy, unspecified: Secondary | ICD-10-CM | POA: Diagnosis present

## 2019-02-28 DIAGNOSIS — E7800 Pure hypercholesterolemia, unspecified: Secondary | ICD-10-CM | POA: Diagnosis present

## 2019-02-28 DIAGNOSIS — R42 Dizziness and giddiness: Secondary | ICD-10-CM | POA: Diagnosis not present

## 2019-02-28 DIAGNOSIS — I714 Abdominal aortic aneurysm, without rupture: Secondary | ICD-10-CM | POA: Diagnosis present

## 2019-02-28 DIAGNOSIS — R400 Somnolence: Secondary | ICD-10-CM | POA: Diagnosis not present

## 2019-02-28 DIAGNOSIS — M5489 Other dorsalgia: Secondary | ICD-10-CM | POA: Diagnosis not present

## 2019-02-28 DIAGNOSIS — K7689 Other specified diseases of liver: Secondary | ICD-10-CM

## 2019-02-28 DIAGNOSIS — I6522 Occlusion and stenosis of left carotid artery: Secondary | ICD-10-CM | POA: Diagnosis present

## 2019-02-28 DIAGNOSIS — R52 Pain, unspecified: Secondary | ICD-10-CM | POA: Diagnosis not present

## 2019-02-28 DIAGNOSIS — R0600 Dyspnea, unspecified: Secondary | ICD-10-CM

## 2019-02-28 DIAGNOSIS — N183 Chronic kidney disease, stage 3 unspecified: Secondary | ICD-10-CM | POA: Diagnosis present

## 2019-02-28 DIAGNOSIS — I491 Atrial premature depolarization: Secondary | ICD-10-CM | POA: Diagnosis present

## 2019-02-28 DIAGNOSIS — Z85828 Personal history of other malignant neoplasm of skin: Secondary | ICD-10-CM

## 2019-02-28 DIAGNOSIS — Z23 Encounter for immunization: Secondary | ICD-10-CM

## 2019-02-28 DIAGNOSIS — R0902 Hypoxemia: Secondary | ICD-10-CM | POA: Diagnosis not present

## 2019-02-28 DIAGNOSIS — I5043 Acute on chronic combined systolic (congestive) and diastolic (congestive) heart failure: Secondary | ICD-10-CM | POA: Diagnosis not present

## 2019-02-28 DIAGNOSIS — M109 Gout, unspecified: Secondary | ICD-10-CM | POA: Diagnosis present

## 2019-02-28 DIAGNOSIS — F0280 Dementia in other diseases classified elsewhere without behavioral disturbance: Secondary | ICD-10-CM | POA: Diagnosis not present

## 2019-02-28 DIAGNOSIS — D689 Coagulation defect, unspecified: Secondary | ICD-10-CM

## 2019-02-28 DIAGNOSIS — Z741 Need for assistance with personal care: Secondary | ICD-10-CM | POA: Diagnosis not present

## 2019-02-28 DIAGNOSIS — I2510 Atherosclerotic heart disease of native coronary artery without angina pectoris: Secondary | ICD-10-CM | POA: Diagnosis present

## 2019-02-28 DIAGNOSIS — K7290 Hepatic failure, unspecified without coma: Secondary | ICD-10-CM | POA: Diagnosis present

## 2019-02-28 DIAGNOSIS — Z20828 Contact with and (suspected) exposure to other viral communicable diseases: Secondary | ICD-10-CM | POA: Diagnosis present

## 2019-02-28 DIAGNOSIS — Z7901 Long term (current) use of anticoagulants: Secondary | ICD-10-CM

## 2019-02-28 DIAGNOSIS — E785 Hyperlipidemia, unspecified: Secondary | ICD-10-CM | POA: Diagnosis present

## 2019-02-28 DIAGNOSIS — R710 Precipitous drop in hematocrit: Secondary | ICD-10-CM | POA: Diagnosis not present

## 2019-02-28 DIAGNOSIS — K7200 Acute and subacute hepatic failure without coma: Secondary | ICD-10-CM | POA: Diagnosis not present

## 2019-02-28 DIAGNOSIS — I447 Left bundle-branch block, unspecified: Secondary | ICD-10-CM | POA: Diagnosis not present

## 2019-02-28 DIAGNOSIS — D682 Hereditary deficiency of other clotting factors: Secondary | ICD-10-CM | POA: Diagnosis present

## 2019-02-28 DIAGNOSIS — R918 Other nonspecific abnormal finding of lung field: Secondary | ICD-10-CM | POA: Diagnosis not present

## 2019-02-28 DIAGNOSIS — K219 Gastro-esophageal reflux disease without esophagitis: Secondary | ICD-10-CM | POA: Diagnosis present

## 2019-02-28 DIAGNOSIS — I10 Essential (primary) hypertension: Secondary | ICD-10-CM | POA: Diagnosis not present

## 2019-02-28 DIAGNOSIS — I214 Non-ST elevation (NSTEMI) myocardial infarction: Secondary | ICD-10-CM | POA: Diagnosis present

## 2019-02-28 DIAGNOSIS — J189 Pneumonia, unspecified organism: Secondary | ICD-10-CM | POA: Diagnosis present

## 2019-02-28 DIAGNOSIS — Z952 Presence of prosthetic heart valve: Secondary | ICD-10-CM

## 2019-02-28 DIAGNOSIS — I129 Hypertensive chronic kidney disease with stage 1 through stage 4 chronic kidney disease, or unspecified chronic kidney disease: Secondary | ICD-10-CM | POA: Diagnosis not present

## 2019-02-28 DIAGNOSIS — N189 Chronic kidney disease, unspecified: Secondary | ICD-10-CM | POA: Diagnosis not present

## 2019-02-28 DIAGNOSIS — R0602 Shortness of breath: Secondary | ICD-10-CM | POA: Diagnosis not present

## 2019-02-28 DIAGNOSIS — E872 Acidosis: Secondary | ICD-10-CM | POA: Diagnosis present

## 2019-02-28 DIAGNOSIS — Z79899 Other long term (current) drug therapy: Secondary | ICD-10-CM

## 2019-02-28 DIAGNOSIS — G309 Alzheimer's disease, unspecified: Secondary | ICD-10-CM | POA: Diagnosis not present

## 2019-02-28 DIAGNOSIS — Z7982 Long term (current) use of aspirin: Secondary | ICD-10-CM

## 2019-02-28 DIAGNOSIS — N400 Enlarged prostate without lower urinary tract symptoms: Secondary | ICD-10-CM | POA: Diagnosis present

## 2019-02-28 DIAGNOSIS — R7401 Elevation of levels of liver transaminase levels: Secondary | ICD-10-CM

## 2019-02-28 DIAGNOSIS — M159 Polyosteoarthritis, unspecified: Secondary | ICD-10-CM | POA: Diagnosis present

## 2019-02-28 DIAGNOSIS — Z6825 Body mass index (BMI) 25.0-25.9, adult: Secondary | ICD-10-CM | POA: Diagnosis not present

## 2019-02-28 DIAGNOSIS — Z8349 Family history of other endocrine, nutritional and metabolic diseases: Secondary | ICD-10-CM

## 2019-02-28 DIAGNOSIS — F329 Major depressive disorder, single episode, unspecified: Secondary | ICD-10-CM | POA: Diagnosis present

## 2019-02-28 DIAGNOSIS — R7989 Other specified abnormal findings of blood chemistry: Secondary | ICD-10-CM | POA: Diagnosis not present

## 2019-02-28 HISTORY — DX: Left bundle-branch block, unspecified: I44.7

## 2019-02-28 LAB — CBC WITH DIFFERENTIAL/PLATELET
Abs Immature Granulocytes: 0.07 10*3/uL (ref 0.00–0.07)
Basophils Absolute: 0 10*3/uL (ref 0.0–0.1)
Basophils Relative: 0 %
Eosinophils Absolute: 0 10*3/uL (ref 0.0–0.5)
Eosinophils Relative: 0 %
HCT: 38.5 % — ABNORMAL LOW (ref 39.0–52.0)
Hemoglobin: 12.6 g/dL — ABNORMAL LOW (ref 13.0–17.0)
Immature Granulocytes: 1 %
Lymphocytes Relative: 6 %
Lymphs Abs: 0.7 10*3/uL (ref 0.7–4.0)
MCH: 32.3 pg (ref 26.0–34.0)
MCHC: 32.7 g/dL (ref 30.0–36.0)
MCV: 98.7 fL (ref 80.0–100.0)
Monocytes Absolute: 1 10*3/uL (ref 0.1–1.0)
Monocytes Relative: 8 %
Neutro Abs: 11.5 10*3/uL — ABNORMAL HIGH (ref 1.7–7.7)
Neutrophils Relative %: 85 %
Platelets: 205 10*3/uL (ref 150–400)
RBC: 3.9 MIL/uL — ABNORMAL LOW (ref 4.22–5.81)
RDW: 14.6 % (ref 11.5–15.5)
WBC: 13.4 10*3/uL — ABNORMAL HIGH (ref 4.0–10.5)
nRBC: 0 % (ref 0.0–0.2)

## 2019-02-28 LAB — URINALYSIS, ROUTINE W REFLEX MICROSCOPIC
Bilirubin Urine: NEGATIVE
Glucose, UA: NEGATIVE mg/dL
Hgb urine dipstick: NEGATIVE
Ketones, ur: NEGATIVE mg/dL
Nitrite: NEGATIVE
Protein, ur: NEGATIVE mg/dL
Specific Gravity, Urine: 1.024 (ref 1.005–1.030)
pH: 5 (ref 5.0–8.0)

## 2019-02-28 LAB — SARS CORONAVIRUS 2 (TAT 6-24 HRS)
SARS Coronavirus 2: NEGATIVE
SARS Coronavirus 2: NEGATIVE

## 2019-02-28 LAB — TROPONIN I (HIGH SENSITIVITY)
Troponin I (High Sensitivity): 8649 ng/L (ref <18)
Troponin I (High Sensitivity): 11342 ng/L (ref <18)
Troponin I (High Sensitivity): 11004 ng/L (ref <18)
Troponin I (High Sensitivity): 12409 ng/L (ref <18)

## 2019-02-28 LAB — COMPREHENSIVE METABOLIC PANEL
ALT: 20 U/L (ref 0–44)
AST: 50 U/L — ABNORMAL HIGH (ref 15–41)
Albumin: 3.7 g/dL (ref 3.5–5.0)
Alkaline Phosphatase: 51 U/L (ref 38–126)
Anion gap: 12 (ref 5–15)
BUN: 37 mg/dL — ABNORMAL HIGH (ref 8–23)
CO2: 15 mmol/L — ABNORMAL LOW (ref 22–32)
Calcium: 8.9 mg/dL (ref 8.9–10.3)
Chloride: 111 mmol/L (ref 98–111)
Creatinine, Ser: 2.1 mg/dL — ABNORMAL HIGH (ref 0.61–1.24)
GFR calc Af Amer: 32 mL/min — ABNORMAL LOW (ref >60)
GFR calc non Af Amer: 28 mL/min — ABNORMAL LOW (ref >60)
Glucose, Bld: 196 mg/dL — ABNORMAL HIGH (ref 70–99)
Potassium: 4.6 mmol/L (ref 3.5–5.1)
Sodium: 138 mmol/L (ref 135–145)
Total Bilirubin: 2.7 mg/dL — ABNORMAL HIGH (ref 0.3–1.2)
Total Protein: 6.8 g/dL (ref 6.5–8.1)

## 2019-02-28 LAB — FERRITIN: Ferritin: 94 ng/mL (ref 24–336)

## 2019-02-28 LAB — LACTIC ACID, PLASMA
Lactic Acid, Venous: 2.1 mmol/L (ref 0.5–1.9)
Lactic Acid, Venous: 1.8 mmol/L (ref 0.5–1.9)

## 2019-02-28 LAB — APTT: aPTT: 51 seconds — ABNORMAL HIGH (ref 24–36)

## 2019-02-28 LAB — SEDIMENTATION RATE: Sed Rate: 24 mm/hr — ABNORMAL HIGH (ref 0–16)

## 2019-02-28 LAB — ECHOCARDIOGRAM COMPLETE
Height: 66 in
Weight: 2800 oz

## 2019-02-28 LAB — PROTIME-INR
INR: 4.3 (ref 0.8–1.2)
Prothrombin Time: 40.9 seconds — ABNORMAL HIGH (ref 11.4–15.2)

## 2019-02-28 LAB — D-DIMER, QUANTITATIVE: D-Dimer, Quant: <0.27 ug/mL-FEU (ref 0.00–0.50)

## 2019-02-28 LAB — C-REACTIVE PROTEIN: CRP: 10.9 mg/dL — ABNORMAL HIGH (ref <1.0)

## 2019-02-28 LAB — BRAIN NATRIURETIC PEPTIDE: B Natriuretic Peptide: 1533.4 pg/mL — ABNORMAL HIGH (ref 0.0–100.0)

## 2019-02-28 LAB — PROCALCITONIN: Procalcitonin: <0.1 ng/mL

## 2019-02-28 MED ORDER — SODIUM CHLORIDE 0.9 % IV BOLUS (SEPSIS)
500.0000 mL | Freq: Once | INTRAVENOUS | Status: AC
  Administered 2019-02-28: 500 mL via INTRAVENOUS

## 2019-02-28 MED ORDER — BUPROPION HCL 75 MG PO TABS
75.0000 mg | ORAL_TABLET | Freq: Every day | ORAL | Status: DC
  Administered 2019-03-02 – 2019-03-04 (×3): 75 mg via ORAL
  Filled 2019-02-28 (×5): qty 1

## 2019-02-28 MED ORDER — METOPROLOL TARTRATE 25 MG PO TABS: 25.0000 mg | ORAL_TABLET | Freq: Two times a day (BID) | ORAL | Status: DC

## 2019-02-28 MED ORDER — ATORVASTATIN CALCIUM 40 MG PO TABS
40.0000 mg | ORAL_TABLET | Freq: Every day | ORAL | Status: DC
  Administered 2019-03-01 – 2019-03-02 (×2): 40 mg via ORAL
  Filled 2019-02-28 (×3): qty 1

## 2019-02-28 MED ORDER — WARFARIN - PHARMACIST DOSING INPATIENT: Freq: Every day | Status: DC

## 2019-02-28 MED ORDER — OXYCODONE-ACETAMINOPHEN 5-325 MG PO TABS
1.0000 | ORAL_TABLET | Freq: Once | ORAL | Status: AC
  Administered 2019-02-28: 1 via ORAL
  Filled 2019-02-28: qty 1

## 2019-02-28 MED ORDER — PRAVASTATIN SODIUM 40 MG PO TABS: 40.0000 mg | ORAL_TABLET | Freq: Every day | ORAL | Status: DC

## 2019-02-28 MED ORDER — NITROGLYCERIN 0.4 MG SL SUBL: 0.4000 mg | SUBLINGUAL_TABLET | SUBLINGUAL | Status: DC | PRN

## 2019-02-28 MED ORDER — SODIUM CHLORIDE 0.9 % IV SOLN: INTRAVENOUS | Status: DC

## 2019-02-28 MED ORDER — ASPIRIN EC 81 MG PO TBEC
81.0000 mg | DELAYED_RELEASE_TABLET | Freq: Every day | ORAL | Status: DC
  Administered 2019-03-02 – 2019-03-04 (×3): 81 mg via ORAL
  Filled 2019-02-28 (×3): qty 1

## 2019-02-28 MED ORDER — ACETAMINOPHEN 650 MG RE SUPP: 650.0000 mg | Freq: Four times a day (QID) | RECTAL | Status: DC | PRN

## 2019-02-28 MED ORDER — ASPIRIN 325 MG PO TABS
325.0000 mg | ORAL_TABLET | Freq: Every day | ORAL | Status: DC
  Administered 2019-02-28: 325 mg via ORAL
  Filled 2019-02-28: qty 1

## 2019-02-28 MED ORDER — LEVOFLOXACIN IN D5W 500 MG/100ML IV SOLN
500.0000 mg | Freq: Once | INTRAVENOUS | Status: AC
  Administered 2019-02-28: 500 mg via INTRAVENOUS
  Filled 2019-02-28: qty 100

## 2019-02-28 MED ORDER — ACETAMINOPHEN 325 MG PO TABS: 650.0000 mg | ORAL_TABLET | Freq: Four times a day (QID) | ORAL | Status: DC | PRN

## 2019-02-28 MED ORDER — SODIUM CHLORIDE 0.9 % IV BOLUS
500.0000 mL | Freq: Once | INTRAVENOUS | Status: AC
  Administered 2019-02-28: 500 mL via INTRAVENOUS

## 2019-02-28 MED ORDER — FUROSEMIDE 10 MG/ML IJ SOLN
80.0000 mg | Freq: Once | INTRAMUSCULAR | Status: AC
  Administered 2019-02-28: 80 mg via INTRAVENOUS
  Filled 2019-02-28: qty 8

## 2019-02-28 MED ORDER — SODIUM CHLORIDE 0.9 % IV SOLN
INTRAVENOUS | Status: DC
  Administered 2019-02-28: 15:00:00 via INTRAVENOUS

## 2019-02-28 NOTE — ED Triage Notes (Addendum)
Pt stated that he has had some neck pain that has been constant today & started about a year ago. He c/o "a little" SOB (per pt). [facility] reported that pt was sating 95% on 3L O2 via n/c, 90 SBP after a 1L NS fluid bolus. A/Ox4 upon arrival, pt denies fevers.

## 2019-02-28 NOTE — ED Notes (Signed)
Paged admitting per rn  

## 2019-02-28 NOTE — ED Provider Notes (Signed)
[facility] EMERGENCY DEPARTMENT Provider Note   CSN: [id number] Arrival date & time: 02/28/19  0102     History   Chief Complaint No chief complaint on file.   HPI Juan Li is a 85 y.o. male.     Patient complains some some shortness of breath and worsening of his chronic neck pain.  The history is provided by the patient. No language interpreter was used.  Shortness of Breath Severity:  Mild Onset quality:  Sudden Timing:  Intermittent Progression:  Improving Chronicity:  New Context: activity   Relieved by:  Nothing Worsened by:  Nothing Ineffective treatments:  None tried Associated symptoms: no abdominal pain, no chest pain, no cough, no headaches and no rash     Past Medical History:  Diagnosis Date  . AKI (acute kidney injury) ([place]) 07/29/2017  . Arthritis 02-15-12   osteoarthritis,right knee, pain right wrist.,  . Arthritis    "hands, legs" (12/01/2016)  . Carotid artery disease ([place])    Carotid US 1/18: R 40-59; L 80-99  . Chronic lower back pain   . Complication of anesthesia 02-15-12   very confused, and very slow to awaken after anesthesia  . Coronary artery disease 02-15-12   Heart valve replaced   . Dementia ([place])   . Depression   . Hypercholesterolemia 02-15-12  . Hypertension   . Memory loss   . PAD (peripheral artery disease) ([place])   . S/P AVR (aortic valve replacement)    Mechanical [employer] AVR // Echo 12/17: Moderate LVH, EF 60-65, normal wall motion, grade 1 diastolic dysfunction, mechanical AVR okay with mean gradient 8 mmHg, MAC, mild mitral stenosis (mean gradient 2 mmHg), mild MR, mild RAE, PASP 25  . Urinary frequency     Patient Active Problem List   Diagnosis Date Noted  . Diarrhea 09/27/2017  . S/P AVR (aortic valve replacement)   . Hx of excision of lamina of cervical vertebra for decompression of spinal cord 08/30/2017  . Elevated INR   . Cervical myelopathy ([place]) 07/11/2017  . (HFpEF) heart  failure with preserved ejection fraction ([place]) 06/24/2017  . Peripheral vascular disease ([place]) 12/16/2016  . Acute gout involving toe of left foot 12/16/2016  . Cervical spondylosis with myelopathy 01/28/2016  . Encounter for palliative care 11/21/2014  . Carotid artery disease ([place]) 07/18/2014  . Stenosis of cervical spine region 07/18/2014  . Bradycardia 10/06/2013  . Unspecified vitamin D deficiency 11/30/2012  . Chronic diastolic CHF (congestive heart failure) ([place]) 08/21/2012  . Gait abnormality 09/02/2011  . Chronic anticoagulation 06/28/2010  . AAA 01/20/2010  . HEART VALVE REPLACEMENT, HX OF 12/31/2008  . CKD (chronic kidney disease) stage 3, GFR 30-59 ml/min 08/06/2008  . Dementia ([place]) 05/25/2007  . OSTEOARTHROSIS, GENERALIZED, MULTIPLE SITES 09/18/2006  . HYPERCHOLESTEROLEMIA 06/29/2006  . Gouty arthropathy 06/29/2006  . CARPAL TUNNEL SYNDROME 06/29/2006  . Essential hypertension 06/29/2006  . GASTROESOPHAGEAL REFLUX, NO ESOPHAGITIS 06/29/2006  . BPH (benign prostatic hyperplasia) 06/29/2006  . Skin cancer 06/29/2006  . BACK PAIN, LOW 06/29/2006  . ROTATOR CUFF TENDONITIS 06/29/2006  . TOBACCO USE, QUIT 06/29/2006    Past Surgical History:  Procedure Laterality Date  . ABDOMINAL AORTAGRAM  12/01/2016  . ABDOMINAL AORTOGRAM N/A 12/01/2016   Procedure: ABDOMINAL AORTOGRAM;  Surgeon: Nelva Bush, MD;  Location: [facility];  Service: Cardiovascular;  Laterality: N/A;  . ANTERIOR CERVICAL DECOMP/DISCECTOMY FUSION       Decompression at the level of C3-C4 and C4-C5, allograft  and plate from C3 to C%, microscope/notes 09/14/2010  . ANTERIOR CERVICAL DECOMP/DISCECTOMY FUSION N/A 07/11/2017   Procedure: CERVICAL FIVE-SIX ANTERIOR CERVICAL DECOMPRESSION/DISCECTOMY FUSION AND PLATE FIXATION;  Surgeon: Consuella Lose, MD;  Location: [facility];  Service: Neurosurgery;  Laterality: N/A;  CERVICAL FIVE-SIX ANTERIOR CERVICAL DECOMPRESSION/DISCECTOMY FUSION AND PLATE FIXATION   . BACK SURGERY    . CARDIAC VALVE REPLACEMENT    . CARPAL TUNNEL RELEASE Left 01/2003   Archie Endo 09/14/2010  . JOINT REPLACEMENT    . LOWER EXTREMITY ANGIOGRAPHY  12/01/2016  . LOWER EXTREMITY ANGIOGRAPHY Bilateral 12/01/2016   Procedure: Lower Extremity Angiography;  Surgeon: Nelva Bush, MD;  Location: [facility];  Service: Cardiovascular;  Laterality: Bilateral;  . MECHANICAL AORTIC VALVE REPLACEMENT  1993   Archie Endo 11/18/2016  . SHOULDER OPEN ROTATOR CUFF REPAIR Left 02/15/2012  . TOTAL KNEE ARTHROPLASTY  02/20/2012   Procedure: TOTAL KNEE ARTHROPLASTY;  Surgeon: Gearlean Alf, MD;  Location: [facility];  Service: Orthopedics;  Laterality: Right;  . TRANSURETHRAL RESECTION OF PROSTATE  02/01/2010   Archie Endo 02/06/2010        Home Medications    Prior to Admission medications   Medication Sig Start Date End Date Taking? Authorizing Provider  acetaminophen (TYLENOL) 500 MG tablet Take 1,000 mg by mouth every 6 (six) hours as needed for mild pain.   Yes [provider]  amLODipine (NORVASC) 5 MG tablet Take 1 tablet (5 mg total) by mouth daily. 12/11/18  Yes [name], Jamal Collin, MD  buPROPion (WELLBUTRIN) 75 MG tablet TAKE 1 TABLET BY MOUTH EVERY DAY Patient taking differently: Take 75 mg by mouth daily.  08/13/18  Yes [name], Jamal Collin, MD  finasteride (PROSCAR) 5 MG tablet TAKE 1 TABLET (5 MG TOTAL) BY MOUTH DAILY. Patient taking differently: Take 5 mg by mouth daily.  08/20/18  Yes [name], Jamal Collin, MD  memantine (NAMENDA) 5 MG tablet TAKE 1 TABLET BY MOUTH TWICE A DAY Patient taking differently: Take 5 mg by mouth 2 (two) times daily.  10/26/18  Yes [name], Jamal Collin, MD  Menthol-Methyl Salicylate (MUSCLE RUB) 10-15 % CREA Apply 1 application topically as needed for muscle pain.   Yes [provider]  mirabegron ER (MYRBETRIQ) 25 MG TB24 tablet Take 1 tablet (25 mg total) by mouth daily. 01/18/19  Yes [name], Jamal Collin, MD  pravastatin (PRAVACHOL) 40 MG tablet  TAKE 1 TABLET BY MOUTH EVERY DAY IN THE EVENING Patient taking differently: Take 40 mg by mouth daily.  10/26/18  Yes [name], Jamal Collin, MD  tamsulosin (FLOMAX) 0.4 MG CAPS capsule TAKE ONE CAPSULE BY MOUTH EVERY DAY Patient taking differently: Take 0.4 mg by mouth daily.  07/21/18  Yes [name], Jamal Collin, MD  warfarin (COUMADIN) 5 MG tablet TAKE 1 TABLET AS DIRECTED Patient taking differently: Take 5 mg by mouth daily at 6 PM.  07/21/18  Yes [name], Jamal Collin, MD    Family History Family History  Problem Relation Age of Onset  . Heart disease Mother   . Hyperlipidemia Mother   . Hypertension Mother   . Varicose Veins Mother   . Hyperlipidemia Father   . Hypertension Father   . Heart attack Father     Social History Social History   Tobacco Use  . Smoking status: Former Smoker    Packs/day: 1.00    Years: 50.00    Pack years: 50.00    Types: Cigarettes    Quit date: 08/10/1995    Years since quitting: 23.5  .  Smokeless tobacco: Never Used  Substance Use Topics  . Alcohol use: No    Alcohol/week: 0.0 standard drinks    Frequency: Never    Comment: 12/01/2016 "nothing in the 2000s"  . Drug use: No     Allergies   Patient has no known allergies.   Review of Systems Review of Systems  Constitutional: Negative for appetite change and fatigue.  HENT: Negative for congestion, ear discharge and sinus pressure.        Neck pain  Eyes: Negative for discharge.  Respiratory: Positive for shortness of breath. Negative for cough.   Cardiovascular: Negative for chest pain.  Gastrointestinal: Negative for abdominal pain and diarrhea.  Genitourinary: Negative for frequency and hematuria.  Musculoskeletal: Negative for back pain.  Skin: Negative for rash.  Neurological: Negative for seizures and headaches.  Psychiatric/Behavioral: Negative for hallucinations.     Physical Exam Updated Vital Signs BP 92/74   Pulse 91   Temp 97.7 F (36.5 C) (Oral)   Resp 17   Ht _0   (1.676 m)   Wt 79.4 kg   SpO2 92%   BMI 28.25 kg/m   Physical Exam Vitals signs and nursing note reviewed.  Constitutional:      Appearance: He is well-developed.  HENT:     Head: Normocephalic.     Comments: Minimal tenderness posterior neck    Nose: Nose normal.  Eyes:     General: No scleral icterus.    Conjunctiva/sclera: Conjunctivae normal.  Neck:     Musculoskeletal: Neck supple.     Thyroid: No thyromegaly.  Cardiovascular:     Rate and Rhythm: Normal rate and regular rhythm.     Heart sounds: No murmur. No friction rub. No gallop.   Pulmonary:     Breath sounds: No stridor. No wheezing or rales.  Chest:     Chest wall: No tenderness.  Abdominal:     General: There is no distension.     Tenderness: There is no abdominal tenderness. There is no rebound.  Musculoskeletal: Normal range of motion.  Lymphadenopathy:     Cervical: No cervical adenopathy.  Skin:    Findings: No erythema or rash.  Neurological:     Mental Status: He is alert and oriented to person, place, and time.     Motor: No abnormal muscle tone.     Coordination: Coordination normal.  Psychiatric:        Behavior: Behavior normal.      ED Treatments / Results  Labs (all labs ordered are listed, but only abnormal results are displayed) Labs Reviewed  CBC WITH DIFFERENTIAL/PLATELET - Abnormal; Notable for the following components:      Result Value   WBC 13.4 (*)    RBC 3.90 (*)    Hemoglobin 12.6 (*)    HCT 38.5 (*)    Neutro Abs 11.5 (*)    All other components within normal limits  COMPREHENSIVE METABOLIC PANEL - Abnormal; Notable for the following components:   CO2 15 (*)    Glucose, Bld 196 (*)    BUN 37 (*)    Creatinine, Ser 2.10 (*)    AST 50 (*)    Total Bilirubin 2.7 (*)    GFR calc non Af Amer 28 (*)    GFR calc Af Amer 32 (*)    All other components within normal limits  PROTIME-INR - Abnormal; Notable for the following components:   Prothrombin Time 40.9 (*)    INR  4.3 (*)  All other components within normal limits  TROPONIN I (HIGH SENSITIVITY) - Abnormal; Notable for the following components:   Troponin I (High Sensitivity) 8,649 (*)    All other components within normal limits  CULTURE, BLOOD (ROUTINE X 2)  CULTURE, BLOOD (ROUTINE X 2)  URINE CULTURE  SARS CORONAVIRUS 2 (TAT 6-24 HRS)  SARS CORONAVIRUS 2 BY RT PCR (HOSPITAL ORDER, PERFORMED IN [facility])  LACTIC ACID, PLASMA  LACTIC ACID, PLASMA  APTT  URINALYSIS, ROUTINE W REFLEX MICROSCOPIC  BRAIN NATRIURETIC PEPTIDE  TROPONIN I (HIGH SENSITIVITY)    EKG EKG Interpretation  Date/Time:  Thursday February 28 2019 09:09:05 EDT Ventricular Rate:  96 PR Interval:    QRS Duration: 130 QT Interval:  384 QTC Calculation: 486 R Axis:   -62 Text Interpretation: Sinus rhythm Ventricular trigeminy Left bundle branch block Confirmed by Milton Ferguson [phone]) on 02/28/2019 9:15:21 AM   Radiology Dg Chest Port 1 View  Result Date: 02/28/2019 CLINICAL DATA:  Shortness of breath EXAM: PORTABLE CHEST 1 VIEW COMPARISON:  07/28/2017 FINDINGS: Prior median sternotomy. Stable cardiomediastinal contours. Patchy right upper lobe airspace opacity. Mildly increased interstitial markings bilaterally. No pleural effusion or pneumothorax. IMPRESSION: Patchy right upper lobe airspace opacity suspicious for pneumonia. Electronically Signed   By: Davina Poke M.D.   On: 02/28/2019 09:35    Procedures Procedures (including critical care time)  Medications Ordered in ED Medications  levofloxacin (LEVAQUIN) IVPB 500 mg (500 mg Intravenous New Bag/Given 02/28/19 1130)  sodium chloride 0.9 % bolus 500 mL (0 mLs Intravenous Stopped 02/28/19 1050)  oxyCODONE-acetaminophen (PERCOCET/ROXICET) 5-325 MG per tablet 1 tablet (1 tablet Oral Given 02/28/19 1004)  sodium chloride 0.9 % bolus 500 mL (0 mLs Intravenous Stopped 02/28/19 1219)     Initial Impression / Assessment and Plan / ED Course  I  have reviewed the triage vital signs and the nursing notes.  Pertinent labs & imaging results that were available during my care of the patient were reviewed by me and considered in my medical decision making (see chart for details).        CRITICAL CARE Performed by: Milton Ferguson Total critical care time: 40 minutes Critical care time was exclusive of separately billable procedures and treating other patients. Critical care was necessary to treat or prevent imminent or life-threatening deterioration. Critical care was time spent personally by me on the following activities: development of treatment plan with patient and/or surrogate as well as nursing, discussions with consultants, evaluation of patient's response to treatment, examination of patient, obtaining history from patient or surrogate, ordering and performing treatments and interventions, ordering and review of laboratory studies, ordering and review of radiographic studies, pulse oximetry and re-evaluation of patient's condition. Patient with elevated troponin and ST depression.  Cardiology has been consulted and will see the patient.  Patient will be admitted to family practice.  Patient's blood pressure responded to some bolus of saline.  He was also placed on some antibiotics because of possible pneumonia on x-ray  Final Clinical Impressions(s) / ED Diagnoses   Final diagnoses:  SOB (shortness of breath)    ED Discharge Orders    None       Milton Ferguson, MD 02/28/19 1230

## 2019-02-28 NOTE — ED Notes (Signed)
RN paged cardiology and notified elevated Troponin and increased SOB. IV fluids discontinued and IV Lasixs given. Calling admitting.

## 2019-02-28 NOTE — ED Notes (Signed)
Admitting at bedside 

## 2019-02-28 NOTE — Telephone Encounter (Signed)
Wife called and once I answered she stated she should not have made the 8am appt and she had to call the ambulance just now for [place] and he can't come to the appt with [place] at 915am, advised that I can cancel our appt and she said the ambulance is leaving now with Jenny Reichmann. I stated I'm sorry to hear and call back once she has any updates and she stated okay. Advised her to be safe since the weather is bad and to be safe since she plans to go to the hospital.

## 2019-02-28 NOTE — ED Notes (Signed)
Date and time results received: 02/28/19 "7:15 PM   Test: Lactic Acid Critical Value: 2.1  Name of Provider Notified: Delo, MD  Orders Received? Or Actions Taken?: None at this time

## 2019-02-28 NOTE — ED Notes (Signed)
EDP aware of elevated troponin level.

## 2019-02-28 NOTE — Progress Notes (Signed)
FPTS Interim Progress Note  S:Recevied page the patient continued to have increased work of breathing, with increased oxygen requirement, and diminished lung sounds diffusely bilaterally.  Reported to bedside with Dr. Enid Derry.  Patient reports that she continues to have shortness of breath, as well as back pain.  Patient reports that his shortness of breath has not improved since arrival to the ED.  Notified patient's nurse that patient is to be transitioned to heparin drip once INR has decreased to < 2.5.  Patient on warfarin chronically for mechanical valve.  O: BP (!) 143/74   Pulse 92   Temp 97.7 F (36.5 C) (Oral)   Resp (!) 23   Ht 5\' 6"  (1.676 m)   Wt 79.4 kg   SpO2 91%   BMI 28.25 kg/m    Physical Exam General: Elderly male in respiratory distress sitting up in bed with nasal cannula in place Pulmonary: Patient with bilateral drastically diminished breath sounds, patient with increased work of breathing, unable to speak in full sentences, gasping every 2-3 words, patient on 5.5 L O2  A/P: Patient with increased respiratory effort and desaturations into the 80s with minimal exertion during exam is concerning for worsening respiratory status.  -Respiratory therapy to see -Discussed advancing to BiPAP in details of would BiPAP encompasses with patient, patient agreeable to this plan -Advance to BiPAP -Patient still awaiting to be transitioned to heparin drip once INR is within goal range, <2.5  Stark Klein, MD 02/28/2019, 8:20 PM PGY-1, [facility] pager [phone]

## 2019-02-28 NOTE — H&P (Signed)
[facility] Admission History and Physical Service Pager: [phone]  Patient name: Juan Li Medical record number: [id number] Date of birth: November 07, 1931 Age: 85 y.o. Gender: male  Primary Care Provider: Zenia Resides, MD Consultants: Cardiology Code Status: DNI Preferred Emergency Contact: Spouse, [name], [phone]  Chief Complaint: SOB, generalized fatigue  Assessment and Plan: Juan Li is a 85 y.o. male presenting with progressive generalized fatigue and shortness of breath, found to have infiltrate on x-ray concerning for pneumonia and elevated troponin. PMH is significant for hyperlipidemia, dementia, hypertension, heart failure with preserved ejection fraction, s/p aortic valve replacement on chronic anticoagulation with warfarin, chronic neck pain, CAD/PVD.   Dyspnea with generalized weakness: Acute, improving. 1 week h/o progressive generalized weakness/fatigue and sudden dyspnea occurring this morning, in setting of recent family vacation.  Symptomatology already improving while in the ED after receiving oxygen.  Afebrile and hemodynamically stable, however requiring 2L [place] to maintain oxygen saturations above 90% and received fluids due to lower blood pressure which responded well.  Unlabored breathing on exam, mild rhonchi in upper lobe.  Leukocytosis of 77.9 with neutrophilic shift, troponin 3903, CR 2.1.  CXR showing opacity in URL suspicious for pneumonia.  EKG sinus with LBBB (seen in previous EKGs) and 1-68m ST depression in V4/5/6.  Differential remains broad and may be multifactorial including considerations for CAP, possible NSTEMI, Covid.  While his presentation is atypical for pneumonia without any concurrent fever or coughing, given opacity on CXR, leukocytosis, and generalized fatigue, believe it is reasonable to treat for CAP especially in older population can present atypically.  Also, interestingly symptoms started shortly  after recent vacation and with significant troponin elevation, must continue to consider COVID on differential.  As discussed below, could consider cardiac etiology for sudden onset dyspnea especially with known CAD.  Low concern for PE given symptomatology and supratherapeutic INR.  Lastly, given history of HFpEF, considered heart failure exacerbation, however clinically appears euvolemic. -Attending Dr. MWendy Poet admit to cardiac telemetry -Cardiology consulted, appreciate recommendations -Levaquin started in ED, will continue -Follow-up blood and urine culture, lactic acid -Follow-up Covid result -Procalcitonin -Oxygen therapy to maintain sats > 90% -Vitals per routine, pulse ox -CBC in a.m.  Troponin elevation: Acute. Troponin [id number] LBBB on EKG, cannot necessarily qualify ST depressions in V4/5/6 given LBBB. No current chest pains, however this morning had progressive dyspnea that has already improved with oxygen therapy.  Cardiology consulted in the ED, will appreciate recommendations and assistance.  Will continue to consider possible NSTEMI, demand ischemia in the setting of infection, vs Covid given symptomatology above. -Cardiology consulted, appreciate recommendations -Trend troponin  -EKG if change in clinical status, any chest pain -Monitor on cardiac telemetry   Mechanical aortic valve on chronic anticoagulation:  Follows with heart care on a regular basis.  Takes warfarin 5 mg daily. Notably INR 4.3, reassuringly with no signs of bleeding.  Hemoglobin at baseline. -Consult to pharmacy for warfarin dosing/management  Lower blood pressure in setting of known hypertension: Improving.  SBP in 90s on arrival, responded well to fluid resuscitation.  Known history of hypertension, takes amlodipine 5 mg at home for control.  See above for further discussion. -Monitor BP -Light fluid resuscitation with 100 mL/hour NS following 1L total bolus -Hold home amlodipine  AKI on CKD  stage III: Acute. CR 2.1, with baseline around 1.5.  Suspect in the setting of acute infection. -Light hydration with NS 100 mL/hour x12 hours  -Monitor BMP  HFpEF  PVD  CAD: Stable. Echo in 10/2018 showing EF of 60-65% with impaired relaxation.  Known carotid artery stenosis with CAD/PVD, followed closely by heart care outpatient. -Monitor BP, may restart Norvasc as appropriate -Continue Pravachol -Warfarin per pharmacy as discussed above  Chronic neck pain: Stable. Continues to experience nonradiating neck pain on a chronic basis.  Dementia: Chronic, stable. Lives with his wife as primary caretaker.  Alert and oriented on exam, reasonable historian.  Takes Namenda daily. -Delirium precautions -Continue home Namenda -Will touch base with wife to clarify any additional history  Hyperlipidemia: Chronic, stable. Takes pravastatin 40 mg.  -Continue Pravachol  BPH: Stable. Takes Flomax and Proscar at home for control.  Will hold for now given lower blood pressures, however may restart during hospitalization as appropriate.  Normocytic anemia: Chronic, stable. Hemoglobin 12.6, at baseline.  May be in the setting of chronic disease.  INR currently supratherapeutic, will continue to monitor for signs of bleeding. -Monitor CBC  FEN/GI: N.p.o. pending cardiology evaluation, IV NS at 100 mL/hour Prophylaxis: On warfarin with supratherapeutic INR  Disposition: Admit to cardiac telemetry  History of Present Illness:  Juan Li is a 85 y.o. male presenting with shortness of breath and generalized weakness/fatigue.  Says he first noted very mild symptoms of feeling generally weak about a week ago, shortly after [place] the beach for [place] with [place].  Over the course of this week he has felt progressively fatigued and weak, having difficulty dressing himself and doing his normal activities.  His wife is had to help him do these.  More tired than usual. This morning  shortly after woke up he felt significantly short of breath, felt he could not get air in.  He denies any focal weakness, numbness, slurring of speech, facial droop, chest pains, palpitations, recent cough, sputum production, lightheadedness/dizziness, chills, or fever.  Says they generally stayed in the hospital there at the beach and he has no known Covid exposures.  No one else in his family has felt sick.  At baseline, walks with a walker.  Denies any falls with his feeling of more weakness/wobbly.  Denies any orthopnea, PND, lower leg swelling.  Still has been eating and drinking well.  Lives with his wife, who is not present during this evaluation.  Also complains of neck pain which is chronic in nature, denies any worsening of this recently, no radiation.  Says he is already feeling better after receiving " something through the facemask "while he was in EMS.  He cannot remember if he received nitro.  Says his shortness of breath is no longer present during our conversation and not feeling weak.  ED course: On arrival, he was afebrile and hemodynamically stable, however with lower blood pressure of 95/63 and satting around 94% on room air.  He was started on oxygen via [place] due to desaturations into the upper 80s.  Additionally given two 500 mL NS boluses due to lower blood pressures, BP responded well.  Labs significant for creatinine of 2.1, glucose 196, CO2 15, bilirubin 2.7, WBC 13.4, hemoglobin 12.6, and troponin of 8649.  CXR showing patchy right upper lobe airspace opacity suspicious for pneumonia.  EKG showing LBBB with ST depressions in lateral leads, due to this in association with elevated troponin, cardiology was consulted while in the ED and will be evaluating patient.  Levaquin was started for concerns for pneumonia.    Review Of Systems: Per HPI with the following additions:   Review of Systems  Constitutional: Positive for malaise/fatigue. Negative for chills, diaphoresis and fever.   Eyes: Negative for blurred vision and double vision.  Respiratory: Positive for shortness of breath. Negative for cough, sputum production and wheezing.   Cardiovascular: Negative for chest pain, palpitations, orthopnea, claudication, leg swelling and PND.  Gastrointestinal: Negative for heartburn, nausea and vomiting.  Genitourinary: Negative for dysuria.  Musculoskeletal: Positive for neck pain. Negative for falls.  Skin: Negative for rash.  Neurological: Positive for weakness. Negative for dizziness, focal weakness, loss of consciousness and headaches.    Patient Active Problem List   Diagnosis Date Noted  . Diarrhea 09/27/2017  . S/P AVR (aortic valve replacement)   . Hx of excision of lamina of cervical vertebra for decompression of spinal cord 08/30/2017  . Elevated INR   . Cervical myelopathy ([place]) 07/11/2017  . (HFpEF) heart failure with preserved ejection fraction ([place]) 06/24/2017  . Peripheral vascular disease ([place]) 12/16/2016  . Acute gout involving toe of left foot 12/16/2016  . Cervical spondylosis with myelopathy 01/28/2016  . Encounter for palliative care 11/21/2014  . Carotid artery disease ([place]) 07/18/2014  . Stenosis of cervical spine region 07/18/2014  . Bradycardia 10/06/2013  . Unspecified vitamin D deficiency 11/30/2012  . Chronic diastolic CHF (congestive heart failure) ([place]) 08/21/2012  . Gait abnormality 09/02/2011  . Chronic anticoagulation 06/28/2010  . AAA 01/20/2010  . HEART VALVE REPLACEMENT, HX OF 12/31/2008  . CKD (chronic kidney disease) stage 3, GFR 30-59 ml/min 08/06/2008  . Dementia ([place]) 05/25/2007  . OSTEOARTHROSIS, GENERALIZED, MULTIPLE SITES 09/18/2006  . HYPERCHOLESTEROLEMIA 06/29/2006  . Gouty arthropathy 06/29/2006  . CARPAL TUNNEL SYNDROME 06/29/2006  . Essential hypertension 06/29/2006  . GASTROESOPHAGEAL REFLUX, NO ESOPHAGITIS 06/29/2006  . BPH (benign prostatic hyperplasia) 06/29/2006  . Skin cancer 06/29/2006  . BACK PAIN, LOW  06/29/2006  . ROTATOR CUFF TENDONITIS 06/29/2006  . TOBACCO USE, QUIT 06/29/2006    Past Medical History: Past Medical History:  Diagnosis Date  . AKI (acute kidney injury) ([place]) 07/29/2017  . Arthritis 02-15-12   osteoarthritis,right knee, pain right wrist.,  . Arthritis    "hands, legs" (12/01/2016)  . Carotid artery disease ([place])    Carotid US 1/18: R 40-59; L 80-99  . Chronic lower back pain   . Complication of anesthesia 02-15-12   very confused, and very slow to awaken after anesthesia  . Coronary artery disease 02-15-12   Heart valve replaced   . Dementia ([place])   . Depression   . Hypercholesterolemia 02-15-12  . Hypertension   . Memory loss   . PAD (peripheral artery disease) ([place])   . S/P AVR (aortic valve replacement)    Mechanical St. Jude AVR // Echo 12/17: Moderate LVH, EF 60-65, normal wall motion, grade 1 diastolic dysfunction, mechanical AVR okay with mean gradient 8 mmHg, MAC, mild mitral stenosis (mean gradient 2 mmHg), mild MR, mild RAE, PASP 25  . Urinary frequency     Past Surgical History: Past Surgical History:  Procedure Laterality Date  . ABDOMINAL AORTAGRAM  12/01/2016  . ABDOMINAL AORTOGRAM N/A 12/01/2016   Procedure: ABDOMINAL AORTOGRAM;  Surgeon: Nelva Bush, MD;  Location: [facility];  Service: Cardiovascular;  Laterality: N/A;  . ANTERIOR CERVICAL DECOMP/DISCECTOMY FUSION       Decompression at the level of C3-C4 and C4-C5, allograft and plate from C3 to C%, microscope/notes 09/14/2010  . ANTERIOR CERVICAL DECOMP/DISCECTOMY FUSION N/A 07/11/2017   Procedure: CERVICAL FIVE-SIX ANTERIOR CERVICAL DECOMPRESSION/DISCECTOMY FUSION AND PLATE FIXATION;  Surgeon: Consuella Lose,  MD;  Location: [facility];  Service: Neurosurgery;  Laterality: N/A;  CERVICAL FIVE-SIX ANTERIOR CERVICAL DECOMPRESSION/DISCECTOMY FUSION AND PLATE FIXATION  . BACK SURGERY    . CARDIAC VALVE REPLACEMENT    . CARPAL TUNNEL RELEASE Left 01/2003   Archie Endo 09/14/2010  . JOINT  REPLACEMENT    . LOWER EXTREMITY ANGIOGRAPHY  12/01/2016  . LOWER EXTREMITY ANGIOGRAPHY Bilateral 12/01/2016   Procedure: Lower Extremity Angiography;  Surgeon: Nelva Bush, MD;  Location: [facility];  Service: Cardiovascular;  Laterality: Bilateral;  . MECHANICAL AORTIC VALVE REPLACEMENT  1993   Archie Endo 11/18/2016  . SHOULDER OPEN ROTATOR CUFF REPAIR Left 02/15/2012  . TOTAL KNEE ARTHROPLASTY  02/20/2012   Procedure: TOTAL KNEE ARTHROPLASTY;  Surgeon: Gearlean Alf, MD;  Location: [facility];  Service: Orthopedics;  Laterality: Right;  . TRANSURETHRAL RESECTION OF PROSTATE  02/01/2010   Archie Endo 02/06/2010    Social History: Social History   Tobacco Use  . Smoking status: Former Smoker    Packs/day: 1.00    Years: 50.00    Pack years: 50.00    Types: Cigarettes    Quit date: 08/10/1995    Years since quitting: 23.5  . Smokeless tobacco: Never Used  Substance Use Topics  . Alcohol use: No    Alcohol/week: 0.0 standard drinks    Frequency: Never    Comment: 12/01/2016 "nothing in the 2000s"  . Drug use: No   Additional social history: Former smoker, does not drink alcohol.  Lives with his wife. Please also refer to relevant sections of EMR.  Family History: Family History  Problem Relation Age of Onset  . Heart disease Mother   . Hyperlipidemia Mother   . Hypertension Mother   . Varicose Veins Mother   . Hyperlipidemia Father   . Hypertension Father   . Heart attack Father     Allergies and Medications: No Known Allergies No current facility-administered medications on file prior to encounter.    Current Outpatient Medications on File Prior to Encounter  Medication Sig Dispense Refill  . amLODipine (NORVASC) 5 MG tablet Take 1 tablet (5 mg total) by mouth daily. 90 tablet 3  . buPROPion (WELLBUTRIN) 75 MG tablet TAKE 1 TABLET BY MOUTH EVERY DAY 90 tablet 3  . finasteride (PROSCAR) 5 MG tablet TAKE 1 TABLET (5 MG TOTAL) BY MOUTH DAILY. 90 tablet 3  . memantine  (NAMENDA) 5 MG tablet TAKE 1 TABLET BY MOUTH TWICE A DAY 180 tablet 3  . mirabegron ER (MYRBETRIQ) 25 MG TB24 tablet Take 1 tablet (25 mg total) by mouth daily. 30 tablet   . pravastatin (PRAVACHOL) 40 MG tablet TAKE 1 TABLET BY MOUTH EVERY DAY IN THE EVENING 90 tablet 3  . tamsulosin (FLOMAX) 0.4 MG CAPS capsule TAKE ONE CAPSULE BY MOUTH EVERY DAY 90 capsule 3  . warfarin (COUMADIN) 5 MG tablet TAKE 1 TABLET AS DIRECTED 90 tablet 4    Objective: BP 96/65   Pulse 88   Temp 97.7 F (36.5 C) (Oral)   Resp 20   Ht _0  (1.676 m)   Wt 79.4 kg   SpO2 91%   BMI 28.25 kg/m  Exam:  General: Alert, NAD, pleasant older gentleman HEENT: NCAT, MMM, oropharynx nonerythematous, no JVD seen Cardiac: Regular rate and rhythm with loud S2.  No rubs or gallops.  No appreciable murmur. Lungs: Unlabored breathing able to speak in full sentences.  Lungs clear bilaterally with the exception of mild rhonchi in right upper lung field posteriorly.  Satting low 90s on 2L nasal cannula. Abdomen: soft, non-tender, non-distended, normoactive BS Msk: Moves all extremities spontaneously  Neuro: Alert and oriented to person, place, situation.  No focal deficits noted on exam with 5/5 upper and lower extremity strength.  EOMI, PERRLA.  Speech intelligible and able to follow commands without concern. Ext: Warm, dry, 2+ distal pulses, no edema  Derm: No excessive bruising or rashes noted    Labs and Imaging: CBC BMET  Recent Labs  Lab 02/28/19 0920  WBC 13.4*  HGB 12.6*  HCT 38.5*  PLT 205   Recent Labs  Lab 02/28/19 0920  NA 138  K 4.6  CL 111  CO2 15*  BUN 37*  CREATININE 2.10*  GLUCOSE 196*  CALCIUM 8.9     EKG: Sinus rhythm Left bundle branch block   Dg Chest Port 1 View  Result Date: 02/28/2019 CLINICAL DATA:  Shortness of breath EXAM: PORTABLE CHEST 1 VIEW COMPARISON:  07/28/2017 FINDINGS: Prior median sternotomy. Stable cardiomediastinal contours. Patchy right upper lobe airspace  opacity. Mildly increased interstitial markings bilaterally. No pleural effusion or pneumothorax. IMPRESSION: Patchy right upper lobe airspace opacity suspicious for pneumonia. Electronically Signed   By: Davina Poke M.D.   On: 02/28/2019 09:35    Patriciaann Clan, DO 02/28/2019, 11:45 AM PGY-2, [place] Intern pager: [phone], text pages welcome

## 2019-02-28 NOTE — ED Notes (Signed)
Spoke with admitting MD, notified about patients increased work of breathing. MD stated she would come see the patient.

## 2019-02-28 NOTE — Progress Notes (Signed)
  Echocardiogram 2D Echocardiogram has been performed.  Juan Li 02/28/2019, 5:01 PM

## 2019-02-28 NOTE — Progress Notes (Addendum)
ANTICOAGULATION CONSULT NOTE - Initial Consult  Pharmacy Consult for Warfarin and Heparin Indication: Mechanical Valve & ACS/Chest Pain  No Known Allergies  Patient Measurements: Height: _0  (167.6 cm) Weight: 175 lb (79.4 kg) IBW/kg (Calculated) : 63.8   Vital Signs: Temp: 97.7 F (36.5 C) (10/29 0912) Temp Source: Oral (10/29 0912) BP: 108/74 (10/29 1400) Pulse Rate: 83 (10/29 1400)  Labs: Recent Labs    02/28/19 0920  HGB 12.6*  HCT 38.5*  PLT 205  LABPROT 40.9*  INR 4.3*  CREATININE 2.10*  TROPONINIHS 8,649*    Estimated Creatinine Clearance: 25.5 mL/min (A) (by C-G formula based on SCr of 2.1 mg/dL (H)).   Medical History: Past Medical History:  Diagnosis Date  . AKI (acute kidney injury) ([place]) 07/29/2017  . Arthritis 02-15-12   osteoarthritis,right knee, pain right wrist.,  . Arthritis    "hands, legs" (12/01/2016)  . Carotid artery disease ([place])    Carotid US 1/18: R 40-59; L 80-99  . Chronic lower back pain   . Complication of anesthesia 02-15-12   very confused, and very slow to awaken after anesthesia  . Coronary artery disease 02-15-12   Heart valve replaced   . Dementia ([place])   . Depression   . Hypercholesterolemia 02-15-12  . Hypertension   . Memory loss   . PAD (peripheral artery disease) ([place])   . S/P AVR (aortic valve replacement)    Mechanical St. Jude AVR // Echo 12/17: Moderate LVH, EF 60-65, normal wall motion, grade 1 diastolic dysfunction, mechanical AVR okay with mean gradient 8 mmHg, MAC, mild mitral stenosis (mean gradient 2 mmHg), mild MR, mild RAE, PASP 25  . Urinary frequency     Medications:  Scheduled:  . [START ON 03/01/2019] buPROPion  75 mg Oral Daily  . [START ON 03/01/2019] pravastatin  40 mg Oral q1800   Infusions:  . sodium chloride 100 mL/hr at 02/28/19 1440    Assessment: Pt is a 85 y/o male who presents with PMH of HLD, dementia, HTN, Heart failure with preserved ejection fraction, s/p aortic valve  replacement on chronic anticoagulation who presents with progressive generalized fatigue and shortness of breath with infiltrates on x-ray. Furthermore, patient also has elevated troponin. Pharmacy has been consulted to dose warfarin.  Pt's Pt-INR today is 4.3. Hg/Hct are wnls at 12.6/38.5. Plts are wnls. No signs/symptoms of bleeding. Troponins are elevated at 8,649.  Pt's home warfarin dose: 5 mg daily, except 7.5 mg on MWF per outpatient anticoagulation records.  Goal of Therapy:  INR 2.5-3.5 Monitor platelets by anticoagulation protocol: Yes   Plan:  Hold warfarin today Monitor daily Pt-INR Watch for signs/symptoms of bleeding Monitor CBC periodically Per Cardiology, start heparin when INR < 2.5  Sherren Kerns, PharmD PGY1 [place] Resident 02/28/2019,2:56 PM

## 2019-02-28 NOTE — Consult Note (Addendum)
Cardiology Consultation:   Patient ID: Juan Li MRN: [id number]; DOB: [birth year]  Admit date: 02/28/2019 Date of Consult: 02/28/2019  Primary Care Provider: Zenia Resides, MD Primary Cardiologist: Juan Bush, MD  Primary Electrophysiologist:  None    Patient Profile:   Juan Li is a 85 y.o. male with a hx of hyperlipidemia, hypertension, dementia, Chronic diastolic Heart Failure, s/p aortic valve replacement on chronic warfarin, chronic neck pain, CKD stage III, Carotid artery stenosis, PAD who is being seen today for the evaluation of chest pain at the request of Juan Li.  History of Present Illness:   Juan Li follow with Juan Li for the above cardiac problems. History includes Aortic stenosis s/p mechanical aortic valve replacement in 1993. He is on coumadin for this and followed by the coumadin clinic. He also has a history of Heart failure with preserved EF. In 2018 he was seen by Juan Li for a carotid bruit and was found to have 80% stenosis on the left and 40-59% on the right. He was asymptomatic at that time and medical management was pursued. He also has history of PAD with a slow healing right calf wound and underwent angiography in 2018 with Juan Li showing 70% right ostial common iliac artery stenosis and 80% left popliteal artery stenosis. Medical therapy was recommended at that time with wound clinic follow-up. Patient had a nuclear stress testin 2018 as part of preop eval for cervical spine surgery that was low risk and negative for ischemia. His last carotid dopplers were in February 2020 and showed 60-70% Left ICA stenosis. It was recommended he get a f/u scan in 6 months. Last echo was 2020 showing EF 60-65%,G1DD, s/p bileafet AVR without stenosis or regurgitation. The patient was last seen in the office 09/27/18 for follow-up for aortic valve and carotid artery stenosis and no changes were made at that time.   The patient presented  to the ED 02/28/19 for shortness of breath and generalized weakness. He also was complaining of chronic neck pain. Patient says SOB has been getting worse over the last week and noticed this morning when he woke up he was short of breath at rest. He says he has felt progressively weaker over the last month. Before a month ago he was able to care for himself, but he has just not had the energy lately. This morning he was so weak he couldn't get out of bed. Denies chest pain, palpitations, or lower leg swelling.    In the ED BP 92/74, pulse 91, afebrile, RR 17. Labs revealed potassium 4.6, glucose 196, creatinine 2.10, BUN 37. AST 50, ALT 20. WBC 13.4, Hgb 12.6. He was started on 3L O2 and given 1L NS bolus for hypotension.  HS troponin 8,649. EKG showed ST depression V3-V6 and LBBB (QRS 128m). Prior EKG shows incomplete LBBB QRS 114 ms. CXR showed possible PNA. COVID pending. He was started on abx and admitted.   Patient was a smoker about 40 years ago. No alcohol or drug use. No family history of heart disease. On my exam he seems short of breath on 2L O2. No chest pain. Denies any sick contacts.   Heart Pathway Score:     Past Medical History:  Diagnosis Date  . [place]) [place] 07[place]  . Arthritis 02-15-12   osteoarthritis,right knee, pain right wrist.,  . Arthritis    "hands, legs" (12/01/2016)  . Carotid artery disease [place]    Carotid [place]: R 40-59;  L 80-99  . Chronic lower back pain   . Complication of anesthesia 02-15-12   very confused, and very slow to awaken after anesthesia  . Coronary artery disease 02-15-12   Heart valve replaced   . Dementia ([place])   . Depression   . Hypercholesterolemia 02-15-12  . Hypertension   . Memory loss   . PAD (peripheral artery disease) ([place])   . S/P AVR (aortic valve replacement)    Mechanical St. Jude AVR // Echo 12/17: Moderate LVH, EF 60-65, normal wall motion, grade 1 diastolic dysfunction, mechanical AVR okay with mean  gradient 8 mmHg, MAC, mild mitral stenosis (mean gradient 2 mmHg), mild MR, mild RAE, PASP 25  . Urinary frequency     Past Surgical History:  Procedure Laterality Date  . ABDOMINAL AORTAGRAM  12/01/2016  . ABDOMINAL AORTOGRAM N/A 12/01/2016   Procedure: ABDOMINAL AORTOGRAM;  Surgeon: Juan Bush, MD;  Location: [facility];  Service: Cardiovascular;  Laterality: N/A;  . ANTERIOR CERVICAL DECOMP/DISCECTOMY FUSION       Decompression at the level of C3-C4 and C4-C5, allograft and plate from C3 to C%, microscope/notes 09/14/2010  . ANTERIOR CERVICAL DECOMP/DISCECTOMY FUSION N/A 07/11/2017   Procedure: CERVICAL FIVE-SIX ANTERIOR CERVICAL DECOMPRESSION/DISCECTOMY FUSION AND PLATE FIXATION;  Surgeon: Juan Lose, MD;  Location: [facility];  Service: Neurosurgery;  Laterality: N/A;  CERVICAL FIVE-SIX ANTERIOR CERVICAL DECOMPRESSION/DISCECTOMY FUSION AND PLATE FIXATION  . BACK SURGERY    . CARDIAC VALVE REPLACEMENT    . CARPAL TUNNEL RELEASE Left 01/2003   Juan Li 09/14/2010  . JOINT REPLACEMENT    . LOWER EXTREMITY ANGIOGRAPHY  12/01/2016  . LOWER EXTREMITY ANGIOGRAPHY Bilateral 12/01/2016   Procedure: Lower Extremity Angiography;  Surgeon: Juan Bush, MD;  Location: [facility];  Service: Cardiovascular;  Laterality: Bilateral;  . MECHANICAL AORTIC VALVE REPLACEMENT  1993   Juan Li 11/18/2016  . SHOULDER OPEN ROTATOR CUFF REPAIR Left 02/15/2012  . TOTAL KNEE ARTHROPLASTY  02/20/2012   Procedure: TOTAL KNEE ARTHROPLASTY;  Surgeon: Juan Alf, MD;  Location: [facility];  Service: Orthopedics;  Laterality: Right;  . TRANSURETHRAL RESECTION OF PROSTATE  02/01/2010   Juan Li 02/06/2010     Home Medications:  Prior to Admission medications   Medication Sig Start Date End Date Taking? Authorizing Provider  acetaminophen (TYLENOL) 500 MG tablet Take 1,000 mg by mouth every 6 (six) hours as needed for mild pain.   Yes [provider]  amLODipine (NORVASC) 5 MG tablet Take  1 tablet (5 mg total) by mouth daily. 12/11/18  Yes Li, Juan Collin, MD  buPROPion (WELLBUTRIN) 75 MG tablet TAKE 1 TABLET BY MOUTH EVERY DAY Patient taking differently: Take 75 mg by mouth daily.  08/13/18  Yes Li, Juan Collin, MD  finasteride (PROSCAR) 5 MG tablet TAKE 1 TABLET (5 MG TOTAL) BY MOUTH DAILY. Patient taking differently: Take 5 mg by mouth daily.  08/20/18  Yes Li, Juan Collin, MD  memantine (NAMENDA) 5 MG tablet TAKE 1 TABLET BY MOUTH TWICE A DAY Patient taking differently: Take 5 mg by mouth 2 (two) times daily.  10/26/18  Yes Li, Juan Collin, MD  Menthol-Methyl Salicylate (MUSCLE RUB) 10-15 % CREA Apply 1 application topically as needed for muscle pain.   Yes [provider]  mirabegron ER (MYRBETRIQ) 25 MG TB24 tablet Take 1 tablet (25 mg total) by mouth daily. 01/18/19  Yes Li, Juan Collin, MD  pravastatin (PRAVACHOL) 40 MG tablet TAKE 1 TABLET BY MOUTH EVERY DAY IN THE  EVENING Patient taking differently: Take 40 mg by mouth daily.  10/26/18  Yes Li, Juan Collin, MD  tamsulosin (FLOMAX) 0.4 MG CAPS capsule TAKE ONE CAPSULE BY MOUTH EVERY DAY Patient taking differently: Take 0.4 mg by mouth daily.  07/21/18  Yes Li, Juan Collin, MD  warfarin (COUMADIN) 5 MG tablet TAKE 1 TABLET AS DIRECTED Patient taking differently: Take 5 mg by mouth daily at 6 PM.  07/21/18  Yes Li, Juan Collin, MD    Inpatient Medications: Scheduled Meds: . [START ON 03/01/2019] buPROPion  75 mg Oral Daily  . [START ON 03/01/2019] pravastatin  40 mg Oral q1800   Continuous Infusions:  PRN Meds: acetaminophen **OR** acetaminophen  Allergies:   No Known Allergies  Social History:   Social History   Socioeconomic History  . Marital status: Married    Spouse name: [name]  . Number of children: 3  . Years of education: Not on file  . Highest education level: Not on file  Occupational History  . 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 Financial resource strain: Not on file  . Food insecurity    Worry: Not on file    Inability: Not on file  . Transportation needs    Medical: Not on file    Non-medical: Not on file  Tobacco Use  . Smoking status: Former Smoker    Packs/day: 1.00    Years: 50.00    Pack years: 50.00    Types: Cigarettes    Quit date: 08/10/1995    Years since quitting: 23.5  . Smokeless tobacco: Never Used  Substance and Sexual Activity  . Alcohol use: No    Alcohol/week: 0.0 standard drinks    Frequency: Never    Comment: 12/01/2016 "nothing in the 2000s"  . Drug use: No  . Sexual activity: Not on file  Lifestyle  . Physical activity    Days per week: Not on file    Minutes per session: Not on file  . Stress: Not on file  Relationships  . Social Herbalist on phone: Not on file    Gets together: Not on file    Attends religious service: Not on file    Active member of club or organization: Not on file    Attends meetings of clubs or organizations: Not on file    Relationship status: Not on file  . Intimate partner violence    Fear of current or ex partner: Not on file    Emotionally abused: Not on file    Physically abused: Not on file    Forced sexual activity: Not on file  Other Topics Concern  . Not on file  Social History Narrative   Health Care POA: Wife, [place]   Emergency Contact: wife, [place], [phone] (c)   End of Life Plan:   Who lives with you: Patient lives with wife.   Any pets:    Diet: Patient has a varied diet of protein, starch, and vegetables.   Exercise: Patient does not have a regular exercise plan, afraid of falling.   Seatbelts: Patient reports wearing seatbelt when in vehicle.    Sun Exposure/Protection: Patient does not wear sun protection.   Hobbies: Patient likes to do yard work.          Family History:   Family History  Problem Relation Age of Onset  . Heart disease Mother   . Hyperlipidemia Mother   .  Hypertension Mother   . Varicose Veins Mother   . Hyperlipidemia Father   . Hypertension Father   . Heart attack Father      ROS:  Please see the history of present illness.  All other ROS reviewed and negative.     Physical Exam/Data:   Vitals:   02/28/19 1200 02/28/19 1215 02/28/19 1230 02/28/19 1245  BP: 103/71 92/74 (!) 108/59 103/63  Pulse: 81 91 90 84  Resp: (!) 25 17 (!) 25 20  Temp:      TempSrc:      SpO2: 91% 92% 92% (!) 88%  Weight:      Height:        Intake/Output Summary (Last 24 hours) at 02/28/2019 1321 Last data filed at 02/28/2019 1247 Gross per 24 hour  Intake 1100 ml  Output -  Net 1100 ml   Last 3 Weights 02/28/2019 01/16/2019 09/27/2018  Weight (lbs) 175 lb 176 lb 6.4 oz 175 lb  Weight (kg) 79.379 kg 80.015 kg 79.379 kg     Body mass index is 28.25 kg/m.  General:  Well nourished, well developed, in no acute distress HEENT: normal Lymph: no adenopathy Neck: no JVD Endocrine:  No thryomegaly Vascular: No carotid bruits; FA pulses 2+ bilaterally without bruits  Cardiac:  normal S1, S2; RRR; systolic murmur  Lungs:  clear to auscultation bilaterally, no wheezing, rhonchi or rales; 2L O2 Abd: soft, nontender, no hepatomegaly  Ext: no edema Musculoskeletal:  No deformities, BUE and BLE strength normal and equal Skin: warm and dry  Neuro:  CNs 2-12 intact, no focal abnormalities noted Psych:  Normal affect   EKG:  The EKG was personally reviewed and demonstrates:  NSR, LBBB (QRS 130 ms), ST depression V3-V6. EKG in 2019 show incomplete LBBB QRS 114 ms Telemetry:  Telemetry was personally reviewed and demonstrates:  NSR, HR 80-90s  Relevant CV Studies:  Echo 10/12/18  1. The left ventricle has normal systolic function with an ejection fraction of 60-65%. The cavity size was normal. There is mildly increased left ventricular wall thickness. Left ventricular diastolic Doppler parameters are consistent with impaired  relaxation. No evidence of left  ventricular regional wall motion abnormalities.  2. The right ventricle has normal systolic function. The cavity was normal. There is no increase in right ventricular wall thickness.  3. Left atrial size was mildly dilated.  4. The mitral valve is degenerative. Mild calcification of the mitral valve leaflet. There is moderate mitral annular calcification present. No evidence of mitral valve stenosis.  5. There is a mechanical aortic valve prosthesis. Mean gradient 8 mmHg, no significant prosthetic valve stenosis. No significant regurgitation.  6. The aortic root is normal in size and structure.  7. The IVC was normal in size. No complete TR doppler jet so unable to estimate PA systolic pressure.  Laboratory Data:  High Sensitivity Troponin:   Recent Labs  Lab 02/28/19 0920  TROPONINIHS 8,649*     Chemistry Recent Labs  Lab 02/28/19 0920  NA 138  K 4.6  CL 111  CO2 15*  GLUCOSE 196*  BUN 37*  CREATININE 2.10*  CALCIUM 8.9  GFRNONAA 28*  GFRAA 32*  ANIONGAP 12    Recent Labs  Lab 02/28/19 0920  PROT 6.8  ALBUMIN 3.7  AST 50*  ALT 20  ALKPHOS 51  BILITOT 2.7*   Hematology Recent Labs  Lab 02/28/19 0920  WBC 13.4*  RBC 3.90*  HGB 12.6*  HCT 38.5*  MCV 98.7  MCH 32.3  MCHC 32.7  RDW 14.6  PLT 205   BNPNo results for input(s): BNP, PROBNP in the last 168 hours.  DDimer No results for input(s): DDIMER in the last 168 hours.   Radiology/Studies:  Dg Chest Port 1 View  Result Date: 02/28/2019 CLINICAL DATA:  Shortness of breath EXAM: PORTABLE CHEST 1 VIEW COMPARISON:  07/28/2017 FINDINGS: Prior median sternotomy. Stable cardiomediastinal contours. Patchy right upper lobe airspace opacity. Mildly increased interstitial markings bilaterally. No pleural effusion or pneumothorax. IMPRESSION: Patchy right upper lobe airspace opacity suspicious for pneumonia. Electronically Signed   By: Davina Poke M.D.   On: 02/28/2019 09:35    Assessment and Plan:    Elevated Troponin/NSTEMI Patient presented with SOB admitted for PNA found to have signifigantly elevated troponin - HS troponin 8649>>Continue to trend - EKG shows new ST depression V3-V6 and LBBB QRS 130 ms. Previous EKG show incomplete LBBB QRS 114 ms - Patient denies chest pain at this time - No known history of CAD, but very likely with age and risk factors. - Aspirin 81 mg daily, pravastatin 40 mg home meds - Will get STAT echo - Will likely need a cath given troponin elevation and EKG changes. Would hold off at this point due to renal insufficiency. Plan to switch to heparin per pharmacy once INR drops below 2.5 Monitor creatinine  Pneumonia Patient presented with progressive shortness of breath requiring O2 in the ED. WBC is elevated. Patient is afebrile. CXR suspicious for PNA - COVID pending - on 2L O2 - IV abx per primary team  Mechanical Aortic Valve on warfarin - INR 4.3 on admission - No signs of bleeding - Plan to transition from coumadin to heparin per pharmacy once INR is lower than 2.5  Chronic diastolic Heart failure - Echo in 10/2018 showed EF 60-65% with impaired relaxation - No edema on exam. Do not suspect acute exacerbation at this time - BNP pending - Appears patient was on enalapril 12m and HCTZ 25 mg in the past. Unsure as to why and when it was stopped ? Patient is unsure of which medications he is currently taking.  -  Would be cautious with IVF at this time. Monitor volume closely.  AKI on CKD stage III Creatinine on admission 2.1 - Baseline around 1.5-1.6 - on IVF per Primary team - daily BMET - Avoid nephrotoxic agents  Hypertension On presentation pressures were low and she was given 1L NS - Pressures slowly improving - Home med amlodipine 548mheld on admission - Most recent pressure was 122/100  Hyperlipidemia - Pravastatin 40 mg home dose - LDL 114 in 2019. Will recheck  Dementia - stable  For questions or updates, please contact  [facility] consult [email or web address] for contact info under     Signed, Quantel Mcinturff H Ninfa MeekerPA-C  02/28/2019 1:21 PM

## 2019-02-28 NOTE — Progress Notes (Deleted)
Notified bedside nurse of need to draw lactic acid.  

## 2019-02-28 NOTE — ED Notes (Signed)
Dr. Geralyn Flash paged to [phone] Dr. Roderic Palau paged by Levada Dy

## 2019-02-28 NOTE — ED Notes (Signed)
Attempted to call report to [phone] 

## 2019-02-28 NOTE — Progress Notes (Signed)
Notified bedside nurse of need to draw lactic acid.  

## 2019-03-01 ENCOUNTER — Encounter (HOSPITAL_COMMUNITY): Payer: Self-pay | Admitting: Family Medicine

## 2019-03-01 DIAGNOSIS — Z7901 Long term (current) use of anticoagulants: Secondary | ICD-10-CM | POA: Diagnosis not present

## 2019-03-01 DIAGNOSIS — R0602 Shortness of breath: Secondary | ICD-10-CM | POA: Diagnosis not present

## 2019-03-01 DIAGNOSIS — I10 Essential (primary) hypertension: Secondary | ICD-10-CM

## 2019-03-01 DIAGNOSIS — J9601 Acute respiratory failure with hypoxia: Secondary | ICD-10-CM | POA: Diagnosis not present

## 2019-03-01 DIAGNOSIS — R778 Other specified abnormalities of plasma proteins: Secondary | ICD-10-CM | POA: Diagnosis not present

## 2019-03-01 DIAGNOSIS — I5043 Acute on chronic combined systolic (congestive) and diastolic (congestive) heart failure: Secondary | ICD-10-CM | POA: Diagnosis not present

## 2019-03-01 DIAGNOSIS — I5021 Acute systolic (congestive) heart failure: Secondary | ICD-10-CM | POA: Diagnosis not present

## 2019-03-01 DIAGNOSIS — I447 Left bundle-branch block, unspecified: Secondary | ICD-10-CM | POA: Diagnosis not present

## 2019-03-01 DIAGNOSIS — Z952 Presence of prosthetic heart valve: Secondary | ICD-10-CM | POA: Diagnosis not present

## 2019-03-01 LAB — TROPONIN I (HIGH SENSITIVITY)
Troponin I (High Sensitivity): 12000 ng/L (ref <18)
Troponin I (High Sensitivity): 11676 ng/L (ref <18)
Troponin I (High Sensitivity): 12126 ng/L (ref <18)
Troponin I (High Sensitivity): 9409 ng/L (ref <18)

## 2019-03-01 LAB — HEMOGLOBIN A1C
Hgb A1c MFr Bld: 5.6 % (ref 4.8–5.6)
Mean Plasma Glucose: 114.02 mg/dL

## 2019-03-01 LAB — COMPREHENSIVE METABOLIC PANEL
ALT: 20 U/L (ref 0–44)
AST: 43 U/L — ABNORMAL HIGH (ref 15–41)
Albumin: 3.3 g/dL — ABNORMAL LOW (ref 3.5–5.0)
Alkaline Phosphatase: 45 U/L (ref 38–126)
Anion gap: 13 (ref 5–15)
BUN: 42 mg/dL — ABNORMAL HIGH (ref 8–23)
CO2: 16 mmol/L — ABNORMAL LOW (ref 22–32)
Calcium: 8.8 mg/dL — ABNORMAL LOW (ref 8.9–10.3)
Chloride: 112 mmol/L — ABNORMAL HIGH (ref 98–111)
Creatinine, Ser: 2.13 mg/dL — ABNORMAL HIGH (ref 0.61–1.24)
GFR calc Af Amer: 32 mL/min — ABNORMAL LOW (ref >60)
GFR calc non Af Amer: 27 mL/min — ABNORMAL LOW (ref >60)
Glucose, Bld: 114 mg/dL — ABNORMAL HIGH (ref 70–99)
Potassium: 4.2 mmol/L (ref 3.5–5.1)
Sodium: 141 mmol/L (ref 135–145)
Total Bilirubin: 2.4 mg/dL — ABNORMAL HIGH (ref 0.3–1.2)
Total Protein: 6.5 g/dL (ref 6.5–8.1)

## 2019-03-01 LAB — LIPID PANEL
Cholesterol: 154 mg/dL (ref 0–200)
HDL: 41 mg/dL (ref >40)
LDL Cholesterol: 99 mg/dL (ref 0–99)
Total CHOL/HDL Ratio: 3.8 RATIO
Triglycerides: 72 mg/dL (ref <150)
VLDL: 14 mg/dL (ref 0–40)

## 2019-03-01 LAB — CBC WITH DIFFERENTIAL/PLATELET
Abs Immature Granulocytes: 0.09 10*3/uL — ABNORMAL HIGH (ref 0.00–0.07)
Basophils Absolute: 0 10*3/uL (ref 0.0–0.1)
Basophils Relative: 0 %
Eosinophils Absolute: 0 10*3/uL (ref 0.0–0.5)
Eosinophils Relative: 0 %
HCT: 36.3 % — ABNORMAL LOW (ref 39.0–52.0)
Hemoglobin: 12.1 g/dL — ABNORMAL LOW (ref 13.0–17.0)
Immature Granulocytes: 1 %
Lymphocytes Relative: 6 %
Lymphs Abs: 0.9 10*3/uL (ref 0.7–4.0)
MCH: 32 pg (ref 26.0–34.0)
MCHC: 33.3 g/dL (ref 30.0–36.0)
MCV: 96 fL (ref 80.0–100.0)
Monocytes Absolute: 1.6 10*3/uL — ABNORMAL HIGH (ref 0.1–1.0)
Monocytes Relative: 11 %
Neutro Abs: 12.4 10*3/uL — ABNORMAL HIGH (ref 1.7–7.7)
Neutrophils Relative %: 82 %
Platelets: 218 10*3/uL (ref 150–400)
RBC: 3.78 MIL/uL — ABNORMAL LOW (ref 4.22–5.81)
RDW: 14.6 % (ref 11.5–15.5)
WBC: 15 10*3/uL — ABNORMAL HIGH (ref 4.0–10.5)
nRBC: 0 % (ref 0.0–0.2)

## 2019-03-01 LAB — URINE CULTURE

## 2019-03-01 LAB — PROTIME-INR
INR: 5.5 (ref 0.8–1.2)
Prothrombin Time: 48.9 seconds — ABNORMAL HIGH (ref 11.4–15.2)

## 2019-03-01 LAB — LACTIC ACID, PLASMA: Lactic Acid, Venous: 1.9 mmol/L (ref 0.5–1.9)

## 2019-03-01 MED ORDER — FUROSEMIDE 10 MG/ML IJ SOLN
80.0000 mg | Freq: Two times a day (BID) | INTRAMUSCULAR | Status: DC
  Administered 2019-03-01 – 2019-03-02 (×3): 80 mg via INTRAVENOUS
  Filled 2019-03-01 (×3): qty 8

## 2019-03-01 MED ORDER — INFLUENZA VAC A&B SA ADJ QUAD 0.5 ML IM PRSY
0.5000 mL | PREFILLED_SYRINGE | INTRAMUSCULAR | Status: AC
  Administered 2019-03-02: 0.5 mL via INTRAMUSCULAR
  Filled 2019-03-01: qty 0.5

## 2019-03-01 MED ORDER — AZITHROMYCIN 500 MG PO TABS
500.0000 mg | ORAL_TABLET | Freq: Every day | ORAL | Status: DC
  Administered 2019-03-02: 500 mg via ORAL
  Filled 2019-03-01: qty 1

## 2019-03-01 MED ORDER — METOPROLOL TARTRATE 50 MG PO TABS: 50.0000 mg | ORAL_TABLET | Freq: Two times a day (BID) | ORAL | Status: DC

## 2019-03-01 MED ORDER — METOPROLOL TARTRATE 25 MG PO TABS
25.0000 mg | ORAL_TABLET | Freq: Two times a day (BID) | ORAL | Status: DC
  Administered 2019-03-01 – 2019-03-04 (×6): 25 mg via ORAL
  Filled 2019-03-01 (×6): qty 1

## 2019-03-01 NOTE — Progress Notes (Signed)
Requested MD to change Metoprolol tab  to IV while pt.is on Bipap.

## 2019-03-01 NOTE — Progress Notes (Signed)
ANTICOAGULATION CONSULT NOTE - Initial Consult  Pharmacy Consult for Warfarin and Heparin Indication: Mechanical Valve & ACS/Chest Pain  No Known Allergies  Patient Measurements: Height: _0  (167.6 cm) Weight: 178 lb 2.1 oz (80.8 kg) IBW/kg (Calculated) : 63.8  Vital Signs: Temp: 97.8 F (36.6 C) (10/30 0418) Temp Source: Oral (10/30 0418) BP: 131/74 (10/30 0418) Pulse Rate: 90 (10/30 0418)  Labs: Recent Labs    02/28/19 0920 02/28/19 1840 02/28/19 2109 02/28/19 2237 03/01/19 0334  HGB 12.6*  --   --   --  12.1*  HCT 38.5*  --   --   --  36.3*  PLT 205  --   --   --  218  APTT  --  51*  --   --   --   LABPROT 40.9*  --   --   --   --   INR 4.3*  --   --   --   --   CREATININE 2.10*  --   --   --  2.13*  TROPONINIHS 8,649* 11,342* 11,004* 12,409* 12,000*    Estimated Creatinine Clearance: 25.3 mL/min (A) (by C-G formula based on SCr of 2.13 mg/dL (H)).   Medical History: Past Medical History:  Diagnosis Date  . AKI (acute kidney injury) ([place]) 07/29/2017  . Arthritis 02-15-12   osteoarthritis,right knee, pain right wrist.,  . Arthritis    "hands, legs" (12/01/2016)  . Carotid artery disease ([place])    Carotid US 1/18: R 40-59; L 80-99  . Chronic lower back pain   . Complication of anesthesia 02-15-12   very confused, and very slow to awaken after anesthesia  . Coronary artery disease 02-15-12   Heart valve replaced   . Dementia ([place])   . Depression   . Hypercholesterolemia 02-15-12  . Hypertension   . LBBB (left bundle branch block) 02/28/2019  . Memory loss   . PAD (peripheral artery disease) ([place])   . S/P AVR (aortic valve replacement)    Mechanical St. Jude AVR // Echo 12/17: Moderate LVH, EF 60-65, normal wall motion, grade 1 diastolic dysfunction, mechanical AVR okay with mean gradient 8 mmHg, MAC, mild mitral stenosis (mean gradient 2 mmHg), mild MR, mild RAE, PASP 25  . Urinary frequency     Medications:  Scheduled:  . aspirin EC  81 mg Oral  Daily  . atorvastatin  40 mg Oral q1800  . buPROPion  75 mg Oral Daily  . [START ON 03/02/2019] influenza vaccine adjuvanted  0.5 mL Intramuscular Tomorrow-1000  . metoprolol tartrate  25 mg Oral BID  . Warfarin - Pharmacist Dosing Inpatient   Does not apply q1800   Assessment: Pt is a 85 y/o male who presents with progressive generalized fatigue and shortness of breath with infiltrates on x-ray. Furthermore, patient also has elevated troponin. PMH significant for mechanical aortic valve on warfarin. Last dose taken 10/28 _1 . Pharmacy has been consulted to dose warfarin.  Pt's INR today is supratherapeutic at 5.5, trending up from 4.3 yesterday despite holding warfarin. Pt received 1 dose of Levaquin, which could have contributed to rise in INR. Hgb/Hct are wnls at 12.1/36.3. Plts are wnls. No signs/symptoms of bleeding. Troponins continues to be elevated >12,000.   Pt's home warfarin dose per outpatient anticoag clinic note on 10/8 is 5 mg daily, except 7.5 mg on MWF. However, wife reports that pt actually has been taking 5 mg daily since the last clinic visit.   Goal of Therapy:  INR 2.5-3.5  Monitor platelets by anticoagulation protocol: Yes   Plan:  Hold warfarin today, no reversal necessary since pt is not actively bleeding Monitor daily Pt-INR and watch closely for signs/symptoms of bleeding Monitor CBC periodically Per Cardiology, start heparin infusion when INR < 2.5 F/u cath plan with cardiology  Berenice Bouton, PharmD PGY1 Pharmacy Resident Office phone: [phone] 03/01/2019,6:35 AM

## 2019-03-01 NOTE — Progress Notes (Signed)
Progress Note  Patient Name: Juan Li Date of Encounter: 03/01/2019  Primary Cardiologist: Nelva Bush, MD   Subjective   Pt with continued dyspnea on min exertion, now on BiPap. He denies any chest pain/pressure/tightness.   Inpatient Medications    Scheduled Meds: . aspirin EC  81 mg Oral Daily  . atorvastatin  40 mg Oral q1800  . buPROPion  75 mg Oral Daily  . [START ON 03/02/2019] influenza vaccine adjuvanted  0.5 mL Intramuscular Tomorrow-1000  . metoprolol tartrate  25 mg Oral BID  . Warfarin - Pharmacist Dosing Inpatient   Does not apply q1800   Continuous Infusions:  PRN Meds: acetaminophen **OR** acetaminophen, nitroGLYCERIN   Vital Signs    Vitals:   03/01/19 0418 03/01/19 0500 03/01/19 0731 03/01/19 0752  BP: 131/74   135/71  Pulse: 90  (!) 101 88  Resp: 20   (!) 23  Temp: 97.8 F (36.6 C)   98 F (36.7 C)  TempSrc: Oral   Oral  SpO2: 97%  92% 98%  Weight:  80.8 kg    Height:        Intake/Output Summary (Last 24 hours) at 03/01/2019 0948 Last data filed at 03/01/2019 0809 Gross per 24 hour  Intake 1100 ml  Output 1600 ml  Net -500 ml   Last 3 Weights 03/01/2019 02/28/2019 01/16/2019  Weight (lbs) 178 lb 2.1 oz 175 lb 176 lb 6.4 oz  Weight (kg) 80.8 kg 79.379 kg 80.015 kg      Telemetry    Sinus rhythm 80's-90's - Personally Reviewed  ECG    No new tracings for review  Physical Exam   GEN: Elderly pt on BiPap  Neck: No JVD Cardiac: RRR, no murmurs, rubs, or gallops.  Respiratory: Clear to auscultation bilaterally, diminished breath dounds GI: Soft, nontender, non-distended  MS: No edema; No deformity. Neuro:  Nonfocal  Psych: Normal affect   Labs    High Sensitivity Troponin:   Recent Labs  Lab 02/28/19 2109 02/28/19 2237 03/01/19 0334 03/01/19 0453 03/01/19 0658  TROPONINIHS 11,004* 12,409* 12,000* 11,676* 12,126*      Chemistry Recent Labs  Lab 02/28/19 0920 03/01/19 0334  NA 138 141  K 4.6 4.2  CL  111 112*  CO2 15* 16*  GLUCOSE 196* 114*  BUN 37* 42*  CREATININE 2.10* 2.13*  CALCIUM 8.9 8.8*  PROT 6.8 6.5  ALBUMIN 3.7 3.3*  AST 50* 43*  ALT 20 20  ALKPHOS 51 45  BILITOT 2.7* 2.4*  GFRNONAA 28* 27*  GFRAA 32* 32*  ANIONGAP 12 13     Hematology Recent Labs  Lab 02/28/19 0920 03/01/19 0334  WBC 13.4* 15.0*  RBC 3.90* 3.78*  HGB 12.6* 12.1*  HCT 38.5* 36.3*  MCV 98.7 96.0  MCH 32.3 32.0  MCHC 32.7 33.3  RDW 14.6 14.6  PLT 205 218    BNP Recent Labs  Lab 02/28/19 1840  BNP 1,533.4*     DDimer  Recent Labs  Lab 02/28/19 0947  DDIMER <0.27     Radiology    Dg Chest Port 1 View  Result Date: 02/28/2019 CLINICAL DATA:  Shortness of breath EXAM: PORTABLE CHEST 1 VIEW COMPARISON:  07/28/2017 FINDINGS: Prior median sternotomy. Stable cardiomediastinal contours. Patchy right upper lobe airspace opacity. Mildly increased interstitial markings bilaterally. No pleural effusion or pneumothorax. IMPRESSION: Patchy right upper lobe airspace opacity suspicious for pneumonia. Electronically Signed   By: Davina Poke M.D.   On: 02/28/2019 09:35  Cardiac Studies   Echocardiogram 02/28/2019 IMPRESSIONS   1. Left ventricular ejection fraction, by visual estimation, is 40 to 45%. The left ventricle has mildly decreased function. There is no left ventricular hypertrophy.  2. Mechanical aortic valve present. Vmax 1.4 m/s, mean gradient 4 mmHG. No significant abnormalities of the prosthetic valve.  3. Abnormal septal motion consistent with left bundle branch block.  4. Elevated left atrial pressure.  5. Left ventricular diastolic parameters are consistent with Grade III diastolic dysfunction (restrictive).  6. The left ventricle demonstrates global hypokinesis.  7. Global right ventricle has mildly reduced systolic function.The right ventricular size is normal. No increase in right ventricular wall thickness.  8. Left atrial size was mildly dilated.  9. Right  atrial size was normal. 10. Presence of pericardial fat pad. 11. The pericardial effusion is circumferential. 12. Trivial pericardial effusion is present. 13. Mild mitral annular calcification. 14. The mitral valve is degenerative. Mild mitral valve regurgitation. 15. The tricuspid valve is grossly normal. Tricuspid valve regurgitation is mild. 16. Aortic valve regurgitation is not visualized. 17. Bileaflet mechanical prosthesis in the aortic valve position. 18. The pulmonic valve was grossly normal. Pulmonic valve regurgitation is not visualized. 19. TR signal is inadequate for assessing pulmonary artery systolic pressure. 20. The inferior vena cava is normal in size with greater than 50% respiratory variability, suggesting right atrial pressure of 3 mmHg.   Patient Profile     85 y.o. male with a hx of hyperlipidemia, hypertension, dementia, Chronic diastolic Heart Failure, s/p aortic valve replacement on chronic warfarin, chronic neck pain, CKD stage III, Carotid artery stenosis, PAD who is being seen for chest pain at the request of Dr. McDiarmid.  Assessment & Plan    Elevated troponin/NSTEMI Patient presented with SOB admitted for PNA found to have signifigantly elevated troponin - HS troponin up to 12,000, staying relatively flat.  - EKG shows new ST depression V3-V6 and LBBB QRS 130 ms. Previous EKG show incomplete LBBB QRS 114 ms - Patient denies chest pain - No known history of CAD, but very likely with age and risk factors. - Aspirin 81 mg daily, pravastatin 40 mg home meds -Per Dr. Debara Pickett, Ultimately, he will likely need left heart catheterization to define his coronaries, however is possible this could be a left bundle branch block related cardiomyopathy.  Cath will be dependent on his renal function.  At this point he remains quite ill with multiorgan system dysfunction and high risk for further decompensation.  Possible Pneumonia -COVID test negative -Increased O2  requirements, shortness of breath. Placed on BiPap -Management per IM. Pt was given levaquin once yesterday.   Mechanical aortic valve on warfarin -INR was 4.3 on admission, up to 5.5 today -Plan to transition to heparin once INR less than 2.5 -No significant abnormalities of prosthetic valve on echo done yset  Chronic diastolic heart failure -Echo in 10/2018 with EF 60-65%, now echo done yesterday shows EF 40-45%, grade III DD, global hypokinesis, trivial pericardial effusion -Pt was on enalapril and HCTZ in the past, not recently -BNP 1,533 -Pt had 1.1L IV fluids yesterday, now stopped.  -Pt was given lasix 80 mg IV last night. 1.6L urine output.  -Wt is up today, 3 pounds.  -Does not appear significant volume overloaded.   AKI on CKD stage III -Baseline SCr 1.5-1.6.  -Pt had received IVF, now stopped.  -SCr up to 2.13 today. Stable from yesterday at 2.10.  -Avoid nephrotoxic agents  Hypertension -BP is stable.  Hyperlpidemia - Pravastatin 40 mg home dose - LDL 114 in 2019. Will recheck  Dementia - stable    For questions or updates, please contact [place] Please consult [email or web address] for contact info under        Signed, Daune Perch, NP  03/01/2019, 9:48 AM

## 2019-03-01 NOTE — Discharge Summary (Signed)
[facility] Discharge Summary  Patient name: Juan Li Medical record number: [id number] Date of birth: 1932/02/01 Age: 85 y.o. Gender: male Date of Admission: 02/28/2019  Date of Discharge: 02/05/2019 Admitting Physician: Blane Ohara McDiarmid, MD  Primary Care Provider: Zenia Resides, MD Consultants: Cardiology  Indication for Hospitalization: Shortness of breath and generalized fatigue.  Discharge Diagnoses/Problem List:  Multifocal PNA NSTEMI Dementia Gout HLD HTN AAA GERD CKD 3 BPH Tobacco use CAD PVD HFmrEF S/p AVR with mechanical valve on warfarin  Disposition: [facility] to [facility]  Discharge Condition: Stable for discharge and transport, overall poor prognosis  Discharge Exam:  General: resting in bed, nontoxic-appearing, no apparent distress Cardiovascular: Irregular rhythm, rate controlled, no murmurs appreciated Respiratory: Moving air well, occasional coar/se breath sounds, difficult to auscultate due to snoring Abdomen: Sounds appreciated Extremities: 2+ DP pulses to bilateral lower extremities, no peripheral edema  Brief Hospital Course:  Patient was admitted on October 29th for shortness of breath and generalized fatigue. He was found to have multifocal pneumonia as well as an NSTEMI with new left bundle branch block.  Patient was treated with Levaquin on 10/29-30, then received vancomycin, cefepime, azithromycin on 10/31-11/2. He then developed multisystem organ failure.  Family decided to make patient DNR and ultimately decided upon comfort care.   Issues for Follow Up:  1. DNR/DNI, no BiPap. 2. No artifical feeding or hydration now or in the future. 3. No further labs, diagnostics, life prolonging measures, allow for a natural death. 4. Symptom management with Morphine and Ativan as needed. 5. Family is planning for end-of-life care.    Prognosis is less than 2 weeks  Significant Procedures:  Echocardiogram 02/05/2019  Significant Labs and Imaging:  Recent Labs  Lab 03/02/19 0521 03/03/19 0513 03/04/19 0622  WBC 14.2* 15.3* 15.1*  HGB 11.4* 10.8* 10.9*  HCT 34.6* 33.3* 33.7*  PLT 232 260 296   Recent Labs  Lab 03/02/19 0521 03/02/19 1232 03/03/19 0513 03/04/19 0622  NA 139  --  138 139  K 4.3  --  3.9 5.1  CL 106  --  105 109  CO2 19*  --  15* 16*  GLUCOSE 122*  --  94 129*  BUN 66*  --  83* 107*  CREATININE 2.84*  --  2.92* 3.23*  CALCIUM 9.1  --  8.6* 8.5*  ALKPHOS 47 48 44 55  AST 313* 297* 163* 442*  ALT 242* 261* 251* 549*  ALBUMIN 3.1* 3.2* 3.1* 2.8*    Results/Tests Pending at Time of Discharge: None  Discharge Medications:  Allergies as of 03/08/2019   No Known Allergies     Medication List    STOP taking these medications   acetaminophen 500 MG tablet Commonly known as: TYLENOL Replaced by: acetaminophen 650 MG suppository   amLODipine 5 MG tablet Commonly known as: NORVASC   buPROPion 75 MG tablet Commonly known as: WELLBUTRIN   finasteride 5 MG tablet Commonly known as: PROSCAR   memantine 5 MG tablet Commonly known as: NAMENDA   mirabegron ER 25 MG Tb24 tablet Commonly known as: Myrbetriq   Muscle Rub 10-15 % Crea   pravastatin 40 MG tablet Commonly known as: PRAVACHOL   tamsulosin 0.4 MG Caps capsule Commonly known as: FLOMAX   warfarin 5 MG tablet Commonly known as: COUMADIN     TAKE these medications   acetaminophen 650 MG suppository Commonly known as: TYLENOL Place 1 suppository (650 mg total) rectally every 6 (six) hours as  needed for mild pain (or Fever >/= 101). Replaces: acetaminophen 500 MG tablet   LORazepam 2 MG/ML concentrated solution Commonly known as: ATIVAN Place 0.5 mLs (1 mg total) under the tongue every 4 (four) hours as needed for anxiety.   morphine 2 MG/ML injection Inject 0.5 mLs (1 mg total) into the vein every 2 (two) hours as needed (or dyspnea).   ondansetron 4 MG/5ML  solution Commonly known as: Zofran Take 5 mLs (4 mg total) by mouth every 8 (eight) hours as needed for nausea or vomiting.   phenol 1.4 % Liqd Commonly known as: CHLORASEPTIC Use as directed 1 spray in the mouth or throat as needed for throat irritation / pain.      Discharge Instructions: Please refer to Patient Instructions section of EMR for full details.  Patient was counseled important signs and symptoms that should prompt return to medical care, changes in medications, dietary instructions, activity restrictions, and follow up appointments.    Daisy Floro, DO 03/08/2019, 12:26 PM PGY-1, [facility]

## 2019-03-01 NOTE — Evaluation (Signed)
Physical Therapy Evaluation Patient Details Name: Juan Li MRN: [id number] DOB: [birth year] Today's Date: 03/01/2019   History of Present Illness  85 y.o. male presenting with progressive generalized fatigue and shortness of breath, found to have infiltrate on x-ray concerning for pneumonia and elevated troponin. PMH is significant for hyperlipidemia, dementia, hypertension, heart failure with preserved ejection fraction, s/p aortic valve replacement on chronic anticoagulation with warfarin, chronic neck pain, CAD/PVD.  Clinical Impression  Pt demonstrates deficits in functional mobility, gait, balance, and cardiopulmonary function. Pt on BiPap during session with significantly reduced activity tolerance during session. Pt only able to tolerate bed mobility, and fatiguing rapidly once sitting. Pt will benefit from continued acute PT services to progress upon endurance and independence in mobility. PT anticipates the patient will progress to a home level for discharge, as he has good LE strength and power and mobilizes with minimal physical assistance at this time.    Follow Up Recommendations Home health PT;Supervision/Assistance - 24 hour(hopeful pt progresses with improved respiratory status)    Equipment Recommendations  Other (comment)(will continue to assess)    Recommendations for Other Services       Precautions / Restrictions Precautions Precautions: Fall Restrictions Weight Bearing Restrictions: No      Mobility  Bed Mobility Overal bed mobility: Needs Assistance Bed Mobility: Rolling;Supine to Sit;Sit to Supine Rolling: Supervision   Supine to sit: Min assist Sit to supine: Mod assist   General bed mobility comments: pt fatigues rapidly, only tolerating sitting edge of bed for ~10 seconds  Transfers Overall transfer level: (pt unable to tolerate OOB activity at this time)                  Ambulation/Gait                Stairs             Wheelchair Mobility    Modified Rankin (Stroke Patients Only)       Balance Overall balance assessment: Needs assistance Sitting-balance support: Bilateral upper extremity supported;Feet supported Sitting balance-Leahy Scale: Fair Sitting balance - Comments: minG   Standing balance support: (pt unable to tolerate OOB activity 2/2 SOB)                                 Pertinent Vitals/Pain Pain Assessment: No/denies pain    Home Living Family/patient expects to be discharged to:: Private residence Living Arrangements: Spouse/significant other Available Help at Discharge: Family;Available 24 hours/day Type of Home: House Home Access: Stairs to enter Entrance Stairs-Rails: None Entrance Stairs-Number of Steps: 3 Home Layout: Two level Home Equipment: Cane - single point;Walker - 4 wheels      Prior Function Level of Independence: Independent         Comments: household ambulator     Hand Dominance        Extremity/Trunk Assessment   Upper Extremity Assessment Upper Extremity Assessment: Overall WFL for tasks assessed    Lower Extremity Assessment Lower Extremity Assessment: Overall WFL for tasks assessed    Cervical / Trunk Assessment Cervical / Trunk Assessment: Normal  Communication   Communication: Other (comment)(pt on BiPap muffling speech)  Cognition Arousal/Alertness: Awake/alert Behavior During Therapy: WFL for tasks assessed/performed Overall Cognitive Status: Within Functional Limits for tasks assessed  General Comments General comments (skin integrity, edema, etc.): pt on BiPap during session, fatiguing rapidly with mobility. Pt takes ~1-2 minutes to recover to resting RR while resting in supine    Exercises     Assessment/Plan    PT Assessment Patient needs continued PT services  PT Problem List Decreased activity tolerance;Decreased balance;Decreased  mobility;Cardiopulmonary status limiting activity       PT Treatment Interventions DME instruction;Gait training;Stair training;Functional mobility training;Therapeutic activities;Therapeutic exercise;Balance training;Neuromuscular re-education;Patient/family education    PT Goals (Current goals can be found in the Care Plan section)  Acute Rehab PT Goals Patient Stated Goal: To improve breathing PT Goal Formulation: With patient Time For Goal Achievement: 03/15/19 Potential to Achieve Goals: Good    Frequency Min 3X/week   Barriers to discharge        Co-evaluation               AM-PAC PT "6 Clicks" Mobility  Outcome Measure Help needed turning from your back to your side while in a flat bed without using bedrails?: A Little Help needed moving from lying on your back to sitting on the side of a flat bed without using bedrails?: A Little Help needed moving to and from a bed to a chair (including a wheelchair)?: A Lot Help needed standing up from a chair using your arms (e.g., wheelchair or bedside chair)?: A Lot Help needed to walk in hospital room?: A Lot Help needed climbing 3-5 steps with a railing? : Total 6 Click Score: 13    End of Session Equipment Utilized During Treatment: Oxygen Activity Tolerance: Patient limited by fatigue Patient left: in bed;with call bell/phone within reach Nurse Communication: Mobility status PT Visit Diagnosis: Difficulty in walking, not elsewhere classified (R26.2)    Time: [id number] PT Time Calculation (min) (ACUTE ONLY): 13 min   Charges:   PT Evaluation $PT Eval Low Complexity: [place], PT, DPT Acute Rehabilitation Pager: [place] 03/01/2019, 11:57 AM

## 2019-03-01 NOTE — Progress Notes (Addendum)
Family Medicine Teaching Service Daily Progress Note Intern Pager: [phone]  Patient name: Juan Li Medical record number: [id number] Date of birth: 1932-01-23 Age: 85 y.o. Gender: male  Primary Care Provider: Zenia Resides, MD Consultants: Cardiology Code Status: DO NOT INTUBATE  Pt Overview and Major Events to Date:  10/29-admit to hospital for shortness of breath generalized fatigue  Assessment and Plan: Juan Li is a 85 y.o. male presenting with progressive generalized fatigue and shortness of breath, found to have infiltrate on x-ray concerning for pneumonia and elevated troponin. PMH is significant for hyperlipidemia, dementia, hypertension, heart failure with preserved ejection fraction, s/p aortic valve replacement on chronic anticoagulation with warfarin, chronic neck pain, CAD/PVD.   Dyspnea with generalized weakness: Acute, improving. Vital signs stable overnight.  BiPAP overnight FiO2 40%. Patient currently on room air satting 97%.  No breathing treatment overnight. Labs significant for leukocytosis of [id number] with neutrophilic shift. Chest x-ray on admission showed patchy right upper lobe airspace opacity suspicious for pneumonia.  Received 1 dose of Levaquin in ED (10/29) Covid negative.  Procalcitonin less than 0.1, less likely bacterial pneumonia.  On exam pt is currently on CPAP -Blood cultures NGTD<24hrs -Follow-up urine culture -Maintain oxygen saturations greater than 92% -Start Zithromax 500 mg x 3-day course (10/31-) -Vitals per unit -CBC in a.m.  Troponin elevation: Acute. Troponin 12,126.  Increased from 8649 on admission.  On exam patient denies any chest pain. -Cardiology following              -Likely need heart cath pending kidney function              -ASA 81 mg              -Pravastatin 40 mg daily              -Heparin per pharmacy protocol once INR drops below 2.5. -BMP in a.m. -Trend troponin -Follow-up EKG -Cardiac  monitoring.   Mechanical aortic valve on chronic anticoagulation:  INR 5.5.  Home medication warfarin 5 mg daily. -Pharmacy consult for warfarin monitoring  Hypertension Was hypotensive on admission.  Remained normotensive overnight. Home medications amlodipine 5 mg daily -Monitor blood pressure  AKI on CKD stage III: Acute. Creatinine 2.13.  Baseline 1.5. -Monitor daily  HFpEF  PVD  CAD: Stable. 10/29-echo impressive for left ventricular ejection fraction 40 to 45%.  Left ventricle with mildly decreased function.  Mechanical aortic valve, and abnormal motion consistent with left bundle branch block.  Elevated left atrial pressures. Left ventricular diastolic pressure G3 DD along with left ventricule global hypokinesis.  Also concerning for global right ventricle reduced systolic function.  -Monitor blood pressure -Continue protocol -Cardiology following, Lasix 80 mg IV BID  Chronic neck pain: Stable. Chronic -Continue to monitor.  Dementia: Chronic, stable. Lives with his wife as primary caretaker.  Alert and oriented on exam, reasonable historian.  Takes Namenda daily. -Delirium precautions -Will touch base with wife to clarify any additional history  Hyperlipidemia: Chronic, stable. -Continue Pravachol  BPH: Stable. -Continue Flomax and Proscar  Normocytic anemia: Chronic, stable. Asymptomatic.  Hemoglobin 12.1. -Continue to monitor  FEN/GI: NPO Prophylaxis:  Warfarin  Disposition: Home when cleared by cardiology.  Subjective:  No acute events overnight.  Denies any chest pain.  States that 3 days, little better since admission.  Currently on CPAP.  Objective: Temp:  [97.4 F (36.3 C)-98.7 F (37.1 C)] 98.7 F (37.1 C) (10/30 1144) Pulse Rate:  [86-104] 90 (10/30 1310) Resp:  [  19-31] 24 (10/30 1144) BP: (100-160)/(67-96) 133/67 (10/30 1144) SpO2:  [91 %-99 %] 98 % (10/30 1144) FiO2 (%):  [40 %] 40 % (10/30 0300) Weight:  [80.8 kg] 80.8 kg (10/30  0500) Physical Exam: General: 85 year old gentleman, in no acute distress Cardiovascular: Regular rate and rhythm, no murmurs appreciated.  Mechanical click Respiratory: Clear to auscultation bilateral, diminished at bases.  No crackles, no rhonchi noted, Abdomen: Soft, nontender, nondistended, bowel sounds present, no organomegaly Extremities: Moving all extremities, no lower extremity edema noted.  Laboratory: Recent Labs  Lab 02/28/19 0920 03/01/19 0334  WBC 13.4* 15.0*  HGB 12.6* 12.1*  HCT 38.5* 36.3*  PLT 205 218   Recent Labs  Lab 02/28/19 0920 03/01/19 0334  NA 138 141  K 4.6 4.2  CL 111 112*  CO2 15* 16*  BUN 37* 42*  CREATININE 2.10* 2.13*  CALCIUM 8.9 8.8*  PROT 6.8 6.5  BILITOT 2.7* 2.4*  ALKPHOS 51 45  ALT 20 20  AST 50* 43*  GLUCOSE 196* 114*      Imaging/Diagnostic Tests: No results found.  Juan Leitz, MD 03/01/2019, 3:51 PM PGY-1, [place] Intern pager: [phone], text pages welcome

## 2019-03-01 NOTE — Progress Notes (Signed)
Pt's  INR 5.5 - Dr. Volanda Napoleon paged, awaiting to call back.

## 2019-03-01 NOTE — TOC Initial Note (Signed)
Transition of Care [facility]) - Initial/Assessment Note    Patient Details  Name: Juan Li MRN: [id number] Date of Birth: October 19, 1931  Transition of Care [facility] CM/SW Contact:    Zenon Mayo, RN Phone Number: 03/01/2019, 4:10 PM  Clinical Narrative:                 From home with wife, NCM offered choice for HHPT, patient states he does not have a preference, NCM made referral to [facility] with Georgina Snell.  He is able to take referral. Soc will begin 24 to 48 hrs post dc. Wife will transport patient home.  He does not have any problems getting his medications.   Expected Discharge Plan: [place] Barriers to Discharge: No Barriers Identified   Patient Goals and CMS Choice Patient states their goals for this hospitalization and ongoing recovery are:: get better CMS Medicare.gov Compare Post Acute Care list provided to:: Patient Choice offered to / list presented to : Patient  Expected Discharge Plan and Services Expected Discharge Plan: [place] In-house Referral: NA Discharge Planning Services: CM Consult Post Acute Care Choice: [place] arrangements for the past 2 months: Single Family Home                 DME Arranged: (NA)         HH Arranged: PT HH Agency: [place] Date [facility] Agency Contacted: 03/01/19 Time HH Agency Contacted: 44 Representative spoke with at [place]: Georgina Snell  Prior Living Arrangements/Services Living arrangements for the past 2 months: [place] Lives with:: Spouse Patient language and need for interpreter reviewed:: Yes Do you feel safe going back to the place where you live?: Yes      Need for Family Participation in Patient Care: Yes (Comment) Care giver support system in place?: Yes (comment) Current home services: DME(walker, w/chair) Criminal Activity/Legal Involvement Pertinent to Current Situation/Hospitalization: No - Comment as needed  Activities of Daily  Living Home Assistive Devices/Equipment: Wheelchair ADL Screening (condition at time of admission) Patient's cognitive ability adequate to safely complete daily activities?: Yes Is the patient deaf or have difficulty hearing?: No Does the patient have difficulty seeing, even when wearing glasses/contacts?: No Does the patient have difficulty concentrating, remembering, or making decisions?: No Patient able to express need for assistance with ADLs?: Yes Does the patient have difficulty dressing or bathing?: Yes Independently performs ADLs?: No Communication: Independent Dressing (OT): Independent Grooming: Independent Feeding: Independent Bathing: Needs assistance Is this a change from baseline?: Pre-admission baseline Toileting: Needs assistance Is this a change from baseline?: Pre-admission baseline In/Out Bed: Needs assistance Is this a change from baseline?: Pre-admission baseline Walks in Home: Needs assistance Is this a change from baseline?: Pre-admission baseline Does the patient have difficulty walking or climbing stairs?: Yes Weakness of Legs: Both Weakness of Arms/Hands: None  Permission Sought/Granted                  Emotional Assessment Appearance:: Appears stated age Attitude/Demeanor/Rapport: Gracious Affect (typically observed): Appropriate Orientation: : Oriented to Self, Oriented to Place, Oriented to  Time, Oriented to Situation Alcohol / Substance Use: Not Applicable Psych Involvement: No (comment)  Admission diagnosis:  SOB (shortness of breath) [R06.02] Patient Active Problem List   Diagnosis Date Noted  . Community acquired pneumonia 02/28/2019  . Troponin I above reference range 02/28/2019  . SOB (shortness of breath) 02/28/2019  . Acute hypoxemic respiratory failure ([place])   .  Acute on chronic combined systolic (congestive) and diastolic (congestive) heart failure ([place])   . S/P AVR (aortic valve replacement)   . Elevated INR   . Cervical  myelopathy ([place]) 07/11/2017  . Peripheral vascular disease ([place]) 12/16/2016  . Cervical spondylosis with myelopathy 01/28/2016  . Encounter for palliative care 11/21/2014  . Stenosis of cervical spine region 07/18/2014  . Chronic anticoagulation 06/28/2010  . CKD (chronic kidney disease) stage 3, GFR 30-59 ml/min 08/06/2008  . Dementia ([place]) 05/25/2007  . OSTEOARTHROSIS, GENERALIZED, MULTIPLE SITES 09/18/2006  . HYPERCHOLESTEROLEMIA 06/29/2006  . Essential hypertension 06/29/2006  . BPH (benign prostatic hyperplasia) 06/29/2006   PCP:  Zenia Resides, MD Pharmacy:   [id number] [place], [place] [address] Phone: [phone] Fax: [phone]  [facility] [place] [place] [facility] [address] [place] [address] Phone: [phone] Fax: [phone]     Social Determinants of Health (SDOH) Interventions    Readmission Risk Interventions No flowsheet data found.

## 2019-03-02 ENCOUNTER — Encounter (HOSPITAL_COMMUNITY): Payer: Self-pay | Admitting: Family Medicine

## 2019-03-02 ENCOUNTER — Inpatient Hospital Stay (HOSPITAL_COMMUNITY): Payer: Medicare Other

## 2019-03-02 DIAGNOSIS — R400 Somnolence: Secondary | ICD-10-CM | POA: Diagnosis present

## 2019-03-02 DIAGNOSIS — R7401 Elevation of levels of liver transaminase levels: Secondary | ICD-10-CM

## 2019-03-02 DIAGNOSIS — R7989 Other specified abnormal findings of blood chemistry: Secondary | ICD-10-CM | POA: Diagnosis not present

## 2019-03-02 DIAGNOSIS — F0150 Vascular dementia without behavioral disturbance: Secondary | ICD-10-CM | POA: Diagnosis not present

## 2019-03-02 DIAGNOSIS — J189 Pneumonia, unspecified organism: Secondary | ICD-10-CM

## 2019-03-02 DIAGNOSIS — I739 Peripheral vascular disease, unspecified: Secondary | ICD-10-CM

## 2019-03-02 DIAGNOSIS — R69 Illness, unspecified: Secondary | ICD-10-CM | POA: Diagnosis not present

## 2019-03-02 DIAGNOSIS — N179 Acute kidney failure, unspecified: Secondary | ICD-10-CM

## 2019-03-02 DIAGNOSIS — I5043 Acute on chronic combined systolic (congestive) and diastolic (congestive) heart failure: Secondary | ICD-10-CM | POA: Diagnosis not present

## 2019-03-02 DIAGNOSIS — J9601 Acute respiratory failure with hypoxia: Secondary | ICD-10-CM | POA: Diagnosis not present

## 2019-03-02 DIAGNOSIS — R791 Abnormal coagulation profile: Secondary | ICD-10-CM

## 2019-03-02 DIAGNOSIS — I214 Non-ST elevation (NSTEMI) myocardial infarction: Secondary | ICD-10-CM | POA: Diagnosis present

## 2019-03-02 DIAGNOSIS — Z8673 Personal history of transient ischemic attack (TIA), and cerebral infarction without residual deficits: Secondary | ICD-10-CM

## 2019-03-02 HISTORY — DX: Personal history of transient ischemic attack (TIA), and cerebral infarction without residual deficits: Z86.73

## 2019-03-02 LAB — BLOOD GAS, ARTERIAL
Acid-base deficit: 5.3 mmol/L — ABNORMAL HIGH (ref 0.0–2.0)
Bicarbonate: 17.8 mmol/L — ABNORMAL LOW (ref 20.0–28.0)
Drawn by: 533091
FIO2: 52
O2 Saturation: 91.9 %
Patient temperature: 98.2
pCO2 arterial: 24.7 mmHg — ABNORMAL LOW (ref 32.0–48.0)
pH, Arterial: 7.47 — ABNORMAL HIGH (ref 7.350–7.450)
pO2, Arterial: 63.5 mmHg — ABNORMAL LOW (ref 83.0–108.0)

## 2019-03-02 LAB — CBC WITH DIFFERENTIAL/PLATELET
Abs Immature Granulocytes: 0.11 10*3/uL — ABNORMAL HIGH (ref 0.00–0.07)
Basophils Absolute: 0 10*3/uL (ref 0.0–0.1)
Basophils Relative: 0 %
Eosinophils Absolute: 0 10*3/uL (ref 0.0–0.5)
Eosinophils Relative: 0 %
HCT: 34.6 % — ABNORMAL LOW (ref 39.0–52.0)
Hemoglobin: 11.4 g/dL — ABNORMAL LOW (ref 13.0–17.0)
Immature Granulocytes: 1 %
Lymphocytes Relative: 9 %
Lymphs Abs: 1.2 10*3/uL (ref 0.7–4.0)
MCH: 31.6 pg (ref 26.0–34.0)
MCHC: 32.9 g/dL (ref 30.0–36.0)
MCV: 95.8 fL (ref 80.0–100.0)
Monocytes Absolute: 1 10*3/uL (ref 0.1–1.0)
Monocytes Relative: 7 %
Neutro Abs: 11.8 10*3/uL — ABNORMAL HIGH (ref 1.7–7.7)
Neutrophils Relative %: 83 %
Platelets: 232 10*3/uL (ref 150–400)
RBC: 3.61 MIL/uL — ABNORMAL LOW (ref 4.22–5.81)
RDW: 14.6 % (ref 11.5–15.5)
WBC: 14.2 10*3/uL — ABNORMAL HIGH (ref 4.0–10.5)
nRBC: 0.1 % (ref 0.0–0.2)

## 2019-03-02 LAB — HEPATIC FUNCTION PANEL
ALT: 261 U/L — ABNORMAL HIGH (ref 0–44)
AST: 297 U/L — ABNORMAL HIGH (ref 15–41)
Albumin: 3.2 g/dL — ABNORMAL LOW (ref 3.5–5.0)
Alkaline Phosphatase: 48 U/L (ref 38–126)
Bilirubin, Direct: 0.4 mg/dL — ABNORMAL HIGH (ref 0.0–0.2)
Indirect Bilirubin: 2.4 mg/dL — ABNORMAL HIGH (ref 0.3–0.9)
Total Bilirubin: 2.8 mg/dL — ABNORMAL HIGH (ref 0.3–1.2)
Total Protein: 6.7 g/dL (ref 6.5–8.1)

## 2019-03-02 LAB — COMPREHENSIVE METABOLIC PANEL
ALT: 242 U/L — ABNORMAL HIGH (ref 0–44)
AST: 313 U/L — ABNORMAL HIGH (ref 15–41)
Albumin: 3.1 g/dL — ABNORMAL LOW (ref 3.5–5.0)
Alkaline Phosphatase: 47 U/L (ref 38–126)
Anion gap: 14 (ref 5–15)
BUN: 66 mg/dL — ABNORMAL HIGH (ref 8–23)
CO2: 19 mmol/L — ABNORMAL LOW (ref 22–32)
Calcium: 9.1 mg/dL (ref 8.9–10.3)
Chloride: 106 mmol/L (ref 98–111)
Creatinine, Ser: 2.84 mg/dL — ABNORMAL HIGH (ref 0.61–1.24)
GFR calc Af Amer: 22 mL/min — ABNORMAL LOW (ref >60)
GFR calc non Af Amer: 19 mL/min — ABNORMAL LOW (ref >60)
Glucose, Bld: 122 mg/dL — ABNORMAL HIGH (ref 70–99)
Potassium: 4.3 mmol/L (ref 3.5–5.1)
Sodium: 139 mmol/L (ref 135–145)
Total Bilirubin: 2.8 mg/dL — ABNORMAL HIGH (ref 0.3–1.2)
Total Protein: 6.5 g/dL (ref 6.5–8.1)

## 2019-03-02 LAB — PROTIME-INR
INR: 5.5 (ref 0.8–1.2)
Prothrombin Time: 48.9 seconds — ABNORMAL HIGH (ref 11.4–15.2)

## 2019-03-02 LAB — LACTIC ACID, PLASMA
Lactic Acid, Venous: 3 mmol/L (ref 0.5–1.9)
Lactic Acid, Venous: 3.5 mmol/L (ref 0.5–1.9)
Lactic Acid, Venous: 2.8 mmol/L (ref 0.5–1.9)
Lactic Acid, Venous: 2.4 mmol/L (ref 0.5–1.9)

## 2019-03-02 MED ORDER — LORAZEPAM 0.5 MG PO TABS
0.5000 mg | ORAL_TABLET | Freq: Once | ORAL | Status: AC
  Administered 2019-03-02: 0.5 mg via ORAL
  Filled 2019-03-02 (×2): qty 1

## 2019-03-02 MED ORDER — SODIUM CHLORIDE 0.9 % IV SOLN
2.0000 g | INTRAVENOUS | Status: DC
  Administered 2019-03-02 – 2019-03-04 (×3): 2 g via INTRAVENOUS
  Filled 2019-03-02 (×3): qty 2

## 2019-03-02 MED ORDER — POLYETHYLENE GLYCOL 3350 17 G PO PACK
17.0000 g | PACK | Freq: Once | ORAL | Status: AC
  Administered 2019-03-02: 17 g via ORAL
  Filled 2019-03-02: qty 1

## 2019-03-02 MED ORDER — LEVALBUTEROL TARTRATE 45 MCG/ACT IN AERO: 2.0000 | INHALATION_SPRAY | Freq: Four times a day (QID) | RESPIRATORY_TRACT | Status: DC

## 2019-03-02 MED ORDER — LEVALBUTEROL HCL 0.63 MG/3ML IN NEBU
0.6300 mg | INHALATION_SOLUTION | Freq: Four times a day (QID) | RESPIRATORY_TRACT | Status: DC
  Administered 2019-03-02 (×2): 0.63 mg via RESPIRATORY_TRACT
  Filled 2019-03-02 (×2): qty 3

## 2019-03-02 MED ORDER — PREDNISONE 20 MG PO TABS
40.0000 mg | ORAL_TABLET | Freq: Every day | ORAL | Status: DC
  Administered 2019-03-03 – 2019-03-04 (×2): 40 mg via ORAL
  Filled 2019-03-02 (×3): qty 2

## 2019-03-02 MED ORDER — VANCOMYCIN HCL 10 G IV SOLR
1750.0000 mg | Freq: Once | INTRAVENOUS | Status: AC
  Administered 2019-03-02: 1750 mg via INTRAVENOUS
  Filled 2019-03-02: qty 1750

## 2019-03-02 MED ORDER — SODIUM CHLORIDE 0.9 % IV SOLN
INTRAVENOUS | Status: DC | PRN
  Administered 2019-03-02: 1000 mL via INTRAVENOUS

## 2019-03-02 MED ORDER — VANCOMYCIN VARIABLE DOSE PER UNSTABLE RENAL FUNCTION (PHARMACIST DOSING): Status: DC

## 2019-03-02 MED ORDER — POLYETHYLENE GLYCOL 3350 17 G PO PACK: 17.0000 g | PACK | Freq: Every day | ORAL | Status: DC

## 2019-03-02 MED ORDER — SODIUM CHLORIDE 0.9 % IV SOLN
INTRAVENOUS | Status: DC
  Administered 2019-03-02: 20:00:00 via INTRAVENOUS

## 2019-03-02 MED ORDER — SODIUM CHLORIDE 0.9 % IV SOLN: 1.0000 g | INTRAVENOUS | Status: DC

## 2019-03-02 NOTE — Progress Notes (Signed)
Discussed CODE STATUS today with Mr. Juan Li, his wife and son.  Mr. Juan Li and his family were present in the room during our discussion.  He and his family expressed that they would want Mr. Juan Li to be intubated if there were hope that his respiratory distress could be reversed and he would be taken off of the ventilator after several days.  Mr. Juan Li and his family were not interested in intubation if he had no hope of improving and he would likely end up on the ventilator long-term.  In the light of his imaging today, it seems that a large contributor to his respiratory distress is a worsening multifocal pneumonia.  His family was informed that he would likely experience significant improvement following several days of antibiotics.  With this in mind, he and his family were in favor of intubation during this hospitalization if it it would be helpful for short-term treatment of his pneumonia.  CODE STATUS changed from DNI to to full code.  Matilde Haymaker, MD

## 2019-03-02 NOTE — Progress Notes (Signed)
Spoke with Dr. Sonia Side with cardiology regarding giving fluids to Juan Li while he is potentially experiencing an element of heart failure.  After brief discussion, Dr. Sonia Side noted that it would be appropriate to administer light intravenous fluids with a hard stop time and regular assessment.  NS at maintenance rate for 12 hours started.  Matilde Haymaker, MD

## 2019-03-02 NOTE — Progress Notes (Signed)
Spoke with wife, [place], on the phone regarding CODE STATUS.  Mrs. Purgeson noted that they had initially agreed to a DO NOT INTUBATE status if Mr. Juan Li was thought to have an irreversible cause of of dyspnea.  However, if his breathing trouble was thought to be reversible and treatable than that would be okay with a temporary course of intubation.  Mrs. Purgeson is planning on coming to the hospital this afternoon at which time Mr. Juan Li CODE STATUS can be discussed with the husband, wife and provider.  No changes will be made in the chart until this meeting takes place.  Matilde Haymaker, MD

## 2019-03-02 NOTE — Progress Notes (Signed)
ANTICOAGULATION CONSULT NOTE - Initial Consult  Pharmacy Consult for Warfarin and Heparin Indication: Mechanical Valve & ACS/Chest Pain  No Known Allergies  Patient Measurements: Height: _0  (167.6 cm) Weight: 171 lb 11.8 oz (77.9 kg) IBW/kg (Calculated) : 63.8  Vital Signs: Temp: 98.2 F (36.8 C) (10/31 0429) Temp Source: Oral (10/31 0429) BP: 105/61 (10/31 0429) Pulse Rate: 71 (10/31 0429)  Labs: Recent Labs    02/28/19 0920 02/28/19 1840  03/01/19 0334 03/01/19 0453 03/01/19 0658 03/01/19 1554 03/02/19 0521  HGB 12.6*  --   --  12.1*  --   --   --  11.4*  HCT 38.5*  --   --  36.3*  --   --   --  34.6*  PLT 205  --   --  218  --   --   --  232  APTT  --  51*  --   --   --   --   --   --   LABPROT 40.9*  --   --  48.9*  --   --   --  48.9*  INR 4.3*  --   --  5.5*  --   --   --  5.5*  CREATININE 2.10*  --   --  2.13*  --   --   --  2.84*  TROPONINIHS 8,649* 11,342*   < > 12,000* 11,676* 12,126* 9,409*  --    < > = values in this interval not displayed.    Estimated Creatinine Clearance: 18.7 mL/min (A) (by C-G formula based on SCr of 2.84 mg/dL (H)).   Medical History: Past Medical History:  Diagnosis Date  . (HFpEF) heart failure with preserved ejection fraction ([place]) 06/24/2017  . Acute gout involving toe of left foot 12/16/2016  . AKI (acute kidney injury) ([place]) 07/29/2017  . Arthritis 02-15-12   osteoarthritis,right knee, pain right wrist.,  . Arthritis    "hands, legs" (12/01/2016)  . BACK PAIN, LOW 06/29/2006   Qualifier: Diagnosis of  By: Benna Dunks    . Bradycardia 10/06/2013  . Carotid artery disease ([place])    Carotid US 1/18: R 40-59; L 80-99  . Carpal tunnel syndrome 06/29/2006   Qualifier: Diagnosis of  By: Benna Dunks    . Chronic diastolic CHF (congestive heart failure) ([place]) 08/21/2012  . Chronic lower back pain   . Complication of anesthesia 02-15-12   very confused, and very slow to awaken after anesthesia  . Coronary artery disease  02-15-12   Heart valve replaced   . Dementia ([place])   . Depression   . Gait abnormality 09/02/2011  . GASTROESOPHAGEAL REFLUX, NO ESOPHAGITIS 06/29/2006   Qualifier: Diagnosis of  By: Benna Dunks    . Gouty arthropathy 06/29/2006   Mild - only one or two episodes.      Marland Kitchen HEART VALVE REPLACEMENT, HX OF 12/31/2008   Qualifier: Diagnosis of  By: Olevia Perches, MD, Glenetta Hew   . Hx of excision of lamina of cervical vertebra for decompression of spinal cord 08/30/2017  . Hypercholesterolemia 02-15-12  . Hypertension   . LBBB (left bundle branch block) 02/28/2019  . Memory loss   . PAD (peripheral artery disease) ([place])   . ROTATOR CUFF TENDONITIS 06/29/2006   Qualifier: Diagnosis of  By: Benna Dunks    . S/P AVR (aortic valve replacement)    Mechanical [employer] AVR // Echo 12/17: Moderate LVH, EF 60-65, normal wall motion, grade 1 diastolic dysfunction, mechanical AVR  okay with mean gradient 8 mmHg, MAC, mild mitral stenosis (mean gradient 2 mmHg), mild MR, mild RAE, PASP 25  . Skin cancer 06/29/2006   Multiple areas of skin cancer change.  Sees Dermatologist at the skin surgery center.  See scanned note of 12/12/16.  Four biopsies. 1. AK 2. Squamus cell 3. Squamus cell 4. Basal cell infiltrating.  Given frequency and complexity, needs to stay with derm.     . TOBACCO USE, QUIT 06/29/2006   Qualifier: Diagnosis of  By: Benna Dunks    . Unspecified vitamin D deficiency 11/30/2012   Blood work at [facility] showed level=18   . Urinary frequency     Medications:  Scheduled:  . aspirin EC  81 mg Oral Daily  . atorvastatin  40 mg Oral q1800  . azithromycin  500 mg Oral Daily  . buPROPion  75 mg Oral Daily  . furosemide  80 mg Intravenous BID  . influenza vaccine adjuvanted  0.5 mL Intramuscular Tomorrow-1000  . metoprolol tartrate  25 mg Oral BID  . Warfarin - Pharmacist Dosing Inpatient   Does not apply q1800   Assessment: Pt is a 85 y/o male who presents with progressive generalized fatigue  and shortness of breath with infiltrates on x-ray. Furthermore, patient also has elevated troponin. PMH significant for mechanical aortic valve on warfarin. Last dose taken 10/28 _0 . Pharmacy has been consulted to dose warfarin.  Pt's INR today is supratherapeutic at 5.5, unchanged from yesterday despite holding warfarin. Pt received 1 dose of Levaquin, which could have contributed to rise in INR. Hgb/Hct is stable at 11.4/34.6. Plts are wnls. No signs/symptoms of bleeding. Troponins continues to be elevated >12,000.   Pt's home warfarin dose per outpatient anticoag clinic note on 10/8 is 5 mg daily, except 7.5 mg on MWF. However, wife reports that pt actually has been taking 5 mg daily since the last clinic visit.   Goal of Therapy:  INR 2.5-3.5 Monitor platelets by anticoagulation protocol: Yes   Plan:  Hold warfarin today, no reversal necessary since pt is not actively bleeding Monitor daily Pt-INR and watch closely for signs/symptoms of bleeding Monitor CBC periodically Per Cardiology, start heparin infusion when INR < 2.5 F/u cath plan with cardiology  Sherren Kerns, PharmD PGY1 Acute Care Pharmacy Resident 03/02/2019,8:10 AM

## 2019-03-02 NOTE — Progress Notes (Signed)
CRITICAL VALUE ALERT  Critical Value: Lactic Acid 3.0  Date & Time Notied: 1345hrs  Provider Notified: Dr Matilde Haymaker  made aware @1350hrs 

## 2019-03-02 NOTE — Progress Notes (Signed)
Pt agitated and pulled off BiPAP mask. RN placed pt back on high flow [place]. PO Ativan given.

## 2019-03-02 NOTE — Progress Notes (Addendum)
Family Medicine Teaching Service Daily Progress Note Intern Pager: [phone]  Patient name: Juan Li Medical record number: [id number] Date of birth: 03/19/1932 Age: 85 y.o. Gender: male  Primary Care Provider: Zenia Resides, MD Consultants: Cardiology Code Status: DO NOT INTUBATE  Pt Overview and Major Events to Date:  10/29-admit to hospital for shortness of breath generalized fatigue  Assessment and Plan: Juan Li is a 85 y.o. male presenting with progressive generalized fatigue and shortness of breath, found to have infiltrate on x-ray concerning for pneumonia and elevated troponin. PMH is significant for hyperlipidemia, dementia, hypertension, heart failure with preserved ejection fraction, s/p aortic valve replacement on chronic anticoagulation with warfarin, chronic neck pain, CAD/PVD.    Respiratory failure with acute on chronic mixed heart failure -40 to 45% Vital signs stable overnight.  Urine output 250 mils 24 hours.  Net +357.4.  Weight up 1.7 kg from yesterday.  Almost back to admission weight.  Lasix discontinued 10/31.  On exam patient continues to have 8 L high flow nasal cannula oxygen saturations 94%.  Remains tachypneic with rate 23.  Patient reports breathing slightly improved.  Lung sounds diminished.  No elevated JVD appreciated.  Abdomen soft and no lower extremity edema. -Cardiology following                 -Holding Lasix due to increasing creatinine.                 -BiPAP as needed                 -Plan for cardiac cath pending kidney function CAP Afebrile overnight. Currently on HFNC 8L with oxygen saturations 94%. Labs significant for WBC 15.3, lactic acid trending down 3.5> 2.8> 2.4 currently on cefepime and vancomycin IV.  CT chest impressive for bilateral pulmonary infiltrates most likely consistent with multifocal pneumonia.  Also shows emphysematous changes in the lungs. -Continue vancomycin (10/31-) -Continue cefepime (10/31-) -Follow-up  urine culture -Blood cultures 10/29 NGTD, repeat cultures 10/31 NGTD<24hrs -Vital signs per unit -Monitor respiration status closely and maintain oxygen saturations greater than 92%. -Repeat CBC in a.m. -Trend lactic acid   NSTEMI Troponins trending down.  Patient denies any chest pain. -Cardiology following appreciate recommendations.              -Plan was for cardiac catheterization pending kidney                                functions and when more stable              -Continue ASA 81 mg              -New pravastatin 40 mg daily              -Plan for Heparin drip per pharmacy protocol when INR less                    than 2.5 -BMP in a.m -Cardiac monitoring  Mechanical aortic valve on chronic anticoagulation:  INR 5.5.  Target 2.5-3.0 -Pharmacy consult for warfarin monitoring  Hypertension SBP 80-115/55-66 overnight.  Home meds Norvasc 5 mg daily -continue to monitor BP -Continue Metoprolol 25 mg twice daily   AKI on CKD stage III: Acute. Creatinine 2.92 Baseline 1.5 - Monitor daily  Chronic neck pain: Stable. Chronic --Continue to monitor pain control  Dementia: Chronic, stable. Patient alert and orientated to  person place and time. -Monitor for delirium -Reorient as needed  Hyperlipidemia: Chronic, stable. -Continue Pravachol  BPH: Stable. -Continue Flomax and Proscar   Normocytic anemia: Chronic, stable. Asymptomatic.  Hemoglobin 10.8, down from 12.6 on admission -Daily CBCs  FEN/GI: NPO Prophylaxis:  Warfarin  Disposition: OT recommends SNF when cleared by cardiology  Subjective:  No acute events overnight.  Patient denies any chest pain, shortness of breath.  Reports his breathing is a little better but similar to yesterday..  Continues to be on oxygen 8 L nasal cannula.  Objective: Temp:  [97.4 F (36.3 C)] 97.4 F (36.3 C) (10/31 2336) Pulse Rate:  [66-86] 86 (11/01 0914) Resp:  [20-34] 23 (11/01 0914) BP: (97-116)/(52-83) 116/67  (11/01 1057) SpO2:  [91 %-96 %] 94 % (11/01 0914) Weight:  [79.6 kg] 79.6 kg (11/01 0314)   Physical Exam: General: Pleasant 85 year old gentleman in no acute distress Cardiovascular: Mechanical click, regular rate and rhythm, no murmurs appreciated, no elevated JVD appreciated Respiratory: Lung sounds diminished at bases, no crackles no rhonchi noted, mild increased work of breathing. Abdomen: Soft and full, nondistended, nontender, bowel sounds present Extremities: Moving all extremities, no lower extremity edema noted. Laboratory: Recent Labs  Lab 03/01/19 0334 03/02/19 0521 03/03/19 0513  WBC 15.0* 14.2* 15.3*  HGB 12.1* 11.4* 10.8*  HCT 36.3* 34.6* 33.3*  PLT 218 232 260   Recent Labs  Lab 03/01/19 0334 03/02/19 0521 03/02/19 1232 03/03/19 0513  NA 141 139  --  138  K 4.2 4.3  --  3.9  CL 112* 106  --  105  CO2 16* 19*  --  15*  BUN 42* 66*  --  83*  CREATININE 2.13* 2.84*  --  2.92*  CALCIUM 8.8* 9.1  --  8.6*  PROT 6.5 6.5 6.7 6.3*  BILITOT 2.4* 2.8* 2.8* 2.7*  ALKPHOS 45 47 48 44  ALT 20 242* 261* 251*  AST 43* 313* 297* 163*  GLUCOSE 114* 122*  --  94      Imaging/Diagnostic Tests: Dg Chest 1 View  Result Date: 03/02/2019 CLINICAL DATA:  Shortness of breath. EXAM: CHEST  1 VIEW COMPARISON:  Chest x-ray March 02, 2019 FINDINGS: There is infiltrate within the right perihilar region again identified. There may be mild infiltrate in the left retrocardiac region as well. No pneumothorax. No other changes. IMPRESSION: Right-sided infiltrate worrisome for pneumonia. Possible left retrocardiac infiltrate as well. Recommend follow-up imaging to ensure resolution of findings. Electronically Signed   By: Dorise Bullion III M.D   On: 03/02/2019 13:13   Ct Chest Wo Contrast  Result Date: 03/02/2019 CLINICAL DATA:  Shortness of breath. EXAM: CT CHEST WITHOUT CONTRAST TECHNIQUE: Multidetector CT imaging of the chest was performed following the standard protocol  without IV contrast. COMPARISON:  Chest x-ray March 02, 2019 FINDINGS: Cardiovascular: The heart size is borderline. Coronary artery calcifications involve the right and left coronary arteries. Atherosclerotic changes are seen in the nonaneurysmal aorta. Central pulmonary arteries are normal in caliber. Mediastinum/Nodes: Thyroid and esophagus are unremarkable. There is a prominent subcarinal node measuring 12 mm in short axis. There is an 11 mm right pretracheal node and an 11 mm right paratracheal node on image 33. There is also a prominent node to the right of the esophagus at the thoracic inlet measuring 9 mm on series 3, image 18. Small bilateral pleural effusions are identified. No pericardial effusion. Lungs/Pleura: Multi lobar infiltrate is identified, right greater than left, particularly in the right upper lobe  in the bilateral lower lobes. Emphysematous changes are identified in the lungs. No nodules or masses. Upper Abdomen: No acute abnormality. Musculoskeletal: No chest wall mass or suspicious bone lesions identified. IMPRESSION: 1. Bilateral pulmonary infiltrates are most consistent with multifocal pneumonia. Recommend short-term follow-up imaging to ensure resolution. 2. Mild adenopathy in the chest as above. These nodes may be reactive given the findings in the lungs. Recommend short-term follow-up imaging to ensure resolution. 3. Coronary artery calcifications. 4. Atherosclerotic changes in the nonaneurysmal aorta. 5. Emphysematous changes in the lungs. Aortic Atherosclerosis (ICD10-I70.0) and Emphysema (ICD10-J43.9). Electronically Signed   By: Dorise Bullion III M.D   On: 03/02/2019 13:21   US Abdomen Limited Ruq  Result Date: 03/02/2019 CLINICAL DATA:  Trans am in itis. EXAM: ULTRASOUND ABDOMEN LIMITED RIGHT UPPER QUADRANT COMPARISON:  None. FINDINGS: Gallbladder: No gallstones or wall thickening visualized. No sonographic Murphy sign noted by sonographer. Common bile duct: Diameter: 4  mm Liver: No focal lesion identified. Within normal limits in parenchymal echogenicity. Portal vein is patent on color Doppler imaging with normal direction of blood flow towards the liver. Other: None. IMPRESSION: Normal right upper quadrant ultrasound. Electronically Signed   By: Lajean Manes M.D.   On: 03/02/2019 17:40     Dg Chest 1 View  Result Date: 03/02/2019 CLINICAL DATA:  Shortness of breath. EXAM: CHEST  1 VIEW COMPARISON:  Chest x-ray March 02, 2019 FINDINGS: There is infiltrate within the right perihilar region again identified. There may be mild infiltrate in the left retrocardiac region as well. No pneumothorax. No other changes. IMPRESSION: Right-sided infiltrate worrisome for pneumonia. Possible left retrocardiac infiltrate as well. Recommend follow-up imaging to ensure resolution of findings. Electronically Signed   By: Dorise Bullion III M.D   On: 03/02/2019 13:13   Ct Chest Wo Contrast  Result Date: 03/02/2019 CLINICAL DATA:  Shortness of breath. EXAM: CT CHEST WITHOUT CONTRAST TECHNIQUE: Multidetector CT imaging of the chest was performed following the standard protocol without IV contrast. COMPARISON:  Chest x-ray March 02, 2019 FINDINGS: Cardiovascular: The heart size is borderline. Coronary artery calcifications involve the right and left coronary arteries. Atherosclerotic changes are seen in the nonaneurysmal aorta. Central pulmonary arteries are normal in caliber. Mediastinum/Nodes: Thyroid and esophagus are unremarkable. There is a prominent subcarinal node measuring 12 mm in short axis. There is an 11 mm right pretracheal node and an 11 mm right paratracheal node on image 33. There is also a prominent node to the right of the esophagus at the thoracic inlet measuring 9 mm on series 3, image 18. Small bilateral pleural effusions are identified. No pericardial effusion. Lungs/Pleura: Multi lobar infiltrate is identified, right greater than left, particularly in the right  upper lobe in the bilateral lower lobes. Emphysematous changes are identified in the lungs. No nodules or masses. Upper Abdomen: No acute abnormality. Musculoskeletal: No chest wall mass or suspicious bone lesions identified. IMPRESSION: 1. Bilateral pulmonary infiltrates are most consistent with multifocal pneumonia. Recommend short-term follow-up imaging to ensure resolution. 2. Mild adenopathy in the chest as above. These nodes may be reactive given the findings in the lungs. Recommend short-term follow-up imaging to ensure resolution. 3. Coronary artery calcifications. 4. Atherosclerotic changes in the nonaneurysmal aorta. 5. Emphysematous changes in the lungs. Aortic Atherosclerosis (ICD10-I70.0) and Emphysema (ICD10-J43.9). Electronically Signed   By: Dorise Bullion III M.D   On: 03/02/2019 13:21   US Abdomen Limited Ruq  Result Date: 03/02/2019 CLINICAL DATA:  Trans am in itis. EXAM: ULTRASOUND ABDOMEN  LIMITED RIGHT UPPER QUADRANT COMPARISON:  None. FINDINGS: Gallbladder: No gallstones or wall thickening visualized. No sonographic Murphy sign noted by sonographer. Common bile duct: Diameter: 4 mm Liver: No focal lesion identified. Within normal limits in parenchymal echogenicity. Portal vein is patent on color Doppler imaging with normal direction of blood flow towards the liver. Other: None. IMPRESSION: Normal right upper quadrant ultrasound. Electronically Signed   By: Lajean Manes M.D.   On: 03/02/2019 17:40    Carollee Leitz, MD 03/03/2019, 12:31 PM PGY-1, [place] Intern pager: [phone], text pages welcome

## 2019-03-02 NOTE — Progress Notes (Signed)
Family Medicine Teaching Service Daily Progress Note Intern Pager: [phone]  Patient name: Juan Li Medical record number: [id number] Date of birth: 10-08-1931 Age: 85 y.o. Gender: male  Primary Care Provider: Zenia Resides, MD Consultants: Cardiology Code Status: DO NOT INTUBATE  Pt Overview and Major Events to Date:  10/29-admit for NSTEMI, CAP, heart failure  Assessment and Plan: Juan Li is a 85 y.o. male here with heart failure and NSTEMI. PMH is significant for hyperlipidemia, dementia, hypertension, heart failure with preserved ejection fraction, s/p aortic valve replacement on chronic anticoagulation with warfarin, chronic neck pain, CAD/PVD.   Heart failure with reduced ejection fraction and diastolic dysfunction Urine output is net -1.1 L since hospital admission.  Weight is down 1.5 kg from hospital admission.  Increased oxygen requirement overnight leading to BiPAP.  Physical exam this morning euvolemic with no lower extremity edema, minimal rales noted in lower lung fields no significant JVD.  Increased oxygen requirement overnight progressing from high flow nasal cannula to BiPAP.  Off of BiPAP this morning and now breathing on 9 L nasal cannula.  No obvious fluid overload on exam.  Worsening creatinine indicates he may be intravascularly dry.  Heart failure and fluid overload are certainly contributing to his poor respiratory status although it is not clear that we can continue with aggressive diuresis at this time.  Low suspicion for PE is contributing to his respiratory status given his supratherapeutic INR. -Cardiology following, appreciate recommendations -Maintain oxygen saturations greater than 92% -Lasix held per cardiology -Metoprolol tartrate 25 mg twice daily -Holding ACE/ARB due to poor kidney function  AKI on CKD Baseline creatinine 1.4-1.6.  Creatinine on admission 2.1.  Creatinine today 2.8 elevated from 2.1.  Likely secondary to aggressive  diuresis.  Plan to hold Lasix today. -Monitor creatinine -Avoid nephrotoxic medication  NSTEMI Cardiology currently following.  Plans to bring to Cath Lab following normalization of kidney function and control of exacerbated heart failure. -Plan for heparin drip once INR falls below 2.5 -S/p full dose aspirin, ASA 81 mg -Metoprolol as above  Community-acquired pneumonia Likely a minor component contributing to poor respiratory status.  Received 1 dose of Levaquin on 10/29.  We will continue a total of 5 days of antibiotic therapy with azithromycin. -Azithromycin 500 (10/31-11/2) -Add ceftriaxone 1 g daily (10/31-11/2)  Mechanical aortic valve on chronic anticoagulation:  INR 5.5.  Home medication warfarin 5 mg daily. -Pharmacy consult for warfarin monitoring  Hypertension -Monitor blood pressure  AKI on CKD stage III: Acute. Worsening kidney function this morning.  Creatinine 2.8 from 2.1.  Likely secondary to aggressive diuresis regimen.  Will consider decreasing diuretics today. -Follow-up cardiology recommendations -BMP  Chronic neck pain: Stable. -Continue to monitor.  Dementia: Chronic, stable. Lives with his wife as primary caretaker.  Alert and oriented on exam, reasonable historian.  Takes Namenda daily. -Delirium precautions -Will touch base with wife to clarify any additional history  Hyperlipidemia: Chronic, stable. - Pravachol  BPH: Stable. - Flomax  - Proscar  Normocytic anemia: Chronic, stable. Asymptomatic.  Hemoglobin 12.1. -Continue to monitor  FEN/GI: NPO Prophylaxis:  Heparin drip perform once INR is below 2.5  Disposition: 2-4 additional days of hospitalization anticipated prior to discharge.  Subjective:  Changes in respiratory status overnight requiring high flow nasal cannula followed by BiPAP.  Breathing with mild respiratory distress on 9 L nasal cannula on exam this morning.  He denies chest pain or stomach pain this morning.  He  continues to note to significant  shortness of breath.   Objective: Temp:  [97.3 F (36.3 C)-98.7 F (37.1 C)] 98.2 F (36.8 C) (10/31 0429) Pulse Rate:  [71-105] 71 (10/31 0429) Resp:  [18-24] 18 (10/31 0429) BP: (90-135)/(55-71) 105/61 (10/31 0429) SpO2:  [91 %-98 %] 96 % (10/31 0429) Weight:  [77.9 kg] 77.9 kg (10/31 0424)  Physical Exam: General: Alert and cooperative and appears to be in no acute distress.  Sitting up in bed, eating breakfast, struggling to lift a straw into his coffee cup. HEENT: Difficult to assess JVD. Cardio: Normal S1, loud S2, no S3 or S4. Rhythm is regular. No murmurs or rubs.   Pulm: Breathing with mild respiratory distress on 9 L nasal cannula.  Able to complete only short sentences.  Minimal rales noted in lower fields.  No wheezing/stridor. Abdomen: Bowel sounds normal. Abdomen soft and non-tender.  Extremities: No peripheral edema. Warm/ well perfused.  Strong radial pulse. Neuro: Cranial nerves grossly intact   Laboratory: Recent Labs  Lab 02/28/19 0920 03/01/19 0334 03/02/19 0521  WBC 13.4* 15.0* 14.2*  HGB 12.6* 12.1* 11.4*  HCT 38.5* 36.3* 34.6*  PLT 205 218 232   Recent Labs  Lab 02/28/19 0920 03/01/19 0334 03/02/19 0521  NA 138 141 139  K 4.6 4.2 4.3  CL 111 112* 106  CO2 15* 16* 19*  BUN 37* 42* 66*  CREATININE 2.10* 2.13* 2.84*  CALCIUM 8.9 8.8* 9.1  PROT 6.8 6.5 6.5  BILITOT 2.7* 2.4* 2.8*  ALKPHOS 51 45 47  ALT 20 20 242*  AST 50* 43* 313*  GLUCOSE 196* 114* 122*      Imaging/Diagnostic Tests: No results found.  Matilde Haymaker, MD 03/02/2019, 7:46 AM PGY-2, [place] Intern pager: [phone], text pages welcome

## 2019-03-02 NOTE — Progress Notes (Signed)
ABG obtained and sent to lab. °

## 2019-03-02 NOTE — Progress Notes (Addendum)
Mr. Juan Li has had an increasing oxygen requirement throughout the day.  His labs this morning show an increasing transaminitis.  There is concern for multisystem organ failure and metabolic encephalopathy.  He was seen at bedside this afternoon in order to monitor him closely.  S) he reports subjective worsening compared to this morning.  He says that he felt significantly improved coming off of BiPAP this morning was able to eat breakfast but that he feels that he really went downhill after finishing his breakfast.  He now reports he is having worsening shortness of breath and significant back pain.  The back pain appears to be a chronic issue but is particularly bothersome at present.  He specifically denies chest pain and abdominal pain.   O) General: Alert and oriented x4, sitting up in his chair moderately labored breathing with high flow nasal cannula in place.  He appears distressed and sad please help me. HEENT: JVD noted in the left side of his neck at about the angle of his jaw. Pulmonary: Mildly labored breathing on 8 L high flow nasal cannula.  Rales noted in lower and middle fields.  No wheezing.  Good air movement. Abdomen: Moderately distended, not taut.  Soft, mildly tender to palpation.  Bowel sounds normal. Extremities: Warm, well-perfused, trace lower extremity edema.  Weak radial pulse.  A/P) Mr. Juan Li is an 85 year old gentleman who was admitted for NSTEMI, heart failure possible CAP.  His respiratory status is worsened throughout the day is now requiring 8 L high flow nasal cannula.  He is now showing multisystem organ failure and possible acute metabolic encephalopathy. Differential for his worsening respiratory status includes: NSTEMI, decompensated heart failure, CAP, other source of infection, PE.  NSTEMI -Cards following, no cath until renal function improves and his overall status stabilizes  Decompensated heart failure, grade 3 diastolic dysfunction with mildly  reduced EF (40-45%) -Holding diuresis due to AKI -Cardiology following, appreciate recommendations  CAP Previously treated with Levaquin and azithromycin. -DC azithromycin -Start vancomycin and cefepime -Repeat CXR  Occult infection -Repeat blood cultures, urine cultures -Broaden antibiotics as above  Concern for PE Low suspicion due to supratherapeutic INR on admission. -Follow-up CTA chest without contrast  Elevated transaminases No abdominal pain on exam.  Unclear etiology at this time.  Low suspicion for cholecystitis. -Ultrasound right upper quadrant  DNI status DO NOT INTUBATE status was discussed.  Mr. Juan Li noted that he would like to speak with his wife who is coming into the hospital as we speak. -CODE STATUS not changed -We will follow-up with wife and patient as soon as possible  CCM was made aware of this patient's ongoing decline.  His case was discussed with Dr. Loanne Drilling.  Dr. Loanne Drilling notes that there is likely little that CCM can do due to Mr. Juan Li DNI status.  CCM will await further notification from primary team if his status worsens.

## 2019-03-02 NOTE — Progress Notes (Signed)
Patient resting comfortably on [place]ter high flow nasal cannula. No respiratory distress noted. BIPAP is not needed at this time. BIPAP is at bedside. RT will monitor as needed.

## 2019-03-02 NOTE — Progress Notes (Signed)
INR is 5.5 again this morning. Will pass on to day shift RN and continue to monitor.

## 2019-03-02 NOTE — Progress Notes (Signed)
Pt is tachypneic with labored breathing on 6L high flow [place]. Breath sounds diminished. O2 deliverance increased to 9L initially, then 11L. BiPAP placed back on pt. RT called due to alarms. MD paged. Will continue to monitor.

## 2019-03-02 NOTE — Progress Notes (Signed)
Pharmacy Antibiotic Note  Juan Li is a 85 y.o. male with PMH of HLD, dementia, HTN, Heart failure with preserved ejection fraction, s/p aortic valve replacement on chronic anticoagulation who presents with progressive generalized fatigue and shortness of breath with infiltrates on x-ray. There is concern for sepsis. Pharmacy has been consulted to dose vancomycin and cefepime.  Patient is afebrile with temp of 97.3. WBC is elevated at 14.2. Pt remains hypotensive at 97/60. Scr has increased from 2.10>2.13>2.84  Plan: Cefepime 2g IV q24 hours Vancomycin 1750 mg IV x 1 as Loading dose Will pulse dose vancomycin due to AKI Monitor renal function, WBC, temp, and clinical status  Height: 5\' 6"  (167.6 cm) Weight: 171 lb 11.8 oz (77.9 kg) IBW/kg (Calculated) : 63.8  Temp (24hrs), Avg:97.8 F (36.6 C), Min:97.3 F (36.3 C), Max:98.3 F (36.8 C)  Recent Labs  Lab 02/28/19 0920 02/28/19 1830 02/28/19 2109 02/28/19 2237 03/01/19 0334 03/02/19 0521  WBC 13.4*  --   --   --  15.0* 14.2*  CREATININE 2.10*  --   --   --  2.13* 2.84*  LATICACIDVEN  --  2.1* 1.8 1.9  --   --     Estimated Creatinine Clearance: 18.7 mL/min (A) (by C-G formula based on SCr of 2.84 mg/dL (H)).    No Known Allergies  Antimicrobials this admission: LVQ 10/29 x 1 Azithromycin 10/31 x 1 Cefepime 10/31 >> Vancomycin 10/31 >>  10/29 UCx >> Multiple species - likely contaminated 10/29 BCx >> NG x 2 10/29 COVID >> negative x2  Thank you for allowing pharmacy to be a part of this patient's care.  Sherren Kerns, PharmD PGY1 Acute Care Pharmacy Resident 03/02/2019 12:48 PM

## 2019-03-02 NOTE — Progress Notes (Signed)
Progress Note  Patient Name: Juan Li Date of Encounter: 03/02/2019  Primary Cardiologist: Nelva Bush, MD   Subjective   Still noticeable increased work of breathing however he states that he is breathing a little bit better.  Denies any chest pain.    Inpatient Medications    Scheduled Meds: . aspirin EC  81 mg Oral Daily  . atorvastatin  40 mg Oral q1800  . azithromycin  500 mg Oral Daily  . buPROPion  75 mg Oral Daily  . furosemide  80 mg Intravenous BID  . metoprolol tartrate  25 mg Oral BID  . Warfarin - Pharmacist Dosing Inpatient   Does not apply q1800   Continuous Infusions:  PRN Meds: acetaminophen **OR** acetaminophen, nitroGLYCERIN   Vital Signs    Vitals:   03/02/19 0330 03/02/19 0424 03/02/19 0429 03/02/19 0828  BP: (!) 117/59  105/61 115/73  Pulse: 79  71 74  Resp:   18 18  Temp:   98.2 F (36.8 C)   TempSrc:   Oral   SpO2:   96% 95%  Weight:  77.9 kg    Height:        Intake/Output Summary (Last 24 hours) at 03/02/2019 0912 Last data filed at 03/02/2019 0234 Gross per 24 hour  Intake 877 ml  Output 1495 ml  Net -618 ml   Last 3 Weights 03/02/2019 03/01/2019 02/28/2019  Weight (lbs) 171 lb 11.8 oz 178 lb 2.1 oz 175 lb  Weight (kg) 77.9 kg 80.8 kg 79.379 kg      Telemetry    What is labeled atrial fibrillation on telemetry monitor is sinus rhythm with PACs- Personally Reviewed  ECG    Sinus rhythm ST depression ischemia- Personally Reviewed  Physical Exam   GEN: No acute distress.  Elderly Neck: No JVD Cardiac: RRR, no murmurs, rubs, or gallops.  Respiratory: Clear to auscultation bilaterally.  Mildly increased work of breathing GI: Soft, nontender, non-distended  MS: No edema; No deformity. Neuro:  Nonfocal  Psych: Normal affect   Labs    High Sensitivity Troponin:   Recent Labs  Lab 02/28/19 2237 03/01/19 0334 03/01/19 0453 03/01/19 0658 03/01/19 1554  TROPONINIHS 12,409* 12,000* 11,676* 12,126* 9,409*       Chemistry Recent Labs  Lab 02/28/19 0920 03/01/19 0334 03/02/19 0521  NA 138 141 139  K 4.6 4.2 4.3  CL 111 112* 106  CO2 15* 16* 19*  GLUCOSE 196* 114* 122*  BUN 37* 42* 66*  CREATININE 2.10* 2.13* 2.84*  CALCIUM 8.9 8.8* 9.1  PROT 6.8 6.5 6.5  ALBUMIN 3.7 3.3* 3.1*  AST 50* 43* 313*  ALT 20 20 242*  ALKPHOS 51 45 47  BILITOT 2.7* 2.4* 2.8*  GFRNONAA 28* 27* 19*  GFRAA 32* 32* 22*  ANIONGAP 12 13 14      Hematology Recent Labs  Lab 02/28/19 0920 03/01/19 0334 03/02/19 0521  WBC 13.4* 15.0* 14.2*  RBC 3.90* 3.78* 3.61*  HGB 12.6* 12.1* 11.4*  HCT 38.5* 36.3* 34.6*  MCV 98.7 96.0 95.8  MCH 32.3 32.0 31.6  MCHC 32.7 33.3 32.9  RDW 14.6 14.6 14.6  PLT 205 218 232    BNP Recent Labs  Lab 02/28/19 1840  BNP 1,533.4*     DDimer  Recent Labs  Lab 02/28/19 0947  DDIMER <0.27     Radiology    Dg Chest Port 1 View  Result Date: 02/28/2019 CLINICAL DATA:  Shortness of breath EXAM: PORTABLE CHEST 1 VIEW COMPARISON:  07/28/2017 FINDINGS: Prior median sternotomy. Stable cardiomediastinal contours. Patchy right upper lobe airspace opacity. Mildly increased interstitial markings bilaterally. No pleural effusion or pneumothorax. IMPRESSION: Patchy right upper lobe airspace opacity suspicious for pneumonia. Electronically Signed   By: Davina Poke M.D.   On: 02/28/2019 09:35    Cardiac Studies   EF 40 to 45%  Patient Profile     85 y.o. male with the following medical issues see below:  Assessment & Plan    Acute respiratory failure with acute on chronic systolic heart failure and diastolic heart failure -EF 40 to 45% with grade 3 restrictive diastolic dysfunction high LV filling pressures. -On 10/30, on Lasix 80 mg IV twice daily. -Since his creatinine has increased from 2.1 up to 2.8 with increasing BUN as well, I will hold his Lasix today. -BiPAP as needed for improved ventilation. -Plan on cardiac catheterization next week pending renal  recovery, we are currently going in the wrong direction. -BNP 8000  Non-ST elevation myocardial infarction -Associated cardiomyopathy, heart failure, elevated high-sensitivity troponin 12,000, starting to trend downward -Interestingly, denied any chest discomfort. -Ill with multisystem organ failure.  Mechanical aortic valve -Chronic Coumadin. -Holding Coumadin. -Transition to heparin once INR less than 2.5.  Acute kidney injury on chronic kidney disease stage III -Baseline creatinine around 1.6.  Currently increasing to 2.8 with BUN of 66 following aggressive diuresis.  Avoiding nephrotoxic agents.  We will go ahead and hold his Lasix today.  Dementia -Stable.  Hyperlipidemia -On atorvastatin 40 mg-LDL 99  High risk individual with multisystem organ failure.     For questions or updates, please contact [place] Please consult [email or web address] for contact info under        Signed, Candee Furbish, MD  03/02/2019, 9:12 AM  '

## 2019-03-03 ENCOUNTER — Encounter (HOSPITAL_COMMUNITY): Payer: Self-pay | Admitting: Family Medicine

## 2019-03-03 DIAGNOSIS — R710 Precipitous drop in hematocrit: Secondary | ICD-10-CM | POA: Diagnosis not present

## 2019-03-03 DIAGNOSIS — F0390 Unspecified dementia without behavioral disturbance: Secondary | ICD-10-CM

## 2019-03-03 DIAGNOSIS — Z66 Do not resuscitate: Secondary | ICD-10-CM

## 2019-03-03 DIAGNOSIS — N179 Acute kidney failure, unspecified: Secondary | ICD-10-CM

## 2019-03-03 DIAGNOSIS — Z515 Encounter for palliative care: Secondary | ICD-10-CM

## 2019-03-03 DIAGNOSIS — J439 Emphysema, unspecified: Secondary | ICD-10-CM

## 2019-03-03 DIAGNOSIS — Z7901 Long term (current) use of anticoagulants: Secondary | ICD-10-CM

## 2019-03-03 HISTORY — DX: Emphysema, unspecified: J43.9

## 2019-03-03 LAB — COMPREHENSIVE METABOLIC PANEL
ALT: 251 U/L — ABNORMAL HIGH (ref 0–44)
AST: 163 U/L — ABNORMAL HIGH (ref 15–41)
Albumin: 3.1 g/dL — ABNORMAL LOW (ref 3.5–5.0)
Alkaline Phosphatase: 44 U/L (ref 38–126)
Anion gap: 18 — ABNORMAL HIGH (ref 5–15)
BUN: 83 mg/dL — ABNORMAL HIGH (ref 8–23)
CO2: 15 mmol/L — ABNORMAL LOW (ref 22–32)
Calcium: 8.6 mg/dL — ABNORMAL LOW (ref 8.9–10.3)
Chloride: 105 mmol/L (ref 98–111)
Creatinine, Ser: 2.92 mg/dL — ABNORMAL HIGH (ref 0.61–1.24)
GFR calc Af Amer: 22 mL/min — ABNORMAL LOW (ref >60)
GFR calc non Af Amer: 19 mL/min — ABNORMAL LOW (ref >60)
Glucose, Bld: 94 mg/dL (ref 70–99)
Potassium: 3.9 mmol/L (ref 3.5–5.1)
Sodium: 138 mmol/L (ref 135–145)
Total Bilirubin: 2.7 mg/dL — ABNORMAL HIGH (ref 0.3–1.2)
Total Protein: 6.3 g/dL — ABNORMAL LOW (ref 6.5–8.1)

## 2019-03-03 LAB — SODIUM, URINE, RANDOM: Sodium, Ur: 21 mmol/L

## 2019-03-03 LAB — URINALYSIS, ROUTINE W REFLEX MICROSCOPIC
Bilirubin Urine: NEGATIVE
Glucose, UA: NEGATIVE mg/dL
Hgb urine dipstick: NEGATIVE
Ketones, ur: NEGATIVE mg/dL
Nitrite: NEGATIVE
Protein, ur: NEGATIVE mg/dL
Specific Gravity, Urine: 1.015 (ref 1.005–1.030)
pH: 6 (ref 5.0–8.0)

## 2019-03-03 LAB — CBC
HCT: 33.3 % — ABNORMAL LOW (ref 39.0–52.0)
Hemoglobin: 10.8 g/dL — ABNORMAL LOW (ref 13.0–17.0)
MCH: 31.6 pg (ref 26.0–34.0)
MCHC: 32.4 g/dL (ref 30.0–36.0)
MCV: 97.4 fL (ref 80.0–100.0)
Platelets: 260 10*3/uL (ref 150–400)
RBC: 3.42 MIL/uL — ABNORMAL LOW (ref 4.22–5.81)
RDW: 14.7 % (ref 11.5–15.5)
WBC: 15.3 10*3/uL — ABNORMAL HIGH (ref 4.0–10.5)
nRBC: 0 % (ref 0.0–0.2)

## 2019-03-03 LAB — URINE CULTURE: Culture: <10000 — AB

## 2019-03-03 LAB — PROTIME-INR
INR: 5.5 (ref 0.8–1.2)
Prothrombin Time: 48.9 seconds — ABNORMAL HIGH (ref 11.4–15.2)

## 2019-03-03 LAB — LACTIC ACID, PLASMA
Lactic Acid, Venous: 2.6 mmol/L (ref 0.5–1.9)
Lactic Acid, Venous: 2 mmol/L (ref 0.5–1.9)

## 2019-03-03 LAB — CREATININE, URINE, RANDOM: Creatinine, Urine: 142.28 mg/dL

## 2019-03-03 MED ORDER — LEVALBUTEROL HCL 0.63 MG/3ML IN NEBU
0.6300 mg | INHALATION_SOLUTION | Freq: Three times a day (TID) | RESPIRATORY_TRACT | Status: DC
  Administered 2019-03-03 – 2019-03-05 (×6): 0.63 mg via RESPIRATORY_TRACT
  Filled 2019-03-03 (×7): qty 3

## 2019-03-03 MED ORDER — MORPHINE SULFATE (PF) 2 MG/ML IV SOLN
2.0000 mg | INTRAVENOUS | Status: DC | PRN
  Administered 2019-03-03 – 2019-03-04 (×2): 2 mg via INTRAVENOUS
  Filled 2019-03-03 (×2): qty 1

## 2019-03-03 MED ORDER — LEVALBUTEROL HCL 0.63 MG/3ML IN NEBU: 0.6300 mg | INHALATION_SOLUTION | Freq: Four times a day (QID) | RESPIRATORY_TRACT | Status: DC | PRN

## 2019-03-03 MED ORDER — ONDANSETRON HCL 4 MG/2ML IJ SOLN
4.0000 mg | Freq: Four times a day (QID) | INTRAMUSCULAR | Status: DC | PRN
  Administered 2019-03-03: 4 mg via INTRAVENOUS
  Filled 2019-03-03: qty 2

## 2019-03-03 MED ORDER — OXYCODONE HCL 5 MG PO TABS
5.0000 mg | ORAL_TABLET | ORAL | Status: DC | PRN
  Administered 2019-03-04: 5 mg via ORAL
  Filled 2019-03-03: qty 1

## 2019-03-03 NOTE — Evaluation (Signed)
Occupational Therapy Evaluation Patient Details Name: Juan Li MRN: [id number] DOB: [birth year] Today's Date: 03/03/2019    History of Present Illness 85 y.o. male presenting with progressive generalized fatigue and shortness of breath, found to have infiltrate on x-ray concerning for pneumonia and elevated troponin. Found with acute hypoxemic resp failure, worsened by NSTEMI. PMH is significant for hyperlipidemia, dementia, hypertension, heart failure with preserved ejection fraction, s/p aortic valve replacement on chronic anticoagulation with warfarin, chronic neck pain, CAD/PVD.   Clinical Impression   PTA patient reports independent ADLs, limited IADLs and driving.  Admitted for above and limited by problem list below, including decreased activity tolerance, generalized weakness.  Eval limited to BED LEVEL ONLY, rolling with mod- max assist to change soiled pads under patient, anticipate mod assist for grooming, mod-max for UB ADLs and total assist for LB ADLs.  BP 116/60 (60), HR 79-83, RR up to 31, and SpO2 on 8L HFNC with max cueing for PLB at rest 90-92 and dropping to 82% with limited mobility rolling in bed. Based on performance today, patient will benefit from further OT services while admitted and after dc at SNF level in order to optimize return to PLOF and decrease burden of care upon return home.  Will follow acutely.     Follow Up Recommendations  SNF;Supervision/Assistance - 24 hour    Equipment Recommendations  Other (comment)(TBD at next venue of care)    Recommendations for Other Services Other (comment)(palliative care )     Precautions / Restrictions Precautions Precautions: Fall Precaution Comments: watch O2 Restrictions Weight Bearing Restrictions: No      Mobility Bed Mobility Overal bed mobility: Needs Assistance Bed Mobility: Rolling Rolling: Mod assist;Max assist         General bed mobility comments: limited to bed level, mod-max assist to  roll in order to change pads underneath patient (soiled); and total assist using trendelenburg to pull up into bed  Transfers                 General transfer comment: deferred     Balance                                           ADL either performed or assessed with clinical judgement   ADL Overall ADL's : Needs assistance/impaired     Grooming: Minimal assistance;Bed level   Upper Body Bathing: Moderate assistance;Bed level   Lower Body Bathing: Total assistance;Bed level   Upper Body Dressing : Total assistance;Bed level   Lower Body Dressing: Total assistance;Bed level     Toilet Transfer Details (indicate cue type and reason): deferred          Functional mobility during ADLs: Maximal assistance(limited to bed level) General ADL Comments: pt limited by generalized weakness, decreased activity tolerance      Vision   Vision Assessment?: No apparent visual deficits     Perception     Praxis      Pertinent Vitals/Pain Pain Assessment: No/denies pain     Hand Dominance Right   Extremity/Trunk Assessment Upper Extremity Assessment Upper Extremity Assessment: Generalized weakness(noted L weaker than R )   Lower Extremity Assessment Lower Extremity Assessment: Defer to PT evaluation       Communication Communication Communication: No difficulties   Cognition Arousal/Alertness: Awake/alert Behavior During Therapy: WFL for tasks assessed/performed Overall Cognitive Status: History of cognitive impairments -  at baseline Area of Impairment: Orientation;Following commands;Problem solving                 Orientation Level: Disoriented to;Situation     Following Commands: Follows one step commands with increased time;Follows one step commands inconsistently     Problem Solving: Slow processing;Difficulty sequencing;Requires verbal cues General Comments: pt with hx of dementia, oriented x 3 but not to situation; follows  commands with increased time    General Comments  pt on 8L HFNC during session, max cueing for PLB and fatigue easily     Exercises     Shoulder Instructions      Home Living Family/patient expects to be discharged to:: Private residence Living Arrangements: Spouse/significant other Available Help at Discharge: Family;Available 24 hours/day Type of Home: House Home Access: Stairs to enter [employer] of Steps: 3 Entrance Stairs-Rails: None Home Layout: Two level   Alternate Level Stairs-Rails: Can reach both Bathroom Shower/Tub: Occupational psychologist: Standard     Home Equipment: Cane - single point;Walker - 4 wheels;Shower seat          Prior Functioning/Environment Level of Independence: Independent        Comments: household ambulator, independent ADLs but limited IADLs (does drive)         OT Problem List: Decreased strength;Decreased activity tolerance;Decreased knowledge of use of DME or AE;Decreased knowledge of precautions;Cardiopulmonary status limiting activity      OT Treatment/Interventions: Self-care/ADL training;DME and/or AE instruction;Therapeutic exercise;Therapeutic activities;Patient/family education;Balance training;Cognitive remediation/compensation    OT Goals(Current goals can be found in the care plan section) Acute Rehab OT Goals Patient Stated Goal: to feel better OT Goal Formulation: With patient Time For Goal Achievement: 03/17/19 Potential to Achieve Goals: Good  OT Frequency: Min 2X/week   Barriers to D/C:            Co-evaluation              AM-PAC OT "6 Clicks" Daily Activity     Outcome Measure Help from another person eating meals?: A Lot Help from another person taking care of personal grooming?: A Lot Help from another person toileting, which includes using toliet, bedpan, or urinal?: Total Help from another person bathing (including washing, rinsing, drying)?: A Lot Help from another  person to put on and taking off regular upper body clothing?: A Lot Help from another person to put on and taking off regular lower body clothing?: Total 6 Click Score: 10   End of Session Equipment Utilized During Treatment: Oxygen(8L HFNC) Nurse Communication: Mobility status;Precautions  Activity Tolerance: Patient limited by fatigue Patient left: in bed;with call bell/phone within reach;with bed alarm set;Other (comment)(resp in room)  OT Visit Diagnosis: Other abnormalities of gait and mobility (R26.89);Muscle weakness (generalized) (M62.81)                Time: [id number] OT Time Calculation (min): 25 min Charges:  OT General Charges $OT Visit: 1 Visit OT Evaluation $OT Eval Moderate Complexity: 1 Mod OT Treatments $Self Care/Home Management : 8-22 mins  Delight Stare, OT Acute Rehabilitation Services Pager [phone] Office [phone]    Delight Stare 03/03/2019, 8:46 AM

## 2019-03-03 NOTE — Progress Notes (Signed)
Patient has change in status, requires more oxygen, will place him back on BiPap per pulmonary

## 2019-03-03 NOTE — Consult Note (Signed)
NAME:  Juan Li, MRN:  [id number], DOB:  1932/01/27, LOS: 3 ADMISSION DATE:  02/28/2019, CONSULTATION DATE: 03/03/2019  REFERRING MD:  Dr. Worthy Flank, CHIEF COMPLAINT:  SOB   Brief History   This is an 85 year old gentleman with history of combined systolic diastolic heart failure presents in respiratory failure and renal failure unresponsive to diuretic.  Pulmonary was consulted for discussion of ICU upgrade with intubation, mechanical life support and potentially the need for dialysis support.  History of present illness    85 yo M, PMH HFpEF, chronic diastolic heart failure, CAD, AVR, on coumadin. He was at the beach last week. He got back on Monday. Per the wife has increased SOB since Monday. He was sitting in the car to come to an appt and they had to call 911 because he couldn't breath.  I had a long discussion with the patient's wife via phone.  Prior to this I spoke with the patient at bedside regarding his wishes.  He stated that he just did not want to be in pain and did not want to feel like he could not breathe.  He also did not want to put the BiPAP mask on however in the room he while visiting with him he had continued respiratory decline requiring increasing his nasal cannula high flow Salter catheter to 15 L.  Sats dropped to the low 80s.  Respiratory therapy was called and patient was transitioned to BiPAP until I was able to call and speak with the patient's wife.  Patient did not want to use BiPAP.  On the phone with the patient's wife we discussed the risk benefits and alternatives of proceeding with endotracheal intubation to include mechanical ventilation, life support the potential need for catheter placement for vasopressor support as well as potential need for catheter placement regarding dialysis if we were unable to remove fluid as he has not making a significant amount of urine output.  The wife states that the patient has a sister who is was on dialysis and she is  unsure if he would want that.  Therefore at the conclusion of our discussion she believes that the next best step would be to continue to do everything we can to support but do not do any heroic measures.  Therefore we will not make any plans for endotracheal intubation or CPR in the event of cardiac arrest.  CODE STATUS in the chart was changed to reflect this.  The patient's nurse also spoke with the patient's wife on the phone to confirm this discussion.    Past Medical History   Past Medical History:  Diagnosis Date  . (HFpEF) heart failure with preserved ejection fraction ([place]) 06/24/2017  . Acute gout involving toe of left foot 12/16/2016  . AKI (acute kidney injury) ([place]) 07/29/2017  . Arthritis 02-15-12   osteoarthritis,right knee, pain right wrist.,  . Arthritis    "hands, legs" (12/01/2016)  . BACK PAIN, LOW 06/29/2006   Qualifier: Diagnosis of  By: Benna Dunks    . Bradycardia 10/06/2013  . Carotid artery disease ([place])    Carotid US 1/18: R 40-59; L 80-99  . Carpal tunnel syndrome 06/29/2006   Qualifier: Diagnosis of  By: Benna Dunks    . Chronic diastolic CHF (congestive heart failure) ([place]) 08/21/2012  . Chronic lower back pain   . Complication of anesthesia 02-15-12   very confused, and very slow to awaken after anesthesia  . Coronary artery disease 02-15-12   Heart valve replaced   .  Dementia ([place])   . Depression   . Emphysema of lung ([place]) 03/03/2019  . Gait abnormality 09/02/2011  . GASTROESOPHAGEAL REFLUX, NO ESOPHAGITIS 06/29/2006   Qualifier: Diagnosis of  By: Benna Dunks    . Gouty arthropathy 06/29/2006   Mild - only one or two episodes.      Marland Kitchen HEART VALVE REPLACEMENT, HX OF 12/31/2008   Qualifier: Diagnosis of  By: Olevia Perches, MD, Glenetta Hew   . History of lacunar cerebrovascular accident (CVA) 03/02/2019   Brain MRI 09/10/17: Chronic ischemic foci in the right cerebellar hemisphere, bilateral basal ganglia consistent with small lacunar infarctions   . Hx of  excision of lamina of cervical vertebra for decompression of spinal cord 08/30/2017  . Hypercholesterolemia 02-15-12  . Hypertension   . LBBB (left bundle branch block) 02/28/2019  . Memory loss   . PAD (peripheral artery disease) ([place])   . ROTATOR CUFF TENDONITIS 06/29/2006   Qualifier: Diagnosis of  By: Benna Dunks    . S/P AVR (aortic valve replacement)    Mechanical St. Jude AVR // Echo 12/17: Moderate LVH, EF 60-65, normal wall motion, grade 1 diastolic dysfunction, mechanical AVR okay with mean gradient 8 mmHg, MAC, mild mitral stenosis (mean gradient 2 mmHg), mild MR, mild RAE, PASP 25  . Skin cancer 06/29/2006   Multiple areas of skin cancer change.  Sees Dermatologist at the skin surgery center.  See scanned note of 12/12/16.  Four biopsies. 1. AK 2. Squamus cell 3. Squamus cell 4. Basal cell infiltrating.  Given frequency and complexity, needs to stay with derm.     . TOBACCO USE, QUIT 06/29/2006   Qualifier: Diagnosis of  By: Benna Dunks    . Unspecified vitamin D deficiency 11/30/2012   [facility] at [facility] showed level=18   . Urinary frequency      Significant Hospital Events    Consults:  Cardiology Procedures:   Significant Diagnostic Tests:   CT chest: CT chest was read as having bilateral multifocal infiltrates consistent with pneumonia however patient's clinical syndrome is not necessarily consistent with this.  He also has evidence of emphysema. This may very well represent pulmonary edema in the setting of patient with emphysematous lung changes. The patient's images have been independently reviewed by me.    Micro Data:  Blood cultures no growth to date Urine cultures no significant growth.  Antimicrobials:  Cefepime Vancomycin  Interim history/subjective:  Please see HPI above  Objective   Blood pressure 114/72, pulse 93, temperature (!) 97.4 F (36.3 C), temperature source Oral, resp. rate 17, height _0  (1.676 m), weight 79.6 kg, SpO2 91 %.         Intake/Output Summary (Last 24 hours) at 03/03/2019 1310 Last data filed at 03/03/2019 1058 Gross per 24 hour  Intake 727.36 ml  Output 450 ml  Net 277.36 ml   Filed Weights   03/01/19 0500 03/02/19 0424 03/03/19 0314  Weight: 80.8 kg 77.9 kg 79.6 kg    Examination: General: Elderly male, respiratory distress sitting up in bed, tachypneic HENT: NCAT, sclera clear pupils reactive Lungs: Accessory muscle use, diminished breath sounds, crackles in the bilateral bases and diminished in the bases Cardiovascular: Tachycardic, regular rate and rhythm S1-S2 Abdomen: Obese, soft mildly distended Extremities: No significant edema Neuro: Alert oriented following commands no focal deficit GU: Deferred  Resolved Hospital Problem list     Assessment & Plan:   Acute hypoxemic respiratory failure in the setting of acute on chronic systolic  and diastolic heart failure, acute renal failure, volume overload, bilateral small pleural effusion, patchy bilateral pulmonary infiltrates, several of the infiltrates are peribronchovascular and likely pulmonary edema, the infiltrates within the upper lobe could very well represent aspiration while being on BiPAP.  Acute NSTEMI unable to go for heart catheterization due to renal failure. Baseline functional status at chair bound. -I spent 32 minutes of advance care planning time discussing the patient's goals of care with him as well as his wife. -We discussed the risk benefits and alternatives of proceeding with transfer to the intensive care unit, what mechanical life support would look like, whether or not he would want dialysis if he was unable to make urine. -At this point the patient and patient's wife have made the decision that proceeding with a full DNR status as well as continued medical care to the point if he fails that transition to palliation would be appropriate. -I have consulted palliative care -I have placed orders for as needed morphine for  dyspnea -I agree with this plan as the patient's outcome on full mechanical support with dialysis support for volume removal and his current baseline at chair bound status would be poor. -Would leave patient on BiPAP until family is able to be at bedside.  Then would consider removal of BiPAP and placed back on oxygen for comfort.  Pulmonary appreciates the consultation.  Remainder per primary team.  Please call with any questions.   Labs   CBC: Recent Labs  Lab 02/28/19 0920 03/01/19 0334 03/02/19 0521 03/03/19 0513  WBC 13.4* 15.0* 14.2* 15.3*  NEUTROABS 11.5* 12.4* 11.8*  --   HGB 12.6* 12.1* 11.4* 10.8*  HCT 38.5* 36.3* 34.6* 33.3*  MCV 98.7 96.0 95.8 97.4  PLT 205 218 232 093    Basic Metabolic Panel: Recent Labs  Lab 02/28/19 0920 03/01/19 0334 03/02/19 0521 03/03/19 0513  NA 138 141 139 138  K 4.6 4.2 4.3 3.9  CL 111 112* 106 105  CO2 15* 16* 19* 15*  GLUCOSE 196* 114* 122* 94  BUN 37* 42* 66* 83*  CREATININE 2.10* 2.13* 2.84* 2.92*  CALCIUM 8.9 8.8* 9.1 8.6*   GFR: Estimated Creatinine Clearance: 18.3 mL/min (A) (by C-G formula based on SCr of 2.92 mg/dL (H)). Recent Labs  Lab 02/28/19 0920 02/28/19 0947  03/01/19 0334 03/02/19 0521 03/02/19 1232 03/02/19 1541 03/02/19 1808 03/02/19 2138 03/03/19 0513  PROCALCITON  --  <0.10  --   --   --   --   --   --   --   --   WBC 13.4*  --   --  15.0* 14.2*  --   --   --   --  15.3*  LATICACIDVEN  --   --    < >  --   --  3.0* 3.5* 2.8* 2.4*  --    < > = values in this interval not displayed.    Liver Function Tests: Recent Labs  Lab 02/28/19 0920 03/01/19 0334 03/02/19 0521 03/02/19 1232 03/03/19 0513  AST 50* 43* 313* 297* 163*  ALT 20 20 242* 261* 251*  ALKPHOS 51 45 47 48 44  BILITOT 2.7* 2.4* 2.8* 2.8* 2.7*  PROT 6.8 6.5 6.5 6.7 6.3*  ALBUMIN 3.7 3.3* 3.1* 3.2* 3.1*   No results for input(s): LIPASE, AMYLASE in the last 168 hours. No results for input(s): AMMONIA in the last 168 hours.   ABG    Component Value Date/Time   PHART 7.470 (H)  03/02/2019 1125   PCO2ART 24.7 (L) 03/02/2019 1125   PO2ART 63.5 (L) 03/02/2019 1125   HCO3 17.8 (L) 03/02/2019 1125   TCO2 23 02/01/2010 0949   ACIDBASEDEF 5.3 (H) 03/02/2019 1125   O2SAT 91.9 03/02/2019 1125     Coagulation Profile: Recent Labs  Lab 02/28/19 0920 03/01/19 0334 03/02/19 0521 03/03/19 0513  INR 4.3* 5.5* 5.5* 5.5*    Cardiac Enzymes: No results for input(s): CKTOTAL, CKMB, CKMBINDEX, TROPONINI in the last 168 hours.  HbA1C: Hgb A1c MFr Bld  Date/Time Value Ref Range Status  03/01/2019 03:34 AM 5.6 4.8 - 5.6 % Final    Comment:    (NOTE) Pre diabetes:          5.7%-6.4% Diabetes:              >6.4% Glycemic control for   <7.0% adults with diabetes   07/27/2017 12:39 PM 5.6 4.8 - 5.6 % Final    Comment:             Prediabetes: 5.7 - 6.4          Diabetes: >6.4          Glycemic control for adults with diabetes: <7.0     CBG: No results for input(s): GLUCAP in the last 168 hours.  Review of Systems:   Unable to be obtained secondary to patient's respiratory failure.  Past Medical History  He,  has a past medical history of (HFpEF) heart failure with preserved ejection fraction ([place]) (06/24/2017), Acute gout involving toe of left foot (12/16/2016), AKI (acute kidney injury) ([place]) (07/29/2017), Arthritis (02-15-12), Arthritis, BACK PAIN, LOW (06/29/2006), Bradycardia (10/06/2013), Carotid artery disease ([place]), Carpal tunnel syndrome (06/29/2006), Chronic diastolic CHF (congestive heart failure) ([place]) (08/21/2012), Chronic lower back pain, Complication of anesthesia (02-15-12), Coronary artery disease (02-15-12), Dementia ([place]), Depression, Emphysema of lung ([place]) (03/03/2019), Gait abnormality (09/02/2011), GASTROESOPHAGEAL REFLUX, NO ESOPHAGITIS (06/29/2006), Gouty arthropathy (06/29/2006), HEART VALVE REPLACEMENT, HX OF (12/31/2008), History of lacunar cerebrovascular accident (CVA) (03/02/2019), excision of  lamina of cervical vertebra for decompression of spinal cord (08/30/2017), Hypercholesterolemia (02-15-12), Hypertension, LBBB (left bundle branch block) (02/28/2019), Memory loss, PAD (peripheral artery disease) ([place]), ROTATOR CUFF TENDONITIS (06/29/2006), S/P AVR (aortic valve replacement), Skin cancer (06/29/2006), TOBACCO USE, QUIT (06/29/2006), Unspecified vitamin D deficiency (11/30/2012), and Urinary frequency.   Surgical History    Past Surgical History:  Procedure Laterality Date  . ABDOMINAL AORTAGRAM  12/01/2016  . ABDOMINAL AORTOGRAM N/A 12/01/2016   Procedure: ABDOMINAL AORTOGRAM;  Surgeon: Nelva Bush, MD;  Location: [facility];  Service: Cardiovascular;  Laterality: N/A;  . ANTERIOR CERVICAL DECOMP/DISCECTOMY FUSION       Decompression at the level of C3-C4 and C4-C5, allograft and plate from C3 to C%, microscope/notes 09/14/2010  . ANTERIOR CERVICAL DECOMP/DISCECTOMY FUSION N/A 07/11/2017   Procedure: CERVICAL FIVE-SIX ANTERIOR CERVICAL DECOMPRESSION/DISCECTOMY FUSION AND PLATE FIXATION;  Surgeon: Consuella Lose, MD;  Location: [facility];  Service: Neurosurgery;  Laterality: N/A;  CERVICAL FIVE-SIX ANTERIOR CERVICAL DECOMPRESSION/DISCECTOMY FUSION AND PLATE FIXATION  . BACK SURGERY    . CARDIAC VALVE REPLACEMENT    . CARPAL TUNNEL RELEASE Left 01/2003   Archie Endo 09/14/2010  . JOINT REPLACEMENT    . LOWER EXTREMITY ANGIOGRAPHY  12/01/2016  . LOWER EXTREMITY ANGIOGRAPHY Bilateral 12/01/2016   Procedure: Lower Extremity Angiography;  Surgeon: Nelva Bush, MD;  Location: [facility];  Service: Cardiovascular;  Laterality: Bilateral;  . MECHANICAL AORTIC VALVE REPLACEMENT  1993   Archie Endo 11/18/2016  . SHOULDER  OPEN ROTATOR CUFF REPAIR Left 02/15/2012  . TOTAL KNEE ARTHROPLASTY  02/20/2012   Procedure: TOTAL KNEE ARTHROPLASTY;  Surgeon: Gearlean Alf, MD;  Location: [facility];  Service: Orthopedics;  Laterality: Right;  . TRANSURETHRAL RESECTION OF PROSTATE  02/01/2010    Archie Endo 02/06/2010     Social History   reports that he quit smoking about 23 years ago. His smoking use included cigarettes. He has a 50.00 pack-year smoking history. He has never used smokeless tobacco. He reports that he does not drink alcohol or use drugs.   Family History   His family history includes Heart attack in his father; Heart disease in his mother; Hyperlipidemia in his father and mother; Hypertension in his father and mother; Varicose Veins in his mother.   Allergies No Known Allergies   Home Medications  Prior to Admission medications   Medication Sig Start Date End Date Taking? Authorizing Provider  acetaminophen (TYLENOL) 500 MG tablet Take 1,000 mg by mouth every 6 (six) hours as needed for mild pain.   Yes [provider]  amLODipine (NORVASC) 5 MG tablet Take 1 tablet (5 mg total) by mouth daily. 12/11/18  Yes [name], Jamal Collin, MD  buPROPion (WELLBUTRIN) 75 MG tablet TAKE 1 TABLET BY MOUTH EVERY DAY Patient taking differently: Take 75 mg by mouth daily.  08/13/18  Yes [name], Jamal Collin, MD  finasteride (PROSCAR) 5 MG tablet TAKE 1 TABLET (5 MG TOTAL) BY MOUTH DAILY. Patient taking differently: Take 5 mg by mouth daily.  08/20/18  Yes [name], Jamal Collin, MD  memantine (NAMENDA) 5 MG tablet TAKE 1 TABLET BY MOUTH TWICE A DAY Patient taking differently: Take 5 mg by mouth 2 (two) times daily.  10/26/18  Yes [name], Jamal Collin, MD  Menthol-Methyl Salicylate (MUSCLE RUB) 10-15 % CREA Apply 1 application topically as needed for muscle pain.   Yes [provider]  mirabegron ER (MYRBETRIQ) 25 MG TB24 tablet Take 1 tablet (25 mg total) by mouth daily. 01/18/19  Yes [name], Jamal Collin, MD  pravastatin (PRAVACHOL) 40 MG tablet TAKE 1 TABLET BY MOUTH EVERY DAY IN THE EVENING Patient taking differently: Take 40 mg by mouth daily.  10/26/18  Yes [name], Jamal Collin, MD  tamsulosin (FLOMAX) 0.4 MG CAPS capsule TAKE ONE CAPSULE BY MOUTH EVERY DAY Patient taking differently:  Take 0.4 mg by mouth daily.  07/21/18  Yes [name], Jamal Collin, MD  warfarin (COUMADIN) 5 MG tablet TAKE 1 TABLET AS DIRECTED Patient taking differently: Take 5 mg by mouth daily at 6 PM.  07/21/18  Yes [name], Jamal Collin, MD     Garner Nash, DO [facility] 03/03/2019 2:57 PM

## 2019-03-03 NOTE — Progress Notes (Signed)
ANTICOAGULATION CONSULT NOTE - Initial Consult  Pharmacy Consult for Warfarin and Heparin Indication: Mechanical Valve & ACS/Chest Pain  No Known Allergies  Patient Measurements: Height: _0  (167.6 cm) Weight: 175 lb 7.8 oz (79.6 kg) IBW/kg (Calculated) : 63.8  Vital Signs: Temp: 97.4 F (36.3 C) (10/31 2336) Temp Source: Oral (10/31 2336) BP: 109/62 (11/01 0457) Pulse Rate: 71 (11/01 0457)  Labs: Recent Labs    02/28/19 1840  03/01/19 0334 03/01/19 0453 03/01/19 0658 03/01/19 1554 03/02/19 0521 03/03/19 0513  HGB  --   --  12.1*  --   --   --  11.4* 10.8*  HCT  --   --  36.3*  --   --   --  34.6* 33.3*  PLT  --   --  218  --   --   --  232 260  APTT 51*  --   --   --   --   --   --   --   LABPROT  --   --  48.9*  --   --   --  48.9* 48.9*  INR  --   --  5.5*  --   --   --  5.5* 5.5*  CREATININE  --   --  2.13*  --   --   --  2.84* 2.92*  TROPONINIHS 11,342*   < > 12,000* 11,676* 12,126* 9,409*  --   --    < > = values in this interval not displayed.    Estimated Creatinine Clearance: 18.3 mL/min (A) (by C-G formula based on SCr of 2.92 mg/dL (H)).   Medical History: Past Medical History:  Diagnosis Date  . (HFpEF) heart failure with preserved ejection fraction ([place]) 06/24/2017  . Acute gout involving toe of left foot 12/16/2016  . AKI (acute kidney injury) ([place]) 07/29/2017  . Arthritis 02-15-12   osteoarthritis,right knee, pain right wrist.,  . Arthritis    "hands, legs" (12/01/2016)  . BACK PAIN, LOW 06/29/2006   Qualifier: Diagnosis of  By: Benna Dunks    . Bradycardia 10/06/2013  . Carotid artery disease ([place])    Carotid US 1/18: R 40-59; L 80-99  . Carpal tunnel syndrome 06/29/2006   Qualifier: Diagnosis of  By: Benna Dunks    . Chronic diastolic CHF (congestive heart failure) ([place]) 08/21/2012  . Chronic lower back pain   . Complication of anesthesia 02-15-12   very confused, and very slow to awaken after anesthesia  . Coronary artery disease 02-15-12    Heart valve replaced   . Dementia ([place])   . Depression   . Gait abnormality 09/02/2011  . GASTROESOPHAGEAL REFLUX, NO ESOPHAGITIS 06/29/2006   Qualifier: Diagnosis of  By: Benna Dunks    . Gouty arthropathy 06/29/2006   Mild - only one or two episodes.      Marland Kitchen HEART VALVE REPLACEMENT, HX OF 12/31/2008   Qualifier: Diagnosis of  By: Olevia Perches, MD, Glenetta Hew   . History of lacunar cerebrovascular accident (CVA) 03/02/2019   Brain MRI 09/10/17: Chronic ischemic foci in the right cerebellar hemisphere, bilateral basal ganglia consistent with small lacunar infarctions   . Hx of excision of lamina of cervical vertebra for decompression of spinal cord 08/30/2017  . Hypercholesterolemia 02-15-12  . Hypertension   . LBBB (left bundle branch block) 02/28/2019  . Memory loss   . PAD (peripheral artery disease) ([place])   . ROTATOR CUFF TENDONITIS 06/29/2006   Qualifier: Diagnosis of  By: Francena Hanly,  JOANNE    . S/P AVR (aortic valve replacement)    Mechanical St. Jude AVR // Echo 12/17: Moderate LVH, EF 60-65, normal wall motion, grade 1 diastolic dysfunction, mechanical AVR okay with mean gradient 8 mmHg, MAC, mild mitral stenosis (mean gradient 2 mmHg), mild MR, mild RAE, PASP 25  . Skin cancer 06/29/2006   Multiple areas of skin cancer change.  Sees Dermatologist at the skin surgery center.  See scanned note of 12/12/16.  Four biopsies. 1. AK 2. Squamus cell 3. Squamus cell 4. Basal cell infiltrating.  Given frequency and complexity, needs to stay with derm.     . TOBACCO USE, QUIT 06/29/2006   Qualifier: Diagnosis of  By: Benna Dunks    . Unspecified vitamin D deficiency 11/30/2012   Blood work at [facility] showed level=18   . Urinary frequency     Medications:  Scheduled:  . aspirin EC  81 mg Oral Daily  . atorvastatin  40 mg Oral q1800  . buPROPion  75 mg Oral Daily  . levalbuterol  0.63 mg Nebulization TID  . metoprolol tartrate  25 mg Oral BID  . predniSONE  40 mg Oral Q breakfast  .  vancomycin variable dose per unstable renal function (pharmacist dosing)   Does not apply See admin instructions  . Warfarin - Pharmacist Dosing Inpatient   Does not apply q1800   Assessment: Pt is a 85 y/o male who presents with progressive generalized fatigue and shortness of breath with infiltrates on x-ray. Furthermore, patient also has elevated troponin. PMH significant for mechanical aortic valve on warfarin. Last dose taken 10/28 _0 . Pharmacy has been consulted to dose warfarin.  Pt's INR today is supratherapeutic at 5.5, unchanged from yesterday and day before despite holding warfarin. Pt received 1 dose of Levaquin, which could have contributed to rise in INR. Hgb/Hct is stable at 10.8/33.3. Plts are wnls. No signs/symptoms of bleeding. Troponins are elevated but trending down 12,126>>9,409.  Pt's home warfarin dose per outpatient anticoag clinic note on 10/8 is 5 mg daily, except 7.5 mg on MWF. However, wife reports that pt actually has been taking 5 mg daily since the last clinic visit.   Goal of Therapy:  INR 2.5-3.5 Monitor platelets by anticoagulation protocol: Yes   Plan:  Hold warfarin today, no reversal necessary since pt is not actively bleeding Monitor daily Pt-INR and watch closely for signs/symptoms of bleeding Monitor CBC periodically Per Cardiology, start heparin infusion when INR < 2.5 F/u cath plan with cardiology  Sherren Kerns, PharmD PGY1 Acute Care Pharmacy Resident 03/03/2019,7:40 AM

## 2019-03-03 NOTE — Progress Notes (Signed)
Patient started c/o that he is nauseous and he is hurting all over, suddenly had quick change in respiratory states requiring BiPap,  Pulmology at beside. Confirmed with MD that wife wants patient to be DNR. DNR armband placed. Will continue to monitor.  Samiyyah Moffa, Therapist, sports .

## 2019-03-03 NOTE — Progress Notes (Signed)
Progress Note  Patient Name: Juan Li Date of Encounter: 03/03/2019  Primary Cardiologist: Nelva Bush, MD   Subjective   States that breathing is about the same as yesterday.  Still working.  No chest pain.  Inpatient Medications    Scheduled Meds: . aspirin EC  81 mg Oral Daily  . atorvastatin  40 mg Oral q1800  . buPROPion  75 mg Oral Daily  . levalbuterol  0.63 mg Nebulization TID  . metoprolol tartrate  25 mg Oral BID  . predniSONE  40 mg Oral Q breakfast  . vancomycin variable dose per unstable renal function (pharmacist dosing)   Does not apply See admin instructions  . Warfarin - Pharmacist Dosing Inpatient   Does not apply q1800   Continuous Infusions: . sodium chloride Stopped (03/02/19 1714)  . sodium chloride 120 mL/hr at 03/02/19 1931  . ceFEPime (MAXIPIME) IV Stopped (03/02/19 1423)   PRN Meds: sodium chloride, acetaminophen **OR** acetaminophen, levalbuterol, nitroGLYCERIN   Vital Signs    Vitals:   03/03/19 0457 03/03/19 0742 03/03/19 0834 03/03/19 0914  BP: 109/62   (!) 116/52  Pulse: 71 66 77 86  Resp:  (!) 23 (!) 22 (!) 23  Temp:      TempSrc:      SpO2:   92% 94%  Weight:      Height:        Intake/Output Summary (Last 24 hours) at 03/03/2019 0915 Last data filed at 03/03/2019 0303 Gross per 24 hour  Intake 607.36 ml  Output 250 ml  Net 357.36 ml   Last 3 Weights 03/03/2019 03/02/2019 03/01/2019  Weight (lbs) 175 lb 7.8 oz 171 lb 11.8 oz 178 lb 2.1 oz  Weight (kg) 79.6 kg 77.9 kg 80.8 kg      Telemetry  Continued sinus rhythm with occasional PACs personally Reviewed  ECG    Sinus rhythm ST depression ischemia- Personally Reviewed  Physical Exam   GEN: Well nourished, well developed, mild distress, elderly HEENT: normal  Neck: no JVD, carotid bruits, or masses Cardiac: S2 click RRR; no murmurs, rubs, or gallops,no edema  Respiratory: Mild wheeze bilaterally, mildly increased work of breathing GI: soft, nontender,  nondistended, + BS MS: no deformity or atrophy  Skin: warm and dry, no rash Neuro:  Alert and Oriented x 3, Strength and sensation are intact Psych: euthymic mood, full affect    Labs    High Sensitivity Troponin:   Recent Labs  Lab 02/28/19 2237 03/01/19 0334 03/01/19 0453 03/01/19 0658 03/01/19 1554  TROPONINIHS 12,409* 12,000* 11,676* 12,126* 9,409*      Chemistry Recent Labs  Lab 03/01/19 0334 03/02/19 0521 03/02/19 1232 03/03/19 0513  NA 141 139  --  138  K 4.2 4.3  --  3.9  CL 112* 106  --  105  CO2 16* 19*  --  15*  GLUCOSE 114* 122*  --  94  BUN 42* 66*  --  83*  CREATININE 2.13* 2.84*  --  2.92*  CALCIUM 8.8* 9.1  --  8.6*  PROT 6.5 6.5 6.7 6.3*  ALBUMIN 3.3* 3.1* 3.2* 3.1*  AST 43* 313* 297* 163*  ALT 20 242* 261* 251*  ALKPHOS 45 47 48 44  BILITOT 2.4* 2.8* 2.8* 2.7*  GFRNONAA 27* 19*  --  19*  GFRAA 32* 22*  --  22*  ANIONGAP 13 14  --  18*     Hematology Recent Labs  Lab 03/01/19 0334 03/02/19 0521 03/03/19 [id number]  WBC 15.0* 14.2* 15.3*  RBC 3.78* 3.61* 3.42*  HGB 12.1* 11.4* 10.8*  HCT 36.3* 34.6* 33.3*  MCV 96.0 95.8 97.4  MCH 32.0 31.6 31.6  MCHC 33.3 32.9 32.4  RDW 14.6 14.6 14.7  PLT 218 232 260    BNP Recent Labs  Lab 02/28/19 1840  BNP 1,533.4*     DDimer  Recent Labs  Lab 02/28/19 0947  DDIMER <0.27     Radiology    Dg Chest 1 View  Result Date: 03/02/2019 CLINICAL DATA:  Shortness of breath. EXAM: CHEST  1 VIEW COMPARISON:  Chest x-ray March 02, 2019 FINDINGS: There is infiltrate within the right perihilar region again identified. There may be mild infiltrate in the left retrocardiac region as well. No pneumothorax. No other changes. IMPRESSION: Right-sided infiltrate worrisome for pneumonia. Possible left retrocardiac infiltrate as well. Recommend follow-up imaging to ensure resolution of findings. Electronically Signed   By: Dorise Bullion III M.D   On: 03/02/2019 13:13   Ct Chest Wo Contrast  Result Date:  03/02/2019 CLINICAL DATA:  Shortness of breath. EXAM: CT CHEST WITHOUT CONTRAST TECHNIQUE: Multidetector CT imaging of the chest was performed following the standard protocol without IV contrast. COMPARISON:  Chest x-ray March 02, 2019 FINDINGS: Cardiovascular: The heart size is borderline. Coronary artery calcifications involve the right and left coronary arteries. Atherosclerotic changes are seen in the nonaneurysmal aorta. Central pulmonary arteries are normal in caliber. Mediastinum/Nodes: Thyroid and esophagus are unremarkable. There is a prominent subcarinal node measuring 12 mm in short axis. There is an 11 mm right pretracheal node and an 11 mm right paratracheal node on image 33. There is also a prominent node to the right of the esophagus at the thoracic inlet measuring 9 mm on series 3, image 18. Small bilateral pleural effusions are identified. No pericardial effusion. Lungs/Pleura: Multi lobar infiltrate is identified, right greater than left, particularly in the right upper lobe in the bilateral lower lobes. Emphysematous changes are identified in the lungs. No nodules or masses. Upper Abdomen: No acute abnormality. Musculoskeletal: No chest wall mass or suspicious bone lesions identified. IMPRESSION: 1. Bilateral pulmonary infiltrates are most consistent with multifocal pneumonia. Recommend short-term follow-up imaging to ensure resolution. 2. Mild adenopathy in the chest as above. These nodes may be reactive given the findings in the lungs. Recommend short-term follow-up imaging to ensure resolution. 3. Coronary artery calcifications. 4. Atherosclerotic changes in the nonaneurysmal aorta. 5. Emphysematous changes in the lungs. Aortic Atherosclerosis (ICD10-I70.0) and Emphysema (ICD10-J43.9). Electronically Signed   By: Dorise Bullion III M.D   On: 03/02/2019 13:21   US Abdomen Limited Ruq  Result Date: 03/02/2019 CLINICAL DATA:  Trans am in itis. EXAM: ULTRASOUND ABDOMEN LIMITED RIGHT UPPER  QUADRANT COMPARISON:  None. FINDINGS: Gallbladder: No gallstones or wall thickening visualized. No sonographic Murphy sign noted by sonographer. Common bile duct: Diameter: 4 mm Liver: No focal lesion identified. Within normal limits in parenchymal echogenicity. Portal vein is patent on color Doppler imaging with normal direction of blood flow towards the liver. Other: None. IMPRESSION: Normal right upper quadrant ultrasound. Electronically Signed   By: Lajean Manes M.D.   On: 03/02/2019 17:40    Cardiac Studies   EF 40 to 45%  Patient Profile     85 y.o. male with the following medical issues see below:  Assessment & Plan    Acute respiratory failure with acute on chronic systolic heart failure and diastolic heart failure -EF 40 to 45% with grade 3 restrictive  diastolic dysfunction high LV filling pressures. -On 10/30, on Lasix 80 mg IV twice daily. -His creatinine continues to rise despite holding IV Lasix starting on 03/02/2019.  BUN 83 creatinine 2.92 -BiPAP as needed for improved ventilation. -Plan on cardiac catheterization next week pending renal recovery, we are currently going in the wrong direction.  Not able to perform catheterization as of now. -BNP 8000  Community-acquired pneumonia -Right-sided infiltrate more worrisome for pneumonia on 03/02/2019 chest x-ray, left retrocardiac infiltrate as well.  On antibiotics.  Non-ST elevation myocardial infarction -Associated cardiomyopathy, heart failure, elevated high-sensitivity troponin 12,000, starting to trend downward -Interestingly, denied any chest discomfort. -Ill with multisystem organ failure.  Mechanical aortic valve -Chronic Coumadin. -Holding Coumadin. -Transition to heparin once INR less than 2.5. -Current INR 5.5.  Acute kidney injury on chronic kidney disease stage III -Baseline creatinine around 1.6.  Currently increasing to 2.9 with BUN of 83 following aggressive diuresis.  Avoiding nephrotoxic agents.  We  will go ahead and hold his Lasix again today started holding on 03/02/2019).  Dementia -Stable.  Hyperlipidemia -On atorvastatin 40 mg-LDL 99 -Elevated liver enzymes likely in relation to current myocardial infarction.  Abdominal ultrasound unremarkable.  High risk individual with multisystem organ failure.     For questions or updates, please contact [place] Please consult [email or web address] for contact info under        Signed, Candee Furbish, MD  03/03/2019, 9:15 AM  '

## 2019-03-03 NOTE — Psychosocial Assessment (Signed)
Patient still has a diet order, paged MD Volanda Napoleon , per MD they cannot d/c and put patient NPO. Removed BiPap for few seconds in order to attempt and put him back on HFNC however patient immediatly desaturated down to 80's. Do not feel comfortable giving medication. Granddaughter and granson at beside.

## 2019-03-03 NOTE — Progress Notes (Signed)
Patient c/o that he is hurting all over the body. Paged MD , will come to see him.

## 2019-03-03 NOTE — Progress Notes (Signed)
No acute change will continue to monitor patient

## 2019-03-03 NOTE — Progress Notes (Signed)
D/c teli per MD order.

## 2019-03-03 NOTE — Progress Notes (Signed)
Patient c/o of nausea. No pain,paged MD for addition medication.

## 2019-03-03 NOTE — Progress Notes (Signed)
PCCM:  I spoke with patient and wife. Full consult note to follow.  Patient now DNR. Worsening respiratory failure. Consulted palliative.  I have updated primary team.   Garner Nash, DO [facility] 03/03/2019 1:43 PM

## 2019-03-03 NOTE — Consult Note (Signed)
Palliative Medicine   Name: Juan Li Date: 03/03/2019 MRN: [id number]  DOB: Mar 29, 1932  Patient Care Team: Zenia Resides, MD as PCP - General End, Harrell Gave, MD as PCP - Cardiology (Cardiology) Rana Snare, MD (Urology) Clent Jacks, MD (Ophthalmology) Latanya Maudlin, MD (Orthopedic Surgery) Dr. Mariea Clonts (Dentistry) Leanna Battles, MD as Consulting Physician (Internal Medicine)    REASON FOR CONSULTATION: Palliative Care consult requested for this 85 y.o. male with multiple medical problems including combined systolic and diastolic failure, CAD, AVR on Coumadin, who was admitted to the hospital on 02/28/2019 with respiratory failure and was found to have pneumonia.  Patient's hospitalization complicated by worsening multiorgan dysfunction.  He acutely declined on 11/1 with worsening respiratory failure requiring BiPAP.  Pulmonary met with family for consideration intubation but wife opted to change CODE STATUS to DNR.  Healthcare was consulted to help address goals.   SOCIAL HISTORY:     reports that he quit smoking about 23 years ago. His smoking use included cigarettes. He has a 50.00 pack-year smoking history. He has never used smokeless tobacco. He reports that he does not drink alcohol or use drugs.   Patient is married and lives at home with his wife.  He has a son who lives locally and another son in [place].  ADVANCE DIRECTIVES:  Not on file  CODE STATUS: DNR  PAST MEDICAL HISTORY: Past Medical History:  Diagnosis Date   (HFpEF) heart failure with preserved ejection fraction ([place]) 06/24/2017   Acute gout involving toe of left foot 12/16/2016   AKI (acute kidney injury) ([place]) 07/29/2017   Arthritis 02-15-12   osteoarthritis,right knee, pain right wrist.,   Arthritis    "hands, legs" (12/01/2016)   BACK PAIN, LOW 06/29/2006   Qualifier: Diagnosis of  By: Benna Dunks     Bradycardia 10/06/2013   Carotid artery disease ([place])    Carotid US 1/18:  R 40-59; L 80-99   Carpal tunnel syndrome 06/29/2006   Qualifier: Diagnosis of  By: Benna Dunks     Chronic diastolic CHF (congestive heart failure) ([place]) 08/21/2012   Chronic lower back pain    Complication of anesthesia 02-15-12   very confused, and very slow to awaken after anesthesia   Coronary artery disease 02-15-12   Heart valve replaced    Dementia ([place])    Depression    Emphysema of lung ([place]) 03/03/2019   Gait abnormality 09/02/2011   GASTROESOPHAGEAL REFLUX, NO ESOPHAGITIS 06/29/2006   Qualifier: Diagnosis of  By: Benna Dunks     Gouty arthropathy 06/29/2006   Mild - only one or two episodes.       HEART VALVE REPLACEMENT, HX OF 12/31/2008   Qualifier: Diagnosis of  By: Olevia Perches, MD, Glenetta Hew    History of lacunar cerebrovascular accident (CVA) 03/02/2019   Brain MRI 09/10/17: Chronic ischemic foci in the right cerebellar hemisphere, bilateral basal ganglia consistent with small lacunar infarctions    Hx of excision of lamina of cervical vertebra for decompression of spinal cord 08/30/2017   Hypercholesterolemia 02-15-12   Hypertension    LBBB (left bundle branch block) 02/28/2019   Memory loss    PAD (peripheral artery disease) ([place])    ROTATOR CUFF TENDONITIS 06/29/2006   Qualifier: Diagnosis of  By: Benna Dunks     S/P AVR (aortic valve replacement)    Mechanical [employer] AVR // Echo 12/17: Moderate LVH, EF 60-65, normal wall motion, grade 1 diastolic dysfunction, mechanical AVR okay with mean gradient  8 mmHg, MAC, mild mitral stenosis (mean gradient 2 mmHg), mild MR, mild RAE, PASP 25   Skin cancer 06/29/2006   Multiple areas of skin cancer change.  Sees Dermatologist at the skin surgery center.  See scanned note of 12/12/16.  Four biopsies. 1. AK 2. Squamus cell 3. Squamus cell 4. Basal cell infiltrating.  Given frequency and complexity, needs to stay with derm.      TOBACCO USE, QUIT 06/29/2006   Qualifier: Diagnosis of  By: Benna Dunks      Unspecified vitamin D deficiency 11/30/2012   Blood work at [facility] showed level=18    Urinary frequency     PAST SURGICAL HISTORY:  Past Surgical History:  Procedure Laterality Date   ABDOMINAL AORTAGRAM  12/01/2016   ABDOMINAL AORTOGRAM N/A 12/01/2016   Procedure: ABDOMINAL AORTOGRAM;  Surgeon: Nelva Bush, MD;  Location: [facility];  Service: Cardiovascular;  Laterality: N/A;   ANTERIOR CERVICAL DECOMP/DISCECTOMY FUSION       Decompression at the level of C3-C4 and C4-C5, allograft and plate from C3 to C%, microscope/notes 09/14/2010   ANTERIOR CERVICAL DECOMP/DISCECTOMY FUSION N/A 07/11/2017   Procedure: CERVICAL FIVE-SIX ANTERIOR CERVICAL DECOMPRESSION/DISCECTOMY FUSION AND PLATE FIXATION;  Surgeon: Consuella Lose, MD;  Location: [facility];  Service: Neurosurgery;  Laterality: N/A;  CERVICAL FIVE-SIX ANTERIOR CERVICAL DECOMPRESSION/DISCECTOMY FUSION AND PLATE FIXATION   BACK SURGERY     CARDIAC VALVE REPLACEMENT     CARPAL TUNNEL RELEASE Left 01/2003   Archie Endo 09/14/2010   JOINT REPLACEMENT     LOWER EXTREMITY ANGIOGRAPHY  12/01/2016   LOWER EXTREMITY ANGIOGRAPHY Bilateral 12/01/2016   Procedure: Lower Extremity Angiography;  Surgeon: Nelva Bush, MD;  Location: [facility];  Service: Cardiovascular;  Laterality: Bilateral;   MECHANICAL AORTIC VALVE REPLACEMENT  1993   /notes 11/18/2016   SHOULDER OPEN ROTATOR CUFF REPAIR Left 02/15/2012   TOTAL KNEE ARTHROPLASTY  02/20/2012   Procedure: TOTAL KNEE ARTHROPLASTY;  Surgeon: Gearlean Alf, MD;  Location: [facility];  Service: Orthopedics;  Laterality: Right;   TRANSURETHRAL RESECTION OF PROSTATE  02/01/2010   Archie Endo 02/06/2010    HEMATOLOGY/ONCOLOGY HISTORY:  Oncology History   No history exists.    ALLERGIES:  has No Known Allergies.  MEDICATIONS:  Current Facility-Administered Medications  Medication Dose Route Frequency Provider Last Rate Last Dose   acetaminophen (TYLENOL) tablet 650 mg   650 mg Oral Q6H PRN Darrelyn Hillock N, DO       Or   acetaminophen (TYLENOL) suppository 650 mg  650 mg Rectal Q6H PRN Patriciaann Clan, DO       aspirin EC tablet 81 mg  81 mg Oral Daily Matilde Haymaker, MD   81 mg at 03/03/19 0915   atorvastatin (LIPITOR) tablet 40 mg  40 mg Oral q1800 Matilde Haymaker, MD   40 mg at 03/02/19 1757   buPROPion [facility]) tablet 75 mg  75 mg Oral Daily Darrelyn Hillock N, DO   75 mg at 03/03/19 0916   ceFEPIme (MAXIPIME) 2 g in sodium chloride 0.9 % 100 mL IVPB  2 g Intravenous Q24H Sherren Kerns D, RPH 200 mL/hr at 03/03/19 1341 2 g at 03/03/19 1341   levalbuterol (XOPENEX) nebulizer solution 0.63 mg  0.63 mg Nebulization TID McDiarmid, Blane Ohara, MD   0.63 mg at 03/03/19 1411   levalbuterol (XOPENEX) nebulizer solution 0.63 mg  0.63 mg Nebulization Q6H PRN McDiarmid, Blane Ohara, MD       metoprolol tartrate (LOPRESSOR) tablet 25 mg  25 mg Oral BID Matilde Haymaker, MD   25 mg at 03/03/19 0916   morphine 2 MG/ML injection 2 mg  2 mg Intravenous Q1H PRN Icard, Bradley L, DO       nitroGLYCERIN (NITROSTAT) SL tablet 0.4 mg  0.4 mg Sublingual Q5 min PRN Shirley, [place], DO       ondansetron [facility]) injection 4 mg  4 mg Intravenous Q6H PRN Candee Furbish, MD   4 mg at 03/03/19 1232   oxyCODONE (Oxy IR/ROXICODONE) immediate release tablet 5 mg  5 mg Oral Q4H PRN Icard, Bradley L, DO       predniSONE (DELTASONE) tablet 40 mg  40 mg Oral Q breakfast McDiarmid, Blane Ohara, MD   40 mg at 03/03/19 0810   vancomycin variable dose per unstable renal function (pharmacist dosing)   Does not apply See admin instructions Werner Lean, [facility]       Warfarin - Pharmacist Dosing Inpatient   Does not apply q1800 Werner Lean, [facility]        VITAL SIGNS: BP 112/65    Pulse 88    Temp (!) 97.4 F (36.3 C) (Oral)    Resp 20    Ht _0  (1.676 m)    Wt 175 lb 7.8 oz (79.6 kg)    SpO2 95%    BMI 28.32 kg/m  Filed Weights   03/01/19 0500 03/02/19 0424 03/03/19 0314  Weight: 178 lb 2.1 oz  (80.8 kg) 171 lb 11.8 oz (77.9 kg) 175 lb 7.8 oz (79.6 kg)    Estimated body mass index is 28.32 kg/m as calculated from the following:   Height as of this encounter: _1  (1.676 m).   Weight as of this encounter: 175 lb 7.8 oz (79.6 kg).  LABS: CBC:    Component Value Date/Time   WBC 15.3 (H) 03/03/2019 0513   HGB 10.8 (L) 03/03/2019 0513   HGB 14.1 01/16/2019 1657   HCT 33.3 (L) 03/03/2019 0513   HCT 42.3 01/16/2019 1657   PLT 260 03/03/2019 0513   PLT 241 01/16/2019 1657   MCV 97.4 03/03/2019 0513   MCV 94 01/16/2019 1657   NEUTROABS 11.8 (H) 03/02/2019 0521   NEUTROABS 16.2 (H) 07/27/2017 1239   LYMPHSABS 1.2 03/02/2019 0521   LYMPHSABS 1.4 07/27/2017 1239   MONOABS 1.0 03/02/2019 0521   EOSABS 0.0 03/02/2019 0521   EOSABS 0.1 07/27/2017 1239   BASOSABS 0.0 03/02/2019 0521   BASOSABS 0.0 07/27/2017 1239   Comprehensive Metabolic Panel:    Component Value Date/Time   NA 138 03/03/2019 0513   NA 138 01/16/2019 1657   K 3.9 03/03/2019 0513   CL 105 03/03/2019 0513   CO2 15 (L) 03/03/2019 0513   BUN 83 (H) 03/03/2019 0513   BUN 24 01/16/2019 1657   CREATININE 2.92 (H) 03/03/2019 0513   CREATININE 1.63 (H) 03/29/2016 1630   GLUCOSE 94 03/03/2019 0513   CALCIUM 8.6 (L) 03/03/2019 0513   AST 163 (H) 03/03/2019 0513   ALT 251 (H) 03/03/2019 0513   ALKPHOS 44 03/03/2019 0513   BILITOT 2.7 (H) 03/03/2019 0513   BILITOT 0.9 01/16/2019 1657   PROT 6.3 (L) 03/03/2019 0513   PROT 7.0 01/16/2019 1657   ALBUMIN 3.1 (L) 03/03/2019 0513   ALBUMIN 4.5 01/16/2019 1657    RADIOGRAPHIC STUDIES: Dg Chest 1 View  Result Date: 03/02/2019 CLINICAL DATA:  Shortness of breath. EXAM: CHEST  1 VIEW COMPARISON:  Chest x-ray March 02, 2019 FINDINGS:  There is infiltrate within the right perihilar region again identified. There may be mild infiltrate in the left retrocardiac region as well. No pneumothorax. No other changes. IMPRESSION: Right-sided infiltrate worrisome for pneumonia.  Possible left retrocardiac infiltrate as well. Recommend follow-up imaging to ensure resolution of findings. Electronically Signed   By: Dorise Bullion III M.D   On: 03/02/2019 13:13   Ct Chest Wo Contrast  Result Date: 03/02/2019 CLINICAL DATA:  Shortness of breath. EXAM: CT CHEST WITHOUT CONTRAST TECHNIQUE: Multidetector CT imaging of the chest was performed following the standard protocol without IV contrast. COMPARISON:  Chest x-ray March 02, 2019 FINDINGS: Cardiovascular: The heart size is borderline. Coronary artery calcifications involve the right and left coronary arteries. Atherosclerotic changes are seen in the nonaneurysmal aorta. Central pulmonary arteries are normal in caliber. Mediastinum/Nodes: Thyroid and esophagus are unremarkable. There is a prominent subcarinal node measuring 12 mm in short axis. There is an 11 mm right pretracheal node and an 11 mm right paratracheal node on image 33. There is also a prominent node to the right of the esophagus at the thoracic inlet measuring 9 mm on series 3, image 18. Small bilateral pleural effusions are identified. No pericardial effusion. Lungs/Pleura: Multi lobar infiltrate is identified, right greater than left, particularly in the right upper lobe in the bilateral lower lobes. Emphysematous changes are identified in the lungs. No nodules or masses. Upper Abdomen: No acute abnormality. Musculoskeletal: No chest wall mass or suspicious bone lesions identified. IMPRESSION: 1. Bilateral pulmonary infiltrates are most consistent with multifocal pneumonia. Recommend short-term follow-up imaging to ensure resolution. 2. Mild adenopathy in the chest as above. These nodes may be reactive given the findings in the lungs. Recommend short-term follow-up imaging to ensure resolution. 3. Coronary artery calcifications. 4. Atherosclerotic changes in the nonaneurysmal aorta. 5. Emphysematous changes in the lungs. Aortic Atherosclerosis (ICD10-I70.0) and Emphysema  (ICD10-J43.9). Electronically Signed   By: Dorise Bullion III M.D   On: 03/02/2019 13:21   Dg Chest Port 1 View  Result Date: 02/28/2019 CLINICAL DATA:  Shortness of breath EXAM: PORTABLE CHEST 1 VIEW COMPARISON:  07/28/2017 FINDINGS: Prior median sternotomy. Stable cardiomediastinal contours. Patchy right upper lobe airspace opacity. Mildly increased interstitial markings bilaterally. No pleural effusion or pneumothorax. IMPRESSION: Patchy right upper lobe airspace opacity suspicious for pneumonia. Electronically Signed   By: Davina Poke M.D.   On: 02/28/2019 09:35   US Abdomen Limited Ruq  Result Date: 03/02/2019 CLINICAL DATA:  Trans am in itis. EXAM: ULTRASOUND ABDOMEN LIMITED RIGHT UPPER QUADRANT COMPARISON:  None. FINDINGS: Gallbladder: No gallstones or wall thickening visualized. No sonographic Murphy sign noted by sonographer. Common bile duct: Diameter: 4 mm Liver: No focal lesion identified. Within normal limits in parenchymal echogenicity. Portal vein is patent on color Doppler imaging with normal direction of blood flow towards the liver. Other: None. IMPRESSION: Normal right upper quadrant ultrasound. Electronically Signed   By: Lajean Manes M.D.   On: 03/02/2019 17:40    PERFORMANCE STATUS (ECOG) : 4 - Bedbound  Review of Systems Unable to complete  Physical Exam General: Critically ill-appearing Pulmonary: On BiPAP Extremities: no edema, no joint deformities Skin: no rashes Neurological: Poorly responsive  IMPRESSION: Patient acutely declined earlier today with worsening respiratory failure requiring BiPAP.  He has also had worsening renal failure.  Pulmonary met with wife earlier today and made patient a DNR.  Patient currently remains on BiPAP but is poorly responsive and unable to engage in a conversation regarding goals.  Nursing  reports that patient has been pulling off BiPAP mask.  I met with patient's grandson and granddaughter, both of whom were at bedside.   I updated them on patient's current medical problems.  Patient's wife had to leave the hospital due to a power outage in their home.  Patient's grandchildren give me her phone number.  Called and spoke with patient's wife via phone.  She says that patient has been anticipating and talking about his death for years.  He has been recently planning his funeral.  She feels that patient would not want to escalate care and in fact would likely opt just to be kept comfortable.  However, patient's son is in route from [place] and wife would like to continue the current scope of treatment for now.  We did discuss the option of comfort care in detail and I suspect that she would be interested in transitioning to comfort care after her son's arrival if patient fails to improve.  PLAN: -Continue current scope of treatment -DNR/DNI -We will follow   Time Total: 60 minutes  Visit consisted of counseling and education dealing with the complex and emotionally intense issues of symptom management and palliative care in the setting of serious and potentially life-threatening illness.Greater than 50%  of this time was spent counseling and coordinating care related to the above assessment and plan.  Signed by: Altha Harm, PhD, NP-C

## 2019-03-03 NOTE — Progress Notes (Signed)
PRN medication working, no nausea or vomiting

## 2019-03-04 DIAGNOSIS — N1831 Chronic kidney disease, stage 3a: Secondary | ICD-10-CM

## 2019-03-04 DIAGNOSIS — R570 Cardiogenic shock: Secondary | ICD-10-CM

## 2019-03-04 DIAGNOSIS — K7200 Acute and subacute hepatic failure without coma: Secondary | ICD-10-CM

## 2019-03-04 DIAGNOSIS — R627 Adult failure to thrive: Secondary | ICD-10-CM

## 2019-03-04 LAB — PROTIME-INR
INR: >10 (ref 0.8–1.2)
INR: >10 (ref 0.8–1.2)
Prothrombin Time: 81.2 seconds — ABNORMAL HIGH (ref 11.4–15.2)
Prothrombin Time: 86.8 seconds — ABNORMAL HIGH (ref 11.4–15.2)

## 2019-03-04 LAB — COMPREHENSIVE METABOLIC PANEL
ALT: 549 U/L — ABNORMAL HIGH (ref 0–44)
AST: 442 U/L — ABNORMAL HIGH (ref 15–41)
Albumin: 2.8 g/dL — ABNORMAL LOW (ref 3.5–5.0)
Alkaline Phosphatase: 55 U/L (ref 38–126)
Anion gap: 14 (ref 5–15)
BUN: 107 mg/dL — ABNORMAL HIGH (ref 8–23)
CO2: 16 mmol/L — ABNORMAL LOW (ref 22–32)
Calcium: 8.5 mg/dL — ABNORMAL LOW (ref 8.9–10.3)
Chloride: 109 mmol/L (ref 98–111)
Creatinine, Ser: 3.23 mg/dL — ABNORMAL HIGH (ref 0.61–1.24)
GFR calc Af Amer: 19 mL/min — ABNORMAL LOW (ref >60)
GFR calc non Af Amer: 17 mL/min — ABNORMAL LOW (ref >60)
Glucose, Bld: 129 mg/dL — ABNORMAL HIGH (ref 70–99)
Potassium: 5.1 mmol/L (ref 3.5–5.1)
Sodium: 139 mmol/L (ref 135–145)
Total Bilirubin: 2.7 mg/dL — ABNORMAL HIGH (ref 0.3–1.2)
Total Protein: 6 g/dL — ABNORMAL LOW (ref 6.5–8.1)

## 2019-03-04 LAB — CBC
HCT: 33.7 % — ABNORMAL LOW (ref 39.0–52.0)
Hemoglobin: 10.9 g/dL — ABNORMAL LOW (ref 13.0–17.0)
MCH: 32.1 pg (ref 26.0–34.0)
MCHC: 32.3 g/dL (ref 30.0–36.0)
MCV: 99.1 fL (ref 80.0–100.0)
Platelets: 296 10*3/uL (ref 150–400)
RBC: 3.4 MIL/uL — ABNORMAL LOW (ref 4.22–5.81)
RDW: 14.7 % (ref 11.5–15.5)
WBC: 15.1 10*3/uL — ABNORMAL HIGH (ref 4.0–10.5)
nRBC: 0.1 % (ref 0.0–0.2)

## 2019-03-04 LAB — GLUCOSE, CAPILLARY: Glucose-Capillary: 106 mg/dL — ABNORMAL HIGH (ref 70–99)

## 2019-03-04 LAB — MRSA PCR SCREENING: MRSA by PCR: NEGATIVE

## 2019-03-04 LAB — VANCOMYCIN, RANDOM: Vancomycin Rm: 13

## 2019-03-04 MED ORDER — LORAZEPAM 2 MG/ML PO CONC: 1.0000 mg | ORAL | Status: DC | PRN

## 2019-03-04 MED ORDER — LORAZEPAM 1 MG PO TABS: 1.0000 mg | ORAL_TABLET | ORAL | Status: DC | PRN

## 2019-03-04 MED ORDER — LORAZEPAM 2 MG/ML IJ SOLN
1.0000 mg | INTRAMUSCULAR | Status: DC | PRN
  Administered 2019-03-05 – 2019-03-08 (×7): 1 mg via INTRAVENOUS
  Filled 2019-03-04 (×7): qty 1

## 2019-03-04 MED ORDER — ACETAMINOPHEN 650 MG RE SUPP: 650.0000 mg | Freq: Four times a day (QID) | RECTAL | Status: DC | PRN

## 2019-03-04 MED ORDER — VITAMIN K1 10 MG/ML IJ SOLN: 10.0000 mg | Freq: Once | INTRAVENOUS | Status: DC

## 2019-03-04 MED ORDER — ACETAMINOPHEN 325 MG PO TABS: 650.0000 mg | ORAL_TABLET | Freq: Four times a day (QID) | ORAL | Status: DC | PRN

## 2019-03-04 MED ORDER — OXYCODONE HCL 5 MG PO TABS: 5.0000 mg | ORAL_TABLET | ORAL | Status: DC | PRN

## 2019-03-04 MED ORDER — VITAMIN K1 10 MG/ML IJ SOLN
5.0000 mg | Freq: Once | INTRAVENOUS | Status: AC
  Administered 2019-03-04: 5 mg via INTRAVENOUS
  Filled 2019-03-04: qty 0.5

## 2019-03-04 MED ORDER — MORPHINE SULFATE (PF) 2 MG/ML IV SOLN
1.0000 mg | INTRAVENOUS | Status: DC | PRN
  Administered 2019-03-04 – 2019-03-08 (×15): 1 mg via INTRAVENOUS
  Filled 2019-03-04 (×16): qty 1

## 2019-03-04 NOTE — Progress Notes (Signed)
Visit by me, who is longstanding PCP for both patient and wife.  Mr. Juan Li) was able to participate in the discussion to a limited degree.  BOTTOM LINE: After his visit this evening from his son who is coming in from out of town ([place]), we have decided to switch to comfort care. This will include DC of all monitors, DC BIPAP, DC antibiotics and adding iv morphine to better control his considerable discomfort (back pain.)  Background.  Juan Li has had slowly progressive dementia for years.  He also has limited ambulation due to cervical myelopathy.  His quality of life prior to this hospitalization was OK but not stellar.  At 51 with a host of other medical problems, we have all known that the end would come soon. It seems to have come abruptly with pneumonia and multi-organ failure.  He has an extremely poor prognosis.  In the very unlikely event of a recovery, it would be prolonged and would include SNF placement for the chance of rehab.  One firm, longstanding wish that Juan Li reiterated, "Don't put me in a nursing home."  He and his wife want his pain treated even knowing that will decrease alertness and potentially compromise respirations. I fully support the switch to comfort care based on comorbid conditions, current extremely poor prognosis, and the patient's previously stated values.  Wife is being extremely loyal and supporting the wishes of her husband.  I have discussed this plan with the [facility] resident team.

## 2019-03-04 NOTE — Progress Notes (Signed)
Pharmacy Antibiotic Note  Juan Li is a 85 y.o. male with PMH of HLD, dementia, HTN, Heart failure with preserved ejection fraction, s/p aortic valve replacement on chronic anticoagulation who presents with progressive generalized fatigue and shortness of breath with infiltrates on x-ray. There is concern for sepsis. Pharmacy has been consulted to dose vancomycin and cefepime.  Patient is afebrile. WBC is elevated at 15.1 trending up. Blood pressure low at 91/59. Scr continued to trend up now from 2.92 to 3.23. Given patient's negative blood cultures and continuing multiorgan decline, will consider discontinuing vancomycin and cefepime if blood cultures remain negative and MRSA PCR screen is negative.   Vancomycin random level this morning therapeutic at 13. Last dose given 10/31.   Plan: Continue cefepime 2g IV q24 hours Will not redose vancomycin today Consider discontinuing antibiotics tomorrow if cultures negative and MRSA PCR negative--at least the vancomycin If continued, will pulse dose vancomycin due to AKI Monitor renal function, WBC, temp, and clinical status  Height: 5\' 6"  (167.6 cm) Weight: 176 lb 12.9 oz (80.2 kg) IBW/kg (Calculated) : 63.8  Temp (24hrs), Avg:97.7 F (36.5 C), Min:97.3 F (36.3 C), Max:98 F (36.7 C)  Recent Labs  Lab 02/28/19 0920  03/01/19 0334 03/02/19 0521  03/02/19 1541 03/02/19 1808 03/02/19 2138 03/03/19 0513 03/03/19 1347 03/03/19 1553 03/04/19 0622  WBC 13.4*  --  15.0* 14.2*  --   --   --   --  15.3*  --   --  15.1*  CREATININE 2.10*  --  2.13* 2.84*  --   --   --   --  2.92*  --   --  3.23*  LATICACIDVEN  --    < >  --   --    < > 3.5* 2.8* 2.4*  --  2.6* 2.0*  --   VANCORANDOM  --   --   --   --   --   --   --   --   --   --   --  13   < > = values in this interval not displayed.    Estimated Creatinine Clearance: 16.6 mL/min (A) (by C-G formula based on SCr of 3.23 mg/dL (H)).    No Known Allergies  Antimicrobials this  admission: LVQ 10/29 x 1 Azithromycin 10/31 x 1 Cefepime 10/31 >> Vancomycin 10/31 >>  10/29 UCx >> Multiple species - likely contaminated 10/29 BCx >> NG x 2 10/29 COVID >> negative x2  Thank you for allowing pharmacy to be a part of this patient's care.  Agnes Lawrence, PharmD PGY1 Pharmacy Resident 03/04/2019 11:16 AM

## 2019-03-04 NOTE — Progress Notes (Signed)
Family Medicine Teaching Service Daily Progress Note Intern Pager: [phone]  Patient name: Juan Li Medical record number: [id number] Date of birth: [birth year] Age: 85 y.o. Gender: male  Primary Care Provider: Zenia Resides, MD Consultants: Cards Code Status: DNR  Pt Overview and Major Events to Date:  10/29-admit to hospital for shortness of breath generalized fatigue  Assessment and Plan: Juan Li a 85 y.o.malepresenting with progressive generalized fatigue and shortness of breath,found to have infiltrate on x-ray concerning for pneumonia and elevated troponin. PMH is significant forhyperlipidemia, dementia, hypertension, heart failure with preserved ejection fraction, s/p aortic valve replacement on chronic anticoagulation with warfarin, chronic neck pain, CAD/PVD.  Multiorgan failure and poor prognosis: patient's health has unfortunately declined and is exhibiting signs and labs suggestive of multiple organ's failing. Given his current status would recommend the patient discuss his wishes with the family (further treatment vs comfort care, etc). The patient's family would benefit from discussing his current prognosis and decide as a group the level of medical intervention desired in the patient's care. -Plan to discuss with the family -Encourage the involvement of the patient's PCP Dr. Andria Frames    Respiratory failure with acute on chronic mixed heart failure -40 to 45% Vital signs stable overnight.  Urine output 457mL over last 24 hours.  Net -210 over last 24, -970 snice admit.  Weight up 79.6 > 80.2 today 11/2 (Admission weight 79.4).  Lasix discontinued 10/31 d/t AKI.  Patient on 12L HFNC satting ~92% at rest.  Remains tachypneic. Increased work of breathing over last 24 hours. No lower extremity edema. -Cardiology following -Hold Lasix due to increasing creatinine (2.84 up to 3.2 11/2). -BiPAP as needed  CAP Remains afebrile. Currently on HFNC 12L satting  ~92%. Currently on cefepime and vancomycin IV.  CT chest impressive for multifocal pneumonia and emphysematous changes in the lungs. Blood cultures 10/29 NGTD, repeat cultures 10/31 NGTD. -Will perform MRSA PCR to assess need for vancomycin -Continue vancomycin (10/31-) -Continue cefepime (10/31-) -Monitor respiration status closely and maintain oxygen saturations greater than 92%.  NSTEMI Patient continues to deny chest pain. No other EKG changes. -Cardiology following appreciate recommendations.              -Plan for cardiac cath pending kidney functions and when more stable              -Continue ASA 81 mg              -Pravastatin 40 mg daily              -Plan for Heparin drip per pharmacy protocol when INR less than 2.5 -Cardiac monitoring  Mechanical aortic valve on chronic anticoagulation, acutely worse: INR 5.5>10 on 11/2! Most likely due to worsening liver function. Target INR 2.5-3.0. Patient with CKD III, worsening kidney function. Patient no longer receiving warfarin. -Vitamin K administered -Will follow INR  Hypertension SBP 95-122/60-111 overnight.  Home meds Norvasc 5 mg daily -continue to monitor BP -Continue Metoprolol 25 mg twice daily   AKI on CKD stage III Creatinine 3.2 on 11/2 Baseline 1.5 - Monitor daily  Chronic neck pain,stable. -Continue to monitor pain control -Stopping morphine for pain d/t kidney dysfunction -Patient can have Oxycodone q6 PRN pain  Dementia: Chronic, stable. Patient alert and orientated to person place and time this AM 11/2. -Monitor for delirium -Reorient as needed  Hyperlipidemia: Chronic, stable. -Continue Pravachol  [id number]. -Continue Flomax and Proscar  Normocytic anemia, chronic Asymptomatic. Hemoglobin 10.9 11/2, down from  12.6 on admission -Daily CBCs  FEN/GI: Heart healthy diet Prophylaxis:None, pt INR 10  Disposition: Pending patient and family wishes for treatment  Subjective:  Patient  states he feels "lousy" this morning, with increased work of breathing this AM on 12L HFNC. Denies any dizziness, headaches, vision changes, chest pain, N, V, or abdominal pains. Very pleasant patient.  Objective: Temp:  [97.3 F (36.3 C)-98 F (36.7 C)] 97.3 F (36.3 C) (11/02 0453) Pulse Rate:  [58-103] 58 (11/02 0453) Resp:  [16-25] 20 (11/02 0453) BP: (95-132)/(52-111) 122/111 (11/02 0453) SpO2:  [83 %-99 %] 83 % (11/02 0453) FiO2 (%):  [40 %-60 %] 40 % (11/01 1758) Weight:  [80.2 kg] 80.2 kg (11/02 0453) Physical Exam: General: non-toxic appearing Cardiovascular: RRR S1S2 present, distant heart sounds Respiratory: CTA bilaterally, moving air well, increased work of breathing Abdomen: nontender, normal bowel sounds appreciated Extremities: no cyanosis, deformity or edema appreciated to bilateral lower extremities, 2+ DP pulses bilaterally  Laboratory: Recent Labs  Lab 03/01/19 0334 03/02/19 0521 03/03/19 0513  WBC 15.0* 14.2* 15.3*  HGB 12.1* 11.4* 10.8*  HCT 36.3* 34.6* 33.3*  PLT 218 232 260   Recent Labs  Lab 03/01/19 0334 03/02/19 0521 03/02/19 1232 03/03/19 0513  NA 141 139  --  138  K 4.2 4.3  --  3.9  CL 112* 106  --  105  CO2 16* 19*  --  15*  BUN 42* 66*  --  83*  CREATININE 2.13* 2.84*  --  2.92*  CALCIUM 8.8* 9.1  --  8.6*  PROT 6.5 6.5 6.7 6.3*  BILITOT 2.4* 2.8* 2.8* 2.7*  ALKPHOS 45 47 48 44  ALT 20 242* 261* 251*  AST 43* 313* 297* 163*  GLUCOSE 114* 122*  --  94    INR: 10 as of 11/2  Imaging/Diagnostic Tests: No new imaging.  Daisy Floro, DO 03/04/2019, 6:44 AM PGY-2, [place] Intern pager: [phone], text pages welcome

## 2019-03-04 NOTE — Progress Notes (Addendum)
Progress Note  Patient Name: Juan Li Date of Encounter: 03/04/2019  Primary Cardiologist: Nelva Bush, MD   Subjective   Patient CODE status changed to DNR over the weekend. Palliative care on board. Respiratory status continues to worsen. Patient denies chest pain. States breathing is the same as yesterday.  Inpatient Medications    Scheduled Meds: . aspirin EC  81 mg Oral Daily  . atorvastatin  40 mg Oral q1800  . buPROPion  75 mg Oral Daily  . levalbuterol  0.63 mg Nebulization TID  . metoprolol tartrate  25 mg Oral BID  . predniSONE  40 mg Oral Q breakfast  . vancomycin variable dose per unstable renal function (pharmacist dosing)   Does not apply See admin instructions   Continuous Infusions: . ceFEPime (MAXIPIME) IV 2 g (03/03/19 1341)  . phytonadione (VITAMIN K) IV     PRN Meds: acetaminophen **OR** acetaminophen, levalbuterol, nitroGLYCERIN, ondansetron (ZOFRAN) IV, oxyCODONE   Vital Signs    Vitals:   03/03/19 2143 03/04/19 0035 03/04/19 0453 03/04/19 0929  BP: 116/67 95/60 (!) 122/111 105/63  Pulse: 83 77 (!) 58   Resp: 20 20 20 20   Temp: 98 F (36.7 C) (!) 97.4 F (36.3 C) (!) 97.3 F (36.3 C) 98 F (36.7 C)  TempSrc:  Oral Oral Oral  SpO2: 98% 99% (!) 83%   Weight:   80.2 kg   Height:        Intake/Output Summary (Last 24 hours) at 03/04/2019 0933 Last data filed at 03/03/2019 2156 Gross per 24 hour  Intake 240 ml  Output 450 ml  Net -210 ml   Last 3 Weights 03/04/2019 03/03/2019 03/02/2019  Weight (lbs) 176 lb 12.9 oz 175 lb 7.8 oz 171 lb 11.8 oz  Weight (kg) 80.2 kg 79.6 kg 77.9 kg      Telemetry    Was d/c'd yesterday - Personally Reviewed  ECG     No new- Personally Reviewed  Physical Exam   GEN: No acute distress.   Neck: No JVD Cardiac: RRR, no murmurs, rubs, or gallops.  Respiratory: Clear to auscultation bilaterally; Bipap; mild increased work of breathing GI: Soft, nontender, non-distended  MS: No edema; No  deformity. Neuro:  Nonfocal  Psych: Normal affect   Labs    High Sensitivity Troponin:   Recent Labs  Lab 02/28/19 2237 03/01/19 0334 03/01/19 0453 03/01/19 0658 03/01/19 1554  TROPONINIHS 12,409* 12,000* 11,676* 12,126* 9,409*      Chemistry Recent Labs  Lab 03/02/19 0521 03/02/19 1232 03/03/19 0513 03/04/19 0622  NA 139  --  138 139  K 4.3  --  3.9 5.1  CL 106  --  105 109  CO2 19*  --  15* 16*  GLUCOSE 122*  --  94 129*  BUN 66*  --  83* 107*  CREATININE 2.84*  --  2.92* 3.23*  CALCIUM 9.1  --  8.6* 8.5*  PROT 6.5 6.7 6.3* 6.0*  ALBUMIN 3.1* 3.2* 3.1* 2.8*  AST 313* 297* 163* 442*  ALT 242* 261* 251* 549*  ALKPHOS 47 48 44 55  BILITOT 2.8* 2.8* 2.7* 2.7*  GFRNONAA 19*  --  19* 17*  GFRAA 22*  --  22* 19*  ANIONGAP 14  --  18* 14     Hematology Recent Labs  Lab 03/02/19 0521 03/03/19 0513 03/04/19 0622  WBC 14.2* 15.3* 15.1*  RBC 3.61* 3.42* 3.40*  HGB 11.4* 10.8* 10.9*  HCT 34.6* 33.3* 33.7*  MCV 95.8  97.4 99.1  MCH 31.6 31.6 32.1  MCHC 32.9 32.4 32.3  RDW 14.6 14.7 14.7  PLT 232 260 296    BNP Recent Labs  Lab 02/28/19 1840  BNP 1,533.4*     DDimer  Recent Labs  Lab 02/28/19 0947  DDIMER <0.27     Radiology    Dg Chest 1 View  Result Date: 03/02/2019 CLINICAL DATA:  Shortness of breath. EXAM: CHEST  1 VIEW COMPARISON:  Chest x-ray March 02, 2019 FINDINGS: There is infiltrate within the right perihilar region again identified. There may be mild infiltrate in the left retrocardiac region as well. No pneumothorax. No other changes. IMPRESSION: Right-sided infiltrate worrisome for pneumonia. Possible left retrocardiac infiltrate as well. Recommend follow-up imaging to ensure resolution of findings. Electronically Signed   By: Dorise Bullion III M.D   On: 03/02/2019 13:13   Ct Chest Wo Contrast  Result Date: 03/02/2019 CLINICAL DATA:  Shortness of breath. EXAM: CT CHEST WITHOUT CONTRAST TECHNIQUE: Multidetector CT imaging of the  chest was performed following the standard protocol without IV contrast. COMPARISON:  Chest x-ray March 02, 2019 FINDINGS: Cardiovascular: The heart size is borderline. Coronary artery calcifications involve the right and left coronary arteries. Atherosclerotic changes are seen in the nonaneurysmal aorta. Central pulmonary arteries are normal in caliber. Mediastinum/Nodes: Thyroid and esophagus are unremarkable. There is a prominent subcarinal node measuring 12 mm in short axis. There is an 11 mm right pretracheal node and an 11 mm right paratracheal node on image 33. There is also a prominent node to the right of the esophagus at the thoracic inlet measuring 9 mm on series 3, image 18. Small bilateral pleural effusions are identified. No pericardial effusion. Lungs/Pleura: Multi lobar infiltrate is identified, right greater than left, particularly in the right upper lobe in the bilateral lower lobes. Emphysematous changes are identified in the lungs. No nodules or masses. Upper Abdomen: No acute abnormality. Musculoskeletal: No chest wall mass or suspicious bone lesions identified. IMPRESSION: 1. Bilateral pulmonary infiltrates are most consistent with multifocal pneumonia. Recommend short-term follow-up imaging to ensure resolution. 2. Mild adenopathy in the chest as above. These nodes may be reactive given the findings in the lungs. Recommend short-term follow-up imaging to ensure resolution. 3. Coronary artery calcifications. 4. Atherosclerotic changes in the nonaneurysmal aorta. 5. Emphysematous changes in the lungs. Aortic Atherosclerosis (ICD10-I70.0) and Emphysema (ICD10-J43.9). Electronically Signed   By: Dorise Bullion III M.D   On: 03/02/2019 13:21   US Abdomen Limited Ruq  Result Date: 03/02/2019 CLINICAL DATA:  Trans am in itis. EXAM: ULTRASOUND ABDOMEN LIMITED RIGHT UPPER QUADRANT COMPARISON:  None. FINDINGS: Gallbladder: No gallstones or wall thickening visualized. No sonographic Murphy sign  noted by sonographer. Common bile duct: Diameter: 4 mm Liver: No focal lesion identified. Within normal limits in parenchymal echogenicity. Portal vein is patent on color Doppler imaging with normal direction of blood flow towards the liver. Other: None. IMPRESSION: Normal right upper quadrant ultrasound. Electronically Signed   By: Lajean Manes M.D.   On: 03/02/2019 17:40    Cardiac Studies   Echocardiogram 02/28/2019 IMPRESSIONS  1. Left ventricular ejection fraction, by visual estimation, is 40 to 45%. The left ventricle has mildly decreased function. There is no left ventricular hypertrophy. 2. Mechanical aortic valve present. Vmax 1.4 m/s, mean gradient 4 mmHG. No significant abnormalities of the prosthetic valve. 3. Abnormal septal motion consistent with left bundle branch block. 4. Elevated left atrial pressure. 5. Left ventricular diastolic parameters are consistent with Grade  III diastolic dysfunction (restrictive). 6. The left ventricle demonstrates global hypokinesis. 7. Global right ventricle has mildly reduced systolic function.The right ventricular size is normal. No increase in right ventricular wall thickness. 8. Left atrial size was mildly dilated. 9. Right atrial size was normal. 10. Presence of pericardial fat pad. 11. The pericardial effusion is circumferential. 12. Trivial pericardial effusion is present. 13. Mild mitral annular calcification. 14. The mitral valve is degenerative. Mild mitral valve regurgitation. 15. The tricuspid valve is grossly normal. Tricuspid valve regurgitation is mild. 16. Aortic valve regurgitation is not visualized. 17. Bileaflet mechanical prosthesis in the aortic valve position. 18. The pulmonic valve was grossly normal. Pulmonic valve regurgitation is not visualized. 19. TR signal is inadequate for assessing pulmonary artery systolic pressure. 20. The inferior vena cava is normal in size with greater than 50% respiratory  variability, suggesting right atrial pressure of 3 mmHg.   Patient Profile     85 y.o. male  With hx of hyperlipidemia, HTN, dementia, chronic diastolic HF, s/p aortic valve replacement on chronic warfarin, chronic neck pain,  CKD stage III, coronary artery stenosis, and PAD who is being seen for chest pain now with multisystem organ failure.  Assessment & Plan    Acute respiratory failure/ Acute on chronic systolic and diastolic HF - EF [id number] with G3 restrictive DD and high LV filling pressures - BNP 8000 - Was on Lasix 80 IV BID, but has been held for worsening kidney function - Creatinine continues to rise despite holding Lasix. 2.84 > 2.92 > 3.23 - Plan was for cardiac cath this week, but was held for rising creatinine.  - Patient acutely declined 11/1 and required Bipap. - After discussion with Critical Care team about HD and intubation CODE status changed to DNR over the weekend. Palliative care on board.  - Continue BB as tolerated - Continue to hold Lasix.   NSTEMI - HS Troponin elevated to 12,000. Cath held for kidney function. Troponin now downtrending - Patient has denied chest pain/discomfort - No cath at this time - Continue ASA and BB  CAP - CT chest significant for bilateral pulmonary infiltrates, likely multifocal PNA; also with chronic changes - on IV abx per IM  - Bipap for respiratory support  Mechanical aortic valve - on chronic coumadin. Plan was to switch to heparin once INR less than 2.5 - INR > 10  - Warfarin per pharmacy  Acute Kidney Injury/CKD srate III - baseline creatinine aroun 1.5. On admission was 2.10.  - Creatinine continues to worsen. Today 3.23 - Do not plan to pursue HD at this time  Dementia - stable  Hyperlipidemia - LFTs were noted to be elevated>> thought to be due to MI. Abdominal US unremarkable.  - LFTs continue to worsen. Will hold statin   For questions or updates, please contact [place] Please consult  [email or web address] for contact info under        Signed, Cadence Ninfa Meeker, PA-C  03/04/2019, 9:33 AM    I have seen and examined the patient along with Cadence Ninfa Meeker, PA-C .  I have reviewed the chart, notes and new data.  I agree with PA/NP's note.  Key new complaints: Not feeling well, complains of a headache but is alert and has no focal neurological complaints Key examination changes: Mildly tachypneic, jugular veins are distended, no peripheral edema, clear lungs, regular rate and rhythm with crisp prosthetic valve sounds and no diastolic murmurs Key new findings / data: INR>10 and he has  been administered vitamin K.  Worsening creatinine now up to 3.2.  Mildly hyperkalemic.  LFTs are abnormal and rising, now roughly 10 times the upper limit of normal.  Bilirubin 2.7.  High-sensitivity troponin peaked at roughly 12,000 and is now declining.  ECG nondiagnostic due to left bundle branch block. On my review of his echocardiogram, left ventricular systolic function is worse than reported, EF probably 30-35% with extensive wall motion abnormality involving virtually the entire LAD territory.  This is superimposed on LBBB related dyssynchrony.  There is at least moderate to severe mitral insufficiency, likely secondary.  There is evidence of severely elevated right and left heart pressures and moderate pulmonary artery hypertension.  PLAN: I believe Mr. Dejoie has cardiogenic shock as a consequence of an extensive anterior wall myocardial infarction, complicated by parenchymal liver failure with severe hypoprothrombinemia and acute on chronic renal failure. His prognosis is grim. Even if it were possible, I doubt that cardiac catheterization would make a difference at this point.  He is not a candidate for invasive contrast based procedure due to acute renal failure and severely elevated INR. Note the decision to make him DNR.  I understand that his family is arriving from out of state to see him  before making further decisions regarding his care.  In my opinion he is appropriate for comfort care.  Sanda Klein, MD, [place] [phone] 03/04/2019, 11:48 AM

## 2019-03-04 NOTE — Progress Notes (Signed)
PT Cancellation Note  Patient Details Name: Juan Li MRN: [id number] DOB: 1931/05/24   Cancelled Treatment:    Reason Eval/Treat Not Completed: Medical issues which prohibited therapy. PT attempted to see pt for treatment however RN reporting pt on 12L HFNC with tenuous respiratory status. RN Mikle Bosworth requesting PT hold at this time. PT will attempt to follow up at a later time when pt is better able to tolerate functional mobility.  Zenaida Niece, PT, DPT Acute Rehabilitation Pager: [phone] 03/04/2019, 1:25 PM

## 2019-03-04 NOTE — Progress Notes (Signed)
Patient ID: Juan Li, male   DOB: 1932-02-17, 85 y.o.   MRN: [id number]  This NP visited patient at the bedside as a follow up for palliative medicine needs and emotional support.  Discussed with patient and his wife plan of care.  They tell me that they had a conversation with Dr Andria Frames and  decision has been made to shift to a full comfort approach once family arrives tonight.  I discussed with Dr Andria Frames and he tells me the attending team will place orders, reflecting comfort,  this evening once family arrive.  Wife is concerned with limited visitation knowing that patient prognosis is limited.  I personally discussed with nursing AD and relayed to family that four persons are allowed daily, two persons at the bedside at a time.  Emotional support offered.  Questions and concerns addressed   Discussed with Dr Andria Frames  Total time spent on the unit was 35 minutes     PMT will continue to support holistically  Greater than 50% of the time was spent in counseling and coordination of care  Wadie Lessen NP  Palliative Medicine Team Team Phone # [phone] Pager [phone]

## 2019-03-05 DIAGNOSIS — Z515 Encounter for palliative care: Secondary | ICD-10-CM

## 2019-03-05 LAB — CULTURE, BLOOD (ROUTINE X 2)
Culture: NO GROWTH
Culture: NO GROWTH
Special Requests: ADEQUATE

## 2019-03-05 IMAGING — DX DG CHEST 2V
2 series · 2 of 2 positions shown · non-contrast
Comparison: 08/15/2012

CLINICAL DATA: Chest pain.  Hypotension.  Leukocytosis.

EXAM:
CHEST - 2 VIEW

[chest ap]
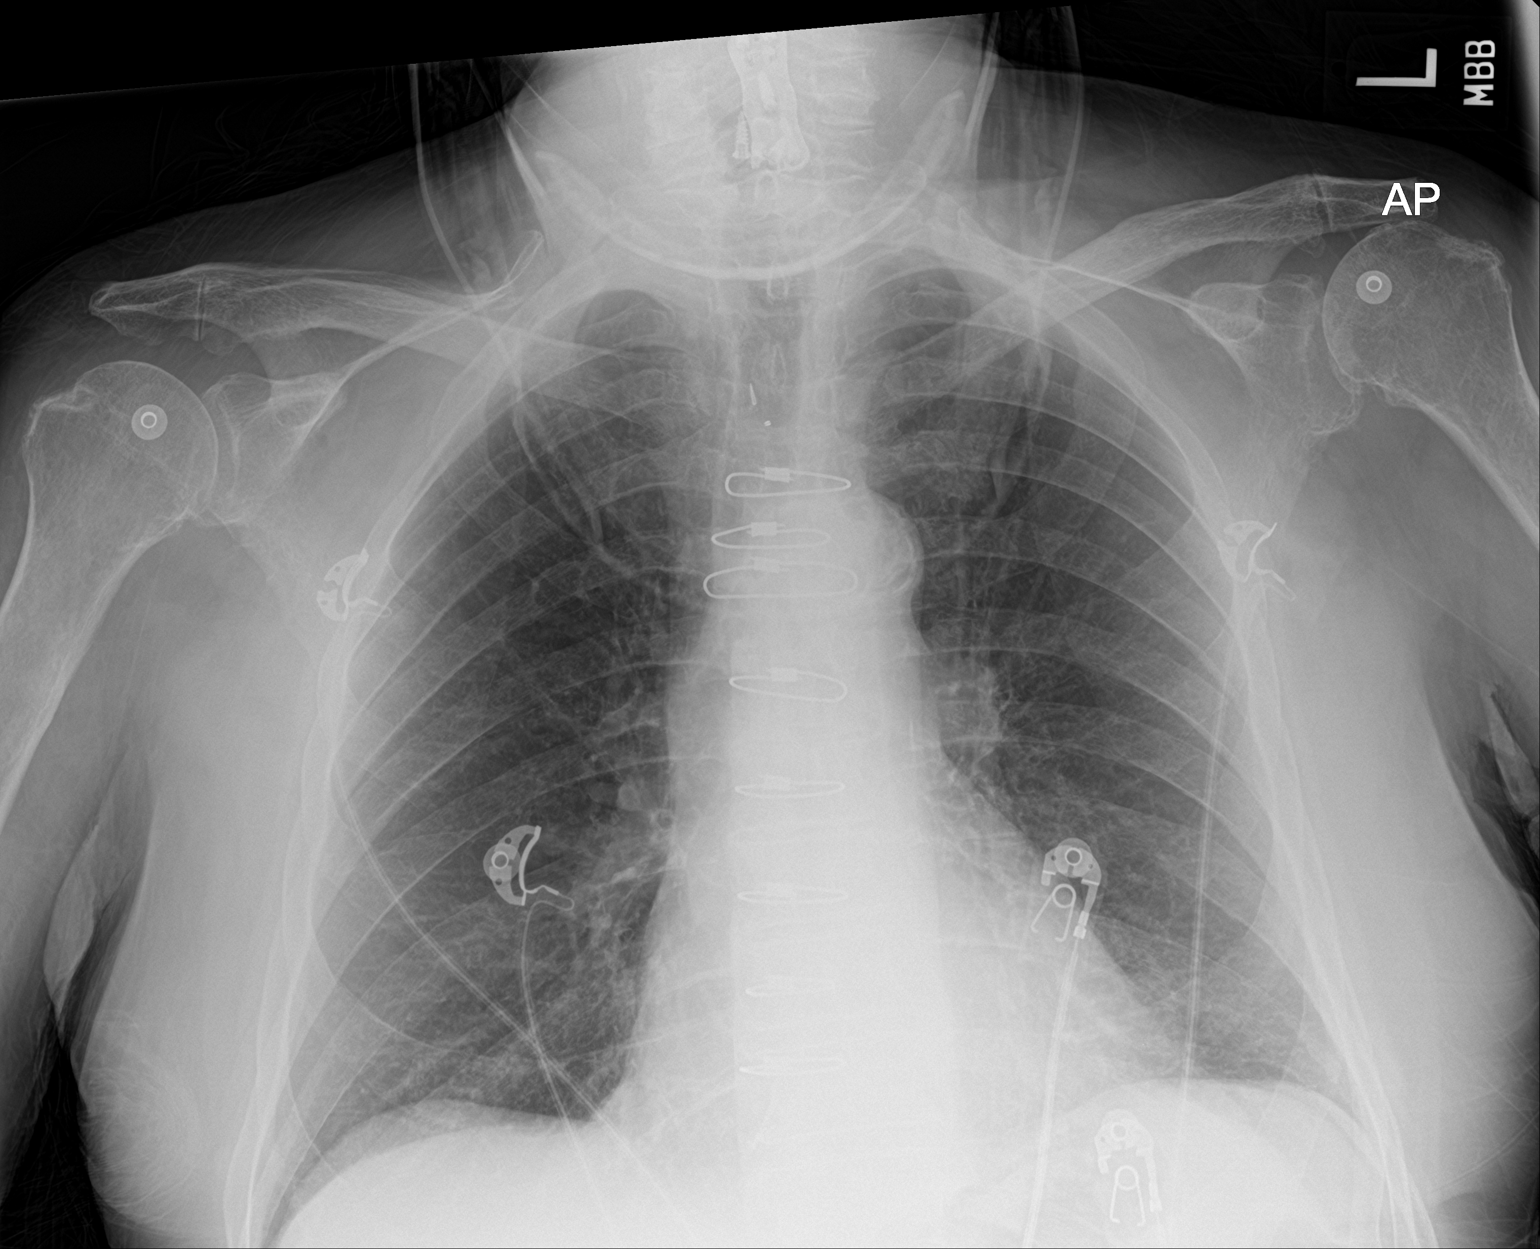

[chest lat]
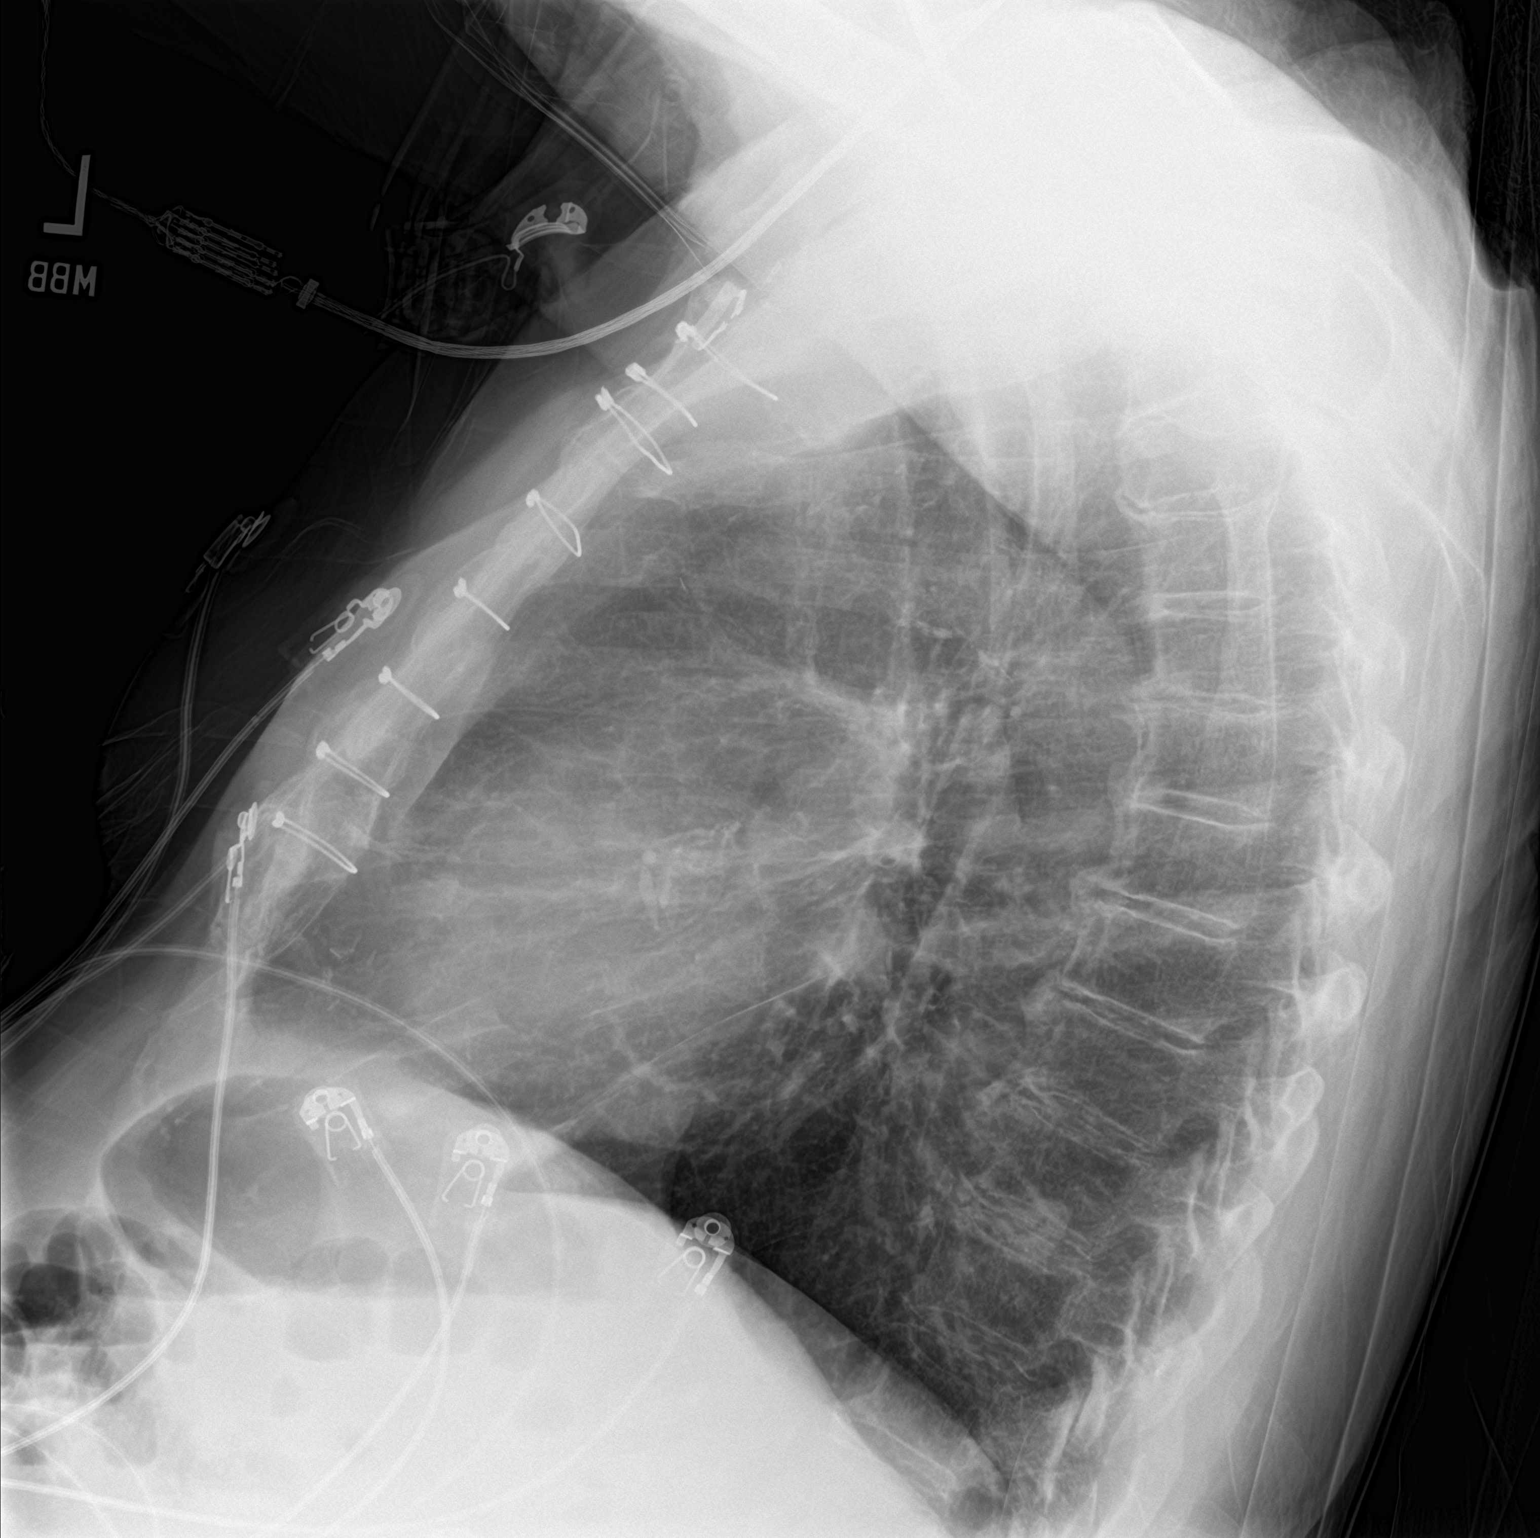

[2 of 2 positions shown; findings below may reference images not displayed]

FINDINGS: Sequelae of prior sternotomy are again identified. The
cardiomediastinal silhouette is within normal limits. Aortic
atherosclerosis is noted. No airspace consolidation, edema, pleural
effusion, pneumothorax is identified. The posterior costophrenic
angles were incompletely imaged. Prior cervical spine fusion is
noted.
IMPRESSION: No active cardiopulmonary disease.

## 2019-03-05 MED ORDER — PHENOL 1.4 % MT LIQD
1.0000 | OROMUCOSAL | Status: DC | PRN
  Administered 2019-03-05: 1 via OROMUCOSAL
  Filled 2019-03-05: qty 177

## 2019-03-05 NOTE — Progress Notes (Signed)
Family Medicine Teaching Service Daily Progress Note Intern Pager: [phone]  Patient name: Juan Li Medical record number: [id number] Date of birth: Apr 16, 1932 Age: 85 y.o. Gender: male  Primary Care Provider: Zenia Resides, MD Consultants: Cards Code Status: DNR  Pt Overview and Major Events to Date:  10/29-admit to hospital for shortness of breath generalized fatigue  Assessment and Plan: Juan Heckaman Purgasonis a 85 y.o.malepresenting with progressive generalized fatigue and shortness of breath,found to have infiltrate on x-ray concerning for pneumonia and elevated troponin. PMH is significant forhyperlipidemia, dementia, hypertension, heart failure with preserved ejection fraction, s/p aortic valve replacement on chronic anticoagulation with warfarin, chronic neck pain, CAD/PVD.  Multiorgan failure secondary to CAP and NSTEMI with poor prognosis: patient's health has unfortunately declined and is exhibiting signs and labs suggestive of multiple organ's failing.  After thorough discussions with palliative team and patient's PCP, Dr. Andria Frames, family is planning to do comfort care measures.  Additional monitoring, antibiotics and other therapeutics have been discontinued.   - We will continue to follow palliative recommendations as patient likely will transition to hospice care.  Dysphagia Patient says that he has some pain with swallowing.  Patient is actively eating and drinking his breakfast this morning without apparent issue. -Chloraseptic spray for throat  FEN/GI: Heart healthy diet Prophylaxis:None  Disposition: Likely pending palliative transition to hospice  Subjective:  Patient states that he is feeling okay.  Says that he does have trouble breathing but is not too bad.  Says he has some difficulty swallowing as he is eating his breakfast.  Has no other complaints.  Objective: Temp:  [97.2 F (36.2 C)-98 F (36.7 C)] 97.2 F (36.2 C) (11/02 1221) Pulse  Rate:  [51-70] 56 (11/03 0511) Resp:  [18-22] 18 (11/03 0511) BP: (91-108)/(54-63) 102/54 (11/03 0511) SpO2:  [96 %-100 %] 96 % (11/03 0511) Weight:  [81 kg] 81 kg (11/03 0511) Physical Exam: General: non-toxic appearing, sitting up eating breakfast Cardiovascular: RRR S1S2 present, distant heart sounds Respiratory: CTA bilaterally, moving air well, increased work of breathing Abdomen: nontender, normal bowel sounds appreciated Extremities: no cyanosis, deformity or edema appreciated to bilateral lower extremities  Laboratory: Recent Labs  Lab 03/02/19 0521 03/03/19 0513 03/04/19 0622  WBC 14.2* 15.3* 15.1*  HGB 11.4* 10.8* 10.9*  HCT 34.6* 33.3* 33.7*  PLT 232 260 296   Recent Labs  Lab 03/02/19 0521 03/02/19 1232 03/03/19 0513 03/04/19 0622  NA 139  --  138 139  K 4.3  --  3.9 5.1  CL 106  --  105 109  CO2 19*  --  15* 16*  BUN 66*  --  83* 107*  CREATININE 2.84*  --  2.92* 3.23*  CALCIUM 9.1  --  8.6* 8.5*  PROT 6.5 6.7 6.3* 6.0*  BILITOT 2.8* 2.8* 2.7* 2.7*  ALKPHOS 47 48 44 55  ALT 242* 261* 251* 549*  AST 313* 297* 163* 442*  GLUCOSE 122*  --  94 129*    INR: 10 as of 11/2  Imaging/Diagnostic Tests: No new imaging.  Bonnita Hollow, MD 03/05/2019, 6:24 AM PGY-3, [place] Intern pager: [phone], text pages welcome

## 2019-03-05 NOTE — Care Management Important Message (Signed)
Important Message  Patient Details  Name: RENFORD GRAMMATICO MRN: [id number] Date of Birth: 23-Jun-1931   Medicare Important Message Given:  Yes     Shelda Altes 03/05/2019, 3:10 PM

## 2019-03-05 NOTE — Progress Notes (Signed)
  Plans for comfort care noted. Please call with any additional questions.  [facility] will sign off.

## 2019-03-05 NOTE — Progress Notes (Signed)
Patient ID: Juan Li, male   DOB: Jun 09, 1931, 85 y.o.   MRN: [id number]  This NP visited patient at the bedside as a follow up for palliative medicine needs and emotional support.  Patient remains weak, focus of care is comfort  Spoke to wife and son/Juan Li by telephone, for continued conversation regarding current medical situation and treatment plan.  As discussed with Dr Andria Frames yesterday, family verbalize understanding of limited prognosis and desire that treatment plan focus on comfort.  Plan of Care -DNR/DNI, no BiPap -no artifical feeding or hydration now or in the future - no further labs, diagnostics, life prolonging measures, allow for a natural death  -symptom management- Morphine/Ativan  Family plan to continue discussion tonight regarding next transitions of care;  home with hospice vs residential hospice (if patient meets eligibility)  Emotional support offered.  Questions and concerns addressed   Discussed with Dr Ouida Sills   Total time spent on the unit was 45 minutes     PMT will continue to support holistically  Greater than 50% of the time was spent in counseling and coordination of care  Wadie Lessen NP  Palliative Medicine Team Team Phone # [phone] Pager [phone]

## 2019-03-05 NOTE — Progress Notes (Signed)
Transferred from 4 East via bed, alert,oriented x4, with O2 via nasal cannula. Denies pain at this time

## 2019-03-05 NOTE — Progress Notes (Signed)
Called report for patient-  Spoke to nurse on [place]

## 2019-03-06 DIAGNOSIS — R627 Adult failure to thrive: Secondary | ICD-10-CM

## 2019-03-06 DIAGNOSIS — K7689 Other specified diseases of liver: Secondary | ICD-10-CM

## 2019-03-06 DIAGNOSIS — D689 Coagulation defect, unspecified: Secondary | ICD-10-CM

## 2019-03-06 NOTE — Progress Notes (Signed)
I visited Juan Li.  He is comfortable.and sleeping.  VS noted.  Skin color poor.  Respirations are not labored.  He does not appear to be dying in hours.  As such, I agree that we should be doing dispo planning with family and palliative care.

## 2019-03-06 NOTE — Progress Notes (Addendum)
Family Medicine Teaching Service Daily Progress Note Intern Pager: [phone]  Patient name: Juan Li Medical record number: [id number] Date of birth: 11/25/1931 Age: 85 y.o. Gender: male  Primary Care Provider: [name], William A, MD Consultants: Cardiology, Palliative Code Status: DNR  Pt Overview and Major Events to Date:  10/29 - Admitted for shortness of breath and generalized fatigue 11/1 - DNR 11/2 - Family decided for comfort care  Assessment and Plan: Juan Liis a 85 y.o.malepresenting with progressive generalized fatigue and shortness of breath,found to have infiltrate on x-ray concerning for pneumonia and elevated troponin. PMH is significant forhyperlipidemia, dementia, hypertension, heart failure with preserved ejection fraction, s/p aortic valve replacement on chronic anticoagulation with warfarin, chronic neck pain, CAD/PVD.He is now full comfort care.  Multiorgan failure secondary to CAP and NSTEMI with poor prognosis: patient's health has unfortunately declined. His labs, vitals, and clinical appearance is suggestive of multiple organ's failing. Family and patient are now opting for full comfort care.  Additional monitoring, antibiotics and other therapeutics have been discontinued. The patient's family met as a group on 11/3 to discuss wishes and disposition. - Palliative consulted - appreciate recs, patient will likely transition to hospice care but are awaiting final decision from family 11/4  Throat soreness Patient reported sore throat 11/3, is slightly improved today. -Chloraseptic spray for throat  FEN/GI: Regular diet Prophylaxis:None  Disposition: Pending palliative consult, patient is adamant he does NOT want to be placed in a nursing home  Subjective:  Patient sleeping heavily, snoring with [place] in place. No complaints this morning.  Objective: Temp:  [97.3 F (36.3 C)-97.6 F (36.4 C)] 97.3 F (36.3 C) (11/04 0506) Pulse Rate:   [53-59] 59 (11/04 0506) Resp:  [12-15] 15 (11/03 1631) BP: (100-116)/(53-85) 116/85 (11/04 0506) SpO2:  [93 %-97 %] 96 % (11/04 0506) Physical Exam: General: no apparent distress, [place] in place Cardiovascular: RRR, S1S2 present Respiratory: heavy, labored breathing on [place] while asleep Abdomen: soft, normal bowel sounds Extremities: no edema appreciated  Laboratory: Recent Labs  Lab 03/02/19 0521 03/03/19 0513 03/04/19 0622  WBC 14.2* 15.3* 15.1*  HGB 11.4* 10.8* 10.9*  HCT 34.6* 33.3* 33.7*  PLT 232 260 296   Recent Labs  Lab 03/02/19 0521 03/02/19 1232 03/03/19 0513 03/04/19 0622  NA 139  --  138 139  K 4.3  --  3.9 5.1  CL 106  --  105 109  CO2 19*  --  15* 16*  BUN 66*  --  83* 107*  CREATININE 2.84*  --  2.92* 3.23*  CALCIUM 9.1  --  8.6* 8.5*  PROT 6.5 6.7 6.3* 6.0*  BILITOT 2.8* 2.8* 2.7* 2.7*  ALKPHOS 47 48 44 55  ALT 242* 261* 251* 549*  AST 313* 297* 163* 442*  GLUCOSE 122*  --  94 129*   Imaging/Diagnostic Tests: No new imaging  ,  C, DO 03/06/2019, 8:18 AM PGY-2,  Family Medicine FPTS Intern pager: [phone], text pages welcome  

## 2019-03-07 DIAGNOSIS — R0600 Dyspnea, unspecified: Secondary | ICD-10-CM

## 2019-03-07 LAB — CULTURE, BLOOD (SINGLE): Culture: NO GROWTH

## 2019-03-07 NOTE — Progress Notes (Signed)
Patient ID: Juan Li, male   DOB: 10/29/1931, 85 y.o.   MRN: [id number]  This NP visited patient at the bedside as a follow up for palliative medicine needs and emotional support.  Patient remains weak, minimally responsive and only taking chips and sips as p.o. intake,  focus of care is comfort and dignity.  Spoke to wife  by telephone, for continued conversation regarding current medical situation and treatment plan.  Wife understands that the patient continues to fail to thrive,  family verbalize understanding of limited prognosis and desire that treatment plan focus on comfort and dignity. She has been able to visit him in the hospital along with several family members from out of town.  For this she is grateful.  Plan of Care -DNR/DNI, no BiPap -no artifical feeding or hydration now or in the future - no further labs, diagnostics, life prolonging measures, allow for a natural death  -symptom management- Morphine/Ativan -Family is hopeful for residential hospice for end-of-life care.    Prognosis is less than 2 weeks  Discussed the natural trajectory and expectations at end of life.  Emotional support offered.  Questions and concerns addressed   Discussed with Dr Ouida Sills and Dr. Owens Shark  Total time spent on the unit was 40 minutes     PMT will continue to support holistically  Greater than 50% of the time was spent in counseling and coordination of care  Wadie Lessen NP  Palliative Medicine Team Team Phone # [phone] Pager [phone]

## 2019-03-07 NOTE — Progress Notes (Signed)
Nutrition Brief Note  Chart reviewed. Pt now transitioning to comfort care.  No further nutrition interventions warranted at this time.  Please re-consult as needed.   Rian Koon A. Cadden Elizondo, RD, LDN, CDCES Registered Dietitian II Certified Diabetes Care and Education Specialist Pager: [phone] After hours Pager: [phone]  

## 2019-03-07 NOTE — Progress Notes (Signed)
Manufacturing engineer [facility]) [facility] Liaison Note   Received request from TOC/CSW Alinda Sierras for family interest in [facility]. Chart reviewed. Unable to reach spouse but left voice message to acknowledge referral.   Unfortunately [facility] is not able to offer a room today. Family and CSW are aware Manufacturing engineer liaison will follow up with CSW and family tomorrow or sooner if room becomes available.   Please do not hesitate to call with questions. Thank you.   Gar Ponto, RN [facility] Liaison  [place] are on AMION

## 2019-03-07 NOTE — Social Work (Addendum)
CSW acknowledging consult for [facility] per PMT conversations with pt wife.  Will initiate referral to [facility] for bed placement, referral made to [employer]. Will also reach out to pt wife to let her know referral has been made.    Westley Hummer, MSW, [place] Work [phone]

## 2019-03-07 NOTE — Progress Notes (Signed)
Patient ID: Juan Li, male   DOB: July 30, 1931, 85 y.o.   MRN: [id number]  This NP visited patient at the bedside as a follow up for palliative medicine needs and emotional support.  Patient remains weak, normally responsive, focus of care is comfort  Please call to family and await callback for continued conversation regarding transition of care.  Plan of Care -DNR/DNI, no BiPap -no artifical feeding or hydration now or in the future - no further labs, diagnostics, life prolonging measures, allow for a natural death  -symptom management- Morphine/Ativan   Questions and concerns addressed   Discussed with Dr Ouida Sills   Total time spent on the unit was 15 minutes     PMT will continue to support holistically  Greater than 50% of the time was spent in counseling and coordination of care  Wadie Lessen NP  Palliative Medicine Team Team Phone # [phone] Pager [phone]

## 2019-03-07 NOTE — Progress Notes (Signed)
Family Medicine Teaching Service Daily Progress Note Intern Pager: [phone]  Patient name: Juan Li Medical record number: [id number] Date of birth: [birth year] Age: 85 y.o. Gender: male  Primary Care Provider: Zenia Resides, MD Consultants: Palliative Code Status: DNR   Pt Overview and Major Events to Date:  Hospital Day: 8 10/29 - Admitted for shortness of breath and generalized fatigue 11/1 - DNR 11/2 - Family decided for comfort care  Assessment and Plan: Juan Li a 85 y.o.malepresenting with progressive generalized fatigue and shortness of breath,found to have infiltrate on x-ray concerning for pneumonia and elevated troponin. PMH is significant forhyperlipidemia, dementia, hypertension, heart failure with preserved ejection fraction, s/p aortic valve replacement on chronic anticoagulation with warfarin, chronic neck pain, CAD/PVD.He is now full comfort care.  Multiorgan failuresecondary to CAP and NSTEMI withpoor prognosis and Comfort Care Family and patient are now opting for full comfort care.Additional monitoring, antibiotics and other therapeutics have been discontinued. Will need to follow up with family about discharge today.   Appreciate Palliative recs   Throat soreness -Chloraseptic spray for throat  FEN/GI: Regular diet Prophylaxis:None  Disposition: Pending palliative consult, patient is adamant he does NOT want to be placed in a nursing home  Subjective:  Sleeping this morning.   Objective: Temp:  [97.5 F (36.4 C)] 97.5 F (36.4 C) (11/05 0504) Pulse Rate:  [68] 68 (11/05 0504) Resp:  [22] 22 (11/05 0504) BP: (109)/(63) 109/63 (11/05 0504) SpO2:  [92 %] 92 % (11/05 0504) Intake/Output      11/04 0701 - 11/05 0700   P.O. 0   Total Intake(mL/kg) 0 (0)   Urine (mL/kg/hr) 325 (0.2)   Total Output 325   Net -325           Physical Exam: General: NAD, non-toxic, well-appearing, sleeping comfortably supine with mouth  open and snoring. Appears comfortable.  Respiratory: No IWOB appreciated   Laboratory: I have personally read and reviewed all labs and imaging studies.  CBC: Recent Labs  Lab 02/28/19 0920 03/01/19 0334 03/02/19 0521 03/03/19 0513 03/04/19 0622  WBC 13.4* 15.0* 14.2* 15.3* 15.1*  NEUTROABS 11.5* 12.4* 11.8*  --   --   HGB 12.6* 12.1* 11.4* 10.8* 10.9*  HCT 38.5* 36.3* 34.6* 33.3* 33.7*  MCV 98.7 96.0 95.8 97.4 99.1  PLT 205 218 232 260 296   CMP: Recent Labs  Lab 03/02/19 0521 03/02/19 1232 03/03/19 0513 03/04/19 0622  NA 139  --  138 139  K 4.3  --  3.9 5.1  CL 106  --  105 109  CO2 19*  --  15* 16*  GLUCOSE 122*  --  94 129*  BUN 66*  --  83* 107*  CREATININE 2.84*  --  2.92* 3.23*  CALCIUM 9.1  --  8.6* 8.5*  ALBUMIN 3.1* 3.2* 3.1* 2.8*   CBG: Recent Labs  Lab 03/04/19 2136  GLUCAP 106*   Micro: Covid Negative  Recent Results (from the past 240 hour(s))  Blood Culture (routine x 2)     Status: None   Collection Time: 02/28/19 11:05 AM   Specimen: BLOOD RIGHT ARM  Result Value Ref Range Status   Specimen Description BLOOD RIGHT ARM  Final   Special Requests   Final    BOTTLES DRAWN AEROBIC AND ANAEROBIC Blood Culture results may not be optimal due to an excessive volume of blood received in culture bottles   Culture   Final    NO GROWTH 5 DAYS Performed at  [facility], [place] [address] [place], [place] [address]    Report Status 03/05/2019 FINAL  Final  SARS CORONAVIRUS 2 (TAT 6-24 HRS) Nasopharyngeal Nasopharyngeal Swab     Status: None   Collection Time: 02/28/19 11:10 AM   Specimen: Nasopharyngeal Swab  Result Value Ref Range Status   SARS Coronavirus 2 NEGATIVE NEGATIVE Final    Comment: (NOTE) SARS-CoV-2 target nucleic acids are NOT DETECTED. The SARS-CoV-2 RNA is generally detectable in upper and lower respiratory specimens during the acute phase of infection. Negative results do not preclude SARS-CoV-2 infection, do not rule  out co-infections with other pathogens, and should not be used as the sole basis for treatment or other patient management decisions. Negative results must be combined with clinical observations, patient history, and epidemiological information. The expected result is Negative. Fact Sheet for Patients: SugarRoll.be Fact Sheet for Healthcare Providers: https://www.woods-mathews.com/ This test is not yet approved or cleared by the [place] FDA and  has been authorized for detection and/or diagnosis of SARS-CoV-2 by FDA under an Emergency Use Authorization (EUA). This EUA will remain  in effect (meaning this test can be used) for the duration of the COVID-19 declaration under Section 56 4(b)(1) of the Act, 21 U.S.C. section 360bbb-3(b)(1), unless the authorization is terminated or revoked sooner. Performed at [facility], [place] [address] [place], [place] [address]   Blood Culture (routine x 2)     Status: None   Collection Time: 02/28/19 11:17 AM   Specimen: BLOOD LEFT HAND  Result Value Ref Range Status   Specimen Description BLOOD LEFT HAND  Final   Special Requests   Final    BOTTLES DRAWN AEROBIC AND ANAEROBIC Blood Culture adequate volume   Culture   Final    NO GROWTH 5 DAYS Performed at [facility], [place] [address], [place], [place] [address]    Report Status 03/05/2019 FINAL  Final  Urine culture     Status: Abnormal   Collection Time: 02/28/19  6:15 PM   Specimen: In/Out Cath Urine  Result Value Ref Range Status   Specimen Description IN/OUT CATH URINE  Final   Special Requests   Final    NONE Performed at [facility], [place] [address] [place], [place] [address]    Culture MULTIPLE SPECIES PRESENT, SUGGEST RECOLLECTION (A)  Final   Report Status 03/01/2019 FINAL  Final  SARS CORONAVIRUS 2 (TAT 6-24 HRS) Nasopharyngeal Nasopharyngeal Swab     Status: None   Collection Time: 02/28/19  7:29 PM   Specimen:  Nasopharyngeal Swab  Result Value Ref Range Status   SARS Coronavirus 2 NEGATIVE NEGATIVE Final    Comment: (NOTE) SARS-CoV-2 target nucleic acids are NOT DETECTED. The SARS-CoV-2 RNA is generally detectable in upper and lower respiratory specimens during the acute phase of infection. Negative results do not preclude SARS-CoV-2 infection, do not rule out co-infections with other pathogens, and should not be used as the sole basis for treatment or other patient management decisions. Negative results must be combined with clinical observations, patient history, and epidemiological information. The expected result is Negative. Fact Sheet for Patients: SugarRoll.be Fact Sheet for Healthcare Providers: https://www.woods-mathews.com/ This test is not yet approved or cleared by the [place] FDA and  has been authorized for detection and/or diagnosis of SARS-CoV-2 by FDA under an Emergency Use Authorization (EUA). This EUA will remain  in effect (meaning this test can be used) for the duration of the COVID-19 declaration  under Section 56 4(b)(1) of the Act, 21 U.S.C. section 360bbb-3(b)(1), unless the authorization is terminated or revoked sooner. Performed at [facility], [place] [address], [place], [place] [address]   Culture, blood (single)     Status: None (Preliminary result)   Collection Time: 03/02/19  1:28 PM   Specimen: BLOOD LEFT WRIST  Result Value Ref Range Status   Specimen Description BLOOD LEFT WRIST  Final   Special Requests   Final    BOTTLES DRAWN AEROBIC ONLY Blood Culture results may not be optimal due to an inadequate volume of blood received in culture bottles   Culture   Final    NO GROWTH 4 DAYS Performed at [facility], [place] [address], [place], [place] [address]    Report Status PENDING  Incomplete  Culture, Urine     Status: Abnormal   Collection Time: 03/02/19  4:57 PM   Specimen: Urine, Catheterized   Result Value Ref Range Status   Specimen Description URINE, CATHETERIZED  Final   Special Requests NONE  Final   Culture (A)  Final    <10,000 COLONIES/mL INSIGNIFICANT GROWTH Performed at [facility], [place] [address] [place], [place] [address]    Report Status 03/03/2019 FINAL  Final  MRSA PCR Screening     Status: None   Collection Time: 03/04/19 11:23 AM   Specimen: Nasopharyngeal  Result Value Ref Range Status   MRSA by PCR NEGATIVE NEGATIVE Final    Comment:        The GeneXpert MRSA Assay (FDA approved for NASAL specimens only), is one component of a comprehensive MRSA colonization surveillance program. It is not intended to diagnose MRSA infection nor to guide or monitor treatment for MRSA infections. Performed at [facility], [place] [address], [place],  [address]      Imaging/Diagnostic Tests: No results found.  EKG Interpretation  Date/Time:  Thursday February 28 2019 10:23:55 EDT Ventricular Rate:  92 PR Interval:    QRS Duration: 122 QT Interval:  374 QTC Calculation: 463 R Axis:   -54 Text Interpretation: Sinus rhythm Left bundle branch block No significant change since last tracing Abnormal ECG Confirmed by Carmin Muskrat [phone]) on 03/01/2019 7:33:37 PM        Procedures:    Wilber Oliphant, MD 03/07/2019, 6:39 AM PGY-2, [place] Intern pager: [phone], text pages welcome

## 2019-03-08 MED ORDER — ACETAMINOPHEN 650 MG RE SUPP: 650.0000 mg | Freq: Four times a day (QID) | RECTAL | 0 refills | Status: AC | PRN

## 2019-03-08 MED ORDER — ONDANSETRON HCL 4 MG/5ML PO SOLN: 4.0000 mg | Freq: Three times a day (TID) | ORAL | 0 refills | Status: AC | PRN

## 2019-03-08 MED ORDER — PHENOL 1.4 % MT LIQD: 1.0000 | OROMUCOSAL | 5 refills | Status: AC | PRN

## 2019-03-08 MED ORDER — LORAZEPAM 2 MG/ML PO CONC: 1.0000 mg | ORAL | 0 refills | Status: AC | PRN

## 2019-03-08 MED ORDER — MORPHINE SULFATE (PF) 2 MG/ML IV SOLN: 1.0000 mg | INTRAVENOUS | 0 refills | Status: AC | PRN

## 2019-03-08 NOTE — Social Work (Signed)
Clinical Social Worker facilitated patient discharge including contacting patient family and facility to confirm patient discharge plans.  Clinical information faxed to facility and family agreeable with plan.  CSW arranged ambulance transport via PTAR to [facility] RN to call [phone]  with report prior to discharge.  Clinical Social Worker will sign off for now as social work intervention is no longer needed. Please consult us again if new need arises.  Jo Cerone, MSW, LCSWA Clinical Social Worker [phone]  

## 2019-03-08 NOTE — TOC Transition Note (Signed)
Transition of Care [facility]) - CM/SW Discharge Note   Patient Details  Name: [name] MRN: [id number] Date of Birth: Sep 03, 1931  Transition of Care [facility]) CM/SW Contact:  [place], [place] Phone Number: 03/08/2019, 11:16 AM   Clinical Narrative:    CSW awaits completion of [place] paperwork and summary from MD prior to transfer. Will fax information to [facility] when available and arrange PTAR.    Final next level of care: [place] Barriers to Discharge: Barriers Resolved   Patient Goals and CMS Choice Patient states their goals for this hospitalization and ongoing recovery are:: comfort for him CMS Medicare.gov Compare Post Acute Care list provided to:: Patient Choice offered to / list presented to : Spouse  Discharge Placement              Patient chooses bed at: Other - please specify in the comment section below:([facility]) Patient to be transferred to facility by: [place] Name of family member notified: pt wife Patient and family notified of of transfer: 03/08/19  Discharge Plan and Services In-house Referral: NA Discharge Planning Services: CM Consult Post Acute Care Choice: Home Health          DME Arranged: (NA) HH Arranged: PT HH Agency: [place] Date [facility] Agency Contacted: 03/01/19 Time [place]: [place] Representative spoke with at [place]: [place] ([place]) Interventions     Readmission Risk Interventions No flowsheet data found.

## 2019-03-08 NOTE — Care Management Important Message (Signed)
Important Message  Patient Details  Name: DAIEL TESSMER MRN: [id number] Date of Birth: January 20, 1932   Medicare Important Message Given:  Yes     Shelda Altes 03/08/2019, 3:18 PM

## 2019-03-08 NOTE — Progress Notes (Signed)
Engineer, maintenance [facility]) Hospital Liaison note.   Received request from  Westley Hummer, [place] for family interest in [place] with request for transfer today. Chart reviewed and eligibility confirmed. Met family to confirm interest and explain services. Family agreeable to transfer today. CSW aware.  Registration paper work completed. Dr. Orpah Melter (or whoever) to assume care per family request.  RN please call report to [phone]. Please arrange transport for patient.    Thank you,      Farrel Gordon, RN, [facility]   [place]     [place] are on AMION

## 2019-03-08 NOTE — Progress Notes (Signed)
Family Medicine Teaching Service Daily Progress Note Intern Pager: [phone]  Patient name: Juan Li Medical record number: [id number] Date of birth: Mar 24, 1932 Age: 85 y.o. Gender: male  Primary Care Provider: Zenia Resides, MD Consultants: Palliative Code Status: DNR  Pt Overview and Major Events to Date:  10/29 - Admitted for shortness of breath and generalized fatigue 11/1 - DNR 11/2 - Family decided for comfort care  Assessment and Plan: Juan Li a 85 y.o.malepresenting with progressive generalized fatigue and shortness of breath,found to have infiltrate on x-ray concerning for pneumonia and elevated troponin. PMH is significant forhyperlipidemia, dementia, hypertension, heart failure with preserved ejection fraction, s/p aortic valve replacement on chronic anticoagulation with warfarin, chronic neck pain, CAD/PVD.He is now full comfort care.  Multiorgan failuresecondary to CAP and NSTEMI withpoor prognosis and Comfort Care Family and patient are now opting for full comfort care.Additional monitoring, antibiotics and other therapeutics have been discontinued. -Palliative on board - patient's family opting for residential hospice  Throat soreness -Chloraseptic spray for throat  FEN/GI:Regulardiet, patient only consuming soft foods and liquids Prophylaxis:None  Disposition: Pending palliative consult, patient is adamant he does NOT want to be placed in a nursing home  Subjective:  Seen this morning resting in bed, lying in supine position, mouth open this morning.  Objective: Temp:  [98.1 F (36.7 C)] 98.1 F (36.7 C) (11/06 0615) Pulse Rate:  [76] 76 (11/06 0615) BP: (110)/(48) 110/48 (11/06 0615) SpO2:  [93 %] 93 % (11/06 0615)  Physical Exam: General: resting in bed, nontoxic-appearing, no apparent distress Cardiovascular: Irregular rhythm, rate controlled, no murmurs appreciated Respiratory: Moving air well, occasional coarse  breath sounds, difficult to auscultate due to snoring Abdomen: Sounds appreciated Extremities: 2+ DP pulses to bilateral lower extremities, no peripheral edema  Laboratory: Recent Labs  Lab 03/02/19 0521 03/03/19 0513 03/04/19 0622  WBC 14.2* 15.3* 15.1*  HGB 11.4* 10.8* 10.9*  HCT 34.6* 33.3* 33.7*  PLT 232 260 296   Recent Labs  Lab 03/02/19 0521 03/02/19 1232 03/03/19 0513 03/04/19 0622  NA 139  --  138 139  K 4.3  --  3.9 5.1  CL 106  --  105 109  CO2 19*  --  15* 16*  BUN 66*  --  83* 107*  CREATININE 2.84*  --  2.92* 3.23*  CALCIUM 9.1  --  8.6* 8.5*  PROT 6.5 6.7 6.3* 6.0*  BILITOT 2.8* 2.8* 2.7* 2.7*  ALKPHOS 47 48 44 55  ALT 242* 261* 251* 549*  AST 313* 297* 163* 442*  GLUCOSE 122*  --  94 129*    Imaging/Diagnostic Tests: No new imaging  Juan Floro, DO 03/08/2019, 9:43 AM PGY-2, [place] Intern pager: [phone], text pages welcome

## 2019-03-08 NOTE — Plan of Care (Signed)
  Problem: Education: Goal: Knowledge of General Education information will improve Description: Including pain rating scale, medication(s)/side effects and non-pharmacologic comfort measures Outcome: Adequate for Discharge   Problem: Health Behavior/Discharge Planning: Goal: Ability to manage health-related needs will improve Outcome: Adequate for Discharge   Problem: Clinical Measurements: Goal: Ability to maintain clinical measurements within normal limits will improve Outcome: Adequate for Discharge Goal: Will remain free from infection Outcome: Adequate for Discharge Goal: Diagnostic test results will improve Outcome: Adequate for Discharge Goal: Respiratory complications will improve Outcome: Adequate for Discharge Goal: Cardiovascular complication will be avoided Outcome: Adequate for Discharge   Problem: Activity: Goal: Risk for activity intolerance will decrease Outcome: Adequate for Discharge   Problem: Nutrition: Goal: Adequate nutrition will be maintained Outcome: Adequate for Discharge   Problem: Coping: Goal: Level of anxiety will decrease Outcome: Adequate for Discharge   Problem: Elimination: Goal: Will not experience complications related to bowel motility Outcome: Adequate for Discharge Goal: Will not experience complications related to urinary retention Outcome: Adequate for Discharge   Problem: Pain Managment: Goal: General experience of comfort will improve Outcome: Adequate for Discharge   Problem: Safety: Goal: Ability to remain free from injury will improve Outcome: Adequate for Discharge   Problem: Skin Integrity: Goal: Risk for impaired skin integrity will decrease Outcome: Adequate for Discharge   Problem: Acute Rehab PT Goals(only PT should resolve) Goal: Pt Will Go Supine/Side To Sit Outcome: Adequate for Discharge Goal: Patient Will Perform Sitting Balance Outcome: Adequate for Discharge Goal: Patient Will Transfer Sit To/From  Stand Outcome: Adequate for Discharge Goal: Pt Will Ambulate Outcome: Adequate for Discharge Goal: Pt Will Go Up/Down Stairs Outcome: Adequate for Discharge   Problem: Education: Goal: Ability to demonstrate management of disease process will improve Outcome: Adequate for Discharge Goal: Ability to verbalize understanding of medication therapies will improve Outcome: Adequate for Discharge Goal: Individualized Educational Video(s) Outcome: Adequate for Discharge   Problem: Activity: Goal: Capacity to carry out activities will improve Outcome: Adequate for Discharge   Problem: Cardiac: Goal: Ability to achieve and maintain adequate cardiopulmonary perfusion will improve Outcome: Adequate for Discharge   Problem: Acute Rehab OT Goals (only OT should resolve) Goal: Pt. Will Perform Grooming Outcome: Adequate for Discharge Goal: Pt. Will Perform Upper Body Bathing Outcome: Adequate for Discharge Goal: Pt. Will Transfer To Toilet Outcome: Adequate for Discharge Goal: OT Additional ADL Goal #1 Outcome: Adequate for Discharge

## 2019-03-08 NOTE — Progress Notes (Signed)
Discharge   Pt is not able to participate with discharge teaching at this time. Pt given dose of Morphine (See MAR) PIV removed and pt prepared for transport to [facility].   Report called to [facility] prior to discharge.

## 2019-03-11 ENCOUNTER — Telehealth: Payer: Self-pay | Admitting: Family Medicine

## 2019-03-11 NOTE — Telephone Encounter (Signed)
Called and left message.  The death was expected.

## 2019-03-11 NOTE — Telephone Encounter (Signed)
Called and LM with my condolences.  I will leave the call encounter open to remind me to call tomorrow.

## 2019-03-11 NOTE — Telephone Encounter (Signed)
Patients wife called to let Dr. Andria Frames know that her husband, Juan Li, passed away yesterday at [facility]. She said they 'loved our office very much and that we are good people' and wanted to give [name] a 'thank you' for his care for [facility].   Wife said she is not expecting a call back from Dr. Andria Frames, she just wanted to inform him. She seems to be in very good spirits. Gave her our condolences which she appreciated.    She also wanted to let Dr. Andria Frames know that [name]'s obituary will be in the paper in the coming days.

## 2019-03-14 ENCOUNTER — Ambulatory Visit: Payer: Medicare Other | Admitting: Cardiovascular Disease

## 2019-04-02 DEATH — deceased

## 2019-12-24 ENCOUNTER — Encounter (HOSPITAL_COMMUNITY): Payer: Medicare Other
